# Patient Record
Sex: Female | Born: 1945 | Race: White | Hispanic: No | Smoking: Former smoker
Health system: Southern US, Community
[De-identification: ages and names within clinical notes are randomized; demographics above are authoritative.]

## PROBLEM LIST (undated history)

## (undated) DIAGNOSIS — I480 Paroxysmal atrial fibrillation: Secondary | ICD-10-CM

## (undated) DIAGNOSIS — I639 Cerebral infarction, unspecified: Secondary | ICD-10-CM

## (undated) DIAGNOSIS — E785 Hyperlipidemia, unspecified: Secondary | ICD-10-CM

## (undated) DIAGNOSIS — I10 Essential (primary) hypertension: Secondary | ICD-10-CM

## (undated) DIAGNOSIS — I5030 Unspecified diastolic (congestive) heart failure: Secondary | ICD-10-CM

## (undated) DIAGNOSIS — I4510 Unspecified right bundle-branch block: Secondary | ICD-10-CM

## (undated) HISTORY — DX: Cerebral infarction, unspecified: I63.9

## (undated) HISTORY — DX: Unspecified right bundle-branch block: I45.10

## (undated) HISTORY — DX: Paroxysmal atrial fibrillation: I48.0

## (undated) HISTORY — DX: Essential (primary) hypertension: I10

## (undated) HISTORY — DX: Hyperlipidemia, unspecified: E78.5

## (undated) HISTORY — DX: Unspecified diastolic (congestive) heart failure: I50.30

---

## 2021-02-25 ENCOUNTER — Other Ambulatory Visit: Payer: Self-pay

## 2021-02-25 ENCOUNTER — Other Ambulatory Visit (HOSPITAL_COMMUNITY): Payer: Medicare Other

## 2021-02-25 ENCOUNTER — Emergency Department (HOSPITAL_COMMUNITY): Payer: Medicare Other

## 2021-02-25 ENCOUNTER — Encounter (HOSPITAL_COMMUNITY): Payer: Self-pay | Admitting: Emergency Medicine

## 2021-02-25 ENCOUNTER — Inpatient Hospital Stay (HOSPITAL_COMMUNITY)
Admission: EM | Admit: 2021-02-25 | Discharge: 2021-03-07 | DRG: 023 | Disposition: A | Discharge status: Rehabilitation Facility | Payer: Medicare Other | Attending: Neurology | Admitting: Neurology

## 2021-02-25 ENCOUNTER — Emergency Department (HOSPITAL_COMMUNITY): Payer: Medicare Other | Admitting: Anesthesiology

## 2021-02-25 ENCOUNTER — Encounter (HOSPITAL_COMMUNITY)
Admission: EM | Disposition: A | Discharge status: Rehabilitation Facility | Payer: Self-pay | Source: Home / Self Care | Attending: Neurology

## 2021-02-25 DIAGNOSIS — F802 Mixed receptive-expressive language disorder: Secondary | ICD-10-CM | POA: Diagnosis present

## 2021-02-25 DIAGNOSIS — I5031 Acute diastolic (congestive) heart failure: Secondary | ICD-10-CM

## 2021-02-25 DIAGNOSIS — I081 Rheumatic disorders of both mitral and tricuspid valves: Secondary | ICD-10-CM | POA: Diagnosis present

## 2021-02-25 DIAGNOSIS — E782 Mixed hyperlipidemia: Secondary | ICD-10-CM

## 2021-02-25 DIAGNOSIS — R509 Fever, unspecified: Secondary | ICD-10-CM | POA: Diagnosis not present

## 2021-02-25 DIAGNOSIS — Z20822 Contact with and (suspected) exposure to covid-19: Secondary | ICD-10-CM | POA: Diagnosis present

## 2021-02-25 DIAGNOSIS — Z8249 Family history of ischemic heart disease and other diseases of the circulatory system: Secondary | ICD-10-CM

## 2021-02-25 DIAGNOSIS — Z6831 Body mass index (BMI) 31.0-31.9, adult: Secondary | ICD-10-CM

## 2021-02-25 DIAGNOSIS — D72829 Elevated white blood cell count, unspecified: Secondary | ICD-10-CM | POA: Diagnosis not present

## 2021-02-25 DIAGNOSIS — I480 Paroxysmal atrial fibrillation: Secondary | ICD-10-CM

## 2021-02-25 DIAGNOSIS — Z7901 Long term (current) use of anticoagulants: Secondary | ICD-10-CM | POA: Diagnosis not present

## 2021-02-25 DIAGNOSIS — I63512 Cerebral infarction due to unspecified occlusion or stenosis of left middle cerebral artery: Principal | ICD-10-CM | POA: Diagnosis present

## 2021-02-25 DIAGNOSIS — I952 Hypotension due to drugs: Secondary | ICD-10-CM | POA: Diagnosis not present

## 2021-02-25 DIAGNOSIS — R29709 NIHSS score 9: Secondary | ICD-10-CM | POA: Diagnosis not present

## 2021-02-25 DIAGNOSIS — Z888 Allergy status to other drugs, medicaments and biological substances status: Secondary | ICD-10-CM

## 2021-02-25 DIAGNOSIS — R4701 Aphasia: Secondary | ICD-10-CM | POA: Diagnosis present

## 2021-02-25 DIAGNOSIS — R233 Spontaneous ecchymoses: Secondary | ICD-10-CM | POA: Diagnosis not present

## 2021-02-25 DIAGNOSIS — R1312 Dysphagia, oropharyngeal phase: Secondary | ICD-10-CM | POA: Diagnosis not present

## 2021-02-25 DIAGNOSIS — I6340 Cerebral infarction due to embolism of unspecified cerebral artery: Secondary | ICD-10-CM | POA: Diagnosis not present

## 2021-02-25 DIAGNOSIS — I6389 Other cerebral infarction: Secondary | ICD-10-CM | POA: Diagnosis not present

## 2021-02-25 DIAGNOSIS — I9589 Other hypotension: Secondary | ICD-10-CM

## 2021-02-25 DIAGNOSIS — I4819 Other persistent atrial fibrillation: Secondary | ICD-10-CM | POA: Diagnosis present

## 2021-02-25 DIAGNOSIS — Z880 Allergy status to penicillin: Secondary | ICD-10-CM

## 2021-02-25 DIAGNOSIS — I493 Ventricular premature depolarization: Secondary | ICD-10-CM | POA: Diagnosis not present

## 2021-02-25 DIAGNOSIS — I4892 Unspecified atrial flutter: Secondary | ICD-10-CM | POA: Diagnosis present

## 2021-02-25 DIAGNOSIS — I4891 Unspecified atrial fibrillation: Secondary | ICD-10-CM | POA: Diagnosis not present

## 2021-02-25 DIAGNOSIS — R29704 NIHSS score 4: Secondary | ICD-10-CM | POA: Diagnosis present

## 2021-02-25 DIAGNOSIS — Z79899 Other long term (current) drug therapy: Secondary | ICD-10-CM | POA: Diagnosis not present

## 2021-02-25 DIAGNOSIS — I4510 Unspecified right bundle-branch block: Secondary | ICD-10-CM | POA: Diagnosis present

## 2021-02-25 DIAGNOSIS — Z886 Allergy status to analgesic agent status: Secondary | ICD-10-CM

## 2021-02-25 DIAGNOSIS — I639 Cerebral infarction, unspecified: Secondary | ICD-10-CM | POA: Diagnosis present

## 2021-02-25 DIAGNOSIS — I10 Essential (primary) hypertension: Secondary | ICD-10-CM | POA: Diagnosis not present

## 2021-02-25 DIAGNOSIS — Z8673 Personal history of transient ischemic attack (TIA), and cerebral infarction without residual deficits: Secondary | ICD-10-CM

## 2021-02-25 DIAGNOSIS — I110 Hypertensive heart disease with heart failure: Secondary | ICD-10-CM | POA: Diagnosis present

## 2021-02-25 DIAGNOSIS — J449 Chronic obstructive pulmonary disease, unspecified: Secondary | ICD-10-CM | POA: Diagnosis present

## 2021-02-25 DIAGNOSIS — E669 Obesity, unspecified: Secondary | ICD-10-CM | POA: Diagnosis present

## 2021-02-25 DIAGNOSIS — R1310 Dysphagia, unspecified: Secondary | ICD-10-CM | POA: Diagnosis present

## 2021-02-25 DIAGNOSIS — I6602 Occlusion and stenosis of left middle cerebral artery: Secondary | ICD-10-CM | POA: Diagnosis not present

## 2021-02-25 DIAGNOSIS — R059 Cough, unspecified: Secondary | ICD-10-CM | POA: Diagnosis present

## 2021-02-25 DIAGNOSIS — I483 Typical atrial flutter: Secondary | ICD-10-CM

## 2021-02-25 DIAGNOSIS — Z87891 Personal history of nicotine dependence: Secondary | ICD-10-CM

## 2021-02-25 DIAGNOSIS — E7800 Pure hypercholesterolemia, unspecified: Secondary | ICD-10-CM | POA: Diagnosis not present

## 2021-02-25 DIAGNOSIS — Z823 Family history of stroke: Secondary | ICD-10-CM

## 2021-02-25 HISTORY — PX: RADIOLOGY WITH ANESTHESIA: SHX6223

## 2021-02-25 HISTORY — PX: IR PERCUTANEOUS ART THROMBECTOMY/INFUSION INTRACRANIAL INC DIAG ANGIO: IMG6087

## 2021-02-25 HISTORY — PX: IR CT HEAD LTD: IMG2386

## 2021-02-25 LAB — DIFFERENTIAL
Abs Immature Granulocytes: 0.04 10*3/uL (ref 0.00–0.07)
Basophils Absolute: 0.1 10*3/uL (ref 0.0–0.1)
Basophils Relative: 1 %
Eosinophils Absolute: 0.1 10*3/uL (ref 0.0–0.5)
Eosinophils Relative: 1 %
Immature Granulocytes: 0 %
Lymphocytes Relative: 25 %
Lymphs Abs: 2.5 10*3/uL (ref 0.7–4.0)
Monocytes Absolute: 1 10*3/uL (ref 0.1–1.0)
Monocytes Relative: 11 %
Neutro Abs: 6.2 10*3/uL (ref 1.7–7.7)
Neutrophils Relative %: 62 %

## 2021-02-25 LAB — COMPREHENSIVE METABOLIC PANEL
ALT: 14 U/L (ref 0–44)
AST: 20 U/L (ref 15–41)
Albumin: 3.9 g/dL (ref 3.5–5.0)
Alkaline Phosphatase: 56 U/L (ref 38–126)
Anion gap: 8 (ref 5–15)
BUN: 21 mg/dL (ref 8–23)
CO2: 26 mmol/L (ref 22–32)
Calcium: 8.8 mg/dL — ABNORMAL LOW (ref 8.9–10.3)
Chloride: 107 mmol/L (ref 98–111)
Creatinine, Ser: 0.89 mg/dL (ref 0.44–1.00)
GFR, Estimated: >60 mL/min (ref >60)
Glucose, Bld: 113 mg/dL — ABNORMAL HIGH (ref 70–99)
Potassium: 3.4 mmol/L — ABNORMAL LOW (ref 3.5–5.1)
Sodium: 141 mmol/L (ref 135–145)
Total Bilirubin: 0.6 mg/dL (ref 0.3–1.2)
Total Protein: 6.8 g/dL (ref 6.5–8.1)

## 2021-02-25 LAB — CBC
HCT: 43.6 % (ref 36.0–46.0)
Hemoglobin: 13.6 g/dL (ref 12.0–15.0)
MCH: 29.9 pg (ref 26.0–34.0)
MCHC: 31.2 g/dL (ref 30.0–36.0)
MCV: 95.8 fL (ref 80.0–100.0)
Platelets: 225 10*3/uL (ref 150–400)
RBC: 4.55 MIL/uL (ref 3.87–5.11)
RDW: 13.8 % (ref 11.5–15.5)
WBC: 9.9 10*3/uL (ref 4.0–10.5)
nRBC: 0 % (ref 0.0–0.2)

## 2021-02-25 LAB — CBG MONITORING, ED: Glucose-Capillary: 99 mg/dL (ref 70–99)

## 2021-02-25 LAB — RESP PANEL BY RT-PCR (FLU A&B, COVID) ARPGX2
Influenza A by PCR: NEGATIVE
Influenza B by PCR: NEGATIVE
SARS Coronavirus 2 by RT PCR: NEGATIVE

## 2021-02-25 LAB — ETHANOL: Alcohol, Ethyl (B): <10 mg/dL (ref <10)

## 2021-02-25 LAB — PROTIME-INR
INR: 1 (ref 0.8–1.2)
Prothrombin Time: 13 seconds (ref 11.4–15.2)

## 2021-02-25 LAB — MRSA NEXT GEN BY PCR, NASAL: MRSA by PCR Next Gen: NOT DETECTED

## 2021-02-25 LAB — APTT: aPTT: 23 seconds — ABNORMAL LOW (ref 24–36)

## 2021-02-25 SURGERY — IR WITH ANESTHESIA
Anesthesia: General

## 2021-02-25 MED ORDER — LIDOCAINE 2% (20 MG/ML) 5 ML SYRINGE
INTRAMUSCULAR | Status: DC | PRN
  Administered 2021-02-25: 60 mg via INTRAVENOUS

## 2021-02-25 MED ORDER — IOHEXOL 300 MG/ML  SOLN
100.0000 mL | Freq: Once | INTRAMUSCULAR | Status: AC | PRN
  Administered 2021-02-25: 96 mL via INTRA_ARTERIAL

## 2021-02-25 MED ORDER — MOMETASONE FURO-FORMOTEROL FUM 100-5 MCG/ACT IN AERO
2.0000 | INHALATION_SPRAY | Freq: Two times a day (BID) | RESPIRATORY_TRACT | Status: DC
  Administered 2021-02-25 – 2021-03-06 (×16): 2 via RESPIRATORY_TRACT
  Filled 2021-02-25 (×2): qty 8.8

## 2021-02-25 MED ORDER — ACETAMINOPHEN 160 MG/5ML PO SOLN: 650.0000 mg | ORAL | Status: DC | PRN

## 2021-02-25 MED ORDER — CLEVIDIPINE BUTYRATE 0.5 MG/ML IV EMUL
0.0000 mg/h | INTRAVENOUS | Status: DC
  Administered 2021-02-25: 2 mg/h via INTRAVENOUS
  Filled 2021-02-25: qty 50

## 2021-02-25 MED ORDER — TIOTROPIUM BROMIDE MONOHYDRATE 18 MCG IN CAPS
18.0000 ug | ORAL_CAPSULE | Freq: Every day | RESPIRATORY_TRACT | Status: DC
  Filled 2021-02-25: qty 5

## 2021-02-25 MED ORDER — ONDANSETRON HCL 4 MG/2ML IJ SOLN
INTRAMUSCULAR | Status: DC | PRN
  Administered 2021-02-25: 4 mg via INTRAVENOUS

## 2021-02-25 MED ORDER — SUCCINYLCHOLINE CHLORIDE 200 MG/10ML IV SOSY
PREFILLED_SYRINGE | INTRAVENOUS | Status: DC | PRN
  Administered 2021-02-25: 120 mg via INTRAVENOUS

## 2021-02-25 MED ORDER — LOSARTAN POTASSIUM 50 MG PO TABS
25.0000 mg | ORAL_TABLET | Freq: Every day | ORAL | Status: DC
  Filled 2021-02-25: qty 1

## 2021-02-25 MED ORDER — VANCOMYCIN HCL IN DEXTROSE 1-5 GM/200ML-% IV SOLN
INTRAVENOUS | Status: AC
  Filled 2021-02-25: qty 200

## 2021-02-25 MED ORDER — LABETALOL HCL 5 MG/ML IV SOLN
10.0000 mg | INTRAVENOUS | Status: DC | PRN
  Administered 2021-02-25: 10 mg via INTRAVENOUS
  Filled 2021-02-25: qty 4

## 2021-02-25 MED ORDER — SODIUM CHLORIDE 0.9 % IV SOLN: INTRAVENOUS | Status: DC

## 2021-02-25 MED ORDER — IOHEXOL 350 MG/ML SOLN
100.0000 mL | Freq: Once | INTRAVENOUS | Status: AC | PRN
  Administered 2021-02-25: 100 mL via INTRAVENOUS

## 2021-02-25 MED ORDER — SUGAMMADEX SODIUM 200 MG/2ML IV SOLN
INTRAVENOUS | Status: DC | PRN
  Administered 2021-02-25: 200 mg via INTRAVENOUS

## 2021-02-25 MED ORDER — ATORVASTATIN CALCIUM 10 MG PO TABS
10.0000 mg | ORAL_TABLET | Freq: Every day | ORAL | Status: DC
  Administered 2021-02-26 – 2021-03-07 (×10): 10 mg via ORAL
  Filled 2021-02-25 (×10): qty 1

## 2021-02-25 MED ORDER — FENTANYL CITRATE (PF) 100 MCG/2ML IJ SOLN
INTRAMUSCULAR | Status: DC | PRN
  Administered 2021-02-25 (×2): 50 ug via INTRAVENOUS

## 2021-02-25 MED ORDER — PROPOFOL 10 MG/ML IV BOLUS
INTRAVENOUS | Status: DC | PRN
  Administered 2021-02-25: 100 mg via INTRAVENOUS

## 2021-02-25 MED ORDER — CHLORHEXIDINE GLUCONATE CLOTH 2 % EX PADS
6.0000 | MEDICATED_PAD | Freq: Every day | CUTANEOUS | Status: DC
  Administered 2021-02-25 – 2021-03-06 (×8): 6 via TOPICAL

## 2021-02-25 MED ORDER — FENTANYL CITRATE (PF) 100 MCG/2ML IJ SOLN
INTRAMUSCULAR | Status: AC
  Filled 2021-02-25: qty 2

## 2021-02-25 MED ORDER — STROKE: EARLY STAGES OF RECOVERY BOOK: Freq: Once | Status: AC

## 2021-02-25 MED ORDER — ALBUTEROL SULFATE (2.5 MG/3ML) 0.083% IN NEBU
2.5000 mg | INHALATION_SOLUTION | RESPIRATORY_TRACT | Status: DC | PRN
  Administered 2021-03-01 – 2021-03-05 (×2): 2.5 mg via RESPIRATORY_TRACT
  Filled 2021-02-25 (×2): qty 3

## 2021-02-25 MED ORDER — ROCURONIUM BROMIDE 10 MG/ML (PF) SYRINGE
PREFILLED_SYRINGE | INTRAVENOUS | Status: DC | PRN
  Administered 2021-02-25: 50 mg via INTRAVENOUS
  Administered 2021-02-25: 10 mg via INTRAVENOUS

## 2021-02-25 MED ORDER — LACTATED RINGERS IV SOLN: INTRAVENOUS | Status: DC | PRN

## 2021-02-25 MED ORDER — ACETAMINOPHEN 325 MG PO TABS
650.0000 mg | ORAL_TABLET | ORAL | Status: DC | PRN
  Administered 2021-03-01: 650 mg via ORAL
  Filled 2021-02-25: qty 2

## 2021-02-25 MED ORDER — CLEVIDIPINE BUTYRATE 0.5 MG/ML IV EMUL
INTRAVENOUS | Status: AC
  Filled 2021-02-25: qty 50

## 2021-02-25 MED ORDER — UMECLIDINIUM BROMIDE 62.5 MCG/INH IN AEPB
1.0000 | INHALATION_SPRAY | Freq: Every day | RESPIRATORY_TRACT | Status: DC
  Administered 2021-02-26 – 2021-03-04 (×7): 1 via RESPIRATORY_TRACT
  Filled 2021-02-25: qty 7

## 2021-02-25 MED ORDER — PANTOPRAZOLE SODIUM 40 MG IV SOLR
40.0000 mg | Freq: Every day | INTRAVENOUS | Status: DC
  Administered 2021-02-25: 40 mg via INTRAVENOUS
  Filled 2021-02-25: qty 40

## 2021-02-25 MED ORDER — DEXAMETHASONE SODIUM PHOSPHATE 10 MG/ML IJ SOLN
INTRAMUSCULAR | Status: DC | PRN
  Administered 2021-02-25: 10 mg via INTRAVENOUS

## 2021-02-25 MED ORDER — TENECTEPLASE FOR STROKE: 0.2500 mg/kg | PACK | Freq: Once | INTRAVENOUS | Status: AC

## 2021-02-25 MED ORDER — VANCOMYCIN HCL 1000 MG IV SOLR
INTRAVENOUS | Status: DC | PRN
  Administered 2021-02-25: 1000 mg via INTRAVENOUS

## 2021-02-25 MED ORDER — NITROGLYCERIN 1 MG/10 ML FOR IR/CATH LAB
INTRA_ARTERIAL | Status: AC
  Filled 2021-02-25: qty 10

## 2021-02-25 MED ORDER — PHENYLEPHRINE 40 MCG/ML (10ML) SYRINGE FOR IV PUSH (FOR BLOOD PRESSURE SUPPORT)
PREFILLED_SYRINGE | INTRAVENOUS | Status: DC | PRN
  Administered 2021-02-25: 160 ug via INTRAVENOUS

## 2021-02-25 MED ORDER — PHENYLEPHRINE HCL-NACL 20-0.9 MG/250ML-% IV SOLN
INTRAVENOUS | Status: DC | PRN
  Administered 2021-02-25: 25 ug/min via INTRAVENOUS

## 2021-02-25 MED ORDER — TENECTEPLASE FOR STROKE: 0.2500 mg/kg | PACK | Freq: Once | INTRAVENOUS | Status: DC

## 2021-02-25 MED ORDER — SENNOSIDES-DOCUSATE SODIUM 8.6-50 MG PO TABS: 1.0000 | ORAL_TABLET | Freq: Every evening | ORAL | Status: DC | PRN

## 2021-02-25 MED ORDER — ACETAMINOPHEN 325 MG PO TABS: 650.0000 mg | ORAL_TABLET | ORAL | Status: DC | PRN

## 2021-02-25 MED ORDER — ACETAMINOPHEN 650 MG RE SUPP: 650.0000 mg | RECTAL | Status: DC | PRN

## 2021-02-25 MED ORDER — TENECTEPLASE FOR STROKE
PACK | INTRAVENOUS | Status: AC
  Administered 2021-02-25: 23 mg via INTRAVENOUS
  Filled 2021-02-25: qty 10

## 2021-02-25 MED ORDER — CLEVIDIPINE BUTYRATE 0.5 MG/ML IV EMUL
0.0000 mg/h | INTRAVENOUS | Status: AC
  Administered 2021-02-25: 2 mg/h via INTRAVENOUS
  Administered 2021-02-25: 4 mg/h via INTRAVENOUS
  Administered 2021-02-26: 6 mg/h via INTRAVENOUS
  Administered 2021-02-26: 8 mg/h via INTRAVENOUS
  Filled 2021-02-25 (×4): qty 50

## 2021-02-25 MED ORDER — SODIUM CHLORIDE (PF) 0.9 % IJ SOLN
INTRAVENOUS | Status: DC | PRN
  Administered 2021-02-25 (×3): 25 ug via INTRA_ARTERIAL

## 2021-02-25 NOTE — ED Provider Notes (Signed)
  Physical Exam  BP (!) 180/90   Pulse 86   Temp 98.7 F (37.1 C) (Oral)   Resp 18   Ht 5\' 7"  (1.702 m)   Wt 90.2 kg   SpO2 96%   BMI 31.15 kg/m   Physical Exam  ED Course/Procedures     .Critical Care Performed by: , MD Authorized by: Derwood Kaplan, MD   Critical care provider statement:    Critical care time (minutes):  31   Critical care time was exclusive of:  Separately billable procedures and treating other patients   Critical care was necessary to treat or prevent imminent or life-threatening deterioration of the following conditions:  CNS failure or compromise and circulatory failure   Critical care was time spent personally by me on the following activities:  Development of treatment plan with patient or surrogate, discussions with consultants, evaluation of patient's response to treatment, examination of patient, re-evaluation of patient's condition and pulse oximetry   I assumed direction of critical care for this patient from another provider in my specialty: no     Care discussed with: accepting provider at another facility    MDM   Assuming care of patient from Dr. Derwood Kaplan at 3:40 pm   Patient in the ED for aphasia, LKN: 1:30 pm. Concerns for stroke - TNK given. CT-A pending.  According to Dr. Wallace Cullens, plan is to f/u on Ct-A. Will need BP closely monitored as well.   Patient had no complains, no concerns from the nursing side. Will continue to monitor.  4:17 PM Reviewed the CT angiogram independently.  Discussed the case with neurologist as well.  Dr. Wallace Cullens, informed me that patient will need stat transfer to [facility] for clot extraction from M2 distribution.  Reassessed the patient.  Her BP is 182/95.  IV labetalol to be administered by nurse.  Requested that Cleviprex be at the bedside available as well. Patient's son is at the bedside now.  We have updated him of the   [facility] unable to transfer the patient immediately. Requested  [facility] to call EMS service, they will now take the patient to [facility].  Dr. [facility] at [facility] made aware along with the charge nurse.          [facility], MD 02/25/21 [phone]

## 2021-02-25 NOTE — Progress Notes (Signed)
Patient ID: Michelle Dean, female   DOB: July 13, 1945, 75 y.o.   MRN: [id number] INR 74 Y RT H F LSW 1pm  MRS 0. New onset aphasia .  CT brain No ICH ,. 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 CTP  core of 17ml with mismatch of 55ml . NIH of 9. Option of endovascular treatment  of occluded artery D/W son via a 3 way call . Procedure,reasons and alternatives reviewed.  Risks of ICH of 10 % ,worsening neuro deficit ,death ,inability  to revascularize reviewed. Son expressed understanding and gave consent to the treatment. S.Lajuana Patchell MD  .

## 2021-02-25 NOTE — Anesthesia Preprocedure Evaluation (Signed)
Anesthesia Evaluation  Patient identified by MRN, date of birth, ID band Patient confused    Reviewed: Allergy & Precautions, H&P , NPO status , Patient's Chart, lab work & pertinent test results  Airway Mallampati: II  TM Distance: >3 FB Neck ROM: Full    Dental no notable dental hx. (+) Edentulous Upper, Edentulous Lower, Dental Advisory Given   Pulmonary neg pulmonary ROS,    Pulmonary exam normal breath sounds clear to auscultation       Cardiovascular hypertension,  Rhythm:Regular Rate:Normal     Neuro/Psych CVA, Residual Symptoms negative psych ROS   GI/Hepatic negative GI ROS, Neg liver ROS,   Endo/Other  negative endocrine ROS  Renal/GU negative Renal ROS  negative genitourinary   Musculoskeletal   Abdominal   Peds  Hematology negative hematology ROS (+)   Anesthesia Other Findings   Reproductive/Obstetrics negative OB ROS                             Anesthesia Physical Anesthesia Plan  ASA: 3 and emergent  Anesthesia Plan: General   Post-op Pain Management:    Induction: Intravenous, Rapid sequence and Cricoid pressure planned  PONV Risk Score and Plan: 4 or greater and Ondansetron, Dexamethasone and Treatment may vary due to age or medical condition  Airway Management Planned: Oral ETT  Additional Equipment: Arterial line  Intra-op Plan:   Post-operative Plan: Extubation in OR and Possible Post-op intubation/ventilation  Informed Consent: I have reviewed the patients History and Physical, chart, labs and discussed the procedure including the risks, benefits and alternatives for the proposed anesthesia with the patient or authorized representative who has indicated his/her understanding and acceptance.     Dental advisory given  Plan Discussed with: CRNA  Anesthesia Plan Comments:         Anesthesia Quick Evaluation

## 2021-02-25 NOTE — ED Triage Notes (Signed)
REMS for stroke . Pt was at sons house and became confused around 1pm. LKW 1pm per EMS.

## 2021-02-25 NOTE — Code Documentation (Signed)
Stroke Response Nurse Documentation Code Documentation  Addendum   Stroke team at the bedside on patient arrival from [facility] via [facility].   Patient alert, difficult to communicate due to expressive aphasia, BP 140/85.  Patient to IR bay, jewelry removed and placed in patient belonging bag with patient sticker. Report given to IR RN.    Scarlette Slice K  Stroke Response RN

## 2021-02-25 NOTE — Anesthesia Procedure Notes (Signed)
Procedure Name: Intubation Date/Time: 02/25/2021 5:17 PM Performed by: Barrington Ellison, CRNA Pre-anesthesia Checklist: Patient identified, Emergency Drugs available, Suction available and Patient being monitored Patient Re-evaluated:Patient Re-evaluated prior to induction Oxygen Delivery Method: Circle System Utilized Preoxygenation: Pre-oxygenation with 100% oxygen Induction Type: IV induction, Rapid sequence and Cricoid Pressure applied Laryngoscope Size: Mac and 3 Grade View: Grade I Tube type: Oral Tube size: 7.5 mm Number of attempts: 1 Airway Equipment and Method: Stylet and Oral airway Placement Confirmation: ETT inserted through vocal cords under direct vision, positive ETCO2 and breath sounds checked- equal and bilateral Secured at: 21 cm Tube secured with: Tape Dental Injury: Teeth and Oropharynx as per pre-operative assessment

## 2021-02-25 NOTE — Procedures (Signed)
S/P Lt common carotid arteriogram RT CFA approach. Occluded LT MCA inf division occlusion at the Lt M2 M3 junction. Partial recanalization with reocclusion with x 1 pass with 40 mm solitaireX retriever and contact aspiration,x1 pass with contact aspiration and x1 pass with mechanical thrombolysis  acieving a TICI 2C revascularization. Post CT mod hyper attenuation in the Lt ant sylvian fissure and the fronto parietal subarachnoid space . No mass effect. 4F angioseal and quick clot applied for hemostasis at the RT CFAS site, Distal pulses all present  Patient extubated.Mainting O2 sats. Pupils 54mm Rt = Lt  No facial asymmetry. Tongue midline. RT groin soft. Distal pulses intact. S.Michelle Fittro MD Not following simple commands. Moves all 4s equally.  S.Michelle Repsher MD

## 2021-02-25 NOTE — Consult Note (Signed)
[facility] Telemedicine Consult   Requesting Provider: Thereasa Solo Consult Participants: Myself, [facility] nurse Deanna, Bedside nurse Bonita Quin, Son Juanito Doom Location of the provider: [facility] Location of the patient: [facility]  This consult was provided via telemedicine with 2-way video and audio communication. The patient/family was informed that care would be provided in this way and agreed to receive care in this manner.    Chief Complaint: Aphasia  HPI: This is a 75 year old woman with past medical history significant for COPD (intermittently uses oxygen at home), hypertension, hyperlipidemia, presenting with acute onset difficulty understanding speech.  Given her substantial aphasia, history is obtained primarily from her son at bedside, there is no information accessible to me in her chart at this time.  He reports that he had a conversation with her at 10:41 AM and she was in her normal state of health, she then came to his home at about 1 PM, driving herself with a gift for her grandson and a loaf of bread.  Initially her speech was completely normal but then suddenly she became very confused and could not understand what he was telling her about where some of the patient's grandsons belongings were.  This was a sudden acute and witnessed change with last known well at 1 PM.  On review of systems is obtained from the son who notes that she frequently will not use her oxygen when she is supposed to, for example doing things like mowing her lawn without her oxygen in place, and in that setting she will sometimes have headaches.  Otherwise denies any acute complaints on full review of systems.  Denies any contraindications to [facility] administration and expresses understanding of the risk of hemorrhage.  LKW: 1 PM TNK given?:  Yes, after full review of risks and benefits with the son and confirmation of no exclusion criteria, given at 3:29 PM IR Thrombectomy? Yes,  transferring to [facility] Modified Rankin Scale: 0-Completely asymptomatic and back to baseline post- stroke Time of teleneurologist evaluation: 2:52 PM  Exam: Current vital signs: BP (!) 161/107   Pulse 86   Temp 98.7 F (37.1 C) (Oral)   Resp (!) 25   Ht 5\' 7"  (1.702 m)   Wt 90.2 kg   SpO2 97%   BMI 31.15 kg/m  Vital signs in last 24 hours: Temp:  [98.7 F (37.1 C)] 98.7 F (37.1 C) (10/17 1451) Pulse Rate:  [86] 86 (10/17 1500) Resp:  [25] 25 (10/17 1500) BP: (161)/(107) 161/107 (10/17 1500) SpO2:  [97 %] 97 % (10/17 1500) Weight:  [90.2 kg-99.8 kg] 90.2 kg (10/17 1514)    General: Alert, comfortable, interactive with examiners but clearly not understanding verbal communication Pulmonary: breathing comfortably Cardiac: regular rate and rhythm on monitor   NIH Stroke scale 1A: Level of Consciousness - 0 1B: Ask Month and Age - 2 1C: 'Blink Eyes' & 'Squeeze Hands' - 2 2: Test Horizontal Extraocular Movements - 0 3: Test Visual Fields - 0 to orienting to stimuli, blinking to threat, difficult to assess given aphasia and tele-exam 4: Test Facial Palsy - 0 5A: Test Left Arm Motor Drift - 0 5B: Test Right Arm Motor Drift - 0 6A: Test Left Leg Motor Drift - 1 6B: Test Right Leg Motor Drift - 1  7: Test Limb Ataxia - 0  8: Test Sensation - 0 9: Test Language/Aphasia- 2 10: Test Dysarthria - 0 11: Test Extinction/Inattention - 0 NIHSS score: 9 Regarding her speech, she will occasionally spontaneously say  short sentences such as "who is going to take care of my dog" and "what is that" but she has a dense receptive aphasia.  Her speech is not slurred when she does speak   Imaging Reviewed:  Head CT without acute intracranial process, ASPECTS 10, personally reviewed and agree with neuroradiology CTA  with  Labs reviewed in epic and pertinent values follow:  Basic Metabolic Panel: Recent Labs  Lab 02/25/21 1441  NA 141  K 3.4*  CL 107  CO2 26  GLUCOSE 113*  BUN 21   CREATININE 0.89  CALCIUM 8.8*    CBC: Recent Labs  Lab 02/25/21 1441  WBC 9.9  NEUTROABS 6.2  HGB 13.6  HCT 43.6  MCV 95.8  PLT 225    Coagulation Studies: Recent Labs    02/25/21 1441  LABPROT 13.0  INR 1.0       Assessment: Acute embolic stroke  Recommendations:  #Left MCA distal M2 stroke, currently embolic of undetermined etiology - Emergent transfer to [facility] for thrombectomy, consent obtained from son via three-way conversation with myself, Dr. Corliss Skains and Mr. Juanito Doom [phone]) - Risk factor modification - Telemetry monitoring - Blood pressure goal   - Post TNK pending procedure < 180/105  - Post successful uncomplicated revascularization SBP 120 - 140 for 24 hours; if complications have arisen or only partial revascularization reach out to interventionalist or neurologist on call for BP goal - PT consult, OT consult, Speech consult, unless patient is back to baseline - Stroke team to follow  This patient is receiving care for possible acute neurological changes. There was 85 minutes of care by this provider at the time of service, including time for direct evaluation via telemedicine, review of medical records, imaging studies and discussion of findings with providers, the patient and/or family.  Brooke Dare MD-PhD [facility] [phone] If 8pm-8am, please page neurology on call as listed in AMION.  CRITICAL CARE Performed by: Gordy Councilman Total critical care time: 85 minutes Critical care time was exclusive of separately billable procedures and treating other patients. Critical care was necessary to treat or prevent imminent or life-threatening deterioration. Critical care was time spent personally by me on the following activities: development of treatment plan with patient and/or surrogate as well as nursing, discussions with consultants, evaluation of patient's response to treatment, examination of patient,  obtaining history from patient or surrogate, ordering and performing treatments and interventions, ordering and review of laboratory studies, ordering and review of radiographic studies, pulse oximetry and re-evaluation of patient's condition.

## 2021-02-25 NOTE — H&P (Signed)
Admission H&P    Chief Complaint: Acute onset of expressive and receptive aphasia  HPI: Michelle Dean is an 75 y.o. female with a PMHx of COPD, HTN and HLD who presented to the [facility] this afternoon for evaluation of acute onset confusion. She was at her son's house at symptom onset. LKN was 1 PM. Teleneurology evaluated the patient and exam findings were most consistent with an aphasia. CT head showed no hemorrhage and there were no contraindications to TNK, which was administered at the [facility]. CTA revealed a left M2/M3 occlusion and decision was made to transfer to [facility] [address] for 4 vessel angiogram and possible thrombectormy. On arrival to the [facility] ED, the patient continued to be aphasic.   Dr. Rollene Fare teleneurology note was reviewed: "This is a 75 year old woman with past medical history significant for COPD (intermittently uses oxygen at home), hypertension, hyperlipidemia, presenting with acute onset difficulty understanding speech.  Given her substantial aphasia, history is obtained primarily from her son at bedside, there is no information accessible to me in her chart at this time. He reports that he had a conversation with her at 10:41 AM and she was in her normal state of health, she then came to his home at about 1 PM, driving herself with a gift for her grandson and a loaf of bread.  Initially her speech was completely normal but then suddenly she became very confused and could not understand what he was telling her about where some of the patient's grandsons belongings were.  This was a sudden acute and witnessed change with last known well at 1 PM. On review of systems is obtained from the son who notes that she frequently will not use her oxygen when she is supposed to, for example doing things like mowing her lawn without her oxygen in place, and in that setting she will sometimes have headaches.  Otherwise denies any acute complaints on full review of systems.  Denies any contraindications to [facility]  administration and expresses understanding of the risk of hemorrhage."   LSN: 1 PM TNK Given: Yes  PMHx COPD HTN HLD  History reviewed. No pertinent surgical history.  History reviewed. No pertinent family history. Social History:  has no history on file for tobacco use, alcohol use, and drug use.  Allergies:  Allergies  Allergen Reactions   Penicillins Hives and Rash   Lisinopril Cough   No current facility-administered medications on file prior to encounter.   Current Outpatient Medications on File Prior to Encounter  Medication Sig Dispense Refill   albuterol (PROVENTIL) (2.5 MG/3ML) 0.083% nebulizer solution Take 3 mLs (2.5 mg dose) by nebulization every 4 (four) hours as needed for Wheezing.     atorvastatin (LIPITOR) 10 MG tablet Take 10 mg by mouth daily.     carboxymethylcellulose (REFRESH PLUS) 0.5 % SOLN Place 1 drop into both eyes 4 (four) times a day as needed. For dry eyes.     DULERA 100-5 MCG/ACT AERO Inhale 2 puffs into the lungs 2 (two) times daily.     losartan (COZAAR) 25 MG tablet Take 25 mg by mouth daily.     Multiple Vitamin (MULTIVITAMIN) tablet Take by mouth.     omeprazole (PRILOSEC) 20 MG capsule Take 20 mg by mouth daily.     tiotropium (SPIRIVA HANDIHALER) 18 MCG inhalation capsule PLACE 1 DOSE(CAPSULE) IN INHALER AND INHALE ONCE DAILY AS DIRECTED.     doxycycline (VIBRAMYCIN) 100 MG capsule Take 100 mg by mouth 2 (two) times daily. (Patient  not taking: Reported on 02/25/2021)      ROS: The patient is unable to provide a ROS due to her aphasia.   Physical Examination: Blood pressure 140/85, pulse 89, temperature 98.9 F (37.2 C), temperature source Oral, resp. rate 19, height 5\' 7"  (1.702 m), weight 90.2 kg, SpO2 96 %.  HEENT-  [place]/AT  Cardiovascular - RRR Lungs - CTA Abdomen - Nondistended Extremities - No edema  Neurologic Examination: Mental Status: Awake and alert. Dense receptive and expressive aphasia. Does not exhibit comprehension  of any verbal commands - could perform about 20% commands after pantomiming. Perseverative 1-2 word phrases in reply to some questions, having no relationship to the questions asked - the replies are non-dysarthric. No hemineglect noted.  Cranial Nerves: II:  PERRL. Blinks to threat in temporal visual fields of left and right eye.  III,IV, VI: No ptosis. EOMI. No nystagmus. No forced gaze deviation.  V: Reacts to touch bilaterally VII: No facial droop noted.  VIII: Alerts to voice.  IX,X: No hypophonia XI: Symmertric XII: Did not protrude tongue to command Motor: When elevated, BUE do not drift for > 10 seconds. Does not comprehend commands for testing of strength against resistance.  Withdraws BLE equally to noxious. Each lower extremity remains elevated for > 5 seconds without drift after passive elevation by examiner.  Sensory: Reacts to touch x 4 Deep Tendon Reflexes:  2+ bilateral brachioradialis and biceps Plantars: Right: downgoing   Left: downgoing Cerebellar: Unable to formally assess. No gross ataxia with spontaneous movements.  Gait: Unable to assess   Results for orders placed or performed during the hospital encounter of 02/25/21 (from the past 48 hour(s))  CBG monitoring, ED     Status: None   Collection Time: 02/25/21  2:20 PM  Result Value Ref Range   Glucose-Capillary 99 70 - 99 mg/dL    Comment: Glucose reference range applies only to samples taken after fasting for at least 8 hours.  Ethanol     Status: None   Collection Time: 02/25/21  2:41 PM  Result Value Ref Range   Alcohol, Ethyl (B) <10 <10 mg/dL    Comment: (NOTE) Lowest detectable limit for serum alcohol is 10 mg/dL.  For medical purposes only. Performed at [facility], [address], [place], [place]   Protime-INR     Status: None   Collection Time: 02/25/21  2:41 PM  Result Value Ref Range   Prothrombin Time 13.0 11.4 - 15.2 seconds   INR 1.0 0.8 - 1.2    Comment: (NOTE) INR goal  varies based on device and disease states. Performed at [facility], [address], [place], [place]   APTT     Status: Abnormal   Collection Time: 02/25/21  2:41 PM  Result Value Ref Range   aPTT 23 (L) 24 - 36 seconds    Comment: Performed at [facility], [address] [place], [place]  CBC     Status: None   Collection Time: 02/25/21  2:41 PM  Result Value Ref Range   WBC 9.9 4.0 - 10.5 K/uL   RBC 4.55 3.87 - 5.11 MIL/uL   Hemoglobin 13.6 12.0 - 15.0 g/dL   HCT 02/27/21 63.8 - 75.6 %   MCV 95.8 80.0 - 100.0 fL   MCH 29.9 26.0 - 34.0 pg   MCHC 31.2 30.0 - 36.0 g/dL   RDW 43.3 29.5 - 18.8 %   Platelets 225 150 - 400 K/uL  nRBC 0.0 0.0 - 0.2 %    Comment: Performed at [facility], [address], [place], [place] [address]  Differential     Status: None   Collection Time: 02/25/21  2:41 PM  Result Value Ref Range   Neutrophils Relative % 62 %   Neutro Abs 6.2 1.7 - 7.7 K/uL   Lymphocytes Relative 25 %   Lymphs Abs 2.5 0.7 - 4.0 K/uL   Monocytes Relative 11 %   Monocytes Absolute 1.0 0.1 - 1.0 K/uL   Eosinophils Relative 1 %   Eosinophils Absolute 0.1 0.0 - 0.5 K/uL   Basophils Relative 1 %   Basophils Absolute 0.1 0.0 - 0.1 K/uL   Immature Granulocytes 0 %   Abs Immature Granulocytes 0.04 0.00 - 0.07 K/uL    Comment: Performed at [facility], [address], [place], [place] [address]  Comprehensive metabolic panel     Status: Abnormal   Collection Time: 02/25/21  2:41 PM  Result Value Ref Range   Sodium 141 135 - 145 mmol/L   Potassium 3.4 (L) 3.5 - 5.1 mmol/L   Chloride 107 98 - 111 mmol/L   CO2 26 22 - 32 mmol/L   Glucose, Bld 113 (H) 70 - 99 mg/dL    Comment: Glucose reference range applies only to samples taken after fasting for at least 8 hours.   BUN 21 8 - 23 mg/dL   Creatinine, Ser 3.47 0.44 - 1.00 mg/dL   Calcium 8.8 (L) 8.9 - 10.3 mg/dL   Total Protein 6.8 6.5 - 8.1 g/dL   Albumin 3.9 3.5 - 5.0 g/dL   AST 20 15 - 41 U/L   ALT 14 0 -  44 U/L   Alkaline Phosphatase 56 38 - 126 U/L   Total Bilirubin 0.6 0.3 - 1.2 mg/dL   GFR, Estimated >42 >59 mL/min    Comment: (NOTE) Calculated using the CKD-EPI Creatinine Equation (2021)    Anion gap 8 5 - 15    Comment: Performed at [facility], [address], [place], [place] [address]  Resp Panel by RT-PCR (Flu A&B, Covid) Nasopharyngeal Swab     Status: None   Collection Time: 02/25/21  2:55 PM   Specimen: Nasopharyngeal Swab; Nasopharyngeal(NP) swabs in vial transport medium  Result Value Ref Range   SARS Coronavirus 2 by RT PCR NEGATIVE NEGATIVE    Comment: (NOTE) SARS-CoV-2 target nucleic acids are NOT DETECTED.  The SARS-CoV-2 RNA is generally detectable in upper respiratory specimens during the acute phase of infection. The lowest concentration of SARS-CoV-2 viral copies this assay can detect is 138 copies/mL. A negative result does not preclude SARS-Cov-2 infection and should not be used as the sole basis for treatment or other patient management decisions. A negative result may occur with  improper specimen collection/handling, submission of specimen other than nasopharyngeal swab, presence of viral mutation(s) within the areas targeted by this assay, and inadequate number of viral copies(<138 copies/mL). A negative result must be combined with clinical observations, patient history, and epidemiological information. The expected result is Negative.  Fact Sheet for Patients:  BloggerCourse.com  Fact Sheet for Healthcare Providers:  SeriousBroker.it  This test is no t yet approved or cleared by the [place] FDA and  has been authorized for detection and/or diagnosis of SARS-CoV-2 by FDA under an Emergency Use Authorization (EUA). This EUA will remain  in effect (meaning this test can be used) for the duration of the COVID-19 declaration under Section 564(b)(1) of the  Act, 21 U.S.C.section 360bbb-3(b)(1),  unless the authorization is terminated  or revoked sooner.       Influenza A by PCR NEGATIVE NEGATIVE   Influenza B by PCR NEGATIVE NEGATIVE    Comment: (NOTE) The Xpert Xpress SARS-CoV-2/FLU/RSV plus assay is intended as an aid in the diagnosis of influenza from Nasopharyngeal swab specimens and should not be used as a sole basis for treatment. Nasal washings and aspirates are unacceptable for Xpert Xpress SARS-CoV-2/FLU/RSV testing.  Fact Sheet for Patients: BloggerCourse.com  Fact Sheet for Healthcare Providers: SeriousBroker.it  This test is not yet approved or cleared by the [place] FDA and has been authorized for detection and/or diagnosis of SARS-CoV-2 by FDA under an Emergency Use Authorization (EUA). This EUA will remain in effect (meaning this test can be used) for the duration of the COVID-19 declaration under Section 564(b)(1) of the Act, 21 U.S.C. section 360bbb-3(b)(1), unless the authorization is terminated or revoked.  Performed at [facility], [address], [place], [place] [address]    CT HEAD CODE STROKE WO CONTRAST  Result Date: 02/25/2021 CLINICAL DATA:  Code stroke. EXAM: CT HEAD WITHOUT CONTRAST TECHNIQUE: Contiguous axial images were obtained from the base of the skull through the vertex without intravenous contrast. COMPARISON:  None. FINDINGS: Brain: No acute intracranial hemorrhage, mass effect, or edema. Gray-white differentiation is preserved. Age-indeterminate small vessel infarct of the left gangliocapsular region. Additional patchy low-density in the supratentorial white matter is nonspecific but may reflect mild chronic microvascular ischemic changes. Ventricles and sulci are within normal limits in size and configuration. No extra-axial collection. Vascular: No hyperdense vessel. Mild intracranial atherosclerotic calcification at the skull base. Skull: Unremarkable. Sinuses/Orbits: Aerated.  Other: Mastoid air cells are clear. ASPECTS ([place] Stroke Program Early CT Score) - Ganglionic level infarction (caudate, lentiform nuclei, internal capsule, insula, M1-M3 cortex): 7 - Supraganglionic infarction (M4-M6 cortex): 3 Total score (0-10 with 10 being normal): 10 IMPRESSION: There is no acute intracranial hemorrhage or evidence of acute infarction. ASPECT score is 10. Age-indeterminate small vessel infarct of the left gangliocapsular region. These results were called by telephone at the time of interpretation on 02/25/2021 at 2:37 pm to provider Tanda Rockers , who verbally acknowledged these results. Electronically Signed   By: Guadlupe Spanish M.D.   On: 02/25/2021 14:39   CT ANGIO HEAD NECK W WO CM W PERF (CODE STROKE)  Result Date: 02/25/2021 CLINICAL DATA:  Neuro deficit, acute, stroke suspected EXAM: CT ANGIOGRAPHY HEAD AND NECK CT PERFUSION BRAIN TECHNIQUE: Multidetector CT imaging of the head and neck was performed using the standard protocol during bolus administration of intravenous contrast. Multiplanar CT image reconstructions and MIPs were obtained to evaluate the vascular anatomy. Carotid stenosis measurements (when applicable) are obtained utilizing NASCET criteria, using the distal internal carotid diameter as the denominator. Multiphase CT imaging of the brain was performed following IV bolus contrast injection. Subsequent parametric perfusion maps were calculated using RAPID software. CONTRAST:  OMNIPAQUE IOHEXOL 350 MG/ML SOLN COMPARISON:  None. FINDINGS: CTA NECK Aortic arch: Trace calcified plaque along the included arch. Great vessel origins are patent. Right carotid system: Patent.  No stenosis. Left carotid system: Patent. Trace mixed plaque at the bifurcation. No stenosis. Vertebral arteries: Patent and codominant.  No stenosis. Skeleton: Mild cervical spine degenerative changes. Degenerative changes at the temporomandibular joints. Other neck: Unremarkable. Upper chest:  Centrilobular emphysema. Nodular but somewhat tubular opacities at the left apex probably reflect impacted airways. Review of the MIP images confirms the above  findings CTA HEAD Anterior circulation: Intracranial internal carotid arteries are patent with mild calcified plaque. Left M1 MCA is patent. There is occlusion of a distal left M2 MCA branch within the posterior sylvian fissure. This may reconstitute more distally. Anterior and right middle cerebral arteries are patent. Posterior circulation: Intracranial vertebral arteries are patent. Basilar artery is patent. Major cerebellar artery origins are patent. A left posterior communicating artery is present. Posterior cerebral arteries are patent. Venous sinuses: Patent as allowed by contrast bolus timing. Review of the MIP images confirms the above findings CT Brain Perfusion Findings: CBF (<30%) Volume: 4mL Perfusion (Tmax>6.0s) volume: 7mL Mismatch Volume: 36mL Infarction Location: Area of infarction and most territory at risk within the posterior left MCA territory. There is some artifactual calculated territory at risk within the inferior frontal lobes. 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 May reconstitute more distally. Perfusion imaging demonstrates 3 mL of core infarction and 18 mL of penumbra within the posterior left MCA territory. The penumbra volume is overestimated as there is some artifact in the inferior frontal lobes included in the number. No occlusion or hemodynamically significant stenosis in the neck. These results were provided by telephone at the time of interpretation on 02/25/2021 at 3:55 pm to provider [facility] , who verbally acknowledged these results. Electronically Signed   By: Guadlupe Spanish M.D.   On: 02/25/2021 16:01    Assessment: 75 y.o. female presenting with acute onset of aphasia 1. Exam reveals dense receptive and expressive aphasia 2. CT head: There is no acute intracranial  hemorrhage or evidence of acute infarction. ASPECT score is 10. Age-indeterminate small vessel infarct of the left gangliocapsular region. 3. 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 May reconstitute more distally. Perfusion imaging demonstrates 3 mL of core infarction and 18 mL of penumbra within the posterior left MCA territory. The penumbra volume is overestimated as there is some artifact in the inferior frontal lobes included in the number. No occlusion or hemodynamically significant stenosis in the neck.  4. Stroke Risk Factors - HTN and HLD  Plan: 1. Admitting to Neuro ICU following neurointerventional procedure 2. Post-TNK and thrombectomy order set to include frequent neuro checks and BP management.  3. No antiplatelet medications or anticoagulants for at least 24 hours following TNK.  4. DVT prophylaxis with SCDs.  5. Increase atorvastatin to 40 mg po qd.  6. Will need to be started on antiplatelet therapy if follow up CT at 24 hours is negative for hemorrhagic conversion. 7. Cardiac telemetry 8. TTE.  9. MRI brain  10. PT/OT/Speech.  11. NPO until passes swallow evaluation.  12. Fasting lipid panel, HgbA1c  35 minutes spent in the emergent neurological evaluation and management of this critically ill patient.   Electronically signed: Dr. Caryl Pina 02/25/2021, 5:28 PM

## 2021-02-25 NOTE — Sedation Documentation (Signed)
Transferred pt to 4N27. Pt awake, aphasic. Not following commands. Slight movement of lower extremities. Pt belonging bag with patient with jewelry, dentures and shoes.

## 2021-02-25 NOTE — ED Notes (Signed)
Son has all of pt teeth, pocket book and some jewelery.

## 2021-02-25 NOTE — Anesthesia Postprocedure Evaluation (Signed)
Anesthesia Post Note  Patient: Michelle Dean  Procedure(s) Performed: IR WITH ANESTHESIA     Patient location during evaluation: SICU Anesthesia Type: General Level of consciousness: awake and alert Pain management: pain level controlled Vital Signs Assessment: post-procedure vital signs reviewed and stable Respiratory status: spontaneous breathing and nonlabored ventilation Cardiovascular status: stable Postop Assessment: no apparent nausea or vomiting Anesthetic complications: no   No notable events documented.  Last Vitals:  Vitals:   02/25/21 2030 02/25/21 2100  BP:  126/68  Pulse: 69 70  Resp: 19 20  Temp:    SpO2: 100% 100%    Last Pain:  Vitals:   02/25/21 2000  TempSrc: Axillary  PainSc: 0-No pain                 Jaliana Medellin DANIEL

## 2021-02-25 NOTE — ED Provider Notes (Signed)
[facility] EMERGENCY DEPARTMENT Provider Note   CSN: [id number] Arrival date & time: 02/25/21  1423     History Chief Complaint  Patient presents with   Code Stroke     Michelle Dean is a 75 y.o. female.  75 yo female without known medical history as not available in this EMR presents to the ED secondary to strokelike symptoms.  Per EMS patient with acute onset of aphasia approximately 1 to 1:30 PM today.  EMS reports patient drove to family residence to visit and while she was there began to have significant difficulty with her speech.  Patient initially combative and did not want to come to the emergency department, delirium, was given Haldol by EMS and was agreeable to come to ED for evaluation.  Patient unable provide appropriate history and response to questions with the phrase "what" and "I do not know what you want me to do," "I'm fine."  She is unable to follow commands appropriately.  She has no acute complaints but is unable to provide an appropriate history.  Level 5 caveat, AMS  The history is provided by the patient. No language interpreter was used.  Cerebrovascular Accident This is a new problem. The current episode started 1 to 2 hours ago. The problem occurs constantly. The problem has not changed since onset.Pertinent negatives include no chest pain, no abdominal pain, no headaches and no shortness of breath.      History reviewed. No pertinent past medical history.  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 No pertinent surgical history.   OB History   No obstetric history on file.     History reviewed. No pertinent family history.     Home Medications Prior to Admission medications   Medication Sig Start Date End Date Taking? Authorizing Provider  albuterol (PROVENTIL) (2.5 MG/3ML) 0.083% nebulizer solution Take 3 mLs (2.5 mg  dose) by nebulization every 4 (four) hours as needed for Wheezing. 11/21/19  Yes [provider]  atorvastatin (LIPITOR) 10 MG tablet Take 10 mg by mouth daily. 12/24/20  Yes [provider]  carboxymethylcellulose (REFRESH PLUS) 0.5 % SOLN Place 1 drop into both eyes 4 (four) times a day as needed. For dry eyes.   Yes [provider]  DULERA 100-5 MCG/ACT AERO Inhale 2 puffs into the lungs 2 (two) times daily. 02/14/21  Yes [provider]  losartan (COZAAR) 25 MG tablet Take 25 mg by mouth daily. 02/21/21  Yes [provider]  Multiple Vitamin (MULTIVITAMIN) tablet Take by mouth.   Yes [provider]  omeprazole (PRILOSEC) 20 MG capsule Take 20 mg by mouth daily. 02/04/21  Yes [provider]  tiotropium (SPIRIVA HANDIHALER) 18 MCG inhalation capsule PLACE 1 DOSE(CAPSULE) IN INHALER AND INHALE ONCE DAILY AS DIRECTED. 01/29/21  Yes [provider]  doxycycline (VIBRAMYCIN) 100 MG capsule Take 100 mg by mouth 2 (two) times daily. Patient not taking: Reported on 02/25/2021 12/18/20   [provider]    Allergies    Penicillins and Lisinopril  Review of Systems   Review of Systems  Unable to perform ROS: Acuity of condition  Respiratory:  Negative for shortness of breath.   Cardiovascular:  Negative for chest pain.  Gastrointestinal:  Negative for abdominal pain.  Neurological:  Negative for headaches.   Physical Exam Updated Vital Signs BP 140/85   Pulse  89   Temp 98.9 F (37.2 C) (Oral)   Resp 19   Ht 5\' 7"  (1.702 m)   Wt 90.2 kg   SpO2 96%   BMI 31.15 kg/m   Physical Exam Vitals and nursing note reviewed.  Constitutional:      General: She is not in acute distress.    Appearance: Normal appearance.  HENT:     Head: Normocephalic and atraumatic.     Right Ear: External ear normal.     Left Ear: External ear normal.     Nose: Nose normal.     Mouth/Throat:     Mouth: Mucous membranes are moist.   Eyes:     General: No scleral icterus.       Right eye: No discharge.        Left eye: No discharge.  Cardiovascular:     Rate and Rhythm: Normal rate and regular rhythm.     Pulses: Normal pulses.     Heart sounds: Normal heart sounds.  Pulmonary:     Effort: Pulmonary effort is normal. No respiratory distress.     Breath sounds: Normal breath sounds.  Abdominal:     General: Abdomen is flat.     Tenderness: There is no abdominal tenderness.  Musculoskeletal:        General: Normal range of motion.     Cervical back: Normal range of motion.     Right lower leg: No edema.     Left lower leg: No edema.  Skin:    General: Skin is warm and dry.     Capillary Refill: Capillary refill takes less than 2 seconds.  Neurological:     Mental Status: She is alert.     GCS: GCS eye subscore is 4. GCS verbal subscore is 4. GCS motor subscore is 6.     Cranial Nerves: Cranial nerves are intact.     Motor: Motor function is intact. No pronator drift.     Comments: Aox0 Non complaint with neuro exam.   Psychiatric:        Mood and Affect: Mood normal.        Behavior: Behavior normal.    ED Results / Procedures / Treatments   Labs (all labs ordered are listed, but only abnormal results are displayed) Labs Reviewed  APTT - Abnormal; Notable for the following components:      Result Value   aPTT 23 (*)    All other components within normal limits  COMPREHENSIVE METABOLIC PANEL - Abnormal; Notable for the following components:   Potassium 3.4 (*)    Glucose, Bld 113 (*)    Calcium 8.8 (*)    All other components within normal limits  RESP PANEL BY RT-PCR (FLU A&B, COVID) ARPGX2  ETHANOL  PROTIME-INR  CBC  DIFFERENTIAL  RAPID URINE DRUG SCREEN, HOSP PERFORMED  URINALYSIS, ROUTINE W REFLEX MICROSCOPIC  HEMOGLOBIN A1C  LIPID PANEL  I-STAT CHEM 8, ED  CBG MONITORING, ED    EKG None  Radiology CT HEAD CODE STROKE WO CONTRAST  Result Date: 02/25/2021 CLINICAL DATA:  Code  stroke. EXAM: CT HEAD WITHOUT CONTRAST TECHNIQUE: Contiguous axial images were obtained from the base of the skull through the vertex without intravenous contrast. COMPARISON:  None. FINDINGS: Brain: No acute intracranial hemorrhage, mass effect, or edema. Miriana Gaertner-white differentiation is preserved. Age-indeterminate small vessel infarct of the left gangliocapsular region. Additional patchy low-density in the supratentorial white matter is nonspecific but may reflect mild chronic microvascular ischemic changes.  Ventricles and sulci are within normal limits in size and configuration. No extra-axial collection. Vascular: No hyperdense vessel. Mild intracranial atherosclerotic calcification at the skull base. Skull: Unremarkable. Sinuses/Orbits: Aerated. Other: Mastoid air cells are clear. ASPECTS (Alberta Stroke Program Early CT Score) - Ganglionic level infarction (caudate, lentiform nuclei, internal capsule, insula, M1-M3 cortex): 7 - Supraganglionic infarction (M4-M6 cortex): 3 Total score (0-10 with 10 being normal): 10 IMPRESSION: There is no acute intracranial hemorrhage or evidence of acute infarction. ASPECT score is 10. Age-indeterminate small vessel infarct of the left gangliocapsular region. These results were called by telephone at the time of interpretation on 02/25/2021 at 2:37 pm to provider Tanda Rockers , who verbally acknowledged these results. Electronically Signed   By: Guadlupe Spanish M.D.   On: 02/25/2021 14:39   CT ANGIO HEAD NECK W WO CM W PERF (CODE STROKE)  Result Date: 02/25/2021 CLINICAL DATA:  Neuro deficit, acute, stroke suspected EXAM: CT ANGIOGRAPHY HEAD AND NECK CT PERFUSION BRAIN TECHNIQUE: Multidetector CT imaging of the head and neck was performed using the standard protocol during bolus administration of intravenous contrast. Multiplanar CT image reconstructions and MIPs were obtained to evaluate the vascular anatomy. Carotid stenosis measurements (when applicable) are obtained  utilizing NASCET criteria, using the distal internal carotid diameter as the denominator. Multiphase CT imaging of the brain was performed following IV bolus contrast injection. Subsequent parametric perfusion maps were calculated using RAPID software. CONTRAST:  OMNIPAQUE IOHEXOL 350 MG/ML SOLN COMPARISON:  None. FINDINGS: CTA NECK Aortic arch: Trace calcified plaque along the included arch. Great vessel origins are patent. Right carotid system: Patent.  No stenosis. Left carotid system: Patent. Trace mixed plaque at the bifurcation. No stenosis. Vertebral arteries: Patent and codominant.  No stenosis. Skeleton: Mild cervical spine degenerative changes. Degenerative changes at the temporomandibular joints. Other neck: Unremarkable. Upper chest: Centrilobular emphysema. Nodular but somewhat tubular opacities at the left apex probably reflect impacted airways. Review of the MIP images confirms the above findings CTA HEAD Anterior circulation: Intracranial internal carotid arteries are patent with mild calcified plaque. Left M1 MCA is patent. There is occlusion of a distal left M2 MCA branch within the posterior sylvian fissure. This may reconstitute more distally. Anterior and right middle cerebral arteries are patent. Posterior circulation: Intracranial vertebral arteries are patent. Basilar artery is patent. Major cerebellar artery origins are patent. A left posterior communicating artery is present. Posterior cerebral arteries are patent. Venous sinuses: Patent as allowed by contrast bolus timing. Review of the MIP images confirms the above findings CT Brain Perfusion Findings: CBF (<30%) Volume: 3mL Perfusion (Tmax>6.0s) volume: 21mL Mismatch Volume: 18mL Infarction Location: Area of infarction and most territory at risk within the posterior left MCA territory. There is some artifactual calculated territory at risk within the inferior frontal lobes. IMPRESSION: Occlusion of distal left M2 within the  posterior sylvian fissure. May reconstitute more distally. Perfusion imaging demonstrates 3 mL of core infarction and 18 mL of penumbra within the posterior left MCA territory. The penumbra volume is overestimated as there is some artifact in the inferior frontal lobes included in the number. No occlusion or hemodynamically significant stenosis in the neck. These results were provided by telephone at the time of interpretation on 02/25/2021 at 3:55 pm to provider [facility] , who verbally acknowledged these results. Electronically Signed   By: Guadlupe Spanish M.D.   On: 02/25/2021 16:01    Procedures .Critical Care E&M Performed by: Sloan Leiter, DO  Critical care provider statement:  Critical care time (minutes):  48   Critical care time was exclusive of:  Separately billable procedures and treating other patients   Critical care was necessary to treat or prevent imminent or life-threatening deterioration of the following conditions:  CNS failure or compromise   Critical care was time spent personally by me on the following activities:  Development of treatment plan with patient or surrogate, discussions with consultants, evaluation of patient's response to treatment, examination of patient and obtaining history from patient or surrogate   Care discussed with: admitting provider   After initial E/M assessment, critical care services were subsequently performed that were exclusive of separately billable procedures or treatment.   Comments:     CVA requiring TNK and IR thrombectomy    Medications Ordered in ED Medications  labetalol (NORMODYNE) injection 10 mg (10 mg Intravenous Given 02/25/21 1616)    And  clevidipine (CLEVIPREX) infusion 0.5 mg/mL (has no administration in time range)  vancomycin (VANCOCIN) 1-5 GM/200ML-% IVPB (has no administration in time range)  nitroGLYCERIN 100 mcg/mL intra-arterial injection (has no administration in time range)  iohexol (OMNIPAQUE) 300 MG/ML  solution 100 mL (has no administration in time range)   stroke: mapping our early stages of recovery book (has no administration in time range)  0.9 %  sodium chloride infusion (has no administration in time range)  acetaminophen (TYLENOL) tablet 650 mg (has no administration in time range)    Or  acetaminophen (TYLENOL) 160 MG/5ML solution 650 mg (has no administration in time range)    Or  acetaminophen (TYLENOL) suppository 650 mg (has no administration in time range)  senna-docusate (Senokot-S) tablet 1 tablet (has no administration in time range)  pantoprazole (PROTONIX) injection 40 mg (has no administration in time range)  albuterol (PROVENTIL) (2.5 MG/3ML) 0.083% nebulizer solution 2.5 mg (has no administration in time range)  atorvastatin (LIPITOR) tablet 10 mg (has no administration in time range)  mometasone-formoterol (DULERA) 100-5 MCG/ACT inhaler 2 puff (has no administration in time range)  losartan (COZAAR) tablet 25 mg (has no administration in time range)  umeclidinium bromide (INCRUSE ELLIPTA) 62.5 MCG/INH 1 puff (has no administration in time range)  nitroGLYCERIN 25 mcg in sodium chloride (PF) 0.9 % 59.71 mL (25 mcg/mL) syringe (25 mcg Intra-arterial Given 02/25/21 1847)  tenecteplase (TNKASE) injection for Stroke 23 mg (23 mg Intravenous Given 02/25/21 1529)  iohexol (OMNIPAQUE) 350 MG/ML injection 100 mL (100 mLs Intravenous Contrast Given 02/25/21 1541)  fentaNYL (SUBLIMAZE) 100 MCG/2ML injection (  Override pull for Anesthesia 02/25/21 1731)    ED Course  I have reviewed the triage vital signs and the nursing notes.  Pertinent labs & imaging results that were available during my care of the patient were reviewed by me and considered in my medical decision making (see chart for details).    MDM Rules/Calculators/A&P                         This patient complains of aphasia; this involves an extensive number of treatment options and is a complaint that carries with  it a high risk of complications and morbidity. Serious etiologies considered.   Concern for Broca's aphasia, onset 1-1.5 hours ago. Stroke alert activated.  NIHSS 4 initially. HDS, protecting airway,.  I ordered, reviewed and interpreted labs which were stable.  I ordered medication labetalol, celvidipine for BP management given CVA.   Labs reviewed and are stable.  CT stroke protocol is negative, she has been  consented for TNK by [facility] team.   Patient pending CTA at this time for final disposition. If CT is negative for occlusion then will stay at [facility] for ICU, if positive will plan transfer to [facility] for IR intervention. Pt updated on plan. Patient signed out to incoming physician pending final imaging and neurology disposition.     Final Clinical Impression(s) / ED Diagnoses Final diagnoses:  Acute ischemic stroke [facility])  Aphasia    Rx / DC Orders ED Discharge Orders     None        Sloan Leiter, DO 02/25/21 1850

## 2021-02-25 NOTE — Transfer of Care (Signed)
Immediate Anesthesia Transfer of Care Note  Patient: Michelle Dean  Procedure(s) Performed: IR WITH ANESTHESIA  Patient Location: ICU  Anesthesia Type:General  Level of Consciousness: drowsy  Airway & Oxygen Therapy: Patient Spontanous Breathing and Patient connected to face mask oxygen  Post-op Assessment: Report given to RN and Post -op Vital signs reviewed and stable  Post vital signs: Reviewed and stable  Last Vitals:  Vitals Value Taken Time  BP 111/78 02/25/21 2016  Temp    Pulse 74 02/25/21 2022  Resp 21 02/25/21 2022  SpO2 100 % 02/25/21 2022  Vitals shown include unvalidated device data.  Last Pain:  Vitals:   02/25/21 2000  TempSrc: Axillary  PainSc:          Complications: No notable events documented.

## 2021-02-26 ENCOUNTER — Encounter (HOSPITAL_COMMUNITY): Payer: Self-pay | Admitting: Radiology

## 2021-02-26 ENCOUNTER — Inpatient Hospital Stay (HOSPITAL_COMMUNITY): Payer: Medicare Other

## 2021-02-26 DIAGNOSIS — I639 Cerebral infarction, unspecified: Secondary | ICD-10-CM | POA: Diagnosis not present

## 2021-02-26 DIAGNOSIS — I6389 Other cerebral infarction: Secondary | ICD-10-CM

## 2021-02-26 DIAGNOSIS — R1312 Dysphagia, oropharyngeal phase: Secondary | ICD-10-CM

## 2021-02-26 DIAGNOSIS — I6602 Occlusion and stenosis of left middle cerebral artery: Secondary | ICD-10-CM | POA: Diagnosis not present

## 2021-02-26 DIAGNOSIS — I63512 Cerebral infarction due to unspecified occlusion or stenosis of left middle cerebral artery: Secondary | ICD-10-CM | POA: Diagnosis not present

## 2021-02-26 DIAGNOSIS — E7800 Pure hypercholesterolemia, unspecified: Secondary | ICD-10-CM | POA: Diagnosis not present

## 2021-02-26 LAB — RAPID URINE DRUG SCREEN, HOSP PERFORMED
Amphetamines: NOT DETECTED
Barbiturates: NOT DETECTED
Benzodiazepines: NOT DETECTED
Cocaine: NOT DETECTED
Opiates: NOT DETECTED
Tetrahydrocannabinol: NOT DETECTED

## 2021-02-26 LAB — LIPID PANEL
Cholesterol: 131 mg/dL (ref 0–200)
HDL: 57 mg/dL (ref >40)
LDL Cholesterol: 63 mg/dL (ref 0–99)
Total CHOL/HDL Ratio: 2.3 RATIO
Triglycerides: 55 mg/dL (ref <150)
VLDL: 11 mg/dL (ref 0–40)

## 2021-02-26 LAB — ECHOCARDIOGRAM COMPLETE
Area-P 1/2: 3.61 cm2
Height: 67 in
S' Lateral: 2.4 cm
Weight: 3182.4 oz

## 2021-02-26 LAB — URINALYSIS, ROUTINE W REFLEX MICROSCOPIC
Bilirubin Urine: NEGATIVE
Glucose, UA: NEGATIVE mg/dL
Ketones, ur: 20 mg/dL — AB
Leukocytes,Ua: NEGATIVE
Nitrite: NEGATIVE
Protein, ur: NEGATIVE mg/dL
Specific Gravity, Urine: >1.046 — ABNORMAL HIGH (ref 1.005–1.030)
pH: 6 (ref 5.0–8.0)

## 2021-02-26 LAB — CBC WITH DIFFERENTIAL/PLATELET
Abs Immature Granulocytes: 0.05 10*3/uL (ref 0.00–0.07)
Basophils Absolute: 0 10*3/uL (ref 0.0–0.1)
Basophils Relative: 0 %
Eosinophils Absolute: 0 10*3/uL (ref 0.0–0.5)
Eosinophils Relative: 0 %
HCT: 38.6 % (ref 36.0–46.0)
Hemoglobin: 12.4 g/dL (ref 12.0–15.0)
Immature Granulocytes: 0 %
Lymphocytes Relative: 8 %
Lymphs Abs: 1 10*3/uL (ref 0.7–4.0)
MCH: 30.2 pg (ref 26.0–34.0)
MCHC: 32.1 g/dL (ref 30.0–36.0)
MCV: 93.9 fL (ref 80.0–100.0)
Monocytes Absolute: 0.9 10*3/uL (ref 0.1–1.0)
Monocytes Relative: 8 %
Neutro Abs: 10.2 10*3/uL — ABNORMAL HIGH (ref 1.7–7.7)
Neutrophils Relative %: 84 %
Platelets: 222 10*3/uL (ref 150–400)
RBC: 4.11 MIL/uL (ref 3.87–5.11)
RDW: 13.8 % (ref 11.5–15.5)
WBC: 12.2 10*3/uL — ABNORMAL HIGH (ref 4.0–10.5)
nRBC: 0 % (ref 0.0–0.2)

## 2021-02-26 LAB — BASIC METABOLIC PANEL
Anion gap: 8 (ref 5–15)
BUN: 10 mg/dL (ref 8–23)
CO2: 25 mmol/L (ref 22–32)
Calcium: 8.4 mg/dL — ABNORMAL LOW (ref 8.9–10.3)
Chloride: 109 mmol/L (ref 98–111)
Creatinine, Ser: 0.75 mg/dL (ref 0.44–1.00)
GFR, Estimated: >60 mL/min (ref >60)
Glucose, Bld: 142 mg/dL — ABNORMAL HIGH (ref 70–99)
Potassium: 3.6 mmol/L (ref 3.5–5.1)
Sodium: 142 mmol/L (ref 135–145)

## 2021-02-26 LAB — HEMOGLOBIN A1C
Hgb A1c MFr Bld: 5.5 % (ref 4.8–5.6)
Mean Plasma Glucose: 111.15 mg/dL

## 2021-02-26 MED ORDER — SODIUM CHLORIDE 0.9 % IV BOLUS
1000.0000 mL | Freq: Once | INTRAVENOUS | Status: AC
  Administered 2021-02-26: 1000 mL via INTRAVENOUS

## 2021-02-26 MED ORDER — ADULT MULTIVITAMIN W/MINERALS CH
1.0000 | ORAL_TABLET | Freq: Every day | ORAL | Status: DC
  Administered 2021-02-27 – 2021-03-07 (×9): 1 via ORAL
  Filled 2021-02-26 (×9): qty 1

## 2021-02-26 MED ORDER — ASPIRIN EC 81 MG PO TBEC
81.0000 mg | DELAYED_RELEASE_TABLET | Freq: Every day | ORAL | Status: DC
  Administered 2021-02-26 – 2021-02-27 (×2): 81 mg via ORAL
  Filled 2021-02-26 (×2): qty 1

## 2021-02-26 NOTE — PMR Pre-admission (Signed)
PMR Admission Coordinator Pre-Admission Assessment  Patient: Michelle Dean is an 75 y.o., female MRN: [id number] DOB: 07-18-1945 Height: _0  (170.2 cm) Weight: 90.2 kg  Insurance Information HMO:     PPO:      PCP:      IPA:      80/20:      OTHER:  PRIMARY: U[employer] Medicare      Policy#: [id number]      Subscriber: patient CM Name:   [name]    Phone#:    [phone] option [phone]  Fax#: [phone] Pre-Cert#: [id number] approved for 7 days      Employer:  Benefits:  Phone #: [phone]     Name: 10/17 Eff. Date: 05/12/2020     Deduct: none      Out of Pocket Max: $3600      Life Max: none CIR: $295 co pay per day days 1 until 5      SNF: no copay days 1 until 20; $188 co pay per day days 21 until 40; no copay days 41 until; 100 Outpatient: $30 per visit     Co-Pay: visits per medical neccesity Home Health: 100%      Co-Pay: visits per medical neccesity DME: 80%     Co-Pay: 20% Providers: in-network  SECONDARY:       Policy#:      Phone#:   Development worker, community:       Phone#:   The Engineer, petroleum" for patients in Inpatient Rehabilitation Facilities with attached "Privacy Act [place] Records" was provided and verbally reviewed with: Family  Emergency Contact Information Contact Information     Name Relation Home Work Mobile   [name] [phone]   [name]   [phone]       Current Medical History  Patient Admitting Diagnosis: Left MCA stroke  History of Present Illness: 75 year old right-handed female with history of COPD with intermittent oxygen at home, hypertension and hyperlipidemia.   Presented 02/25/2021 to the [facility] with expressive receptive aphasia.  CT of the head showed no acute intracranial hemorrhage or evidence of acute infarction.  Age-indeterminate small vessel infarcts of the left ganglial capsular region.  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  Patient was  administered TNK.  CTA revealed a left M2/M3 occlusion patient was transferred to [facility] for four-vessel angiogram and underwent partial recanalization with reocclusion followed by mechanical thrombolysis achieving a TICI revascularization.  Hospital course 10/19 developed atrial fibrillation RVR started on Cardizem however RVR persisted with cardiology service follow-up underwent cardioversion 10/21 unfortunately back into A. fib and again with cardioversion 03/05/2021 sustaining NSR.  TEE  ejection fraction 50-55% no thrombus or vegetation.  Patient currently is maintained on Eliquis.  Close monitoring of blood pressure maintained on ProAmatine.  Patient did have some leukocytosis 23,600 improved to 14,000 and latest chest x-ray showed no signs of pneumonia she is currently completing a course of doxycycline.  Tolerating a regular consistency diet . Complete NIHSS TOTAL: 4  Patient's medical record from [facility] has been reviewed by the rehabilitation admission coordinator and physician.  Past Medical History  History reviewed. No pertinent past medical history.  Has the patient had major surgery during 100 days prior to admission? Yes  Family History   family history is not on file.  Current Medications  Current Facility-Administered Medications:    0.9 %  sodium chloride infusion, 250 mL, Intravenous, Continuous, Adrian Prows, MD, Stopped at 03/02/21 1000  acetaminophen (TYLENOL) tablet 650 mg, 650 mg, Oral, Q4H PRN, 650 mg at 03/01/21 2341 **OR** acetaminophen (TYLENOL) 160 MG/5ML solution 650 mg, 650 mg, Per Tube, Q4H PRN **OR** acetaminophen (TYLENOL) suppository 650 mg, 650 mg, Rectal, Q4H PRN, Adrian Prows, MD   albuterol (PROVENTIL) (2.5 MG/3ML) 0.083% nebulizer solution 2.5 mg, 2.5 mg, Nebulization, Q4H PRN, Adrian Prows, MD, 2.5 mg at 03/05/21 2230   amiodarone (PACERONE) tablet 200 mg, 200 mg, Oral, Daily, Tolia, Sunit, DO, 200 mg at 03/07/21 1259   apixaban  (ELIQUIS) tablet 5 mg, 5 mg, Oral, BID, Adrian Prows, MD, 5 mg at 03/07/21 1121   atorvastatin (LIPITOR) tablet 10 mg, 10 mg, Oral, Daily, Adrian Prows, MD, 10 mg at 03/07/21 1122   Chlorhexidine Gluconate Cloth 2 % PADS 6 each, 6 each, Topical, Daily, Adrian Prows, MD, 6 each at 03/06/21 0857   labetalol (NORMODYNE) injection 10 mg, 10 mg, Intravenous, Q10 min PRN, 10 mg at 02/25/21 1616 **AND** [DISCONTINUED] clevidipine (CLEVIPREX) infusion 0.5 mg/mL, 0-21 mg/hr, Intravenous, Continuous, Bhagat, Srishti L, MD, Stopped at 02/26/21 0944   magnesium oxide (MAG-OX) tablet 400 mg, 400 mg, Oral, Daily, Adrian Prows, MD, 400 mg at 03/07/21 1121   midodrine (PROAMATINE) tablet 5 mg, 5 mg, Oral, TID WC, Tolia, Sunit, DO, 5 mg at 03/07/21 1259   mometasone-formoterol (DULERA) 100-5 MCG/ACT inhaler 2 puff, 2 puff, Inhalation, BID, Adrian Prows, MD, 2 puff at 03/06/21 1951   multivitamin with minerals tablet 1 tablet, 1 tablet, Oral, Daily, Adrian Prows, MD, 1 tablet at 03/07/21 1121   senna-docusate (Senokot-S) tablet 1 tablet, 1 tablet, Oral, QHS PRN, Adrian Prows, MD   umeclidinium bromide (INCRUSE ELLIPTA) 62.5 MCG/INH 1 puff, 1 puff, Inhalation, Daily, Adrian Prows, MD, 1 puff at 03/04/21 0808   verapamil (CALAN) tablet 40 mg, 40 mg, Oral, Q8H, Adrian Prows, MD, 40 mg at 03/07/21 1300  Patients Current Diet:  Diet Order             Diet Heart Room service appropriate? Yes; Fluid consistency: Thin  Diet effective now                  Precautions / Restrictions Precautions Precautions: Fall Precaution Comments: receptive aphasia, HoH Restrictions Weight Bearing Restrictions: No   Has the patient had 2 or more falls or a fall with injury in the past year? No  Prior Activity Level Limited Community (1-2x/wk): went places ~1-2 days/week  Prior Functional Level Self Care: Did the patient need help bathing, dressing, using the toilet or eating? Independent  Indoor Mobility: Did the patient need assistance  with walking from room to room (with or without device)? Independent  Stairs: Did the patient need assistance with internal or external stairs (with or without device)? Independent  Functional Cognition: Did the patient need help planning regular tasks such as shopping or remembering to take medications? Independent  Patient Information    Patient's Response To:     Home Assistive Devices / Equipment Home Equipment: None  Prior Device Use: Indicate devices/aids used by the patient prior to current illness, exacerbation or injury? None of the above  Current Functional Level Cognition  Arousal/Alertness: Awake/alert Overall Cognitive Status: Impaired/Different from baseline Difficult to assess due to: Impaired communication Orientation Level: Disoriented to place, Disoriented X4 Following Commands: Follows one step commands inconsistently Safety/Judgement: Decreased awareness of safety, Decreased awareness of deficits General Comments: continues to have receptive difficulties, unable to state her last name without hints. Improved management of RW but still  limited awareness of environmetnal obstacles Attention: Focused Focused Attention: Impaired Memory:  (very challenging to assess will require additional clinical interaction) Awareness: Impaired Problem Solving: Impaired Executive Function: Organizing, Self Monitoring Organizing: Impaired Safety/Judgment: Impaired    Extremity Assessment (includes Sensation/Coordination)  Upper Extremity Assessment: Overall WFL for tasks assessed  Lower Extremity Assessment: Defer to PT evaluation    ADLs  Overall ADL's : Needs assistance/impaired Eating/Feeding: Set up Grooming: Set up, Standing Grooming Details (indicate cue type and reason): with mod cues to sequence and min guard balance. Upper Body Bathing: Min guard, Standing Lower Body Bathing: Minimal assistance, Sit to/from stand Upper Body Dressing : Min guard, Sitting Lower  Body Dressing: Minimal assistance, Sitting/lateral leans Toilet Transfer: Supervision/safety, Rolling walker (2 wheels) Toilet Transfer Details (indicate cue type and reason): supervision for safety - pt with poor management of RW Toileting- Clothing Manipulation and Hygiene: Supervision/safety Toileting - Clothing Manipulation Details (indicate cue type and reason): for safety Functional mobility during ADLs: Min guard General ADL Comments: pt demonstrates increased ability to engage in adl task and with increased therapy coudl progress to higher level    Mobility  Overal bed mobility: Needs Assistance Bed Mobility: Supine to Sit Supine to sit: Supervision General bed mobility comments: in chair    Transfers  Overall transfer level: Needs assistance Equipment used: Rolling walker (2 wheels) Transfers: Sit to/from Stand Sit to Stand: Supervision General transfer comment: For safety, no physical assist needed, pt picking RW up off of floor during turns    Ambulation / Gait / Stairs / Wheelchair Mobility  Ambulation/Gait Ambulation/Gait assistance: Herbalist (Feet): 30 Feet (15 ft, rest break in bathroom) Assistive device: Rolling walker (2 wheels) Gait Pattern/deviations: Step-through pattern, Decreased stride length, Drifts right/left General Gait Details: Pt responds better to visual cues for directions using RW, DOE 2/4 Gait velocity: decreased Gait velocity interpretation: <1.8 ft/sec, indicate of risk for recurrent falls    Posture / Balance Dynamic Sitting Balance Sitting balance - Comments: able to sit EOB without assist Balance Overall balance assessment: Needs assistance Sitting-balance support: No upper extremity supported, Feet supported Sitting balance-Leahy Scale: Fair Sitting balance - Comments: able to sit EOB without assist Standing balance support: No upper extremity supported, During functional activity Standing balance-Leahy Scale:  Fair Standing balance comment: needs at least U UE support for dynamic tasks    Special needs/care consideration    Previous Home Environment  Living Arrangements: Alone  Lives With: Alone Available Help at Discharge: Family, Available 24 hours/day Type of Home: House Home Layout: One level Home Access: Level entry Entrance Stairs-Rails: Right Entrance Stairs-Number of Steps: 1 Bathroom Shower/Tub: Chiropodist: Standard Bathroom Accessibility: Yes How Accessible: Accessible via walker [place]: No  Discharge Living Setting Plans for Discharge Living Setting: Patient's home Type of Home at Discharge: House Discharge Home Layout: One level Discharge Home Access: Level entry Discharge Bathroom Shower/Tub: [place] unit Discharge Bathroom Toilet: Standard Discharge Bathroom Accessibility: Yes How Accessible: Accessible via walker Does the patient have any problems obtaining your medications?: No  Social/Family/Support Systems Anticipated Caregiver: Francee Nodal and other family Anticipated Caregiver's Contact Information: [phone] Caregiver Availability: 24/7 Discharge Plan Discussed with Primary Caregiver: Yes Is Caregiver In Agreement with Plan?: Yes Does Caregiver/Family have Issues with Lodging/Transportation while Pt is in Rehab?: No  Goals Goals to improve gait and work on speaking and swallowing- mod I to supervision for PT/OT and min A for SLP  Decrease burden of Care through IP  rehab admission: NA  Possible need for SNF placement upon discharge: Not anticipated  Patient Condition: I have reviewed medical records from [facility] , spoken with CM, and patient and son. I met with patient at the bedside for inpatient rehabilitation assessment.  Patient will benefit from ongoing PT, OT, and SLP, can actively participate in 3 hours of therapy a day 5 days of the week, and can make measurable gains during the admission.   Patient will also benefit from the coordinated team approach during an Inpatient Acute Rehabilitation admission.  The patient will receive intensive therapy as well as Rehabilitation physician, nursing, social worker, and care management interventions.  Due to bladder management, bowel management, safety, skin/wound care, disease management, medication administration, pain management, and patient education the patient requires 24 hour a day rehabilitation nursing.  The patient is currently min to mod assist overall with mobility and basic ADLs.  Discharge setting and therapy post discharge at home with home health is anticipated.  Patient has agreed to participate in the Acute Inpatient Rehabilitation Program and will admit today.  Preadmission Screen Completed By:  Cleatrice Burke, 03/07/2021 1:34 PM ______________________________________________________________________   Discussed status with Dr. Dagoberto Ligas on 03/07/2021 at 1443 and received approval for admission today.  Admission Coordinator:  Cleatrice Burke, RN, time  5427 Date 03/07/2021   Assessment/Plan: Diagnosis: L MCA stroke and aphasia and mild R hemi Does the need for close, 24 hr/day Medical supervision in concert with the patient's rehab needs make it unreasonable for this patient to be served in a less intensive setting? Yes Co-Morbidities requiring supervision/potential complications: COPD on Home O2; L MCA infarct, aphasia, HOH Due to bladder management, bowel management, safety, skin/wound care, disease management, medication administration, pain management, and patient education, does the patient require 24 hr/day rehab nursing? Yes Does the patient require coordinated care of a physician, rehab nurse, PT, OT, and SLP to address physical and functional deficits in the context of the above medical diagnosis(es)? Yes Addressing deficits in the following areas: balance, endurance, locomotion, strength, transferring,  bathing, dressing, feeding, grooming, toileting, cognition, speech, language, and swallowing Can the patient actively participate in an intensive therapy program of at least 3 hrs of therapy 5 days a week? Yes The potential for patient to make measurable gains while on inpatient rehab is good Anticipated functional outcomes upon discharge from inpatient rehab: modified independent and supervision PT, modified independent and supervision OT, supervision and min assist SLP Estimated rehab length of stay to reach the above functional goals is: 14-16 days Anticipated discharge destination: Home 10. Overall Rehab/Functional Prognosis: good   MD Signature:

## 2021-02-26 NOTE — Evaluation (Signed)
Physical Therapy Evaluation Patient Details Name: Michelle Dean MRN: [id number] DOB: Sep 10, 1945 Today's Date: 02/26/2021  History of Present Illness  75 yo female presents to [facility] on 10/17 with aphasia. Imaging reveals Left MCA distal M2 stroke, currently embolic of undetermined etiology, transferred to [facility] for intervention. s/p L common carotid arteriogram via R CFA, partial recanalization with reocclusion with x 1 pass with 40 mm solitaireX retriever and contact aspiration,x1 pass with contact aspiration and x1 pass with mechanical thrombolysis  acieving a TICI 2C revascularization on 10/17. PMH includes COPD (intermittently uses oxygen at home), hypertension, hyperlipidemia.  Clinical Impression  Pt presents with impaired strength, receptive aphasia, R inattention, impaired balance, and decreased activity tolerance vs baseline. Pt to benefit from acute PT to address deficits. Pt ambulated short hallway distance, overall requiring min physical assist and max demonstrative and tactile cuing to follow commands. PT to progress mobility as tolerated, and will continue to follow acutely.         Recommendations for follow up therapy are one component of a multi-disciplinary discharge planning process, led by the attending physician.  Recommendations may be updated based on patient status, additional functional criteria and insurance authorization.  Follow Up Recommendations CIR    Equipment Recommendations  None recommended by PT    Recommendations for Other Services Rehab consult     Precautions / Restrictions Precautions Precautions: Fall Precaution Comments: receptive aphasia Restrictions Weight Bearing Restrictions: No      Mobility  Bed Mobility Overal bed mobility: Needs Assistance Bed Mobility: Supine to Sit     Supine to sit: Min assist;HOB elevated     General bed mobility comments: min assist for trunk elevation via HHA, translating LEs to EOB. Increased time,  responds best to demonstrative and tactile cuing.    Transfers Overall transfer level: Needs assistance Equipment used: 1 person hand held assist Transfers: Sit to/from Stand Sit to Stand: Min assist;+2 safety/equipment         General transfer comment: min assist to rise and steady, +2 for safety STS x2, from EOB and BSC over toilet.  Ambulation/Gait Ambulation/Gait assistance: Min assist Gait Distance (Feet): 60 Feet Assistive device: None Gait Pattern/deviations: Step-through pattern;Decreased stride length;Drifts right/left Gait velocity: decr   General Gait Details: light steadying assist, correcting R inattention as pt almost bumped into R doorframe when exiting room. Pt with periods of unsteadiness, which pt is aware of, especially during head turns or directional changes  Stairs            Wheelchair Mobility    Modified Rankin (Stroke Patients Only)       Balance Overall balance assessment: Needs assistance Sitting-balance support: No upper extremity supported;Feet supported Sitting balance-Leahy Scale: Fair Sitting balance - Comments: able to sit EOB without assist   Standing balance support: No upper extremity supported;During functional activity Standing balance-Leahy Scale: Fair Standing balance comment: cannot accept challenge                             Pertinent Vitals/Pain Pain Assessment: Faces Faces Pain Scale: No hurt Pain Intervention(s): Monitored during session    Home Living Family/patient expects to be discharged to:: Private residence Living Arrangements: Alone Available Help at Discharge: Family;Available 24 hours/day (son states she could stay with him, wife, daughters) Type of Home: House Home Access: Stairs to enter   [employer] of Steps: 1 Home Layout: One level Home Equipment: None  Prior Function Level of Independence: Independent               Hand Dominance   Dominant Hand:  Right    Extremity/Trunk Assessment   Upper Extremity Assessment Upper Extremity Assessment: Defer to OT evaluation    Lower Extremity Assessment Lower Extremity Assessment: Generalized weakness (requires active assist to perform hip extension/flexion, knee flex/ext, suspect more due to aphasia vs true strength)    Cervical / Trunk Assessment Cervical / Trunk Assessment: Normal  Communication   Communication: No difficulties;Receptive difficulties  Cognition Arousal/Alertness: Awake/alert Behavior During Therapy: WFL for tasks assessed/performed Overall Cognitive Status: Difficult to assess                                 General Comments: significant receptive aphasia      General Comments General comments (skin integrity, edema, etc.): vss    Exercises     Assessment/Plan    PT Assessment Patient needs continued PT services  PT Problem List Decreased strength;Decreased mobility;Decreased balance;Decreased knowledge of use of DME;Decreased safety awareness;Decreased cognition;Decreased activity tolerance;Decreased knowledge of precautions       PT Treatment Interventions DME instruction;Therapeutic activities;Therapeutic exercise;Gait training;Patient/family education;Balance training;Functional mobility training;Neuromuscular re-education    PT Goals (Current goals can be found in the Care Plan section)  Acute Rehab PT Goals Patient Stated Goal: return home PT Goal Formulation: With patient/family Time For Goal Achievement: 03/12/21 Potential to Achieve Goals: Good    Frequency Min 4X/week   Barriers to discharge        Co-evaluation   Reason for Co-Treatment: To address functional/ADL transfers;Necessary to address cognition/behavior during functional activity;Other (comment) (aphasia, difficulty with direction following and unpredictable) PT goals addressed during session: Mobility/safety with mobility;Balance         AM-PAC PT "6 Clicks"  Mobility  Outcome Measure Help needed turning from your back to your side while in a flat bed without using bedrails?: A Little Help needed moving from lying on your back to sitting on the side of a flat bed without using bedrails?: A Little Help needed moving to and from a bed to a chair (including a wheelchair)?: A Little Help needed standing up from a chair using your arms (e.g., wheelchair or bedside chair)?: A Little Help needed to walk in hospital room?: A Little Help needed climbing 3-5 steps with a railing? : A Lot 6 Click Score: 17    End of Session Equipment Utilized During Treatment: Oxygen (O2 left off during gait, SpO2 upper 80s% on RA, placed back on 3LO2 via [place]) Activity Tolerance: Patient tolerated treatment well Patient left: in chair;with call bell/phone within reach;with chair alarm set;with family/visitor present Nurse Communication: Mobility status PT Visit Diagnosis: Muscle weakness (generalized) (M62.81)    Time: 3009-2330 PT Time Calculation (min) (ACUTE ONLY): 27 min   Charges:   PT Evaluation $PT Eval Low Complexity: 1 Low        Lyza Houseworth S, PT DPT Acute Rehabilitation Services Pager [phone]  Office [phone]   Xavian Hardcastle E Christain [place] 02/26/2021, 11:46 AM

## 2021-02-26 NOTE — Evaluation (Signed)
Clinical/Bedside Swallow Evaluation Patient Details  Name: Jadalyn Oliveri MRN: [id number] Date of Birth: 1945-12-18  Today's Date: 02/26/2021 Time: SLP Start Time (ACUTE ONLY): 7893 SLP Stop Time (ACUTE ONLY): 0958 SLP Time Calculation (min) (ACUTE ONLY): 9 min  Past Medical History: History reviewed. No pertinent past medical history. Past Surgical History: History reviewed. No pertinent surgical history. HPI:  75 y.o. female with a PMHx of COPD, HTN and HLD presented for evaluation of acute onset confusion and aphasia. 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    Assessment / Plan / Recommendation  Clinical Impression  Pts oropharyngeal function appears grossly intact. No overt s/sx of aspiration with any PO this date. Pt able to follow some commands during oral motor exam with cueing however remains heavily aphasic (predominant receptive aphasia). At baseline pt has dentures, though per family (son, nephew) does not utilize dentures during PO consumption. No hx of dysphagia per family. Recommend regular thin liquid diet intiation. Will follow up x1 for swallowing to ensure diet tolerance.  SLP Visit Diagnosis: Dysphagia, unspecified (R13.10)    Aspiration Risk  Mild aspiration risk    Diet Recommendation   Regular, thin liquids  Medication Administration: Whole meds with liquid    Other  Recommendations Oral Care Recommendations: Oral care BID    Recommendations for follow up therapy are one component of a multi-disciplinary discharge planning process, led by the attending physician.  Recommendations may be updated based on patient status, additional functional criteria and insurance authorization.  Follow up Recommendations 24 hour supervision/assistance (TBD per PT/OT recs)      Frequency and Duration min 1 x/week  1 week       Prognosis Prognosis for Safe Diet Advancement: Good Barriers to Reach Goals: Language deficits      Swallow Study   General  Date of Onset: 02/24/21 HPI: 75 y.o. female with a PMHx of COPD, HTN and HLD presented for evaluation of acute onset confusion and aphasia. 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 Type of Study: Bedside Swallow Evaluation Previous Swallow Assessment: none on file Diet Prior to this Study: NPO Temperature Spikes Noted: No Respiratory Status: Nasal cannula History of Recent Intubation: Yes Length of Intubations (days): 1 days Date extubated: 02/25/21 Behavior/Cognition: Alert;Cooperative;Requires cueing (receptive aphasia persists) Oral Cavity Assessment: Within Functional Limits Oral Care Completed by SLP: No Vision: Functional for self-feeding Self-Feeding Abilities: Needs set up Patient Positioning: Upright in chair Baseline Vocal Quality: Normal Volitional Cough: Cognitively unable to elicit Volitional Swallow: Unable to elicit    Oral/Motor/Sensory Function Overall Oral Motor/Sensory Function: Within functional limits   Ice Chips Ice chips: Within functional limits Presentation: Spoon   Thin Liquid Thin Liquid: Within functional limits Presentation: Straw;Cup    Nectar Thick Nectar Thick Liquid: Not tested   Honey Thick Honey Thick Liquid: Not tested   Puree Puree: Within functional limits Presentation: Self Fed   Solid     Solid: Impaired Presentation: Self Fed Oral Phase Impairments: Impaired mastication Oral Phase Functional Implications: Impaired mastication     Jaceon Heiberger H. MA, CCC-SLP Acute Rehabilitation Services   02/26/2021,10:26 AM

## 2021-02-26 NOTE — Progress Notes (Signed)
Inpatient Rehab Admissions:  Inpatient Rehab Consult received.  I met with patient and her son, Michelle Dean at the bedside for rehabilitation assessment and to discuss goals and expectations of an inpatient rehab admission.  Due to her aphasia, unsure how much pt comprehended. Her son acknowledged understanding of goals and expectations. Son interested in pt pursuing CIR.  Will continue to follow.  Signed: Gayland Curry, [place], [place] Admissions Coordinator [phone]

## 2021-02-26 NOTE — Progress Notes (Signed)
Bilateral lower extremity venous duplex completed. Refer to "CV Proc" under chart review to view preliminary results.  02/26/2021 2:39 PM Eula Fried., MHA, RVT, RDCS, RDMS

## 2021-02-26 NOTE — Progress Notes (Signed)
Pharmacy called by this RN to reconcile order for 24 hour aspirin (per antiplatelet protocol) ordered despite aspirin listed as an allergy to the patient in her chart. Spoke with pharmacist, and she advised me to still administer the aspirin as it was just 81mg , and patient has no reported hx of anaphylaxis to the medication. RN will continue to monitor.  , RN

## 2021-02-26 NOTE — Progress Notes (Signed)
STROKE TEAM PROGRESS NOTE   SUBJECTIVE (INTERVAL HISTORY) Michelle Dean son and grandson are at the bedside.  Overall Michelle Dean condition is stable. She still has global aphasia but able to walk with PT/OT in the hallway without difficulty. Still NPO now, on cleviprex. Pending speech. Will d/c Aline.    OBJECTIVE Temp:  [97.2 F (36.2 C)-98.9 F (37.2 C)] 98 F (36.7 C) (10/18 0800) Pulse Rate:  [45-89] 84 (10/18 0900) Cardiac Rhythm: Normal sinus rhythm (10/18 0800) Resp:  [15-25] 24 (10/18 0900) BP: (105-182)/(55-127) 119/68 (10/18 0900) SpO2:  [90 %-100 %] 99 % (10/18 0900) Arterial Line BP: (117-175)/(57-89) 139/67 (10/18 0900) Weight:  [90.2 kg-99.8 kg] 90.2 kg (10/17 1514)  Recent Labs  Lab 02/25/21 1420  GLUCAP 99   Recent Labs  Lab 02/25/21 1441 02/26/21 0525  NA 141 142  K 3.4* 3.6  CL 107 109  CO2 26 25  GLUCOSE 113* 142*  BUN 21 10  CREATININE 0.89 0.75  CALCIUM 8.8* 8.4*   Recent Labs  Lab 02/25/21 1441  AST 20  ALT 14  ALKPHOS 56  BILITOT 0.6  PROT 6.8  ALBUMIN 3.9   Recent Labs  Lab 02/25/21 1441 02/26/21 0525  WBC 9.9 12.2*  NEUTROABS 6.2 10.2*  HGB 13.6 12.4  HCT 43.6 38.6  MCV 95.8 93.9  PLT 225 222   No results for input(s): CKTOTAL, CKMB, CKMBINDEX, TROPONINI in the last 168 hours. Recent Labs    02/25/21 1441  LABPROT 13.0  INR 1.0   Recent Labs    02/26/21 0256  COLORURINE YELLOW  LABSPEC >1.046*  PHURINE 6.0  GLUCOSEU NEGATIVE  HGBUR LARGE*  BILIRUBINUR NEGATIVE  KETONESUR 20*  PROTEINUR NEGATIVE  NITRITE NEGATIVE  LEUKOCYTESUR NEGATIVE       Component Value Date/Time   CHOL 131 02/26/2021 0525   TRIG 55 02/26/2021 0525   HDL 57 02/26/2021 0525   CHOLHDL 2.3 02/26/2021 0525   VLDL 11 02/26/2021 0525   LDLCALC 63 02/26/2021 0525   Lab Results  Component Value Date   HGBA1C 5.5 02/26/2021      Component Value Date/Time   LABOPIA NONE DETECTED 02/26/2021 0256   COCAINSCRNUR NONE DETECTED 02/26/2021 0256   LABBENZ NONE  DETECTED 02/26/2021 0256   AMPHETMU NONE DETECTED 02/26/2021 0256   THCU NONE DETECTED 02/26/2021 0256   LABBARB NONE DETECTED 02/26/2021 0256    Recent Labs  Lab 02/25/21 1441  ETH <10    I have personally reviewed the radiological images below and agree with the radiology interpretations.  CT HEAD WO CONTRAST ( )  Result Date: 02/26/2021 CLINICAL DATA:  Stroke follow-up EXAM: CT HEAD WITHOUT CONTRAST TECHNIQUE: Contiguous axial images were obtained from the base of the skull through the vertex without intravenous contrast. COMPARISON:  None. FINDINGS: Brain: High-density material in the left temporal and posterior frontal lobe, status post interval interventional thrombectomy. Area of hypodensity in the left temporal lobe, most likely edema. No new cortical infarct. No hydrocephalus, extra-axial collection, mass, mass effect, or midline shift. Vascular: No hyperdense vessel or unexpected calcification. Skull: Normal. Negative for fracture or focal lesion. Sinuses/Orbits: No acute finding. Other: None. IMPRESSION: 1. Hyperdense material in the left temporal and frontal lobes, in an area that has recently been treated with interventional thrombectomy, favored to represent contrast staining and not hemorrhage. Recommend follow-up exam in 6 hours for further evaluation. 2. Hypodensity in the left temporal lobe, possibly edema from recent infarct. These results were called by telephone at the time of  interpretation on 02/26/2021 at 3:02 Am to provider [facility] , who verbally acknowledged these results. Electronically Signed   By: Wiliam Ke M.D.   On: 02/26/2021 03:03   CT HEAD CODE STROKE WO CONTRAST  Result Date: 02/25/2021 CLINICAL DATA:  Code stroke. EXAM: CT HEAD WITHOUT CONTRAST TECHNIQUE: Contiguous axial images were obtained from the base of the skull through the vertex without intravenous contrast. COMPARISON:  None. FINDINGS: Brain: No acute intracranial hemorrhage, mass  effect, or edema. Gray-white differentiation is preserved. Age-indeterminate small vessel infarct of the left gangliocapsular region. Additional patchy low-density in the supratentorial white matter is nonspecific but may reflect mild chronic microvascular ischemic changes. Ventricles and sulci are within normal limits in size and configuration. No extra-axial collection. Vascular: No hyperdense vessel. Mild intracranial atherosclerotic calcification at the skull base. Skull: Unremarkable. Sinuses/Orbits: Aerated. Other: Mastoid air cells are clear. ASPECTS (Alberta Stroke Program Early CT Score) - Ganglionic level infarction (caudate, lentiform nuclei, internal capsule, insula, M1-M3 cortex): 7 - Supraganglionic infarction (M4-M6 cortex): 3 Total score (0-10 with 10 being normal): 10 IMPRESSION: There is no acute intracranial hemorrhage or evidence of acute infarction. ASPECT score is 10. Age-indeterminate small vessel infarct of the left gangliocapsular region. These results were called by telephone at the time of interpretation on 02/25/2021 at 2:37 pm to provider Tanda Rockers , who verbally acknowledged these results. Electronically Signed   By: Guadlupe Spanish M.D.   On: 02/25/2021 14:39   CT ANGIO HEAD NECK W WO CM W PERF (CODE STROKE)  Result Date: 02/25/2021 CLINICAL DATA:  Neuro deficit, acute, stroke suspected EXAM: CT ANGIOGRAPHY HEAD AND NECK CT PERFUSION BRAIN TECHNIQUE: Multidetector CT imaging of the head and neck was performed using the standard protocol during bolus administration of intravenous contrast. Multiplanar CT image reconstructions and MIPs were obtained to evaluate the vascular anatomy. Carotid stenosis measurements (when applicable) are obtained utilizing NASCET criteria, using the distal internal carotid diameter as the denominator. Multiphase CT imaging of the brain was performed following IV bolus contrast injection. Subsequent parametric perfusion maps were calculated using RAPID  software. CONTRAST:  OMNIPAQUE IOHEXOL 350 MG/ML SOLN COMPARISON:  None. FINDINGS: CTA NECK Aortic arch: Trace calcified plaque along the included arch. Great vessel origins are patent. Right carotid system: Patent.  No stenosis. Left carotid system: Patent. Trace mixed plaque at the bifurcation. No stenosis. Vertebral arteries: Patent and codominant.  No stenosis. Skeleton: Mild cervical spine degenerative changes. Degenerative changes at the temporomandibular joints. Other neck: Unremarkable. Upper chest: Centrilobular emphysema. Nodular but somewhat tubular opacities at the left apex probably reflect impacted airways. Review of the MIP images confirms the above findings CTA HEAD Anterior circulation: Intracranial internal carotid arteries are patent with mild calcified plaque. Left M1 MCA is patent. There is occlusion of a distal left M2 MCA branch within the posterior sylvian fissure. This may reconstitute more distally. Anterior and right middle cerebral arteries are patent. Posterior circulation: Intracranial vertebral arteries are patent. Basilar artery is patent. Major cerebellar artery origins are patent. A left posterior communicating artery is present. Posterior cerebral arteries are patent. Venous sinuses: Patent as allowed by contrast bolus timing. Review of the MIP images confirms the above findings CT Brain Perfusion Findings: CBF (<30%) Volume: 12mL Perfusion (Tmax>6.0s) volume: 27mL Mismatch Volume: 25mL Infarction Location: Area of infarction and most territory at risk within the posterior left MCA territory. There is some artifactual calculated territory at risk within the inferior frontal lobes. IMPRESSION: Occlusion of distal left M2  within the posterior sylvian fissure. May reconstitute more distally. Perfusion imaging demonstrates 3 mL of core infarction and 18 mL of penumbra within the posterior left MCA territory. The penumbra volume is overestimated as there is some artifact in the  inferior frontal lobes included in the number. No occlusion or hemodynamically significant stenosis in the neck. These results were provided by telephone at the time of interpretation on 02/25/2021 at 3:55 pm to provider [facility] , who verbally acknowledged these results. Electronically Signed   By: Guadlupe Spanish M.D.   On: 02/25/2021 16:01     PHYSICAL EXAM  Temp:  [97.2 F (36.2 C)-98.9 F (37.2 C)] 98 F (36.7 C) (10/18 0800) Pulse Rate:  [45-89] 84 (10/18 0900) Resp:  [15-25] 24 (10/18 0900) BP: (105-182)/(55-127) 119/68 (10/18 0900) SpO2:  [90 %-100 %] 99 % (10/18 0900) Arterial Line BP: (117-175)/(57-89) 139/67 (10/18 0900) Weight:  [90.2 kg-99.8 kg] 90.2 kg (10/17 1514)  General - Well nourished, well developed, in no apparent distress.  Ophthalmologic - fundi not visualized due to noncooperation.  Cardiovascular - Regular rhythm and rate.  Neuro - awake, alert, global aphasia, not following commands, with word salad but intermittent meaningful sentences, not able to name and repeat. No gaze palsy, tracking bilaterally, not consistently blinking to visual threat bilaterally, PERRL. Slight right nasolabial fold flatterning. Tongue protrusion not cooperative. Bilateral UEs 5/5, no drift. Bilaterally LEs 5/5, no drift. Sensation, coordination not cooperative and gait not tested.    ASSESSMENT/PLAN Michelle Dean is a 75 y.o. female with history of HTN, HLD admitted for aphasia. 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  MRI  pending 2D Echo  pending LE venous doppler pending Will consider loop recorder if embolic work up neg LDL 63 HgbA1c 5.5 UDS  neg SCDs for VTE prophylaxis No antithrombotic prior to admission, now on No antithrombotic within 24h of tPA Ongoing aggressive stroke risk factor management Therapy recommendations:  CIR Disposition:  pending  Hypertension Stable On cleviprex BP goal 120-140 within 24h of procedure Long term BP goal normotensive  Hyperlipidemia Home meds:  lipitor 10  LDL 63, goal < 70 Now on lipitor 10 Continue statin at discharge  Dysphagia  NPO now Pending speech evaluation On IVF  Other Stroke Risk Factors Advanced age Obesity, Body mass index is 31.15 kg/m.   Other Active Problems Leukocytosis WBC 12.2  Hospital day # 1  This patient is critically ill due to stroke s/p TNK and IR, dysphagia and at significant risk of neurological worsening, death form bleeding from TNK, recurrent stroke, hemorrhagic conversion, aspiration. This patient's care requires constant monitoring of vital signs, hemodynamics, respiratory and cardiac monitoring, review of multiple databases, neurological assessment, discussion with family, other specialists and medical decision making of high complexity. I spent 45 minutes of neurocritical care time in the care of this patient. I had long discussion with son and grandson at bedside, updated pt current condition, treatment plan and potential prognosis, and answered all the questions. They expressed understanding and appreciation.     Marvel Plan, MD PhD Stroke Neurology 02/26/2021 9:52 AM    To contact Stroke Continuity provider, please refer to [email or web address]. After hours, contact  General Neurology

## 2021-02-26 NOTE — TOC CAGE-AID Note (Signed)
Transition of Care [facility]) - CAGE-AID Screening   Patient Details  Name: Michelle Dean MRN: [id number] Date of Birth: Jan 10, 1946  Transition of Care [facility]) CM/SW Contact:    Marcell Chavarin C Dean, LCSWA Phone Number: 02/26/2021, 10:30 AM   Clinical Narrative: Pt is unable to participate in Cage Aid.  Michelle Dean, MSW, LCSW-A Pronouns:  She/Her/Hers [facility]itions [facility] Care Clinical Social Worker Direct Number:  [phone] [email or web address]  CAGE-AID Screening: Substance Abuse Screening unable to be completed due to: : Patient unable to participate             Substance Abuse Education Offered: No

## 2021-02-26 NOTE — Progress Notes (Signed)
Referring Physician(s): Code stroke  Supervising Physician: Julieanne Cotton  Patient Status:  [facility] - In-pt  Chief Complaint: Follow up left MCA thrombectomy 10/17 in NIR  Subjective:  Patient sitting in chair, alert, answers questions when asked but responds with non sensical answers. No family/staff at bedside.  Allergies: Aspirin, Penicillins, and Lisinopril  Medications: Prior to Admission medications   Medication Sig Start Date End Date Taking? Authorizing Provider  albuterol (PROVENTIL) (2.5 MG/3ML) 0.083% nebulizer solution Take 2.5 mg by nebulization every 4 (four) hours as needed for wheezing. 11/21/19  Yes [provider]  atorvastatin (LIPITOR) 10 MG tablet Take 10 mg by mouth daily. 12/24/20  Yes [provider]  carboxymethylcellulose (REFRESH PLUS) 0.5 % SOLN Place 1 drop into both eyes 4 (four) times daily as needed (dry eyes).   Yes [provider]  DULERA 100-5 MCG/ACT AERO Inhale 2 puffs into the lungs 2 (two) times daily. 02/14/21  Yes [provider]  losartan (COZAAR) 25 MG tablet Take 25 mg by mouth daily. 02/21/21  Yes [provider]  Multiple Vitamin (MULTIVITAMIN) tablet Take 1 tablet by mouth daily.   Yes [provider]  omeprazole (PRILOSEC) 20 MG capsule Take 20 mg by mouth daily. 02/04/21  Yes [provider]  tiotropium (SPIRIVA HANDIHALER) 18 MCG inhalation capsule Place 18 mcg into inhaler and inhale daily. 01/29/21  Yes [provider]     Vital Signs: BP 132/66   Pulse 84   Temp 97.9 F (36.6 C) (Oral)   Resp (!) 32   Ht 5\' 7"  (1.702 m)   Wt 198 lb 14.4 oz (90.2 kg)   SpO2 100%   BMI 31.15 kg/m   Physical Exam Vitals and nursing note reviewed.  HENT:     Head: Normocephalic.  Cardiovascular:     Rate and Rhythm: Normal rate.     Comments: (+) right CFA puncture site clean, dry, dressed appropriately. Soft, nontender, non pulstaile, no active bleeding or  drainage.  Pulmonary:     Effort: Pulmonary effort is normal.  Skin:    General: Skin is warm and dry.  Neurological:     Mental Status: She is alert.  Alert, awake, unable to assess orientation 2/2 aphasia Expressive aphasia when asked questions - often responds "yes" but cannot elaborate. Able to ask "Can I have my water?" Does not follow commands but does acknowledge that she is being asked to do something. PE EOMs without  nystagmus or subjective diplopia. Visual fields grossly in tact No facial asymmetry. Tongue midline - not assessed does not follow commands Motor power - not assessed does not follow commands Fine motor and coordination - not assessed does not follow commands    Imaging: CT HEAD WO CONTRAST ( )  Result Date: 02/26/2021 CLINICAL DATA:  Stroke follow-up EXAM: CT HEAD WITHOUT CONTRAST TECHNIQUE: Contiguous axial images were obtained from the base of the skull through the vertex without intravenous contrast. COMPARISON:  None. FINDINGS: Brain: High-density material in the left temporal and posterior frontal lobe, status post interval interventional thrombectomy. Area of hypodensity in the left temporal lobe, most likely edema. No new cortical infarct. No hydrocephalus, extra-axial collection, mass, mass effect, or midline shift. Vascular: No hyperdense vessel or unexpected calcification. Skull: Normal. Negative for fracture or focal lesion. Sinuses/Orbits: No acute finding. Other: None. IMPRESSION: 1. Hyperdense material in the left temporal and frontal lobes, in an area that has recently been treated with interventional thrombectomy, favored to represent contrast staining  and not hemorrhage. Recommend follow-up exam in 6 hours for further evaluation. 2. Hypodensity in the left temporal lobe, possibly edema from recent infarct. These results were called by telephone at the time of interpretation on 02/26/2021 at 3:02 Am to provider [facility] , who verbally  acknowledged these results. Electronically Signed   By: Wiliam Ke M.D.   On: 02/26/2021 03:03   CT HEAD CODE STROKE WO CONTRAST  Result Date: 02/25/2021 CLINICAL DATA:  Code stroke. EXAM: CT HEAD WITHOUT CONTRAST TECHNIQUE: Contiguous axial images were obtained from the base of the skull through the vertex without intravenous contrast. COMPARISON:  None. FINDINGS: Brain: No acute intracranial hemorrhage, mass effect, or edema. Gray-white differentiation is preserved. Age-indeterminate small vessel infarct of the left gangliocapsular region. Additional patchy low-density in the supratentorial white matter is nonspecific but may reflect mild chronic microvascular ischemic changes. Ventricles and sulci are within normal limits in size and configuration. No extra-axial collection. Vascular: No hyperdense vessel. Mild intracranial atherosclerotic calcification at the skull base. Skull: Unremarkable. Sinuses/Orbits: Aerated. Other: Mastoid air cells are clear. ASPECTS (Alberta Stroke Program Early CT Score) - Ganglionic level infarction (caudate, lentiform nuclei, internal capsule, insula, M1-M3 cortex): 7 - Supraganglionic infarction (M4-M6 cortex): 3 Total score (0-10 with 10 being normal): 10 IMPRESSION: There is no acute intracranial hemorrhage or evidence of acute infarction. ASPECT score is 10. Age-indeterminate small vessel infarct of the left gangliocapsular region. These results were called by telephone at the time of interpretation on 02/25/2021 at 2:37 pm to provider Tanda Rockers , who verbally acknowledged these results. Electronically Signed   By: Guadlupe Spanish M.D.   On: 02/25/2021 14:39   CT ANGIO HEAD NECK W WO CM W PERF (CODE STROKE)  Result Date: 02/25/2021 CLINICAL DATA:  Neuro deficit, acute, stroke suspected EXAM: CT ANGIOGRAPHY HEAD AND NECK CT PERFUSION BRAIN TECHNIQUE: Multidetector CT imaging of the head and neck was performed using the standard protocol during bolus administration  of intravenous contrast. Multiplanar CT image reconstructions and MIPs were obtained to evaluate the vascular anatomy. Carotid stenosis measurements (when applicable) are obtained utilizing NASCET criteria, using the distal internal carotid diameter as the denominator. Multiphase CT imaging of the brain was performed following IV bolus contrast injection. Subsequent parametric perfusion maps were calculated using RAPID software. CONTRAST:  OMNIPAQUE IOHEXOL 350 MG/ML SOLN COMPARISON:  None. FINDINGS: CTA NECK Aortic arch: Trace calcified plaque along the included arch. Great vessel origins are patent. Right carotid system: Patent.  No stenosis. Left carotid system: Patent. Trace mixed plaque at the bifurcation. No stenosis. Vertebral arteries: Patent and codominant.  No stenosis. Skeleton: Mild cervical spine degenerative changes. Degenerative changes at the temporomandibular joints. Other neck: Unremarkable. Upper chest: Centrilobular emphysema. Nodular but somewhat tubular opacities at the left apex probably reflect impacted airways. Review of the MIP images confirms the above findings CTA HEAD Anterior circulation: Intracranial internal carotid arteries are patent with mild calcified plaque. Left M1 MCA is patent. There is occlusion of a distal left M2 MCA branch within the posterior sylvian fissure. This may reconstitute more distally. Anterior and right middle cerebral arteries are patent. Posterior circulation: Intracranial vertebral arteries are patent. Basilar artery is patent. Major cerebellar artery origins are patent. A left posterior communicating artery is present. Posterior cerebral arteries are patent. Venous sinuses: Patent as allowed by contrast bolus timing. Review of the MIP images confirms the above findings CT Brain Perfusion Findings: CBF (<30%) Volume: 33mL Perfusion (Tmax>6.0s) volume: 48mL Mismatch Volume: 92mL Infarction  Location: Area of infarction and most territory at risk within  the posterior left MCA territory. There is some artifactual calculated territory at risk within the inferior frontal lobes. 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 May reconstitute more distally. Perfusion imaging demonstrates 3 mL of core infarction and 18 mL of penumbra within the posterior left MCA territory. The penumbra volume is overestimated as there is some artifact in the inferior frontal lobes included in the number. No occlusion or hemodynamically significant stenosis in the neck. These results were provided by telephone at the time of interpretation on 02/25/2021 at 3:55 pm to provider [facility] , who verbally acknowledged these results. Electronically Signed   By: Guadlupe Spanish M.D.   On: 02/25/2021 16:01    Labs:  CBC: Recent Labs    02/25/21 1441 02/26/21 0525  WBC 9.9 12.2*  HGB 13.6 12.4  HCT 43.6 38.6  PLT 225 222    COAGS: Recent Labs    02/25/21 1441  INR 1.0  APTT 23*    BMP: Recent Labs    02/25/21 1441 02/26/21 0525  NA 141 142  K 3.4* 3.6  CL 107 109  CO2 26 25  GLUCOSE 113* 142*  BUN 21 10  CALCIUM 8.8* 8.4*  CREATININE 0.89 0.75  GFRNONAA >60 >60    LIVER FUNCTION TESTS: Recent Labs    02/25/21 1441  BILITOT 0.6  AST 20  ALT 14  ALKPHOS 56  PROT 6.8  ALBUMIN 3.9    Assessment and Plan:  75 y/o F presented to [facility] as Code stroke on 10/17 s/p left MCA thrombectomy with Dr. Corliss Skains seen today for puncture site check.  Patient with expressive/receptive aphasia on exam, does not follow commands but does acknowledge she is being asked to do something. Right CFA puncture site unremarkable, soft, non pulsatile.  No further NIR needs at this time, NIR remains available as needed - further plans per neurology/primary team.  Electronically Signed: Villa Herb, PA-C 02/26/2021, 12:43 PM   I spent a total of 15 Minutes at the the patient's bedside AND on the patient's hospital floor or  unit, greater than 50% of which was counseling/coordinating care for left MCA thrombectomy follow up.

## 2021-02-26 NOTE — Progress Notes (Signed)
Patient's BP at 2030 was 68/29 with MAP of 39. Re-positioned patient and cuff and new BP was 104/52 with MAP of 67 at 2033. Paged Dr. Thomasena Edis to discuss potential need for pressor to keep patient within BP parameters. Dr. Thomasena Edis gave verbal order for me to give 1L NS bolus. MD also stated SBP goal above 110 would be sufficient. 1L bolus given and patient's new BP at 2200 is 120/66 with MAP of 83. RN will continue to monitor.   Harriett Sine, RN

## 2021-02-26 NOTE — Evaluation (Signed)
Speech Language Pathology Evaluation Patient Details Name: Michelle Dean MRN: [id number] DOB: November 29, 1945 Today's Date: 02/26/2021 Time: 2993-7169 SLP Time Calculation (min) (ACUTE ONLY): 16 min  Problem List:  Patient Active Problem List   Diagnosis Date Noted   Acute ischemic left MCA stroke (HCC) 02/25/2021   Stroke (cerebrum) (HCC) 02/25/2021   Middle cerebral artery embolism, 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   Past Medical History: History reviewed. No pertinent past medical history. Past Surgical History: History reviewed. No pertinent surgical history. HPI:  75 y.o. female with a PMHx of COPD, HTN and HLD presented for evaluation of acute onset confusion and aphasia. 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   Assessment / Plan / Recommendation Clinical Impression  Pt presents with a severe receptive/predominant Wernicke's aphasia. Cannot exclude some expressive language difficulties however pt with spontaneous verbal output. Accuracy of recpetive language during simple tasks including yes/no biographical questions, and simple commands remained less than 25 percent. With cueing, including visual and gestural cues, pt able to improve accuracy some. Pts son and nephew at bedside, provided context to pts PLOF. Previously pt lived alone, very independent with all ADLs. Pt with supportive family and neighbors at [place]. Due to significant impairments in receptive abilities, cognitive function was very challenging to formally assess. No motor speech issues exhibited. Will continue to follow for intervention for deficits noted above.    SLP Assessment  SLP Recommendation/Assessment: Patient needs continued Speech Lanaguage Pathology Services SLP Visit Diagnosis: Aphasia (R47.01);Frontal lobe and executive function deficit Frontal lobe and executive function deficit following: Cerebral infarction    Recommendations for follow up therapy are one component of a multi-disciplinary discharge  planning process, led by the attending physician.  Recommendations may be updated based on patient status, additional functional criteria and insurance authorization.    Follow Up Recommendations  Outpatient SLP;24 hour supervision/assistance;Home health SLP (TBD)    Frequency and Duration min 2x/week  2 weeks      SLP Evaluation Cognition  Overall Cognitive Status: Difficult to assess (severe receptive aphasia noted impairing ability for formal cognitive testing) Arousal/Alertness: Awake/alert Orientation Level: Oriented to person;Disoriented to place;Disoriented to time;Disoriented to situation Attention: Focused Focused Attention: Impaired Memory:  (very challenging to assess will require additional clinical interaction) Awareness: Impaired Problem Solving: Impaired Executive Function: Organizing;Self Monitoring Organizing: Impaired Safety/Judgment: Impaired       Comprehension  Auditory Comprehension Overall Auditory Comprehension: Impaired Yes/No Questions: Impaired Basic Biographical Questions: 0-25% accurate Basic Immediate Environment Questions: 0-24% accurate Commands: Impaired One Step Basic Commands: 0-24% accurate EffectiveTechniques: Visual/Gestural cues;Repetition;Extra processing time    Expression Expression Primary Mode of Expression: Verbal Verbal Expression Overall Verbal Expression: Impaired Repetition: Impaired Written Expression Dominant Hand: Right   Oral / Motor  Oral Motor/Sensory Function Overall Oral Motor/Sensory Function: Within functional limits Motor Speech Overall Motor Speech: Appears within functional limits for tasks assessed   GO                    Ardyth Gal MA, CCC-SLP Acute Rehabilitation Services   02/26/2021, 10:41 AM

## 2021-02-26 NOTE — Evaluation (Signed)
Occupational Therapy Evaluation Patient Details Name: Michelle Dean MRN: [id number] DOB: [birth year] Today's Date: 02/26/2021   History of Present Illness 75 yo female presents to [facility] on 10/17 with aphasia. Imaging reveals Left MCA distal M2 stroke, currently embolic of undetermined etiology, transferred to [facility] for intervention. s/p L common carotid arteriogram via R CFA, partial recanalization with reocclusion with x 1 pass with 40 mm solitaireX retriever and contact aspiration,x1 pass with contact aspiration and x1 pass with mechanical thrombolysis  acieving a TICI 2C revascularization on 10/17. PMH includes COPD (intermittently uses oxygen at home), hypertension, hyperlipidemia.   Clinical Impression   PT admitted with CVA. Pt currently with functional limitiations due to the deficits listed below (see OT problem list). Pt was independent and driving prior to admission. Pt is very automatic with similar task and will progress quickly. Pt shows some R inattention and decreased scanning. Pt responds "fine" and "thank you" as a response to staff when presented with new task. OT rotating head to the R so chair was in visual field and pt states "oh you want me to get over there?" Pt then starts to initiate task.  Pt will benefit from skilled OT to increase their independence and safety with adls and balance to allow discharge CIR.       Recommendations for follow up therapy are one component of a multi-disciplinary discharge planning process, led by the attending physician.  Recommendations may be updated based on patient status, additional functional criteria and insurance authorization.   Follow Up Recommendations  CIR    Equipment Recommendations  3 in 1 bedside commode;Other (comment) (RW)    Recommendations for Other Services Rehab consult     Precautions / Restrictions Precautions Precautions: Fall Precaution Comments: receptive aphasia Restrictions Weight Bearing Restrictions:  No      Mobility Bed Mobility Overal bed mobility: Needs Assistance Bed Mobility: Supine to Sit     Supine to sit: Min assist;HOB elevated     General bed mobility comments: min assist for trunk elevation via HHA, translating LEs to EOB. Increased time, responds best to demonstrative and tactile cuing.    Transfers Overall transfer level: Needs assistance Equipment used: 1 person hand held assist Transfers: Sit to/from Stand Sit to Stand: Min assist;+2 safety/equipment         General transfer comment: min assist to rise and steady, +2 for safety STS x2, from EOB and BSC over toilet.    Balance Overall balance assessment: Needs assistance Sitting-balance support: No upper extremity supported;Feet supported Sitting balance-Leahy Scale: Fair Sitting balance - Comments: able to sit EOB without assist   Standing balance support: No upper extremity supported;During functional activity Standing balance-Leahy Scale: Fair Standing balance comment: cannot accept challenge                           ADL either performed or assessed with clinical judgement   ADL Overall ADL's : Needs assistance/impaired Eating/Feeding: NPO                       Toilet Transfer: +2 for physical assistance;Minimal assistance   Toileting- Clothing Manipulation and Hygiene: Moderate assistance;Sit to/from stand Toileting - Clothing Manipulation Details (indicate cue type and reason): (A) for balance and get the toilet paper loose     Functional mobility during ADLs: +2 for physical assistance;Minimal assistance;Rolling walker General ADL Comments: pt progressed in session after several attempts with RW and becoming  more +1 with RW     Vision Baseline Vision/History: 1 Wears glasses Ability to See in Adequate Light:  (reading only) Vision Assessment?: Yes Additional Comments: noted to have R inattention. not formally tested due to receptive language. using functional task.      Perception Perception Perception Tested?: Yes Perception Deficits: Inattention/neglect Inattention/Neglect: Does not attend to right side of body Comments: walking into the door frame on the right   Praxis      Pertinent Vitals/Pain Pain Assessment: Faces Faces Pain Scale: No hurt Pain Intervention(s): Monitored during session     Hand Dominance Right   Extremity/Trunk Assessment Upper Extremity Assessment Upper Extremity Assessment: Overall WFL for tasks assessed   Lower Extremity Assessment Lower Extremity Assessment: Defer to PT evaluation   Cervical / Trunk Assessment Cervical / Trunk Assessment: Normal   Communication Communication Communication: No difficulties;Receptive difficulties   Cognition Arousal/Alertness: Awake/alert Behavior During Therapy: WFL for tasks assessed/performed Overall Cognitive Status: Difficult to assess                                 General Comments: significant receptive aphasia, if the task is familiar very automatic ( toileting, chair transfer) if the questions or task is something new or not a visual cue does not respond to the command. Pt expressing her needs during session such as bathroom transfer to pee   General Comments  VSS, decrease RA 88% placed back on 3L [place] at end of session    Exercises     Shoulder Instructions      Home Living Family/patient expects to be discharged to:: Private residence Living Arrangements: Alone Available Help at Discharge: Family;Available 24 hours/day Type of Home: House Home Access: Stairs to enter [employer] of Steps: 1 Entrance Stairs-Rails: Right Home Layout: One level     Bathroom Shower/Tub: Chief Strategy Officer: Standard     Home Equipment: None      Lives With: Alone    Prior Functioning/Environment Level of Independence: Independent                 OT Problem List: Decreased strength;Decreased activity  tolerance;Impaired balance (sitting and/or standing);Decreased cognition;Decreased safety awareness;Decreased knowledge of use of DME or AE;Decreased knowledge of precautions      OT Treatment/Interventions: Self-care/ADL training;Therapeutic exercise;Neuromuscular education;Energy conservation;DME and/or AE instruction;Manual therapy;Therapeutic activities;Cognitive remediation/compensation;Patient/family education;Balance training    OT Goals(Current goals can be found in the care plan section) Acute Rehab OT Goals Patient Stated Goal: to return back home. OT Goal Formulation: With patient/family Time For Goal Achievement: 03/12/21 Potential to Achieve Goals: Good  OT Frequency: Min 2X/week   Barriers to D/C: Decreased caregiver support  family says they can let her come stay with them. Son with questions about outcome       Co-evaluation PT/OT/SLP Co-Evaluation/Treatment: Yes Reason for Co-Treatment: For patient/therapist safety;Necessary to address cognition/behavior during functional activity;To address functional/ADL transfers PT goals addressed during session: Mobility/safety with mobility;Balance OT goals addressed during session: ADL's and self-care;Proper use of Adaptive equipment and DME;Strengthening/ROM      AM-PAC OT "6 Clicks" Daily Activity     Outcome Measure Help from another person eating meals?: A Little Help from another person taking care of personal grooming?: A Little Help from another person toileting, which includes using toliet, bedpan, or urinal?: A Little Help from another person bathing (including washing, rinsing, drying)?: A Little Help from another person  to put on and taking off regular upper body clothing?: A Little Help from another person to put on and taking off regular lower body clothing?: A Lot 6 Click Score: 17   End of Session Equipment Utilized During Treatment: Gait belt;Rolling walker;Oxygen Nurse Communication: Mobility  status;Precautions  Activity Tolerance: Patient tolerated treatment well Patient left: in chair;with call bell/phone within reach;with chair alarm set;with family/visitor present  OT Visit Diagnosis: Unsteadiness on feet (R26.81);Muscle weakness (generalized) (M62.81)                Time: 9449-6759 OT Time Calculation (min): 30 min Charges:  OT General Charges $OT Visit: 1 Visit OT Evaluation $OT Eval Moderate Complexity: 1 Mod   Brynn, OTR/L  Acute Rehabilitation Services Pager: [phone] Office: [phone] .   Mateo Flow 02/26/2021, 1:42 PM

## 2021-02-26 NOTE — Progress Notes (Signed)
Echocardiogram 2D Echocardiogram has been performed.  Warren Lacy Carney Saxton RDCS 02/26/2021, 12:30 PM

## 2021-02-26 NOTE — Progress Notes (Signed)
Inpatient Rehab Admissions Coordinator:   Per therapy recommendations,  patient was screened for CIR candidacy by Azelyn Batie, MS, CCC-SLP . At this time, Pt. Appears to demonstrate medical necessity, functional decline, and ability to tolerate intensity of CIR. Pt. is a potential candidate for CIR. I will place   order for rehab consult per protocol for full assessment. Please contact me any with questions..  Lupe Handley, MS, CCC-SLP Rehab Admissions Coordinator  [phone] (celll) [phone] (office)  

## 2021-02-27 ENCOUNTER — Other Ambulatory Visit: Payer: Self-pay

## 2021-02-27 DIAGNOSIS — I10 Essential (primary) hypertension: Secondary | ICD-10-CM

## 2021-02-27 DIAGNOSIS — I63512 Cerebral infarction due to unspecified occlusion or stenosis of left middle cerebral artery: Secondary | ICD-10-CM | POA: Diagnosis not present

## 2021-02-27 DIAGNOSIS — I4892 Unspecified atrial flutter: Secondary | ICD-10-CM

## 2021-02-27 DIAGNOSIS — I639 Cerebral infarction, unspecified: Secondary | ICD-10-CM

## 2021-02-27 DIAGNOSIS — I6602 Occlusion and stenosis of left middle cerebral artery: Secondary | ICD-10-CM | POA: Diagnosis not present

## 2021-02-27 DIAGNOSIS — I483 Typical atrial flutter: Secondary | ICD-10-CM

## 2021-02-27 DIAGNOSIS — I480 Paroxysmal atrial fibrillation: Secondary | ICD-10-CM

## 2021-02-27 DIAGNOSIS — R4701 Aphasia: Secondary | ICD-10-CM

## 2021-02-27 DIAGNOSIS — E782 Mixed hyperlipidemia: Secondary | ICD-10-CM

## 2021-02-27 DIAGNOSIS — I4891 Unspecified atrial fibrillation: Secondary | ICD-10-CM

## 2021-02-27 LAB — HEPARIN LEVEL (UNFRACTIONATED): Heparin Unfractionated: 0.17 IU/mL — ABNORMAL LOW (ref 0.30–0.70)

## 2021-02-27 LAB — TSH: TSH: 0.633 u[IU]/mL (ref 0.350–4.500)

## 2021-02-27 MED ORDER — AMIODARONE HCL IN DEXTROSE 360-4.14 MG/200ML-% IV SOLN
30.0000 mg/h | INTRAVENOUS | Status: DC
  Administered 2021-02-27 (×3): 60 mg/h via INTRAVENOUS
  Administered 2021-02-28 (×2): 30 mg/h via INTRAVENOUS
  Administered 2021-02-28: 60 mg/h via INTRAVENOUS
  Filled 2021-02-27 (×6): qty 200

## 2021-02-27 MED ORDER — AMIODARONE HCL IN DEXTROSE 360-4.14 MG/200ML-% IV SOLN: 30.0000 mg/h | INTRAVENOUS | Status: DC

## 2021-02-27 MED ORDER — POTASSIUM CHLORIDE 20 MEQ PO PACK
40.0000 meq | PACK | Freq: Once | ORAL | Status: AC
  Administered 2021-02-27: 40 meq via ORAL
  Filled 2021-02-27: qty 2

## 2021-02-27 MED ORDER — METOPROLOL TARTRATE 25 MG PO TABS
25.0000 mg | ORAL_TABLET | Freq: Three times a day (TID) | ORAL | Status: DC
  Administered 2021-02-27: 25 mg via ORAL
  Filled 2021-02-27: qty 1

## 2021-02-27 MED ORDER — DILTIAZEM HCL-DEXTROSE 125-5 MG/125ML-% IV SOLN (PREMIX)
5.0000 mg/h | INTRAVENOUS | Status: DC
  Administered 2021-02-27: 5 mg/h via INTRAVENOUS
  Filled 2021-02-27: qty 125

## 2021-02-27 MED ORDER — HEPARIN (PORCINE) 25000 UT/250ML-% IV SOLN
1250.0000 [IU]/h | INTRAVENOUS | Status: AC
  Administered 2021-02-27: 1300 [IU]/h via INTRAVENOUS
  Administered 2021-02-28: 1450 [IU]/h via INTRAVENOUS
  Administered 2021-03-01: 1400 [IU]/h via INTRAVENOUS
  Administered 2021-03-02: 1250 [IU]/h via INTRAVENOUS
  Filled 2021-02-27 (×4): qty 250

## 2021-02-27 MED ORDER — AMIODARONE LOAD VIA INFUSION
150.0000 mg | Freq: Once | INTRAVENOUS | Status: AC
  Administered 2021-02-27: 150 mg via INTRAVENOUS
  Filled 2021-02-27: qty 83.34

## 2021-02-27 MED ORDER — AMIODARONE HCL IN DEXTROSE 360-4.14 MG/200ML-% IV SOLN
60.0000 mg/h | INTRAVENOUS | Status: DC
  Administered 2021-02-27 (×2): 60 mg/h via INTRAVENOUS
  Filled 2021-02-27 (×2): qty 200

## 2021-02-27 MED ORDER — DILTIAZEM HCL 30 MG PO TABS
30.0000 mg | ORAL_TABLET | Freq: Three times a day (TID) | ORAL | Status: DC
  Administered 2021-02-27 – 2021-02-28 (×3): 30 mg via ORAL
  Filled 2021-02-27 (×5): qty 1

## 2021-02-27 MED ORDER — LEVETIRACETAM IN NACL 1000 MG/100ML IV SOLN: 1000.0000 mg | Freq: Once | INTRAVENOUS | Status: DC

## 2021-02-27 MED ORDER — METOPROLOL TARTRATE 25 MG PO TABS
12.5000 mg | ORAL_TABLET | Freq: Two times a day (BID) | ORAL | Status: DC
  Administered 2021-02-27: 12.5 mg via ORAL
  Filled 2021-02-27: qty 1

## 2021-02-27 MED ORDER — DILTIAZEM HCL-DEXTROSE 125-5 MG/125ML-% IV SOLN (PREMIX)
5.0000 mg/h | INTRAVENOUS | Status: DC
  Administered 2021-02-27: 5 mg/h via INTRAVENOUS
  Filled 2021-02-27 (×3): qty 125

## 2021-02-27 MED ORDER — SODIUM CHLORIDE 0.9 % IV BOLUS
1000.0000 mL | Freq: Once | INTRAVENOUS | Status: AC
  Administered 2021-02-27: 1000 mL via INTRAVENOUS

## 2021-02-27 NOTE — Progress Notes (Signed)
ANTICOAGULATION CONSULT NOTE  Pharmacy Consult for heparin Indication: atrial fibrillation  Allergies  Allergen Reactions   Aspirin     Other reaction(s): Bleeding   Penicillins Hives and Rash   Lisinopril Cough    Patient Measurements: Height: 5\' 7"  (170.2 cm) Weight: 90.2 kg (198 lb 14.4 oz) IBW/kg (Calculated) : 61.6  Vital Signs: Temp: 98.4 F (36.9 C) (10/19 1600) Temp Source: Oral (10/19 1600) BP: 115/69 (10/19 1800) Pulse Rate: 75 (10/19 1800)  Labs: Recent Labs    02/25/21 1441 02/26/21 0525 02/27/21 1738  HGB 13.6 12.4  --   HCT 43.6 38.6  --   PLT 225 222  --   APTT 23*  --   --   LABPROT 13.0  --   --   INR 1.0  --   --   HEPARINUNFRC  --   --  0.17*  CREATININE 0.89 0.75  --      Estimated Creatinine Clearance: 70 mL/min (by C-G formula based on SCr of 0.75 mg/dL).   Medical History: History reviewed. No pertinent past medical history.  Medications:  Medications Prior to Admission  Medication Sig Dispense Refill Last Dose   albuterol (PROVENTIL) (2.5 MG/3ML) 0.083% nebulizer solution Take 2.5 mg by nebulization every 4 (four) hours as needed for wheezing.   unknown   atorvastatin (LIPITOR) 10 MG tablet Take 10 mg by mouth daily.   unknown   carboxymethylcellulose (REFRESH PLUS) 0.5 % SOLN Place 1 drop into both eyes 4 (four) times daily as needed (dry eyes).   unknown   DULERA 100-5 MCG/ACT AERO Inhale 2 puffs into the lungs 2 (two) times daily.   unknown   losartan (COZAAR) 25 MG tablet Take 25 mg by mouth daily.   unknown   Multiple Vitamin (MULTIVITAMIN) tablet Take 1 tablet by mouth daily.   unknown   omeprazole (PRILOSEC) 20 MG capsule Take 20 mg by mouth daily.   unknown   tiotropium (SPIRIVA HANDIHALER) 18 MCG inhalation capsule Place 18 mcg into inhaler and inhale daily.   unknown    Assessment: 68 YOF who presents with L MCA stroke s/p tenecteplase developed new onset Afib RVR. Pharmacy consulted to start IV heparin per stroke  protocol.   Initial heparin level below goal at 0.17.  Goal of Therapy:  Heparin level 0.3-0.5 units/ml Monitor platelets by anticoagulation protocol: Yes   Plan:  -Increase heparin to 1500 units/h -Recheck heparin level in 8h  61, PharmD, [place], [facility] Clinical Pharmacist [phone] Please check AMION for all [facility] Pharmacy numbers 02/27/2021

## 2021-02-27 NOTE — Consult Note (Signed)
NAME:  Michelle Dean, MRN:  [id number], DOB:  03-Apr-1946, LOS: 2 ADMISSION DATE:  02/25/2021, CONSULTATION DATE:  02/27/21 REFERRING MD:  Thomasena Edis CHIEF COMPLAINT:  Aphasia   History of Present Illness:  Michelle Dean is a 75 y.o. female who has a PMH as outlined below.  She presented to any [facility] ED 10/17 with acute onset of confusion and expressive and receptive aphasia.  CT head was negative for hemorrhage.  She received TNKase.  CTA revealed left M2/M3 occlusion for which she was transferred to [facility] for angiogram and possible thrombectomy.  She was taken to IR and had partial recanalization with reocclusion followed by mechanical thrombolysis achieving a TICI 2C revascularization.  10/19, she had A. fib with RVR.  She was started on Cardizem however RVR persisted.  PCCM subsequently asked to see in consultation.  Pertinent  Medical History:  has Acute ischemic stroke (HCC); Stroke (cerebrum) (HCC); Middle cerebral artery embolism, left; Aphasia; and Atrial fibrillation (HCC) on their problem list.  Significant Hospital Events: Including procedures, antibiotic start and stop dates in addition to other pertinent events   10/17 > admit. 10/19 > PCCM consult for A.fib RVR.  Interim History / Subjective:  Awake, HR ranging from 105 to 140. Remains aphasic  Objective:  Blood pressure 104/81, pulse (!) 141, temperature 98.2 F (36.8 C), temperature source Oral, resp. rate (!) 24, height 5\' 7"  (1.702 m), weight 90.2 kg, SpO2 100 %.        Intake/Output Summary (Last 24 hours) at 02/27/2021 0857 Last data filed at 02/27/2021 0800 Gross per 24 hour  Intake 2588.37 ml  Output 1000 ml  Net 1588.37 ml    Filed Weights   02/25/21 1452 02/25/21 1514  Weight: 99.8 kg 90.2 kg    Examination: General: Adult female, resting in bed, in NAD. Neuro: Awake, has expressive aphasia. MAE's. HEENT: [place]/AT. Sclerae anicteric. EOMI. Cardiovascular: IRIR, tachy, no M/R/G.  Lungs:  Respirations even and unlabored.  CTA bilaterally, No W/R/R. Abdomen: BS x 4, soft, NT/ND.  Musculoskeletal: No gross deformities, no edema.  Skin: Intact, warm, no rashes.  Telemetry personally reviewed and shows new onset atrial fibrillation yesterday evening.  Appears to have slowed down around 3 AM with visible flutter waves.  Has been in rapid atrial fibrillation or flutter since early this morning.  Labs/imaging personally reviewed:  CTA head 10/17 > Left M2/M3 occlusion. CT 10/18 > left MCA contrast staining, hypodensity in left temporal lobe. MRI brain 10/18 > mod posterior left MCA infarct, mild to mod chronic small vessel ischemic disease. Echo 10/18 > EF 55-60%, G2DD, mildly enlarged RV/LA/RA. LE duplex 10/18 > neg.  Assessment & Plan:   Left M2/M3 occlusion - s/p TNKase and partial recanalization with reocclusion followed by mechanical thrombolysis achieving a TICI 2C revascularization.. - Stroke team managing. - Continue ASA.  A.fib with RVR - new onset. - Start amio bolus and infusion. -Spoke with neurology: Can anticoagulate now if necessary -Consult cardiology: Patient continues to have rapid atrial fibrillation despite amiodarone infusion.   - Check TSH.  Hx HTN, HLD. - Continue Lipitor, Labetalol PRN, ASA.  Hx COPD per report with intermittent O2 use. - Continue supplemental O2 as needed to maintain SpO2 > 92%. - Continue BD's.  Best practice (evaluated daily):  Per primary.  Labs   CBC: Recent Labs  Lab 02/25/21 1441 02/26/21 0525  WBC 9.9 12.2*  NEUTROABS 6.2 10.2*  HGB 13.6 12.4  HCT 43.6 38.6  MCV 95.8 93.9  PLT 225 222     Basic Metabolic Panel: Recent Labs  Lab 02/25/21 1441 02/26/21 0525  NA 141 142  K 3.4* 3.6  CL 107 109  CO2 26 25  GLUCOSE 113* 142*  BUN 21 10  CREATININE 0.89 0.75  CALCIUM 8.8* 8.4*    GFR: Estimated Creatinine Clearance: 70 mL/min (by C-G formula based on SCr of 0.75 mg/dL). Recent Labs  Lab  02/25/21 1441 02/26/21 0525  WBC 9.9 12.2*     Liver Function Tests: Recent Labs  Lab 02/25/21 1441  AST 20  ALT 14  ALKPHOS 56  BILITOT 0.6  PROT 6.8  ALBUMIN 3.9    No results for input(s): LIPASE, AMYLASE in the last 168 hours. No results for input(s): AMMONIA in the last 168 hours.  ABG No results found for: PHART, PCO2ART, PO2ART, HCO3, TCO2, ACIDBASEDEF, O2SAT   Coagulation Profile: Recent Labs  Lab 02/25/21 1441  INR 1.0     Cardiac Enzymes: No results for input(s): CKTOTAL, CKMB, CKMBINDEX, TROPONINI in the last 168 hours.  HbA1C: Hgb A1c MFr Bld  Date/Time Value Ref Range Status  02/26/2021 05:25 AM 5.5 4.8 - 5.6 % Final    Comment:    (NOTE) Pre diabetes:          5.7%-6.4%  Diabetes:              >6.4%  Glycemic control for   <7.0% adults with diabetes     CBG: Recent Labs  Lab 02/25/21 1420  GLUCAP 99    CRITICAL CARE Performed by: Lynnell Catalan   Total critical care time: 35 minutes  Critical care time was exclusive of separately billable procedures and treating other patients.  Critical care was necessary to treat or prevent imminent or life-threatening deterioration.  Critical care was time spent personally by me on the following activities: development of treatment plan with patient and/or surrogate as well as nursing, discussions with consultants, evaluation of patient's response to treatment, examination of patient, obtaining history from patient or surrogate, ordering and performing treatments and interventions, ordering and review of laboratory studies, ordering and review of radiographic studies, pulse oximetry, re-evaluation of patient's condition and participation in multidisciplinary rounds.  Lynnell Catalan, MD [facility] ICU Physician [facility] [place] Critical Care  Pager: [phone] Mobile: [phone] After hours: [phone].

## 2021-02-27 NOTE — Progress Notes (Signed)
Patient being treated for AF RVR with PO and IV Cardizem gtt and IV Amio gtt. Patient with some SBP's <120 Current goal SBP 120-160 per Neuro.  Dr. Roda Shutters aware and ok as long as there's no concurrent neuro change with SBP <120 Will continue frequent neuro exams, no changes observed.

## 2021-02-27 NOTE — Consult Note (Addendum)
NAME:  Michelle Dean, MRN:  [id number], DOB:  28-Aug-1945, LOS: 2 ADMISSION DATE:  02/25/2021, CONSULTATION DATE:  02/27/21 REFERRING MD:  Thomasena Edis CHIEF COMPLAINT:  Aphasia   History of Present Illness:  Michelle Dean is a 75 y.o. female who has a PMH as outlined below.  She presented to any [facility] ED 10/17 with acute onset of confusion and expressive and receptive aphasia.  CT head was negative for hemorrhage.  She received TNKase.  CTA revealed left M2/M3 occlusion for which she was transferred to [facility] for angiogram and possible thrombectomy.  She was taken to IR and had partial recanalization with reocclusion followed by mechanical thrombolysis achieving a TICI 2C revascularization.  10/19, she had A. fib with RVR.  She was started on Cardizem however RVR persisted.  PCCM subsequently asked to see in consultation.  Pertinent  Medical History:  has Acute ischemic left MCA stroke (HCC); Stroke (cerebrum) (HCC); and Middle cerebral artery embolism, left on their problem list.  Significant Hospital Events: Including procedures, antibiotic start and stop dates in addition to other pertinent events   10/17 > admit. 10/19 > PCCM consult for A.fib RVR.  Interim History / Subjective:  Awake, HR ranging from 105 to 140.  Objective:  Blood pressure (!) 119/57, pulse 91, temperature 98 F (36.7 C), temperature source Oral, resp. rate 16, height 5\' 7"  (1.702 m), weight 90.2 kg, SpO2 100 %.        Intake/Output Summary (Last 24 hours) at 02/27/2021 0359 Last data filed at 02/27/2021 0200 Gross per 24 hour  Intake 2870.56 ml  Output 1150 ml  Net 1720.56 ml   Filed Weights   02/25/21 1452 02/25/21 1514  Weight: 99.8 kg 90.2 kg    Examination: General: Adult female, resting in bed, in NAD. Neuro: Awake, has expressive aphasia. MAE's. HEENT: [place]/AT. Sclerae anicteric. EOMI. Cardiovascular: IRIR, tachy, no M/R/G.  Lungs: Respirations even and unlabored.  CTA bilaterally, No  W/R/R. Abdomen: BS x 4, soft, NT/ND.  Musculoskeletal: No gross deformities, no edema.  Skin: Intact, warm, no rashes.  Labs/imaging personally reviewed:  CTA head 10/17 > Left M2/M3 occlusion. CT 10/18 > left MCA contrast staining, hypodensity in left temporal lobe. MRI brain 10/18 > mod posterior left MCA infarct, mild to mod chronic small vessel ischemic disease. Echo 10/18 > EF 55-60%, G2DD, mildly enlarged RV/LA/RA. LE duplex 10/18 > neg.  Assessment & Plan:   Left M2/M3 occlusion - s/p TNKase and partial recanalization with reocclusion followed by mechanical thrombolysis achieving a TICI 2C revascularization.. - Stroke team managing. - Continue ASA.  A.fib with RVR - new onset. - Start amio bolus and infusion. - Wean off Cardizem as able. - Continue Lopressor. - Defer AC for now, can consider if A.fib persists and once cleared with neuro.  Hx HTN, HLD. - Continue Lipitor, Labetalol PRN, ASA.  Hx COPD per report with intermittent O2 use. - Continue supplemental O2 as needed to maintain SpO2 > 92%. - Continue BD's.  Dysphagia. - SLP eval.  Best practice (evaluated daily):  Per primary.  Labs   CBC: Recent Labs  Lab 02/25/21 1441 02/26/21 0525  WBC 9.9 12.2*  NEUTROABS 6.2 10.2*  HGB 13.6 12.4  HCT 43.6 38.6  MCV 95.8 93.9  PLT 225 222    Basic Metabolic Panel: Recent Labs  Lab 02/25/21 1441 02/26/21 0525  NA 141 142  K 3.4* 3.6  CL 107 109  CO2 26 25  GLUCOSE 113* 142*  BUN 21  10  CREATININE 0.89 0.75  CALCIUM 8.8* 8.4*   GFR: Estimated Creatinine Clearance: 70 mL/min (by C-G formula based on SCr of 0.75 mg/dL). Recent Labs  Lab 02/25/21 1441 02/26/21 0525  WBC 9.9 12.2*    Liver Function Tests: Recent Labs  Lab 02/25/21 1441  AST 20  ALT 14  ALKPHOS 56  BILITOT 0.6  PROT 6.8  ALBUMIN 3.9   No results for input(s): LIPASE, AMYLASE in the last 168 hours. No results for input(s): AMMONIA in the last 168 hours.  ABG No results  found for: PHART, PCO2ART, PO2ART, HCO3, TCO2, ACIDBASEDEF, O2SAT   Coagulation Profile: Recent Labs  Lab 02/25/21 1441  INR 1.0    Cardiac Enzymes: No results for input(s): CKTOTAL, CKMB, CKMBINDEX, TROPONINI in the last 168 hours.  HbA1C: Hgb A1c MFr Bld  Date/Time Value Ref Range Status  02/26/2021 05:25 AM 5.5 4.8 - 5.6 % Final    Comment:    (NOTE) Pre diabetes:          5.7%-6.4%  Diabetes:              >6.4%  Glycemic control for   <7.0% adults with diabetes     CBG: Recent Labs  Lab 02/25/21 1420  GLUCAP 99    Review of Systems:   Unable to obtain due to aphasia.  Past Medical History:  She,  has no past medical history on file.   Surgical History:   Past Surgical History:  Procedure Laterality Date   RADIOLOGY WITH ANESTHESIA N/A 02/25/2021   Procedure: IR WITH ANESTHESIA;  Surgeon: Radiologist, Medication, MD;  Location: [facility] OR;  Service: Radiology;  Laterality: N/A;     Social History:      Family History:  Her family history is not on file.   Allergies Allergies  Allergen Reactions   Aspirin     Other reaction(s): Bleeding   Penicillins Hives and Rash   Lisinopril Cough     Home Medications  Prior to Admission medications   Medication Sig Start Date End Date Taking? Authorizing Provider  albuterol (PROVENTIL) (2.5 MG/3ML) 0.083% nebulizer solution Take 2.5 mg by nebulization every 4 (four) hours as needed for wheezing. 11/21/19  Yes [provider]  atorvastatin (LIPITOR) 10 MG tablet Take 10 mg by mouth daily. 12/24/20  Yes [provider]  carboxymethylcellulose (REFRESH PLUS) 0.5 % SOLN Place 1 drop into both eyes 4 (four) times daily as needed (dry eyes).   Yes [provider]  DULERA 100-5 MCG/ACT AERO Inhale 2 puffs into the lungs 2 (two) times daily. 02/14/21  Yes [provider]  losartan (COZAAR) 25 MG tablet Take 25 mg by mouth daily. 02/21/21  Yes [provider]  Multiple Vitamin  (MULTIVITAMIN) tablet Take 1 tablet by mouth daily.   Yes [provider]  omeprazole (PRILOSEC) 20 MG capsule Take 20 mg by mouth daily. 02/04/21  Yes [provider]  tiotropium (SPIRIVA HANDIHALER) 18 MCG inhalation capsule Place 18 mcg into inhaler and inhale daily. 01/29/21  Yes [provider]     Critical care time: 35 min.   Rutherford Guys, PA - C [facility] For pager details, please see AMION or use Epic chat  After 1900, please call [facility] for cross coverage needs 02/27/2021, 3:59 AM

## 2021-02-27 NOTE — Progress Notes (Signed)
Inpatient Rehabilitation Admissions Coordinator   I met at bedside with patient and her son. I will begin insurance Auth for  a possible CIR admit pending medical stability and insurance approval. Discussed with Dr Erlinda Hong.  Danne Baxter, RN, MSN Rehab Admissions Coordinator [phone] 02/27/2021 11:37 AM

## 2021-02-27 NOTE — Progress Notes (Signed)
Physical Therapy Treatment Patient Details Name: Michelle Dean MRN: [id number] DOB: 12-19-1945 Today's Date: 02/27/2021   History of Present Illness 75 yo female presents to [facility] on 10/17 with aphasia. Imaging reveals Left MCA distal M2 stroke, currently embolic of undetermined etiology, transferred to [facility] for intervention. s/p L common carotid arteriogram via R CFA, partial recanalization with reocclusion with x 1 pass with 40 mm solitaireX retriever and contact aspiration,x1 pass with contact aspiration and x1 pass with mechanical thrombolysis  acieving a TICI 2C revascularization on 10/17. PMH includes COPD (intermittently uses oxygen at home), hypertension, hyperlipidemia.    PT Comments    Pt with afib overnight and today, RN approved of mobility with PT. Pt ambulatory in hallway with min assist for intermittent steadying, requires MAX demonstrative cues and gestures to participate successfully in mobility. Pt with Hrmax 142 bpm during session, RN aware and present at bedside at end of session. PT to contineu to follow.     Recommendations for follow up therapy are one component of a multi-disciplinary discharge planning process, led by the attending physician.  Recommendations may be updated based on patient status, additional functional criteria and insurance authorization.  Follow Up Recommendations  CIR     Equipment Recommendations  None recommended by PT    Recommendations for Other Services Rehab consult     Precautions / Restrictions Precautions Precautions: Fall Precaution Comments: receptive aphasia - responding most consistently with demonstrative and tactile cuing     Mobility  Bed Mobility Overal bed mobility: Needs Assistance Bed Mobility: Supine to Sit     Supine to sit: Min assist;HOB elevated     General bed mobility comments: light truncal assist    Transfers Overall transfer level: Needs assistance Equipment used: 1 person hand held  assist Transfers: Sit to/from Stand Sit to Stand: Min assist         General transfer comment: light rise and steady assist, STS x3 from EOB, toilet, and chair in hallway.  Ambulation/Gait Ambulation/Gait assistance: Min assist Gait Distance (Feet): 80 Feet (seated rest break + 80) Assistive device: None Gait Pattern/deviations: Step-through pattern;Decreased stride length;Drifts right/left Gait velocity: decr   General Gait Details: steadying assist intermittently throughout, seated rest break mid-gait to recover fatigue and afib up to 142 bpm/   Stairs             Wheelchair Mobility    Modified Rankin (Stroke Patients Only) Modified Rankin (Stroke Patients Only) Pre-Morbid Rankin Score: No symptoms Modified Rankin: Moderately severe disability     Balance Overall balance assessment: Needs assistance Sitting-balance support: No upper extremity supported;Feet supported Sitting balance-Leahy Scale: Fair Sitting balance - Comments: able to sit EOB without assist   Standing balance support: No upper extremity supported;During functional activity Standing balance-Leahy Scale: Fair Standing balance comment: accepts light challenge ( multidirectional perturbations through gait belt)                            Cognition Arousal/Alertness: Awake/alert Behavior During Therapy: WFL for tasks assessed/performed Overall Cognitive Status: Difficult to assess                                 General Comments: significant receptive aphasia, can perform automatic tasks (toileting, wiping) at times, but other times cannot (eating pizza with a spoon). Pt does express needs and/or understanding at times, for example "I have to  pee", "I'll sit down", "which way do I go". R inattention present      Exercises      General Comments General comments (skin integrity, edema, etc.): afib with HR 60-142 bpm, RN aware      Pertinent Vitals/Pain Pain  Assessment: Faces Faces Pain Scale: No hurt Pain Intervention(s): Monitored during session;Limited activity within patient's tolerance    Home Living                      Prior Function            PT Goals (current goals can now be found in the care plan section) Acute Rehab PT Goals Patient Stated Goal: return home PT Goal Formulation: With patient/family Time For Goal Achievement: 03/12/21 Potential to Achieve Goals: Good Progress towards PT goals: Progressing toward goals    Frequency    Min 4X/week      PT Plan Current plan remains appropriate    Co-evaluation              AM-PAC PT "6 Clicks" Mobility   Outcome Measure  Help needed turning from your back to your side while in a flat bed without using bedrails?: A Little Help needed moving from lying on your back to sitting on the side of a flat bed without using bedrails?: A Little Help needed moving to and from a bed to a chair (including a wheelchair)?: A Little Help needed standing up from a chair using your arms (e.g., wheelchair or bedside chair)?: A Little Help needed to walk in hospital room?: A Little Help needed climbing 3-5 steps with a railing? : A Lot 6 Click Score: 17    End of Session Equipment Utilized During Treatment: Oxygen;Gait belt (O2 left off during gait, placed back on 3LO2 via [place]) Activity Tolerance: Patient tolerated treatment well Patient left: in chair;with call bell/phone within reach;with chair alarm set;with nursing/sitter in room Nurse Communication: Mobility status PT Visit Diagnosis: Muscle weakness (generalized) (M62.81)     Time: 8101-7510 PT Time Calculation (min) (ACUTE ONLY): 26 min  Charges:  $Gait Training: 8-22 mins $Therapeutic Activity: 8-22 mins                    Marye Round, PT DPT Acute Rehabilitation Services Pager [phone]  Office [phone]    Tyrone Apple E Christain [place] 02/27/2021, 5:52 PM

## 2021-02-27 NOTE — Consult Note (Signed)
CARDIOLOGY CONSULT NOTE  Patient ID: Michelle Dean MRN: [id number] DOB/AGE: 12/18/1945 75 y.o.  Admit date: 02/25/2021 Attending physician: Stroke, Md, MD Primary Physician:  System, Provider Not In Outpatient Cardiologist: NA Inpatient Cardiologist: Tessa Lerner, DO, [facility]  Reason of consultation: Atrial Fibrillation.  Referring physician: Dr. Lynnell Catalan   Chief complaint: Confusion, aphasia  HPI:  Michelle Dean is a 75 y.o. Caucasian female who presents with a chief complaint of " confusion, aphasia." Her past medical history and cardiovascular risk factors include: Recent stroke (M2/M3 occlusion), HTN, HLD, COPD on intermittent oxygen support, postmenopausal female, advanced age.  Initially presented to the ED on 02/25/2021 with acute onset of confusion/expressive and receptive aphasia.  CT of the head was performed was negative for hemorrhage and received TNKase.  CTA noted left M2/M3 occlusion and was transferred to [facility] for angiogram and possible thrombectomy.  Patient is status post partial recannulation with reocclusion followed by mechanical thrombolysis achieving TICI 2C revascularization.  Postprocedure patient was found to be in A. fib with RVR and started on Cardizem drip.  Cardiology is consulted today for management of atrial fibrillation.  Due to underlying aphasia patient is not the best historian.  I called the patient's son Dickie La at [phone].  Patient's son states that she follows up with [facility] for her management of chronic comorbid conditions.  He is not aware of which PCP or specialist that mom sees on a regular basis.  He does not recall the patient complaining of any cardiovascular symptoms prior to her recent stroke.  ALLERGIES: Allergies  Allergen Reactions   Aspirin     Other reaction(s): Bleeding   Penicillins Hives and Rash   Lisinopril Cough    PAST MEDICAL HISTORY: HTN HLD COPD - uses oxygen as needed.  PAST SURGICAL  HISTORY: Past Surgical History:  Procedure Laterality Date   RADIOLOGY WITH ANESTHESIA N/A 02/25/2021   Procedure: IR WITH ANESTHESIA;  Surgeon: Radiologist, Medication, MD;  Location: [facility] OR;  Service: Radiology;  Laterality: N/A;    FAMILY HISTORY: Mom had MI in her 66s and maternal grandmother had a stroke.    SOCIAL HISTORY:  Former smoker, quit 20 years ago.  ETOH: socially  Illicit drugs: none.   MEDICATIONS: Current Outpatient Medications  Medication Instructions   albuterol (PROVENTIL) 2.5 mg, Nebulization, Every 4 hours PRN   atorvastatin (LIPITOR) 10 mg, Oral, Daily   carboxymethylcellulose (REFRESH PLUS) 0.5 % SOLN 1 drop, Both Eyes, 4 times daily PRN   DULERA 100-5 MCG/ACT AERO 2 puffs, Inhalation, 2 times daily   losartan (COZAAR) 25 mg, Oral, Daily   Multiple Vitamin (MULTIVITAMIN) tablet 1 tablet, Oral, Daily   omeprazole (PRILOSEC) 20 mg, Oral, Daily   Spiriva HandiHaler 18 mcg, Inhalation, Daily    REVIEW OF SYSTEMS: Review of Systems  Reason unable to perform ROS: aphasia.   PHYSICAL EXAM: Vitals with BMI 02/27/2021 02/27/2021 02/27/2021  Height - - -  Weight - - -  BMI - - -  Systolic 137 127 474  Diastolic 85 99 81  Pulse 137 137 141     Intake/Output Summary (Last 24 hours) at 02/27/2021 1113 Last data filed at 02/27/2021 0900 Gross per 24 hour  Intake 2386.03 ml  Output 700 ml  Net 1686.03 ml    Net IO Since Admission: 2,540.17 mL [02/27/21 1113]  CONSTITUTIONAL: Resting in bed comfortably, no acute distress, appears older than stated age SKIN: Skin is warm and dry. No rash noted. No cyanosis. No pallor. No  jaundice HEAD: Normocephalic and atraumatic.  Nasal cannula oxygen EYES: No scleral icterus MOUTH/THROAT: Moist oral membranes.  NECK: No JVD present. No thyromegaly noted. No carotid bruits  LYMPHATIC: No visible cervical adenopathy.  CHEST Normal respiratory effort. No intercostal retractions  LUNGS: Clear to auscultation  bilaterally, no stridor. No wheezes. No rales.  CARDIOVASCULAR: Irregularly irregular, tachycardia, no murmurs rubs or gallops appreciated secondary to tachycardia. ABDOMINAL: Soft, nontender, nondistended, positive bowel sounds in all 4 quadrants, no apparent ascites.  EXTREMITIES: No peripheral edema  HEMATOLOGIC: No significant bruising NEUROLOGIC: Moving all 4 extremities, strength at least 3+, both receptive and expressive aphasia.   PSYCHIATRIC: Normal mood and affect. Normal behavior. Cooperative  RADIOLOGY: CT HEAD WO CONTRAST ( )  Result Date: 02/26/2021 CLINICAL DATA:  Stroke follow-up EXAM: CT HEAD WITHOUT CONTRAST TECHNIQUE: Contiguous axial images were obtained from the base of the skull through the vertex without intravenous contrast. COMPARISON:  None. FINDINGS: Brain: High-density material in the left temporal and posterior frontal lobe, status post interval interventional thrombectomy. Area of hypodensity in the left temporal lobe, most likely edema. No new cortical infarct. No hydrocephalus, extra-axial collection, mass, mass effect, or midline shift. Vascular: No hyperdense vessel or unexpected calcification. Skull: Normal. Negative for fracture or focal lesion. Sinuses/Orbits: No acute finding. Other: None. IMPRESSION: 1. Hyperdense material in the left temporal and frontal lobes, in an area that has recently been treated with interventional thrombectomy, favored to represent contrast staining and not hemorrhage. Recommend follow-up exam in 6 hours for further evaluation. 2. Hypodensity in the left temporal lobe, possibly edema from recent infarct. These results were called by telephone at the time of interpretation on 02/26/2021 at 3:02 Am to provider [facility] , who verbally acknowledged these results. Electronically Signed   By: Wiliam Ke M.D.   On: 02/26/2021 03:03   MR BRAIN WO CONTRAST  Result Date: 02/26/2021 CLINICAL DATA:  Stroke follow-up. Aphasia. Status  post TNK and revascularization of an occluded left MCA branch vessel. EXAM: MRI HEAD WITHOUT CONTRAST TECHNIQUE: Multiplanar, multiecho pulse sequences of the brain and surrounding structures were obtained without intravenous contrast. COMPARISON:  Head CT 02/26/2021 FINDINGS: Brain: There is a moderate-sized acute infarct involving the left temporal lobe, inferior left parietal lobe, lateral left occipital lobe, and left insula (posterior MCA territory and MCA/PCA border zone). There is associated cytotoxic edema with sulcal effacement. Associated susceptibility artifact along the left sylvian fissure and left temporoparietal sulci corresponds to hyperdensity on CT and may reflect a combination of subarachnoid hemorrhage and cortical petechial hemorrhage. T2 hyperintensities elsewhere in the cerebral white matter bilaterally are nonspecific but compatible with mild-to-moderate chronic small vessel ischemic disease. There is mild cerebral atrophy. No midline shift or extra-axial fluid collection is evident. Vascular: Major intracranial vascular flow voids are preserved. Skull and upper cervical spine: Unremarkable bone marrow signal. Sinuses/Orbits: Bilateral cataract extraction. Trace left mastoid effusion. Clear paranasal sinuses. Other: None. IMPRESSION: 1. Moderate-sized acute posterior left MCA infarct. 2. Mild-to-moderate chronic small vessel ischemic disease. Electronically Signed   By: Sebastian Ache M.D.   On: 02/26/2021 16:21   ECHOCARDIOGRAM COMPLETE  Result Date: 02/26/2021    ECHOCARDIOGRAM REPORT   Patient Name:   JAMIE HAFFORD Date of Exam: 02/26/2021 Medical Rec #:  [id number]     Height:       67.0 in Accession #:    [id number]    Weight:       198.9 lb Date of Birth:  11/08/1945    BSA:  2.018 m Patient Age:    74 years      BP:           161/66 mmHg Patient Gender: F             HR:           86 bpm. Exam Location:  Inpatient Procedure: 2D Echo, Color Doppler and Cardiac Doppler  Indications:    Stroke i63.9  History:        Patient has no prior history of Echocardiogram examinations.  Sonographer:    Irving Burton Senior RDCS Referring Phys: [phone] ERIC LINDZEN IMPRESSIONS  1. Left ventricular ejection fraction, by estimation, is 55 to 60%. The left ventricle has normal function. The left ventricle has no regional wall motion abnormalities. Left ventricular diastolic parameters are consistent with Grade II diastolic dysfunction (pseudonormalization).  2. Right ventricular systolic function is normal. The right ventricular size is mildly enlarged. Tricuspid regurgitation signal is inadequate for assessing PA pressure.  3. Left atrial size was mildly dilated.  4. Right atrial size was mildly dilated.  5. The mitral valve is normal in structure. No evidence of mitral valve regurgitation. No evidence of mitral stenosis.  6. The aortic valve is tricuspid. Aortic valve regurgitation is not visualized. Mild aortic valve sclerosis is present, with no evidence of aortic valve stenosis.  7. The inferior vena cava is dilated in size with >50% respiratory variability, suggesting right atrial pressure of 8 mmHg. FINDINGS  Left Ventricle: Left ventricular ejection fraction, by estimation, is 55 to 60%. The left ventricle has normal function. The left ventricle has no regional wall motion abnormalities. The left ventricular internal cavity size was normal in size. There is  no left ventricular hypertrophy. Left ventricular diastolic parameters are consistent with Grade II diastolic dysfunction (pseudonormalization). Right Ventricle: The right ventricular size is mildly enlarged. No increase in right ventricular wall thickness. Right ventricular systolic function is normal. Tricuspid regurgitation signal is inadequate for assessing PA pressure. Left Atrium: Left atrial size was mildly dilated. Right Atrium: Right atrial size was mildly dilated. Pericardium: There is no evidence of pericardial effusion. Mitral Valve:  The mitral valve is normal in structure. No evidence of mitral valve regurgitation. No evidence of mitral valve stenosis. Tricuspid Valve: The tricuspid valve is normal in structure. Tricuspid valve regurgitation is not demonstrated. Aortic Valve: The aortic valve is tricuspid. Aortic valve regurgitation is not visualized. Mild aortic valve sclerosis is present, with no evidence of aortic valve stenosis. Pulmonic Valve: The pulmonic valve was normal in structure. Pulmonic valve regurgitation is not visualized. Aorta: The aortic root is normal in size and structure. Venous: The inferior vena cava is dilated in size with greater than 50% respiratory variability, suggesting right atrial pressure of 8 mmHg. IAS/Shunts: No atrial level shunt detected by color flow Doppler.  LEFT VENTRICLE PLAX 2D LVIDd:         3.80 cm   Diastology LVIDs:         2.40 cm   LV e' medial:    6.96 cm/s LV PW:         0.80 cm   LV E/e' medial:  11.3 LV IVS:        0.90 cm   LV e' lateral:   9.25 cm/s LVOT diam:     1.90 cm   LV E/e' lateral: 8.5 LV SV:         51 LV SV Index:   25 LVOT Area:  2.84 cm  RIGHT VENTRICLE RV S prime:     15.40 cm/s TAPSE (M-mode): 2.8 cm LEFT ATRIUM             Index        RIGHT ATRIUM           Index LA diam:        1.90 cm 0.94 cm/m   RA Area:     20.60 cm LA Vol (A2C):   45.9 ml 22.75 ml/m  RA Volume:   62.20 ml  30.83 ml/m LA Vol (A4C):   60.5 ml 29.99 ml/m LA Biplane Vol: 53.0 ml 26.27 ml/m  AORTIC VALVE LVOT Vmax:   109.00 cm/s LVOT Vmean:  72.733 cm/s LVOT VTI:    0.179 m  AORTA Ao Root diam: 3.30 cm Ao Asc diam:  3.30 cm MITRAL VALVE MV Area (PHT): 3.61 cm    SHUNTS MV Decel Time: 210 msec    Systemic VTI:  0.18 m MV E velocity: 78.50 cm/s  Systemic Diam: 1.90 cm MV A velocity: 74.40 cm/s MV E/A ratio:  1.06 Dalton McleanMD Electronically signed by Wilfred Lacy Signature Date/Time: 02/26/2021/1:42:18 PM    Final    CT HEAD CODE STROKE WO CONTRAST  Result Date: 02/25/2021 CLINICAL DATA:   Code stroke. EXAM: CT HEAD WITHOUT CONTRAST TECHNIQUE: Contiguous axial images were obtained from the base of the skull through the vertex without intravenous contrast. COMPARISON:  None. FINDINGS: Brain: No acute intracranial hemorrhage, mass effect, or edema. Gray-white differentiation is preserved. Age-indeterminate small vessel infarct of the left gangliocapsular region. Additional patchy low-density in the supratentorial white matter is nonspecific but may reflect mild chronic microvascular ischemic changes. Ventricles and sulci are within normal limits in size and configuration. No extra-axial collection. Vascular: No hyperdense vessel. Mild intracranial atherosclerotic calcification at the skull base. Skull: Unremarkable. Sinuses/Orbits: Aerated. Other: Mastoid air cells are clear. ASPECTS ([place] Stroke Program Early CT Score) - Ganglionic level infarction (caudate, lentiform nuclei, internal capsule, insula, M1-M3 cortex): 7 - Supraganglionic infarction (M4-M6 cortex): 3 Total score (0-10 with 10 being normal): 10 IMPRESSION: There is no acute intracranial hemorrhage or evidence of acute infarction. ASPECT score is 10. Age-indeterminate small vessel infarct of the left gangliocapsular region. These results were called by telephone at the time of interpretation on 02/25/2021 at 2:37 pm to provider Tanda Rockers , who verbally acknowledged these results. Electronically Signed   By: Guadlupe Spanish M.D.   On: 02/25/2021 14:39   VAS [place] LOWER EXTREMITY VENOUS (DVT)  Result Date: 02/26/2021  Lower Venous DVT Study Patient Name:  JANELIZ PRESTWOOD  Date of Exam:   02/26/2021 Medical Rec #: [id number]      Accession #:    [id number] Date of Birth: 1945-06-20     Patient Gender: F Patient Age:   28 years Exam Location:  [facility] Procedure:      VAS [place] LOWER EXTREMITY VENOUS (DVT) Referring Phys: Scheryl Marten XU --------------------------------------------------------------------------------  Indications:  Stroke.  Comparison Study: No prior study Performing Technologist: Gertie Fey MHA, RDMS, RVT, RDCS  Examination Guidelines: A complete evaluation includes B-mode imaging, spectral Doppler, color Doppler, and power Doppler as needed of all accessible portions of each vessel. Bilateral testing is considered an integral part of a complete examination. Limited examinations for reoccurring indications may be performed as noted. The reflux portion of the exam is performed with the patient in reverse Trendelenburg.  +---------+---------------+---------+-----------+----------+--------------+ RIGHT    CompressibilityPhasicitySpontaneityPropertiesThrombus Aging +---------+---------------+---------+-----------+----------+--------------+ CFV  Full           Yes      Yes                                 +---------+---------------+---------+-----------+----------+--------------+ SFJ      Full                                                        +---------+---------------+---------+-----------+----------+--------------+ FV Prox  Full                                                        +---------+---------------+---------+-----------+----------+--------------+ FV Mid   Full                                                        +---------+---------------+---------+-----------+----------+--------------+ FV DistalFull                                                        +---------+---------------+---------+-----------+----------+--------------+ PFV      Full                                                        +---------+---------------+---------+-----------+----------+--------------+ POP      Full           Yes      Yes                                 +---------+---------------+---------+-----------+----------+--------------+ PTV      Full                                                         +---------+---------------+---------+-----------+----------+--------------+ PERO     Full                                                        +---------+---------------+---------+-----------+----------+--------------+   +---------+---------------+---------+-----------+----------+--------------+ LEFT     CompressibilityPhasicitySpontaneityPropertiesThrombus Aging +---------+---------------+---------+-----------+----------+--------------+ CFV      Full           Yes      Yes                                 +---------+---------------+---------+-----------+----------+--------------+  SFJ      Full                                                        +---------+---------------+---------+-----------+----------+--------------+ FV Prox  Full                                                        +---------+---------------+---------+-----------+----------+--------------+ FV Mid   Full                                                        +---------+---------------+---------+-----------+----------+--------------+ FV DistalFull                                                        +---------+---------------+---------+-----------+----------+--------------+ PFV      Full                                                        +---------+---------------+---------+-----------+----------+--------------+ POP      Full           Yes      Yes                                 +---------+---------------+---------+-----------+----------+--------------+ PTV      Full                                                        +---------+---------------+---------+-----------+----------+--------------+ PERO     Full                                                        +---------+---------------+---------+-----------+----------+--------------+     Summary: RIGHT: - There is no evidence of deep vein thrombosis in the lower extremity.  - No cystic structure found in  the popliteal fossa.  LEFT: - There is no evidence of deep vein thrombosis in the lower extremity.  - No cystic structure found in the popliteal fossa.  *See table(s) above for measurements and observations. Electronically signed by Waverly Ferrari MD on 02/26/2021 at 4:59:49 PM.    Final    CT ANGIO HEAD NECK W WO CM W PERF (CODE STROKE)  Result Date: 02/25/2021 CLINICAL DATA:  Neuro deficit, acute, stroke suspected EXAM: CT ANGIOGRAPHY HEAD AND NECK CT PERFUSION BRAIN  TECHNIQUE: Multidetector CT imaging of the head and neck was performed using the standard protocol during bolus administration of intravenous contrast. Multiplanar CT image reconstructions and MIPs were obtained to evaluate the vascular anatomy. Carotid stenosis measurements (when applicable) are obtained utilizing NASCET criteria, using the distal internal carotid diameter as the denominator. Multiphase CT imaging of the brain was performed following IV bolus contrast injection. Subsequent parametric perfusion maps were calculated using RAPID software. CONTRAST:  OMNIPAQUE IOHEXOL 350 MG/ML SOLN COMPARISON:  None. FINDINGS: CTA NECK Aortic arch: Trace calcified plaque along the included arch. Great vessel origins are patent. Right carotid system: Patent.  No stenosis. Left carotid system: Patent. Trace mixed plaque at the bifurcation. No stenosis. Vertebral arteries: Patent and codominant.  No stenosis. Skeleton: Mild cervical spine degenerative changes. Degenerative changes at the temporomandibular joints. Other neck: Unremarkable. Upper chest: Centrilobular emphysema. Nodular but somewhat tubular opacities at the left apex probably reflect impacted airways. Review of the MIP images confirms the above findings CTA HEAD Anterior circulation: Intracranial internal carotid arteries are patent with mild calcified plaque. Left M1 MCA is patent. There is occlusion of a distal left M2 MCA branch within the posterior sylvian fissure. This  may reconstitute more distally. Anterior and right middle cerebral arteries are patent. Posterior circulation: Intracranial vertebral arteries are patent. Basilar artery is patent. Major cerebellar artery origins are patent. A left posterior communicating artery is present. Posterior cerebral arteries are patent. Venous sinuses: Patent as allowed by contrast bolus timing. Review of the MIP images confirms the above findings CT Brain Perfusion Findings: CBF (<30%) Volume: 3mL Perfusion (Tmax>6.0s) volume: 21mL Mismatch Volume: 18mL Infarction Location: Area of infarction and most territory at risk within the posterior left MCA territory. There is some artifactual calculated territory at risk within the inferior frontal lobes. 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 May reconstitute more distally. Perfusion imaging demonstrates 3 mL of core infarction and 18 mL of penumbra within the posterior left MCA territory. The penumbra volume is overestimated as there is some artifact in the inferior frontal lobes included in the number. No occlusion or hemodynamically significant stenosis in the neck. These results were provided by telephone at the time of interpretation on 02/25/2021 at 3:55 pm to provider [facility] , who verbally acknowledged these results. Electronically Signed   By: Guadlupe Spanish M.D.   On: 02/25/2021 16:01    LABORATORY DATA: Lab Results  Component Value Date   WBC 12.2 (H) 02/26/2021   HGB 12.4 02/26/2021   HCT 38.6 02/26/2021   MCV 93.9 02/26/2021   PLT 222 02/26/2021    Recent Labs  Lab 02/25/21 1441 02/26/21 0525  NA 141 142  K 3.4* 3.6  CL 107 109  CO2 26 25  BUN 21 10  CREATININE 0.89 0.75  CALCIUM 8.8* 8.4*  PROT 6.8  --   BILITOT 0.6  --   ALKPHOS 56  --   ALT 14  --   AST 20  --   GLUCOSE 113* 142*    Lipid Panel  Lab Results  Component Value Date   CHOL 131 02/26/2021   HDL 57 02/26/2021   LDLCALC 63 02/26/2021    TRIG 55 02/26/2021   CHOLHDL 2.3 02/26/2021    BNP (last 3 results) No results for input(s): BNP in the last 8760 hours.  HEMOGLOBIN A1C Lab Results  Component Value Date   HGBA1C 5.5 02/26/2021   MPG 111.15 02/26/2021    Cardiac  Panel (last 3 results) No results for input(s): CKTOTAL, CKMB, TROPONINIHS, RELINDX in the last 72 hours.   TSH No results for input(s): TSH in the last 8760 hours.   CARDIAC DATABASE: EKG: 02/27/2021: Atrial flutter 2:1, 130bpm, right bundle branch block.  No prior EKGs available for comparison  Echocardiogram: 02/26/2021: 1. Left ventricular ejection fraction, by estimation, is 55 to 60%. The  left ventricle has normal function. The left ventricle has no regional  wall motion abnormalities. Left ventricular diastolic parameters are  consistent with Grade II diastolic dysfunction (pseudonormalization).   2. Right ventricular systolic function is normal. The right ventricular  size is mildly enlarged. Tricuspid regurgitation signal is inadequate for  assessing PA pressure.   3. Left atrial size was mildly dilated.   4. Right atrial size was mildly dilated.   5. The mitral valve is normal in structure. No evidence of mitral valve regurgitation. No evidence of mitral stenosis.   6. The aortic valve is tricuspid. Aortic valve regurgitation is not visualized. Mild aortic valve sclerosis is present, with no evidence of aortic valve stenosis.   7. The inferior vena cava is dilated in size with >50% respiratory variability, suggesting right atrial pressure of 8 mmHg.    IMPRESSION & RECOMMENDATIONS: Ahmira Boisselle is a 75 y.o. Caucasian female whose past medical history and cardiovascular risk factors include: Recent stroke (M2/M3 occlusion), HTN, HLD, COPD on intermittent oxygen support, postmenopausal female, advanced age.  Primary Diagnosis:  Atrial flutter with rapid ventricular rate: Initially noted to have new onset of atrial fibrillation with RVR  and was started on amiodarone which has organized the rhythm to atrial flutter. Agree with CCM with regards to reinitiation of amiodarone for rhythm management. Patient has been started on IV heparin for thromboembolic prophylaxis. Discontinue Lopressor. Will retry Cardizem.  Start Cardizem 30 mg p.o. every 8 hours with holding parameters. Start IV Cardizem drip and titrate up for ventricular rate control.  We will hold off on Cardizem bolus given her current SBP of .  CHA2DS2-VASc SCORE is 6 which correlates to 9.8% risk of stroke per year (HTN, age, stroke, gender). Would focus on rate control strategy as the patient recently had a stroke and is at risk of hemorrhagic conversion.   However, if ventricular rate control is difficult with parenteral medications may consider TEE guided cardioversion later this admission after discussing with the patient's son Toby, neurology, and CCM. But still will subject her to [facility] [facility] - [place] for atleast 4 weeks post-procedure due to atrial stunning.  Recommend ischemic evaluation upon discharge given the newly discovered atrial fibrillation/flutter TSH pending Echocardiogram images personally reviewed.  Left M2/M3 occlusion: Status post TNKase and partial recannulization with reocclusion followed by mechanical thrombolysis achieving TICI 2C revascularization. Stroke team managing.  Hypertension: Currently being managed by CCM and neurology.  Hyperlipidemia: LDL is less than 70 mg/dL. Continue statin therapy. Most of his recent lipid profile reviewed.   Total encounter time 87 minutes. *Total Encounter Time as defined by the Centers for Medicare and Medicaid Services includes, in addition to the face-to-face time of a patient visit (documented in the note above) non-face-to-face time: obtaining and reviewing outside history, obtaining HPI from her son Dickie La over the phone, spoke to Dr.Agarwala, ordering and reviewing medications, tests or procedures, care  coordination (communications with other health care professionals or caregivers) and documentation in the medical record.  Patient's questions and concerns were addressed to her satisfaction. She voices understanding of the instructions provided during this encounter.  This note was created using a voice recognition software as a result there may be grammatical errors inadvertently enclosed that do not reflect the nature of this encounter. Every attempt is made to correct such errors.  Delilah Shan [facility]  Pager: [phone] Office: [phone] 02/27/2021, 11:13 AM

## 2021-02-27 NOTE — Progress Notes (Signed)
Patient noted with IV infiltrate to right AC site with Amio infusing. Area edematous and red. No signs of pain/tenderness upon assessment. PIV pulled. New IV placed in left distal arm/wrist with Amio now infusing. RUE elevated on pillows and ice pack placed. Pharmacist made aware of infiltration, ice and elevation sufficient at this time, will monitor for any worsening.  IV team to place a second IV via ultrasound shortly.

## 2021-02-27 NOTE — Progress Notes (Signed)
STROKE TEAM PROGRESS NOTE   SUBJECTIVE (INTERVAL HISTORY) Her RN is at the bedside. Pt neuro stable still has receptive aphasia > expressive aphasia, moving all extremities. However, developed afib RVR overnight, was given cardizem drip but not effective, now on amiodarone. Put on heparin IV.   OBJECTIVE Temp:  [97.9 F (36.6 C)-98.5 F (36.9 C)] 98.2 F (36.8 C) (10/19 0800) Pulse Rate:  [66-142] 137 (10/19 1000) Cardiac Rhythm: Atrial fibrillation (10/19 0800) Resp:  [13-30] 23 (10/19 1000) BP: (68-140)/(28-112) 137/85 (10/19 1000) SpO2:  [89 %-100 %] 97 % (10/19 1000) Arterial Line BP: (96-117)/(82-89) 96/89 (10/18 1300)  Recent Labs  Lab 02/25/21 1420  GLUCAP 99   Recent Labs  Lab 02/25/21 1441 02/26/21 0525  NA 141 142  K 3.4* 3.6  CL 107 109  CO2 26 25  GLUCOSE 113* 142*  BUN 21 10  CREATININE 0.89 0.75  CALCIUM 8.8* 8.4*   Recent Labs  Lab 02/25/21 1441  AST 20  ALT 14  ALKPHOS 56  BILITOT 0.6  PROT 6.8  ALBUMIN 3.9   Recent Labs  Lab 02/25/21 1441 02/26/21 0525  WBC 9.9 12.2*  NEUTROABS 6.2 10.2*  HGB 13.6 12.4  HCT 43.6 38.6  MCV 95.8 93.9  PLT 225 222   No results for input(s): CKTOTAL, CKMB, CKMBINDEX, TROPONINI in the last 168 hours. Recent Labs    02/25/21 1441  LABPROT 13.0  INR 1.0   Recent Labs    02/26/21 0256  COLORURINE YELLOW  LABSPEC >1.046*  PHURINE 6.0  GLUCOSEU NEGATIVE  HGBUR LARGE*  BILIRUBINUR NEGATIVE  KETONESUR 20*  PROTEINUR NEGATIVE  NITRITE NEGATIVE  LEUKOCYTESUR NEGATIVE       Component Value Date/Time   CHOL 131 02/26/2021 0525   TRIG 55 02/26/2021 0525   HDL 57 02/26/2021 0525   CHOLHDL 2.3 02/26/2021 0525   VLDL 11 02/26/2021 0525   LDLCALC 63 02/26/2021 0525   Lab Results  Component Value Date   HGBA1C 5.5 02/26/2021      Component Value Date/Time   LABOPIA NONE DETECTED 02/26/2021 0256   COCAINSCRNUR NONE DETECTED 02/26/2021 0256   LABBENZ NONE DETECTED 02/26/2021 0256   AMPHETMU NONE  DETECTED 02/26/2021 0256   THCU NONE DETECTED 02/26/2021 0256   LABBARB NONE DETECTED 02/26/2021 0256    Recent Labs  Lab 02/25/21 1441  ETH <10    I have personally reviewed the radiological images below and agree with the radiology interpretations.  CT HEAD WO CONTRAST ( )  Result Date: 02/26/2021 CLINICAL DATA:  Stroke follow-up EXAM: CT HEAD WITHOUT CONTRAST TECHNIQUE: Contiguous axial images were obtained from the base of the skull through the vertex without intravenous contrast. COMPARISON:  None. FINDINGS: Brain: High-density material in the left temporal and posterior frontal lobe, status post interval interventional thrombectomy. Area of hypodensity in the left temporal lobe, most likely edema. No new cortical infarct. No hydrocephalus, extra-axial collection, mass, mass effect, or midline shift. Vascular: No hyperdense vessel or unexpected calcification. Skull: Normal. Negative for fracture or focal lesion. Sinuses/Orbits: No acute finding. Other: None. IMPRESSION: 1. Hyperdense material in the left temporal and frontal lobes, in an area that has recently been treated with interventional thrombectomy, favored to represent contrast staining and not hemorrhage. Recommend follow-up exam in 6 hours for further evaluation. 2. Hypodensity in the left temporal lobe, possibly edema from recent infarct. These results were called by telephone at the time of interpretation on 02/26/2021 at 3:02 Am to provider Marisue Humble , who verbally  acknowledged these results. Electronically Signed   By: Wiliam Ke M.D.   On: 02/26/2021 03:03   MR BRAIN WO CONTRAST  Result Date: 02/26/2021 CLINICAL DATA:  Stroke follow-up. Aphasia. Status post TNK and revascularization of an occluded left MCA branch vessel. EXAM: MRI HEAD WITHOUT CONTRAST TECHNIQUE: Multiplanar, multiecho pulse sequences of the brain and surrounding structures were obtained without intravenous contrast. COMPARISON:  Head CT 02/26/2021  FINDINGS: Brain: There is a moderate-sized acute infarct involving the left temporal lobe, inferior left parietal lobe, lateral left occipital lobe, and left insula (posterior MCA territory and MCA/PCA border zone). There is associated cytotoxic edema with sulcal effacement. Associated susceptibility artifact along the left sylvian fissure and left temporoparietal sulci corresponds to hyperdensity on CT and may reflect a combination of subarachnoid hemorrhage and cortical petechial hemorrhage. T2 hyperintensities elsewhere in the cerebral white matter bilaterally are nonspecific but compatible with mild-to-moderate chronic small vessel ischemic disease. There is mild cerebral atrophy. No midline shift or extra-axial fluid collection is evident. Vascular: Major intracranial vascular flow voids are preserved. Skull and upper cervical spine: Unremarkable bone marrow signal. Sinuses/Orbits: Bilateral cataract extraction. Trace left mastoid effusion. Clear paranasal sinuses. Other: None. IMPRESSION: 1. Moderate-sized acute posterior left MCA infarct. 2. Mild-to-moderate chronic small vessel ischemic disease. Electronically Signed   By: Sebastian Ache M.D.   On: 02/26/2021 16:21   ECHOCARDIOGRAM COMPLETE  Result Date: 02/26/2021    ECHOCARDIOGRAM REPORT   Patient Name:   WESTLYNN FIFER Date of Exam: 02/26/2021 Medical Rec #:  [id number]     Height:       67.0 in Accession #:    [id number]    Weight:       198.9 lb Date of Birth:  1945-07-14    BSA:          2.018 m Patient Age:    75 years      BP:           161/66 mmHg Patient Gender: F             HR:           86 bpm. Exam Location:  Inpatient Procedure: 2D Echo, Color Doppler and Cardiac Doppler Indications:    Stroke i63.9  History:        Patient has no prior history of Echocardiogram examinations.  Sonographer:    Irving Burton Senior RDCS Referring Phys: [phone] ERIC LINDZEN IMPRESSIONS  1. Left ventricular ejection fraction, by estimation, is 55 to 60%. The left ventricle  has normal function. The left ventricle has no regional wall motion abnormalities. Left ventricular diastolic parameters are consistent with Grade II diastolic dysfunction (pseudonormalization).  2. Right ventricular systolic function is normal. The right ventricular size is mildly enlarged. Tricuspid regurgitation signal is inadequate for assessing PA pressure.  3. Left atrial size was mildly dilated.  4. Right atrial size was mildly dilated.  5. The mitral valve is normal in structure. No evidence of mitral valve regurgitation. No evidence of mitral stenosis.  6. The aortic valve is tricuspid. Aortic valve regurgitation is not visualized. Mild aortic valve sclerosis is present, with no evidence of aortic valve stenosis.  7. The inferior vena cava is dilated in size with >50% respiratory variability, suggesting right atrial pressure of 8 mmHg. FINDINGS  Left Ventricle: Left ventricular ejection fraction, by estimation, is 55 to 60%. The left ventricle has normal function. The left ventricle has no regional wall motion abnormalities. The left ventricular internal cavity size was normal  in size. There is  no left ventricular hypertrophy. Left ventricular diastolic parameters are consistent with Grade II diastolic dysfunction (pseudonormalization). Right Ventricle: The right ventricular size is mildly enlarged. No increase in right ventricular wall thickness. Right ventricular systolic function is normal. Tricuspid regurgitation signal is inadequate for assessing PA pressure. Left Atrium: Left atrial size was mildly dilated. Right Atrium: Right atrial size was mildly dilated. Pericardium: There is no evidence of pericardial effusion. Mitral Valve: The mitral valve is normal in structure. No evidence of mitral valve regurgitation. No evidence of mitral valve stenosis. Tricuspid Valve: The tricuspid valve is normal in structure. Tricuspid valve regurgitation is not demonstrated. Aortic Valve: The aortic valve is  tricuspid. Aortic valve regurgitation is not visualized. Mild aortic valve sclerosis is present, with no evidence of aortic valve stenosis. Pulmonic Valve: The pulmonic valve was normal in structure. Pulmonic valve regurgitation is not visualized. Aorta: The aortic root is normal in size and structure. Venous: The inferior vena cava is dilated in size with greater than 50% respiratory variability, suggesting right atrial pressure of 8 mmHg. IAS/Shunts: No atrial level shunt detected by color flow Doppler.  LEFT VENTRICLE PLAX 2D LVIDd:         3.80 cm   Diastology LVIDs:         2.40 cm   LV e' medial:    6.96 cm/s LV PW:         0.80 cm   LV E/e' medial:  11.3 LV IVS:        0.90 cm   LV e' lateral:   9.25 cm/s LVOT diam:     1.90 cm   LV E/e' lateral: 8.5 LV SV:         51 LV SV Index:   25 LVOT Area:     2.84 cm  RIGHT VENTRICLE RV S prime:     15.40 cm/s TAPSE (M-mode): 2.8 cm LEFT ATRIUM             Index        RIGHT ATRIUM           Index LA diam:        1.90 cm 0.94 cm/m   RA Area:     20.60 cm LA Vol (A2C):   45.9 ml 22.75 ml/m  RA Volume:   62.20 ml  30.83 ml/m LA Vol (A4C):   60.5 ml 29.99 ml/m LA Biplane Vol: 53.0 ml 26.27 ml/m  AORTIC VALVE LVOT Vmax:   109.00 cm/s LVOT Vmean:  72.733 cm/s LVOT VTI:    0.179 m  AORTA Ao Root diam: 3.30 cm Ao Asc diam:  3.30 cm MITRAL VALVE MV Area (PHT): 3.61 cm    SHUNTS MV Decel Time: 210 msec    Systemic VTI:  0.18 m MV E velocity: 78.50 cm/s  Systemic Diam: 1.90 cm MV A velocity: 74.40 cm/s MV E/A ratio:  1.06 Dalton McleanMD Electronically signed by Wilfred Lacy Signature Date/Time: 02/26/2021/1:42:18 PM    Final    CT HEAD CODE STROKE WO CONTRAST  Result Date: 02/25/2021 CLINICAL DATA:  Code stroke. EXAM: CT HEAD WITHOUT CONTRAST TECHNIQUE: Contiguous axial images were obtained from the base of the skull through the vertex without intravenous contrast. COMPARISON:  None. FINDINGS: Brain: No acute intracranial hemorrhage, mass effect, or edema.  Gray-white differentiation is preserved. Age-indeterminate small vessel infarct of the left gangliocapsular region. Additional patchy low-density in the supratentorial white matter is nonspecific but may reflect mild chronic microvascular  ischemic changes. Ventricles and sulci are within normal limits in size and configuration. No extra-axial collection. Vascular: No hyperdense vessel. Mild intracranial atherosclerotic calcification at the skull base. Skull: Unremarkable. Sinuses/Orbits: Aerated. Other: Mastoid air cells are clear. ASPECTS ([place]berta Stroke Program Early CT Score) - Ganglionic level infarction (caudate, lentiform nuclei, internal capsule, insula, M1-M3 cortex): 7 - Supraganglionic infarction (M4-M6 cortex): 3 Total score (0-10 with 10 being normal): 10 IMPRESSION: There is no acute intracranial hemorrhage or evidence of acute infarction. ASPECT score is 10. Age-indeterminate small vessel infarct of the left gangliocapsular region. These results were called by telephone at the time of interpretation on 02/25/2021 at 2:37 pm to provider Tanda Rockers , who verbally acknowledged these results. Electronically Signed   By: Guadlupe Spanish M.D.   On: 02/25/2021 14:39   VAS [place] LOWER EXTREMITY VENOUS (DVT)  Result Date: 02/26/2021  Lower Venous DVT Study Patient Name:  MIALEE WEYMAN  Date of Exam:   02/26/2021 Medical Rec #: [id number]      Accession #:    [id number] Date of Birth: 27-Mar-1946     Patient Gender: F Patient Age:   24 years Exam Location:  [facility] Procedure:      VAS [place] LOWER EXTREMITY VENOUS (DVT) Referring Phys: Scheryl Marten Darnice Comrie --------------------------------------------------------------------------------  Indications: Stroke.  Comparison Study: No prior study Performing Technologist: Gertie Fey MHA, RDMS, RVT, RDCS  Examination Guidelines: A complete evaluation includes B-mode imaging, spectral Doppler, color Doppler, and power Doppler as needed of all accessible portions of  each vessel. Bilateral testing is considered an integral part of a complete examination. Limited examinations for reoccurring indications may be performed as noted. The reflux portion of the exam is performed with the patient in reverse Trendelenburg.  +---------+---------------+---------+-----------+----------+--------------+ RIGHT    CompressibilityPhasicitySpontaneityPropertiesThrombus Aging +---------+---------------+---------+-----------+----------+--------------+ CFV      Full           Yes      Yes                                 +---------+---------------+---------+-----------+----------+--------------+ SFJ      Full                                                        +---------+---------------+---------+-----------+----------+--------------+ FV Prox  Full                                                        +---------+---------------+---------+-----------+----------+--------------+ FV Mid   Full                                                        +---------+---------------+---------+-----------+----------+--------------+ FV DistalFull                                                        +---------+---------------+---------+-----------+----------+--------------+  PFV      Full                                                        +---------+---------------+---------+-----------+----------+--------------+ POP      Full           Yes      Yes                                 +---------+---------------+---------+-----------+----------+--------------+ PTV      Full                                                        +---------+---------------+---------+-----------+----------+--------------+ PERO     Full                                                        +---------+---------------+---------+-----------+----------+--------------+   +---------+---------------+---------+-----------+----------+--------------+ LEFT      CompressibilityPhasicitySpontaneityPropertiesThrombus Aging +---------+---------------+---------+-----------+----------+--------------+ CFV      Full           Yes      Yes                                 +---------+---------------+---------+-----------+----------+--------------+ SFJ      Full                                                        +---------+---------------+---------+-----------+----------+--------------+ FV Prox  Full                                                        +---------+---------------+---------+-----------+----------+--------------+ FV Mid   Full                                                        +---------+---------------+---------+-----------+----------+--------------+ FV DistalFull                                                        +---------+---------------+---------+-----------+----------+--------------+ PFV      Full                                                        +---------+---------------+---------+-----------+----------+--------------+  POP      Full           Yes      Yes                                 +---------+---------------+---------+-----------+----------+--------------+ PTV      Full                                                        +---------+---------------+---------+-----------+----------+--------------+ PERO     Full                                                        +---------+---------------+---------+-----------+----------+--------------+     Summary: RIGHT: - There is no evidence of deep vein thrombosis in the lower extremity.  - No cystic structure found in the popliteal fossa.  LEFT: - There is no evidence of deep vein thrombosis in the lower extremity.  - No cystic structure found in the popliteal fossa.  *See table(s) above for measurements and observations. Electronically signed by Waverly Ferrari MD on 02/26/2021 at 4:59:49 PM.    Final    CT ANGIO HEAD  NECK W WO CM W PERF (CODE STROKE)  Result Date: 02/25/2021 CLINICAL DATA:  Neuro deficit, acute, stroke suspected EXAM: CT ANGIOGRAPHY HEAD AND NECK CT PERFUSION BRAIN TECHNIQUE: Multidetector CT imaging of the head and neck was performed using the standard protocol during bolus administration of intravenous contrast. Multiplanar CT image reconstructions and MIPs were obtained to evaluate the vascular anatomy. Carotid stenosis measurements (when applicable) are obtained utilizing NASCET criteria, using the distal internal carotid diameter as the denominator. Multiphase CT imaging of the brain was performed following IV bolus contrast injection. Subsequent parametric perfusion maps were calculated using RAPID software. CONTRAST:  OMNIPAQUE IOHEXOL 350 MG/ML SOLN COMPARISON:  None. FINDINGS: CTA NECK Aortic arch: Trace calcified plaque along the included arch. Great vessel origins are patent. Right carotid system: Patent.  No stenosis. Left carotid system: Patent. Trace mixed plaque at the bifurcation. No stenosis. Vertebral arteries: Patent and codominant.  No stenosis. Skeleton: Mild cervical spine degenerative changes. Degenerative changes at the temporomandibular joints. Other neck: Unremarkable. Upper chest: Centrilobular emphysema. Nodular but somewhat tubular opacities at the left apex probably reflect impacted airways. Review of the MIP images confirms the above findings CTA HEAD Anterior circulation: Intracranial internal carotid arteries are patent with mild calcified plaque. Left M1 MCA is patent. There is occlusion of a distal left M2 MCA branch within the posterior sylvian fissure. This may reconstitute more distally. Anterior and right middle cerebral arteries are patent. Posterior circulation: Intracranial vertebral arteries are patent. Basilar artery is patent. Major cerebellar artery origins are patent. A left posterior communicating artery is present. Posterior cerebral arteries are patent.  Venous sinuses: Patent as allowed by contrast bolus timing. Review of the MIP images confirms the above findings CT Brain Perfusion Findings: CBF (<30%) Volume: 60mL Perfusion (Tmax>6.0s) volume: 31mL Mismatch Volume: 22mL Infarction Location: Area of infarction and most territory at risk within the posterior left MCA territory. There is some artifactual calculated territory at risk  within the inferior frontal lobes. 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 May reconstitute more distally. Perfusion imaging demonstrates 3 mL of core infarction and 18 mL of penumbra within the posterior left MCA territory. The penumbra volume is overestimated as there is some artifact in the inferior frontal lobes included in the number. No occlusion or hemodynamically significant stenosis in the neck. These results were provided by telephone at the time of interpretation on 02/25/2021 at 3:55 pm to provider [facility] , who verbally acknowledged these results. Electronically Signed   By: Guadlupe Spanish M.D.   On: 02/25/2021 16:01     PHYSICAL EXAM  Temp:  [97.9 F (36.6 C)-98.5 F (36.9 C)] 98.2 F (36.8 C) (10/19 0800) Pulse Rate:  [66-142] 137 (10/19 1000) Resp:  [13-30] 23 (10/19 1000) BP: (68-140)/(28-112) 137/85 (10/19 1000) SpO2:  [89 %-100 %] 97 % (10/19 1000) Arterial Line BP: (96-117)/(82-89) 96/89 (10/18 1300)  General - Well nourished, well developed, in no apparent distress.  Ophthalmologic - fundi not visualized due to noncooperation.  Cardiovascular - irregularly irregular heart rate and rhythm, with RVR  Neuro - awake, alert, global aphasia, not following commands, able to tell me her first name and say "I look good, thank you very much", not able to name and repeat. No gaze palsy, tracking bilaterally, not consistently blinking to visual threat bilaterally, PERRL. Slight right nasolabial fold flatterning. Tongue protrusion not cooperative. Bilateral UEs 5/5,  no drift. Bilaterally LEs 5/5, no drift. Sensation, coordination not cooperative and gait not tested.    ASSESSMENT/PLAN Ms. Brady Plant is a 75 y.o. female with history of HTN, HLD admitted for aphasia. 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  MRI large inferior MCA infarct with petechial hemorrhage 2D Echo EF 55-60% LE venous doppler no DVT LDL 63 HgbA1c 5.5 UDS neg SCDs for VTE prophylaxis No antithrombotic prior to admission, now on heparin IV Ongoing aggressive stroke risk factor management Therapy recommendations:  CIR Disposition:  pending  Afib RVR Developed afib RVR overnight New diagnosis Not responding to cardizem drip Now on amiodarone drip On heparin IV CCM and card on board  Hypertension Stable Off cleviprex BP goal 120-160 given on heparin IV with petechial hemorrhage Long term BP goal normotensive  Hyperlipidemia Home meds:  lipitor 10  LDL 63, goal < 70 Now on lipitor 10 Continue statin at discharge  Other Stroke Risk Factors Advanced age Obesity, Body mass index is 31.15 kg/m.   Other Active Problems Leukocytosis WBC 12.2  Hospital day # 2  This patient is critically ill due to large left MCA stroke s/p TNK and IR, afib RVR and at significant risk of neurological worsening, death form heart failure, hemorrhagic conversion, bleeding from heparin IV. This patient's care requires constant monitoring of vital signs, hemodynamics, respiratory and cardiac monitoring, review of multiple databases, neurological assessment, discussion with family, other specialists and medical decision making of high complexity. I spent 35  minutes of neurocritical care time in the care of this patient. I discussed with Dr. Denese Killings.  Marvel Plan, MD PhD Stroke Neurology 02/27/2021 11:33 AM    To contact Stroke Continuity provider,  please refer to [email or web address] After hours, contact General Neurology

## 2021-02-27 NOTE — Progress Notes (Signed)
Patient's HR spiked into the 140s around 0015. Patient had no fever, stable vital signs, and did not appear to be in any distress. MD Thomasena Edis paged by this RN.   Orders received: Sodium chloride 0.9% bolus of 1,044ml, as well as metoprolol tartrate (lopressor) tablet 12.5 mg oral.  Patient sustained HR in the 140s. MD collins made aware by this RN and new orders were placed to start Cardizem drip. Cardizem drip initiated and once maxed out at 15mg /hr with no changes to the patient's HR, MD paged by this RN seeking CCM consult.   CCM consulted and new orders placed were to give amiodarone 150mg  bolus and begin amiodarone drip at 60mg /hr.   RN will continue to monitor.   Thomasena Edis, RN

## 2021-02-27 NOTE — Progress Notes (Signed)
ANTICOAGULATION CONSULT NOTE - Initial Consult  Pharmacy Consult for heparin Indication: atrial fibrillation  Allergies  Allergen Reactions   Aspirin     Other reaction(s): Bleeding   Penicillins Hives and Rash   Lisinopril Cough    Patient Measurements: Height: 5\' 7"  (170.2 cm) Weight: 90.2 kg (198 lb 14.4 oz) IBW/kg (Calculated) : 61.6  Vital Signs: Temp: 98.2 F (36.8 C) (10/19 0800) Temp Source: Oral (10/19 0800) BP: 127/99 (10/19 0900) Pulse Rate: 137 (10/19 0900)  Labs: Recent Labs    02/25/21 1441 02/26/21 0525  HGB 13.6 12.4  HCT 43.6 38.6  PLT 225 222  APTT 23*  --   LABPROT 13.0  --   INR 1.0  --   CREATININE 0.89 0.75    Estimated Creatinine Clearance: 70 mL/min (by C-G formula based on SCr of 0.75 mg/dL).   Medical History: History reviewed. No pertinent past medical history.  Medications:  Medications Prior to Admission  Medication Sig Dispense Refill Last Dose   albuterol (PROVENTIL) (2.5 MG/3ML) 0.083% nebulizer solution Take 2.5 mg by nebulization every 4 (four) hours as needed for wheezing.   unknown   atorvastatin (LIPITOR) 10 MG tablet Take 10 mg by mouth daily.   unknown   carboxymethylcellulose (REFRESH PLUS) 0.5 % SOLN Place 1 drop into both eyes 4 (four) times daily as needed (dry eyes).   unknown   DULERA 100-5 MCG/ACT AERO Inhale 2 puffs into the lungs 2 (two) times daily.   unknown   losartan (COZAAR) 25 MG tablet Take 25 mg by mouth daily.   unknown   Multiple Vitamin (MULTIVITAMIN) tablet Take 1 tablet by mouth daily.   unknown   omeprazole (PRILOSEC) 20 MG capsule Take 20 mg by mouth daily.   unknown   tiotropium (SPIRIVA HANDIHALER) 18 MCG inhalation capsule Place 18 mcg into inhaler and inhale daily.   unknown    Assessment: 64 YOF who presents with L MCA stroke s/p tenecteplase developed new onset Afib RVR. Pharmacy consulted to start IV heparin per stroke protocol.   H/H and Plt wnl. SCr wnl   Goal of Therapy:  Heparin  level 0.3-0.5 units/ml Monitor platelets by anticoagulation protocol: Yes   Plan:  -Start heparin infusion at 1300 units/hr. No bolus -F/u 8 hr HL -Monitor daily HL, CBC and s/s of bleeding   60, PharmD., BCPS, BCCCP Clinical Pharmacist Please refer to [facility] for unit-specific pharmacist

## 2021-02-28 DIAGNOSIS — E782 Mixed hyperlipidemia: Secondary | ICD-10-CM | POA: Diagnosis not present

## 2021-02-28 DIAGNOSIS — I480 Paroxysmal atrial fibrillation: Secondary | ICD-10-CM | POA: Diagnosis not present

## 2021-02-28 DIAGNOSIS — I639 Cerebral infarction, unspecified: Secondary | ICD-10-CM | POA: Diagnosis not present

## 2021-02-28 DIAGNOSIS — I6602 Occlusion and stenosis of left middle cerebral artery: Secondary | ICD-10-CM | POA: Diagnosis not present

## 2021-02-28 DIAGNOSIS — R4701 Aphasia: Secondary | ICD-10-CM | POA: Diagnosis not present

## 2021-02-28 DIAGNOSIS — I4891 Unspecified atrial fibrillation: Secondary | ICD-10-CM | POA: Diagnosis not present

## 2021-02-28 DIAGNOSIS — I63512 Cerebral infarction due to unspecified occlusion or stenosis of left middle cerebral artery: Secondary | ICD-10-CM | POA: Diagnosis not present

## 2021-02-28 LAB — HEPARIN LEVEL (UNFRACTIONATED)
Heparin Unfractionated: 0.55 IU/mL (ref 0.30–0.70)
Heparin Unfractionated: 0.48 IU/mL (ref 0.30–0.70)

## 2021-02-28 LAB — CBC
HCT: 37.9 % (ref 36.0–46.0)
Hemoglobin: 11.8 g/dL — ABNORMAL LOW (ref 12.0–15.0)
MCH: 30.2 pg (ref 26.0–34.0)
MCHC: 31.1 g/dL (ref 30.0–36.0)
MCV: 96.9 fL (ref 80.0–100.0)
Platelets: 228 10*3/uL (ref 150–400)
RBC: 3.91 MIL/uL (ref 3.87–5.11)
RDW: 14.4 % (ref 11.5–15.5)
WBC: 12.2 10*3/uL — ABNORMAL HIGH (ref 4.0–10.5)
nRBC: 0 % (ref 0.0–0.2)

## 2021-02-28 LAB — BASIC METABOLIC PANEL
Anion gap: 9 (ref 5–15)
BUN: 10 mg/dL (ref 8–23)
CO2: 24 mmol/L (ref 22–32)
Calcium: 8.8 mg/dL — ABNORMAL LOW (ref 8.9–10.3)
Chloride: 109 mmol/L (ref 98–111)
Creatinine, Ser: 0.8 mg/dL (ref 0.44–1.00)
GFR, Estimated: >60 mL/min (ref >60)
Glucose, Bld: 113 mg/dL — ABNORMAL HIGH (ref 70–99)
Potassium: 3.8 mmol/L (ref 3.5–5.1)
Sodium: 142 mmol/L (ref 135–145)

## 2021-02-28 LAB — MAGNESIUM: Magnesium: 1.6 mg/dL — ABNORMAL LOW (ref 1.7–2.4)

## 2021-02-28 MED ORDER — DILTIAZEM HCL 60 MG PO TABS
60.0000 mg | ORAL_TABLET | Freq: Four times a day (QID) | ORAL | Status: DC
  Administered 2021-02-28 – 2021-03-01 (×3): 60 mg via ORAL
  Filled 2021-02-28 (×8): qty 1

## 2021-02-28 MED ORDER — POTASSIUM CHLORIDE 10 MEQ/100ML IV SOLN: 10.0000 meq | INTRAVENOUS | Status: AC

## 2021-02-28 MED ORDER — METOPROLOL TARTRATE 5 MG/5ML IV SOLN
5.0000 mg | Freq: Once | INTRAVENOUS | Status: AC
  Administered 2021-02-28: 5 mg via INTRAVENOUS
  Filled 2021-02-28: qty 5

## 2021-02-28 MED ORDER — MAGNESIUM OXIDE -MG SUPPLEMENT 400 (240 MG) MG PO TABS
400.0000 mg | ORAL_TABLET | Freq: Every day | ORAL | Status: DC
  Administered 2021-02-28 – 2021-03-07 (×8): 400 mg via ORAL
  Filled 2021-02-28 (×8): qty 1

## 2021-02-28 MED ORDER — POTASSIUM CHLORIDE CRYS ER 20 MEQ PO TBCR
40.0000 meq | EXTENDED_RELEASE_TABLET | Freq: Once | ORAL | Status: AC
  Administered 2021-02-28: 40 meq via ORAL
  Filled 2021-02-28: qty 2

## 2021-02-28 MED ORDER — DILTIAZEM HCL 60 MG PO TABS
60.0000 mg | ORAL_TABLET | Freq: Three times a day (TID) | ORAL | Status: DC
  Filled 2021-02-28: qty 1

## 2021-02-28 MED ORDER — METOPROLOL TARTRATE 25 MG PO TABS
25.0000 mg | ORAL_TABLET | Freq: Two times a day (BID) | ORAL | Status: DC
  Administered 2021-02-28: 25 mg via ORAL
  Filled 2021-02-28: qty 1

## 2021-02-28 MED ORDER — DILTIAZEM HCL 30 MG PO TABS
30.0000 mg | ORAL_TABLET | Freq: Once | ORAL | Status: AC
  Administered 2021-02-28: 30 mg via ORAL
  Filled 2021-02-28: qty 1

## 2021-02-28 MED ORDER — MAGNESIUM SULFATE 2 GM/50ML IV SOLN: 2.0000 g | Freq: Once | INTRAVENOUS | Status: DC

## 2021-02-28 NOTE — Consult Note (Signed)
NAME:  Michelle Dean, MRN:  [id number], DOB:  1946-04-12, LOS: 3 ADMISSION DATE:  02/25/2021, CONSULTATION DATE:  02/27/21 REFERRING MD:  Thomasena Edis CHIEF COMPLAINT:  Aphasia   History of Present Illness:  Michelle Dean is a 75 y.o. female who has a PMH as outlined below.  She presented to any [facility] ED 10/17 with acute onset of confusion and expressive and receptive aphasia.  CT head was negative for hemorrhage.  She received TNKase.  CTA revealed left M2/M3 occlusion for which she was transferred to [facility] for angiogram and possible thrombectomy.  She was taken to IR and had partial recanalization with reocclusion followed by mechanical thrombolysis achieving a TICI 2C revascularization.  10/19, she had A. fib with RVR.  She was started on Cardizem however RVR persisted.  PCCM subsequently asked to see in consultation.  Pertinent  Medical History:  has Acute ischemic stroke (HCC); Stroke (cerebrum) (HCC); Middle cerebral artery embolism, left; Aphasia; Atrial fibrillation (HCC); Atrial flutter with rapid ventricular response (HCC); Benign hypertension; and Mixed hyperlipidemia on their problem list.  Significant Hospital Events: Including procedures, antibiotic start and stop dates in addition to other pertinent events   10/17 > admit. 10/19 > PCCM consult for A.fib RVR.  Interim History / Subjective:  Awake, HR ranging from 105 to 140, worse with mobilization. Remains aphasic  Objective:  Blood pressure 111/75, pulse (!) 105, temperature 98.2 F (36.8 C), temperature source Oral, resp. rate (!) 27, height 5\' 7"  (1.702 m), weight 90.2 kg, SpO2 98 %.        Intake/Output Summary (Last 24 hours) at 02/28/2021 1703 Last data filed at 02/28/2021 1600 Gross per 24 hour  Intake 1244 ml  Output 950 ml  Net 294 ml    Filed Weights   02/25/21 1452 02/25/21 1514  Weight: 99.8 kg 90.2 kg    Examination: General: Adult female, resting in bed, in NAD. Neuro: Awake, has expressive  aphasia. MAE's. HEENT: [place]/AT. Sclerae anicteric. EOMI. Cardiovascular: IRIR, tachy, no M/R/G.  Lungs: Respirations even and unlabored.  CTA bilaterally, No W/R/R. Abdomen: BS x 4, soft, NT/ND.  Musculoskeletal: No gross deformities, no edema.  Skin: Intact, warm, no rashes.    Labs/imaging personally reviewed:  CTA head 10/17 > Left M2/M3 occlusion. CT 10/18 > left MCA contrast staining, hypodensity in left temporal lobe. MRI brain 10/18 > mod posterior left MCA infarct, mild to mod chronic small vessel ischemic disease. Echo 10/18 > EF 55-60%, G2DD, mildly enlarged RV/LA/RA. LE duplex 10/18 > neg. TSH normal. Assessment & Plan:   Left M2/M3 occlusion - s/p TNKase and partial recanalization with reocclusion followed by mechanical thrombolysis achieving a TICI 2C revascularization.. - Stroke team managing. - Continue ASA. -SLP for aphasia -Progressive ambulation.  A.fib with RVR - new onset. - Continue amiodarone iv - wean to 62ml/h -Avoid long-term amiodarone given underlying lung disease -Continue IV heparin -Increased oral diltiazem.  Consider adding an oral beta-blocker as well -Tentatively scheduled for TEE guided cardioversion tomorrow per Dr. 31m  Hx HTN, HLD. - Continue Lipitor, Labetalol PRN, ASA.  Hx COPD per report with intermittent O2 use. - Continue supplemental O2 as needed to maintain SpO2 > 92%. - Continue BD's.  Best practice (evaluated daily):  Per primary.  Labs   CBC: Recent Labs  Lab 02/25/21 1441 02/26/21 0525 02/28/21 0356  WBC 9.9 12.2* 12.2*  NEUTROABS 6.2 10.2*  --   HGB 13.6 12.4 11.8*  HCT 43.6 38.6 37.9  MCV 95.8 93.9 96.9  PLT 225 222 228     Basic Metabolic Panel: Recent Labs  Lab 02/25/21 1441 02/26/21 0525 02/28/21 0356  NA 141 142 142  K 3.4* 3.6 3.8  CL 107 109 109  CO2 26 25 24   GLUCOSE 113* 142* 113*  BUN 21 10 10   CREATININE 0.89 0.75 0.80  CALCIUM 8.8* 8.4* 8.8*  MG  --   --  1.6*    GFR: Estimated  Creatinine Clearance: 70 mL/min (by C-G formula based on SCr of 0.8 mg/dL). Recent Labs  Lab 02/25/21 1441 02/26/21 0525 02/28/21 0356  WBC 9.9 12.2* 12.2*     Liver Function Tests: Recent Labs  Lab 02/25/21 1441  AST 20  ALT 14  ALKPHOS 56  BILITOT 0.6  PROT 6.8  ALBUMIN 3.9    No results for input(s): LIPASE, AMYLASE in the last 168 hours. No results for input(s): AMMONIA in the last 168 hours.  ABG No results found for: PHART, PCO2ART, PO2ART, HCO3, TCO2, ACIDBASEDEF, O2SAT   Coagulation Profile: Recent Labs  Lab 02/25/21 1441  INR 1.0     Cardiac Enzymes: No results for input(s): CKTOTAL, CKMB, CKMBINDEX, TROPONINI in the last 168 hours.  HbA1C: Hgb A1c MFr Bld  Date/Time Value Ref Range Status  02/26/2021 05:25 AM 5.5 4.8 - 5.6 % Final    Comment:    (NOTE) Pre diabetes:          5.7%-6.4%  Diabetes:              >6.4%  Glycemic control for   <7.0% adults with diabetes     CBG: Recent Labs  Lab 02/25/21 1420  GLUCAP 99    CRITICAL CARE Performed by: 02/28/2021   Total critical care time: 35 minutes  Critical care time was exclusive of separately billable procedures and treating other patients.  Critical care was necessary to treat or prevent imminent or life-threatening deterioration.  Critical care was time spent personally by me on the following activities: development of treatment plan with patient and/or surrogate as well as nursing, discussions with consultants, evaluation of patient's response to treatment, examination of patient, obtaining history from patient or surrogate, ordering and performing treatments and interventions, ordering and review of laboratory studies, ordering and review of radiographic studies, pulse oximetry, re-evaluation of patient's condition and participation in multidisciplinary rounds.  02/27/21, MD [facility] ICU Physician [facility] [place]  Pager: [phone] Mobile: [phone] After  hours: [phone].

## 2021-02-28 NOTE — Progress Notes (Signed)
Physical Therapy Treatment Patient Details Name: Michelle Dean MRN: [id number] DOB: [birth year] Today's Date: 02/28/2021   History of Present Illness 75 yo female presents to [facility] on 10/17 with aphasia. Imaging reveals Left MCA distal M2 stroke, currently embolic of undetermined etiology, transferred to [facility] for intervention. s/p L common carotid arteriogram via R CFA, partial recanalization with reocclusion with x 1 pass with 40 mm solitaireX retriever and contact aspiration,x1 pass with contact aspiration and x1 pass with mechanical thrombolysis  acieving a TICI 2C revascularization on 10/17. PMH includes COPD (intermittently uses oxygen at home), hypertension, hyperlipidemia.    PT Comments    Pt smiling and states "you ready?" When PT mentions walking and gestures towards door. Pt continues to require light assist to steady, especially with directional changes or when objects are to pt's R given inattention. PT intentionally placed objects towards pt's R to see if she would avoid them during gait, did so about 50% of the time. PT to continue to progress pt mobility as able, progressing well.     Recommendations for follow up therapy are one component of a multi-disciplinary discharge planning process, led by the attending physician.  Recommendations may be updated based on patient status, additional functional criteria and insurance authorization.  Follow Up Recommendations  CIR     Equipment Recommendations  None recommended by PT    Recommendations for Other Services Rehab consult     Precautions / Restrictions Precautions Precautions: Fall Precaution Comments: receptive aphasia - responding most consistently with demonstrative and tactile cuing Restrictions Weight Bearing Restrictions: No     Mobility  Bed Mobility Overal bed mobility: Needs Assistance Bed Mobility: Supine to Sit     Supine to sit: Supervision     General bed mobility comments: up in chair     Transfers Overall transfer level: Needs assistance Equipment used: None Transfers: Sit to/from Stand Sit to Stand: Min guard         General transfer comment: for safety, STS x2 from recliner and toilet.  Ambulation/Gait Ambulation/Gait assistance: Min assist Gait Distance (Feet): 120 Feet Assistive device: None Gait Pattern/deviations: Step-through pattern;Decreased stride length;Drifts right/left Gait velocity: decr   General Gait Details: min assist to steady and redirect path, pt with LOB x1 with turn 90 deg towards R and tends to bump into objects on R. HRmax 130s bpm during gait   Stairs             Wheelchair Mobility    Modified Rankin (Stroke Patients Only) Modified Rankin (Stroke Patients Only) Pre-Morbid Rankin Score: No symptoms Modified Rankin: Moderately severe disability     Balance Overall balance assessment: Needs assistance Sitting-balance support: No upper extremity supported;Feet supported Sitting balance-Leahy Scale: Fair Sitting balance - Comments: able to sit EOB without assist   Standing balance support: No upper extremity supported;During functional activity Standing balance-Leahy Scale: Fair                              Cognition Arousal/Alertness: Awake/alert Behavior During Therapy: WFL for tasks assessed/performed Overall Cognitive Status: Difficult to assess                                        Exercises      General Comments General comments (skin integrity, edema, etc.): Max HR 138; BP stable  Pertinent Vitals/Pain Pain Assessment: Faces Faces Pain Scale: No hurt Pain Intervention(s): Monitored during session    Home Living                      Prior Function            PT Goals (current goals can now be found in the care plan section) Acute Rehab PT Goals Patient Stated Goal: return home PT Goal Formulation: With patient/family Time For Goal Achievement:  03/12/21 Potential to Achieve Goals: Good Progress towards PT goals: Progressing toward goals    Frequency    Min 4X/week      PT Plan Current plan remains appropriate    Co-evaluation              AM-PAC PT "6 Clicks" Mobility   Outcome Measure  Help needed turning from your back to your side while in a flat bed without using bedrails?: A Little Help needed moving from lying on your back to sitting on the side of a flat bed without using bedrails?: A Little Help needed moving to and from a bed to a chair (including a wheelchair)?: A Little Help needed standing up from a chair using your arms (e.g., wheelchair or bedside chair)?: A Little Help needed to walk in hospital room?: A Little Help needed climbing 3-5 steps with a railing? : A Lot 6 Click Score: 17    End of Session Equipment Utilized During Treatment: Oxygen;Gait belt (3LO2 during gait) Activity Tolerance: Patient tolerated treatment well Patient left: in chair;with call bell/phone within reach;with chair alarm set Nurse Communication: Mobility status PT Visit Diagnosis: Muscle weakness (generalized) (M62.81)     Time: 1051-1110 PT Time Calculation (min) (ACUTE ONLY): 19 min  Charges:  $Gait Training: 8-22 mins                     Marye Round, PT DPT Acute Rehabilitation Services Pager [phone]  Office [phone]    Tyrone Apple E Christain [place] 02/28/2021, 11:34 AM

## 2021-02-28 NOTE — Progress Notes (Signed)
STROKE TEAM PROGRESS NOTE   SUBJECTIVE (INTERVAL HISTORY) Her RN is at the bedside. Pt sitting in chair, neuro stable still has receptive aphasia > expressive aphasia, moving all extremities. Still has afib RVR but HR now 95-110, better than yesterday, still on amiodarone drip, and cardizem po, on heparin IV  OBJECTIVE Temp:  [97.6 F (36.4 C)-98.4 F (36.9 C)] 98.2 F (36.8 C) (10/20 0754) Pulse Rate:  [62-138] 71 (10/20 1000) Cardiac Rhythm: Atrial fibrillation (10/20 0800) Resp:  [15-31] 30 (10/20 1000) BP: (102-133)/(53-92) 124/89 (10/20 1000) SpO2:  [80 %-100 %] 99 % (10/20 1000)  Recent Labs  Lab 02/25/21 1420  GLUCAP 99   Recent Labs  Lab 02/25/21 1441 02/26/21 0525 02/28/21 0356  NA 141 142 142  K 3.4* 3.6 3.8  CL 107 109 109  CO2 26 25 24   GLUCOSE 113* 142* 113*  BUN 21 10 10   CREATININE 0.89 0.75 0.80  CALCIUM 8.8* 8.4* 8.8*  MG  --   --  1.6*   Recent Labs  Lab 02/25/21 1441  AST 20  ALT 14  ALKPHOS 56  BILITOT 0.6  PROT 6.8  ALBUMIN 3.9   Recent Labs  Lab 02/25/21 1441 02/26/21 0525 02/28/21 0356  WBC 9.9 12.2* 12.2*  NEUTROABS 6.2 10.2*  --   HGB 13.6 12.4 11.8*  HCT 43.6 38.6 37.9  MCV 95.8 93.9 96.9  PLT 225 222 228   No results for input(s): CKTOTAL, CKMB, CKMBINDEX, TROPONINI in the last 168 hours. Recent Labs    02/25/21 1441  LABPROT 13.0  INR 1.0   Recent Labs    02/26/21 0256  COLORURINE YELLOW  LABSPEC >1.046*  PHURINE 6.0  GLUCOSEU NEGATIVE  HGBUR LARGE*  BILIRUBINUR NEGATIVE  KETONESUR 20*  PROTEINUR NEGATIVE  NITRITE NEGATIVE  LEUKOCYTESUR NEGATIVE       Component Value Date/Time   CHOL 131 02/26/2021 0525   TRIG 55 02/26/2021 0525   HDL 57 02/26/2021 0525   CHOLHDL 2.3 02/26/2021 0525   VLDL 11 02/26/2021 0525   LDLCALC 63 02/26/2021 0525   Lab Results  Component Value Date   HGBA1C 5.5 02/26/2021      Component Value Date/Time   LABOPIA NONE DETECTED 02/26/2021 0256   COCAINSCRNUR NONE DETECTED  02/26/2021 0256   LABBENZ NONE DETECTED 02/26/2021 0256   AMPHETMU NONE DETECTED 02/26/2021 0256   THCU NONE DETECTED 02/26/2021 0256   LABBARB NONE DETECTED 02/26/2021 0256    Recent Labs  Lab 02/25/21 1441  ETH <10    I have personally reviewed the radiological images below and agree with the radiology interpretations.  CT HEAD WO CONTRAST (02/28/2021)  Result Date: 02/26/2021 CLINICAL DATA:  Stroke follow-up EXAM: CT HEAD WITHOUT CONTRAST TECHNIQUE: Contiguous axial images were obtained from the base of the skull through the vertex without intravenous contrast. COMPARISON:  None. FINDINGS: Brain: High-density material in the left temporal and posterior frontal lobe, status post interval interventional thrombectomy. Area of hypodensity in the left temporal lobe, most likely edema. No new cortical infarct. No hydrocephalus, extra-axial collection, mass, mass effect, or midline shift. Vascular: No hyperdense vessel or unexpected calcification. Skull: Normal. Negative for fracture or focal lesion. Sinuses/Orbits: No acute finding. Other: None. IMPRESSION: 1. Hyperdense material in the left temporal and frontal lobes, in an area that has recently been treated with interventional thrombectomy, favored to represent contrast staining and not hemorrhage. Recommend follow-up exam in 6 hours for further evaluation. 2. Hypodensity in the left temporal lobe, possibly edema  from recent infarct. These results were called by telephone at the time of interpretation on 02/26/2021 at 3:02 Am to provider [facility] , who verbally acknowledged these results. Electronically Signed   By: Wiliam Ke M.D.   On: 02/26/2021 03:03   MR BRAIN WO CONTRAST  Result Date: 02/26/2021 CLINICAL DATA:  Stroke follow-up. Aphasia. Status post TNK and revascularization of an occluded left MCA branch vessel. EXAM: MRI HEAD WITHOUT CONTRAST TECHNIQUE: Multiplanar, multiecho pulse sequences of the brain and surrounding structures  were obtained without intravenous contrast. COMPARISON:  Head CT 02/26/2021 FINDINGS: Brain: There is a moderate-sized acute infarct involving the left temporal lobe, inferior left parietal lobe, lateral left occipital lobe, and left insula (posterior MCA territory and MCA/PCA border zone). There is associated cytotoxic edema with sulcal effacement. Associated susceptibility artifact along the left sylvian fissure and left temporoparietal sulci corresponds to hyperdensity on CT and may reflect a combination of subarachnoid hemorrhage and cortical petechial hemorrhage. T2 hyperintensities elsewhere in the cerebral white matter bilaterally are nonspecific but compatible with mild-to-moderate chronic small vessel ischemic disease. There is mild cerebral atrophy. No midline shift or extra-axial fluid collection is evident. Vascular: Major intracranial vascular flow voids are preserved. Skull and upper cervical spine: Unremarkable bone marrow signal. Sinuses/Orbits: Bilateral cataract extraction. Trace left mastoid effusion. Clear paranasal sinuses. Other: None. IMPRESSION: 1. Moderate-sized acute posterior left MCA infarct. 2. Mild-to-moderate chronic small vessel ischemic disease. Electronically Signed   By: Sebastian Ache M.D.   On: 02/26/2021 16:21   IR CT Head Ltd  Result Date: 02/28/2021 INDICATION: New onset aphasia. Occluded left middle cerebral artery inferior division in the distal M2 M3 region on CT angiogram of the head and neck. EXAM: 1. EMERGENT LARGE VESSEL OCCLUSION THROMBOLYSIS (anterior CIRCULATION) COMPARISON:  CT angiogram of the head and neck of 02/25/2021. MEDICATIONS: Ancef 2 g IV antibiotic was administered within 1 hour of the procedure. ANESTHESIA/SEDATION: General anesthesia. CONTRAST:  Omnipaque 300 approximately 90 cc. FLUOROSCOPY TIME:  Fluoroscopy Time: 49 minutes 0 seconds (2707 mGy). COMPLICATIONS: None immediate. TECHNIQUE: Following a full explanation of the procedure along with the  potential associated complications, an informed witnessed consent was obtained. The risks of intracranial hemorrhage of 10%, worsening neurological deficit, ventilator dependency, death and inability to revascularize were all reviewed in detail with the patient's son. The patient was then put under general anesthesia by the Department of Anesthesiology at [facility]. The right groin was prepped and draped in the usual sterile fashion. Thereafter using modified Seldinger technique, transfemoral access into the right common femoral artery was obtained without difficulty. Over a 0.035 inch guidewire an8 [place] Pinnacle 25 cm sheath was inserted. Through this, and also over a 0.035 inch guidewire a 5 [place] JB 1 catheter was advanced to the aortic arch region and selectively positioned in the left common carotid artery. FINDINGS: The left common carotid arteriogram demonstrates the left external carotid artery and its major branches to be widely patent. The left internal carotid artery at the bulb to the skull base is widely patent. The petrous, the cavernous and the supraclinoid segments are widely patent. A left posterior communicating artery is seen opacifying the left posterior cerebral artery distribution. The left anterior cerebral artery opacifies into the capillary and venous phases. The left middle cerebral artery M1 segment and superior division is widely patent. The inferior division superior parietal branch demonstrates wide patency into the capillary and venous phases. Complete angiographic occlusion is seen of the inferior division in the  distal M2 M3 segment. PROCEDURE: Over a 0.035 inch exchange Roadrunner guidewire, the 5 [place] diagnostic catheter was exchanged for a 95 cm 087 balloon guide catheter which had been prepped with 50% contrast and 50% heparinized saline infusion and advanced to the distal cervical left ICA. The guidewire was removed. Good aspiration obtained from the hub of the  balloon guide catheter. Gentle control arteriogram performed through the balloon guide catheter demonstrated no evidence of spasms, dissections or of intraluminal filling defects. No change was seen in the intracranial circulation. Over a 0.014 inch standard Synchro micro guidewire with a moderate J configuration, an 021 160 cm microcatheter was advanced in combination with an 071 132 cm Zoom aspiration catheter to supraclinoid left ICA. The micro guidewire was then gently manipulated with a torque device and advanced into the occluded angular branch of the inferior division in the distal M2 M3 segment. The guidewire was removed. Slow aspiration of contrast was noted from the hub of the microcatheter. A gentle contrast injection demonstrates safe position of the tip of the microcatheter which was then connected to continuous heparinized saline infusion. A 3 mm x 40 mm Solitaire X retrieval device was then advanced to the distal end of the microcatheter and deployed in the usual manner. The 071 Zoom aspiration catheter was advanced into the origin of the inferior division. With constant aspiration at the hub of the balloon guide catheter with proximal flow arrest in the left internal carotid artery and aspiration with a Penumbra device the hub of the Zoom aspiration catheter for approximately 2 minutes, the combination of the retrieval device, the microcatheter and the Zoom aspiration catheter was retrieved and removed. A control arteriogram performed following reversal of flow arrest in the left internal carotid artery demonstrated modest improvement in distal flow into the M3 region of the angular branch. However, this was not sustained. A second pass was then made again using combination of the 132 071 Zoom aspiration catheter advanced with an 021 microcatheter again over a 0.014 inch. Access was obtained into the occluded inferior division angular branch followed by the microcatheter. However, the microcatheter  could not be advanced more distally. A gentle control arteriogram performed demonstrated safe position without evidence of any extravasation. At this time the Zoom aspiration catheter was advanced to as close as possible to the occluded middle cerebral artery branch. Contact aspiration was then performed with an 035 Zoom aspiration catheter which had been advanced and engaged in the occluded segment of the angular branch. Aspiration was then performed with a Penumbra aspiration device at the hub of the 071 Zoom aspiration catheter with gentle aspiration with a 20 mL syringe at the hub of the 035 Zoom aspiration catheter for approximately 2 minutes. After that, the combination of the Zoom aspiration catheter was retrieved and removed, copious amounts of clot was noted in the aspirate from the 071 Zoom aspiration catheter. A control arteriogram performed through the balloon guide catheter in the left internal carotid artery continued to demonstrate near occlusion of the distal left M2 segment into the M3 region. 3 aliquots of 25 mcg of nitroglycerin was then infused through the balloon aspiration catheter in the left internal carotid artery. However, again noted was just a minimal amount of distal flow past the occlusion site. A third pass was made again using just an 021 160 cm microcatheter which was advanced to the site of the occlusion of the angular branch over a 0.014 inch standard Synchro micro guidewire without difficulty. Multiple attempts  were made to advance the micro guidewire gently through the occlusive site without success. The microcatheter was removed. Control arteriogram performed through the balloon catheter in the left internal carotid artery continued to demonstrate wide patency of the left middle cerebral artery territory superior divisions and the anterior parietal branch of the inferior division. No significant change was noted in the occluded angular branch in the distal left M2 M3 region.  Throughout the procedure, the patient's blood pressure and neurological status remained stable. No evidence of extravasation was noted. The balloon guide was then removed. The 8 French Pinnacle sheath was removed. Hemostasis was achieved with a 6 French Angio-Seal closure device, and a quick clot compression for about 20 minutes. A CT of the brain demonstrated no evidence of mass effect or midline shift. Contrast was noted in the left anterior perisylvian fissure, and also in the temporoparietal convexity area. Distal pulses remained Dopplerable in both feet unchanged. The patient was extubated. Upon recovery, the patient was able to move all fours spontaneously and to command. She did exhibit difficulty with comprehension. She was then transferred to the neuro ICU for post revascularization care. IMPRESSION: Status post attempted revascularization of occluded distal left MCA inferior division angular branch in the M2 M3 region with 1 pass with a 3 mm x 40 mm Solitaire X retrieval device and contact aspiration, 1 pass with contact aspiration and 1 pass with mechanical thrombolysis with a TICI 2C revascularization. PLAN: Follow-up in the clinic approximately 4 weeks post discharge. Electronically Signed   By: Julieanne Cotton M.D.   On: 02/28/2021 08:36   ECHOCARDIOGRAM COMPLETE  Result Date: 02/26/2021    ECHOCARDIOGRAM REPORT   Patient Name:   REIGHN KAPLAN Date of Exam: 02/26/2021 Medical Rec #:  [id number]     Height:       67.0 in Accession #:    [id number]    Weight:       198.9 lb Date of Birth:  1946/01/15    BSA:          2.018 m Patient Age:    74 years      BP:           161/66 mmHg Patient Gender: F             HR:           86 bpm. Exam Location:  Inpatient Procedure: 2D Echo, Color Doppler and Cardiac Doppler Indications:    Stroke i63.9  History:        Patient has no prior history of Echocardiogram examinations.  Sonographer:    Irving Burton Senior RDCS Referring Phys: [phone] ERIC LINDZEN IMPRESSIONS   1. Left ventricular ejection fraction, by estimation, is 55 to 60%. The left ventricle has normal function. The left ventricle has no regional wall motion abnormalities. Left ventricular diastolic parameters are consistent with Grade II diastolic dysfunction (pseudonormalization).  2. Right ventricular systolic function is normal. The right ventricular size is mildly enlarged. Tricuspid regurgitation signal is inadequate for assessing PA pressure.  3. Left atrial size was mildly dilated.  4. Right atrial size was mildly dilated.  5. The mitral valve is normal in structure. No evidence of mitral valve regurgitation. No evidence of mitral stenosis.  6. The aortic valve is tricuspid. Aortic valve regurgitation is not visualized. Mild aortic valve sclerosis is present, with no evidence of aortic valve stenosis.  7. The inferior vena cava is dilated in size with >50% respiratory variability, suggesting right atrial pressure of  8 mmHg. FINDINGS  Left Ventricle: Left ventricular ejection fraction, by estimation, is 55 to 60%. The left ventricle has normal function. The left ventricle has no regional wall motion abnormalities. The left ventricular internal cavity size was normal in size. There is  no left ventricular hypertrophy. Left ventricular diastolic parameters are consistent with Grade II diastolic dysfunction (pseudonormalization). Right Ventricle: The right ventricular size is mildly enlarged. No increase in right ventricular wall thickness. Right ventricular systolic function is normal. Tricuspid regurgitation signal is inadequate for assessing PA pressure. Left Atrium: Left atrial size was mildly dilated. Right Atrium: Right atrial size was mildly dilated. Pericardium: There is no evidence of pericardial effusion. Mitral Valve: The mitral valve is normal in structure. No evidence of mitral valve regurgitation. No evidence of mitral valve stenosis. Tricuspid Valve: The tricuspid valve is normal in structure.  Tricuspid valve regurgitation is not demonstrated. Aortic Valve: The aortic valve is tricuspid. Aortic valve regurgitation is not visualized. Mild aortic valve sclerosis is present, with no evidence of aortic valve stenosis. Pulmonic Valve: The pulmonic valve was normal in structure. Pulmonic valve regurgitation is not visualized. Aorta: The aortic root is normal in size and structure. Venous: The inferior vena cava is dilated in size with greater than 50% respiratory variability, suggesting right atrial pressure of 8 mmHg. IAS/Shunts: No atrial level shunt detected by color flow Doppler.  LEFT VENTRICLE PLAX 2D LVIDd:         3.80 cm   Diastology LVIDs:         2.40 cm   LV e' medial:    6.96 cm/s LV PW:         0.80 cm   LV E/e' medial:  11.3 LV IVS:        0.90 cm   LV e' lateral:   9.25 cm/s LVOT diam:     1.90 cm   LV E/e' lateral: 8.5 LV SV:         51 LV SV Index:   25 LVOT Area:     2.84 cm  RIGHT VENTRICLE RV S prime:     15.40 cm/s TAPSE (M-mode): 2.8 cm LEFT ATRIUM             Index        RIGHT ATRIUM           Index LA diam:        1.90 cm 0.94 cm/m   RA Area:     20.60 cm LA Vol (A2C):   45.9 ml 22.75 ml/m  RA Volume:   62.20 ml  30.83 ml/m LA Vol (A4C):   60.5 ml 29.99 ml/m LA Biplane Vol: 53.0 ml 26.27 ml/m  AORTIC VALVE LVOT Vmax:   109.00 cm/s LVOT Vmean:  72.733 cm/s LVOT VTI:    0.179 m  AORTA Ao Root diam: 3.30 cm Ao Asc diam:  3.30 cm MITRAL VALVE MV Area (PHT): 3.61 cm    SHUNTS MV Decel Time: 210 msec    Systemic VTI:  0.18 m MV E velocity: 78.50 cm/s  Systemic Diam: 1.90 cm MV A velocity: 74.40 cm/s MV E/A ratio:  1.06 Dalton McleanMD Electronically signed by Wilfred Lacy Signature Date/Time: 02/26/2021/1:42:18 PM    Final    IR PERCUTANEOUS ART THROMBECTOMY/INFUSION INTRACRANIAL INC DIAG ANGIO  Result Date: 02/28/2021 INDICATION: New onset aphasia. Occluded left middle cerebral artery inferior division in the distal M2 M3 region on CT angiogram of the head and neck. EXAM: 1.  EMERGENT LARGE  VESSEL OCCLUSION THROMBOLYSIS (anterior CIRCULATION) COMPARISON:  CT angiogram of the head and neck of 02/25/2021. MEDICATIONS: Ancef 2 g IV antibiotic was administered within 1 hour of the procedure. ANESTHESIA/SEDATION: General anesthesia. CONTRAST:  Omnipaque 300 approximately 90 cc. FLUOROSCOPY TIME:  Fluoroscopy Time: 49 minutes 0 seconds (2707 mGy). COMPLICATIONS: None immediate. TECHNIQUE: Following a full explanation of the procedure along with the potential associated complications, an informed witnessed consent was obtained. The risks of intracranial hemorrhage of 10%, worsening neurological deficit, ventilator dependency, death and inability to revascularize were all reviewed in detail with the patient's son. The patient was then put under general anesthesia by the Department of Anesthesiology at [facility]. The right groin was prepped and draped in the usual sterile fashion. Thereafter using modified Seldinger technique, transfemoral access into the right common femoral artery was obtained without difficulty. Over a 0.035 inch guidewire an8 [place] Pinnacle 25 cm sheath was inserted. Through this, and also over a 0.035 inch guidewire a 5 [place] JB 1 catheter was advanced to the aortic arch region and selectively positioned in the left common carotid artery. FINDINGS: The left common carotid arteriogram demonstrates the left external carotid artery and its major branches to be widely patent. The left internal carotid artery at the bulb to the skull base is widely patent. The petrous, the cavernous and the supraclinoid segments are widely patent. A left posterior communicating artery is seen opacifying the left posterior cerebral artery distribution. The left anterior cerebral artery opacifies into the capillary and venous phases. The left middle cerebral artery M1 segment and superior division is widely patent. The inferior division superior parietal branch demonstrates wide patency  into the capillary and venous phases. Complete angiographic occlusion is seen of the inferior division in the distal M2 M3 segment. PROCEDURE: Over a 0.035 inch exchange Roadrunner guidewire, the 5 [place] diagnostic catheter was exchanged for a 95 cm 087 balloon guide catheter which had been prepped with 50% contrast and 50% heparinized saline infusion and advanced to the distal cervical left ICA. The guidewire was removed. Good aspiration obtained from the hub of the balloon guide catheter. Gentle control arteriogram performed through the balloon guide catheter demonstrated no evidence of spasms, dissections or of intraluminal filling defects. No change was seen in the intracranial circulation. Over a 0.014 inch standard Synchro micro guidewire with a moderate J configuration, an 021 160 cm microcatheter was advanced in combination with an 071 132 cm Zoom aspiration catheter to supraclinoid left ICA. The micro guidewire was then gently manipulated with a torque device and advanced into the occluded angular branch of the inferior division in the distal M2 M3 segment. The guidewire was removed. Slow aspiration of contrast was noted from the hub of the microcatheter. A gentle contrast injection demonstrates safe position of the tip of the microcatheter which was then connected to continuous heparinized saline infusion. A 3 mm x 40 mm Solitaire X retrieval device was then advanced to the distal end of the microcatheter and deployed in the usual manner. The 071 Zoom aspiration catheter was advanced into the origin of the inferior division. With constant aspiration at the hub of the balloon guide catheter with proximal flow arrest in the left internal carotid artery and aspiration with a Penumbra device the hub of the Zoom aspiration catheter for approximately 2 minutes, the combination of the retrieval device, the microcatheter and the Zoom aspiration catheter was retrieved and removed. A control arteriogram performed  following reversal of flow arrest in the left  internal carotid artery demonstrated modest improvement in distal flow into the M3 region of the angular branch. However, this was not sustained. A second pass was then made again using combination of the 132 071 Zoom aspiration catheter advanced with an 021 microcatheter again over a 0.014 inch. Access was obtained into the occluded inferior division angular branch followed by the microcatheter. However, the microcatheter could not be advanced more distally. A gentle control arteriogram performed demonstrated safe position without evidence of any extravasation. At this time the Zoom aspiration catheter was advanced to as close as possible to the occluded middle cerebral artery branch. Contact aspiration was then performed with an 035 Zoom aspiration catheter which had been advanced and engaged in the occluded segment of the angular branch. Aspiration was then performed with a Penumbra aspiration device at the hub of the 071 Zoom aspiration catheter with gentle aspiration with a 20 mL syringe at the hub of the 035 Zoom aspiration catheter for approximately 2 minutes. After that, the combination of the Zoom aspiration catheter was retrieved and removed, copious amounts of clot was noted in the aspirate from the 071 Zoom aspiration catheter. A control arteriogram performed through the balloon guide catheter in the left internal carotid artery continued to demonstrate near occlusion of the distal left M2 segment into the M3 region. 3 aliquots of 25 mcg of nitroglycerin was then infused through the balloon aspiration catheter in the left internal carotid artery. However, again noted was just a minimal amount of distal flow past the occlusion site. A third pass was made again using just an 021 160 cm microcatheter which was advanced to the site of the occlusion of the angular branch over a 0.014 inch standard Synchro micro guidewire without difficulty. Multiple attempts were  made to advance the micro guidewire gently through the occlusive site without success. The microcatheter was removed. Control arteriogram performed through the balloon catheter in the left internal carotid artery continued to demonstrate wide patency of the left middle cerebral artery territory superior divisions and the anterior parietal branch of the inferior division. No significant change was noted in the occluded angular branch in the distal left M2 M3 region. Throughout the procedure, the patient's blood pressure and neurological status remained stable. No evidence of extravasation was noted. The balloon guide was then removed. The 8 French Pinnacle sheath was removed. Hemostasis was achieved with a 6 French Angio-Seal closure device, and a quick clot compression for about 20 minutes. A CT of the brain demonstrated no evidence of mass effect or midline shift. Contrast was noted in the left anterior perisylvian fissure, and also in the temporoparietal convexity area. Distal pulses remained Dopplerable in both feet unchanged. The patient was extubated. Upon recovery, the patient was able to move all fours spontaneously and to command. She did exhibit difficulty with comprehension. She was then transferred to the neuro ICU for post revascularization care. IMPRESSION: Status post attempted revascularization of occluded distal left MCA inferior division angular branch in the M2 M3 region with 1 pass with a 3 mm x 40 mm Solitaire X retrieval device and contact aspiration, 1 pass with contact aspiration and 1 pass with mechanical thrombolysis with a TICI 2C revascularization. PLAN: Follow-up in the clinic approximately 4 weeks post discharge. Electronically Signed   By: Julieanne Cotton M.D.   On: 02/28/2021 08:36   CT HEAD CODE STROKE WO CONTRAST  Result Date: 02/25/2021 CLINICAL DATA:  Code stroke. EXAM: CT HEAD WITHOUT CONTRAST TECHNIQUE: Contiguous axial images were  obtained from the base of the skull  through the vertex without intravenous contrast. COMPARISON:  None. FINDINGS: Brain: No acute intracranial hemorrhage, mass effect, or edema. Gray-white differentiation is preserved. Age-indeterminate small vessel infarct of the left gangliocapsular region. Additional patchy low-density in the supratentorial white matter is nonspecific but may reflect mild chronic microvascular ischemic changes. Ventricles and sulci are within normal limits in size and configuration. No extra-axial collection. Vascular: No hyperdense vessel. Mild intracranial atherosclerotic calcification at the skull base. Skull: Unremarkable. Sinuses/Orbits: Aerated. Other: Mastoid air cells are clear. ASPECTS ([place]berta Stroke Program Early CT Score) - Ganglionic level infarction (caudate, lentiform nuclei, internal capsule, insula, M1-M3 cortex): 7 - Supraganglionic infarction (M4-M6 cortex): 3 Total score (0-10 with 10 being normal): 10 IMPRESSION: There is no acute intracranial hemorrhage or evidence of acute infarction. ASPECT score is 10. Age-indeterminate small vessel infarct of the left gangliocapsular region. These results were called by telephone at the time of interpretation on 02/25/2021 at 2:37 pm to provider Tanda Rockers , who verbally acknowledged these results. Electronically Signed   By: Guadlupe Spanish M.D.   On: 02/25/2021 14:39   VAS [place] LOWER EXTREMITY VENOUS (DVT)  Result Date: 02/26/2021  Lower Venous DVT Study Patient Name:  DYLIN IHNEN  Date of Exam:   02/26/2021 Medical Rec #: [id number]      Accession #:    [id number] Date of Birth: 04/13/1946     Patient Gender: F Patient Age:   71 years Exam Location:  [facility] Procedure:      VAS [place] LOWER EXTREMITY VENOUS (DVT) Referring Phys: Scheryl Marten Talor [place] --------------------------------------------------------------------------------  Indications: Stroke.  Comparison Study: No prior study Performing Technologist: Gertie Fey MHA, RDMS, RVT, RDCS  Examination  Guidelines: A complete evaluation includes B-mode imaging, spectral Doppler, color Doppler, and power Doppler as needed of all accessible portions of each vessel. Bilateral testing is considered an integral part of a complete examination. Limited examinations for reoccurring indications may be performed as noted. The reflux portion of the exam is performed with the patient in reverse Trendelenburg.  +---------+---------------+---------+-----------+----------+--------------+ RIGHT    CompressibilityPhasicitySpontaneityPropertiesThrombus Aging +---------+---------------+---------+-----------+----------+--------------+ CFV      Full           Yes      Yes                                 +---------+---------------+---------+-----------+----------+--------------+ SFJ      Full                                                        +---------+---------------+---------+-----------+----------+--------------+ FV Prox  Full                                                        +---------+---------------+---------+-----------+----------+--------------+ FV Mid   Full                                                        +---------+---------------+---------+-----------+----------+--------------+  FV DistalFull                                                        +---------+---------------+---------+-----------+----------+--------------+ PFV      Full                                                        +---------+---------------+---------+-----------+----------+--------------+ POP      Full           Yes      Yes                                 +---------+---------------+---------+-----------+----------+--------------+ PTV      Full                                                        +---------+---------------+---------+-----------+----------+--------------+ PERO     Full                                                         +---------+---------------+---------+-----------+----------+--------------+   +---------+---------------+---------+-----------+----------+--------------+ LEFT     CompressibilityPhasicitySpontaneityPropertiesThrombus Aging +---------+---------------+---------+-----------+----------+--------------+ CFV      Full           Yes      Yes                                 +---------+---------------+---------+-----------+----------+--------------+ SFJ      Full                                                        +---------+---------------+---------+-----------+----------+--------------+ FV Prox  Full                                                        +---------+---------------+---------+-----------+----------+--------------+ FV Mid   Full                                                        +---------+---------------+---------+-----------+----------+--------------+ FV DistalFull                                                        +---------+---------------+---------+-----------+----------+--------------+  PFV      Full                                                        +---------+---------------+---------+-----------+----------+--------------+ POP      Full           Yes      Yes                                 +---------+---------------+---------+-----------+----------+--------------+ PTV      Full                                                        +---------+---------------+---------+-----------+----------+--------------+ PERO     Full                                                        +---------+---------------+---------+-----------+----------+--------------+     Summary: RIGHT: - There is no evidence of deep vein thrombosis in the lower extremity.  - No cystic structure found in the popliteal fossa.  LEFT: - There is no evidence of deep vein thrombosis in the lower extremity.  - No cystic structure found in the popliteal fossa.   *See table(s) above for measurements and observations. Electronically signed by Waverly Ferrari MD on 02/26/2021 at 4:59:49 PM.    Final    CT ANGIO HEAD NECK W WO CM W PERF (CODE STROKE)  Result Date: 02/25/2021 CLINICAL DATA:  Neuro deficit, acute, stroke suspected EXAM: CT ANGIOGRAPHY HEAD AND NECK CT PERFUSION BRAIN TECHNIQUE: Multidetector CT imaging of the head and neck was performed using the standard protocol during bolus administration of intravenous contrast. Multiplanar CT image reconstructions and MIPs were obtained to evaluate the vascular anatomy. Carotid stenosis measurements (when applicable) are obtained utilizing NASCET criteria, using the distal internal carotid diameter as the denominator. Multiphase CT imaging of the brain was performed following IV bolus contrast injection. Subsequent parametric perfusion maps were calculated using RAPID software. CONTRAST:  OMNIPAQUE IOHEXOL 350 MG/ML SOLN COMPARISON:  None. FINDINGS: CTA NECK Aortic arch: Trace calcified plaque along the included arch. Great vessel origins are patent. Right carotid system: Patent.  No stenosis. Left carotid system: Patent. Trace mixed plaque at the bifurcation. No stenosis. Vertebral arteries: Patent and codominant.  No stenosis. Skeleton: Mild cervical spine degenerative changes. Degenerative changes at the temporomandibular joints. Other neck: Unremarkable. Upper chest: Centrilobular emphysema. Nodular but somewhat tubular opacities at the left apex probably reflect impacted airways. Review of the MIP images confirms the above findings CTA HEAD Anterior circulation: Intracranial internal carotid arteries are patent with mild calcified plaque. Left M1 MCA is patent. There is occlusion of a distal left M2 MCA branch within the posterior sylvian fissure. This may reconstitute more distally. Anterior and right middle cerebral arteries are patent. Posterior circulation: Intracranial vertebral arteries are patent.  Basilar artery is patent. Major cerebellar artery origins are patent. A left posterior communicating artery is  present. Posterior cerebral arteries are patent. Venous sinuses: Patent as allowed by contrast bolus timing. Review of the MIP images confirms the above findings CT Brain Perfusion Findings: CBF (<30%) Volume: 108mL Perfusion (Tmax>6.0s) volume: 79mL Mismatch Volume: 23mL Infarction Location: Area of infarction and most territory at risk within the posterior left MCA territory. There is some artifactual calculated territory at risk within the inferior frontal lobes. 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 May reconstitute more distally. Perfusion imaging demonstrates 3 mL of core infarction and 18 mL of penumbra within the posterior left MCA territory. The penumbra volume is overestimated as there is some artifact in the inferior frontal lobes included in the number. No occlusion or hemodynamically significant stenosis in the neck. These results were provided by telephone at the time of interpretation on 02/25/2021 at 3:55 pm to provider [facility] , who verbally acknowledged these results. Electronically Signed   By: Guadlupe Spanish M.D.   On: 02/25/2021 16:01     PHYSICAL EXAM  Temp:  [97.6 F (36.4 C)-98.4 F (36.9 C)] 98.2 F (36.8 C) (10/20 0754) Pulse Rate:  [62-138] 71 (10/20 1000) Resp:  [15-31] 30 (10/20 1000) BP: (102-133)/(53-92) 124/89 (10/20 1000) SpO2:  [80 %-100 %] 99 % (10/20 1000)  General - Well nourished, well developed, in no apparent distress.  Ophthalmologic - fundi not visualized due to noncooperation.  Cardiovascular - irregularly irregular heart rate and rhythm, with RVR  Neuro - awake, alert, global aphasia, not following commands, able to tell me her first name and say "I am OK, thank you very much", not able to name and repeat. No gaze palsy, tracking bilaterally, not consistently blinking to visual threat bilaterally,  PERRL. Slight right nasolabial fold flatterning. Tongue protrusion not cooperative. Bilateral UEs 5/5, no drift. Bilaterally LEs 5/5, no drift. Sensation, coordination not cooperative and gait not tested.    ASSESSMENT/PLAN Ms. Yarexi Pawlicki is a 74 y.o. female with history of HTN, HLD admitted for aphasia. 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  MRI large inferior MCA infarct with petechial hemorrhage 2D Echo EF 55-60% LE venous doppler no DVT LDL 63 HgbA1c 5.5 UDS neg SCDs for VTE prophylaxis No antithrombotic prior to admission, now on heparin IV. Will consider DOAC if no procedure planned.  Ongoing aggressive stroke risk factor management Therapy recommendations:  CIR Disposition:  pending  Afib RVR Developed afib RVR overnight New diagnosis On cardizem po 60mg  q6 Still on amiodarone drip on heparin IV. Will consider DOAC if no procedure planned.  CCM and card on board  Hypertension Stable Off cleviprex On cardizem BP goal < 160 given on heparin IV with petechial hemorrhage Long term BP goal normotensive  Hyperlipidemia Home meds:  lipitor 10  LDL 63, goal < 70 Now on lipitor 10 Continue statin at discharge  Other Stroke Risk Factors Advanced age Obesity, Body mass index is 31.15 kg/m.   Other Active Problems Leukocytosis WBC 12.2  Hospital day # 3  This patient is critically ill due to large left MCA stroke status post TNK NIR, A. fib RVR, new diagnosis, and at significant risk of neurological worsening, death form  recurrent stroke, hemorrhagic conversion, heart failure, bleeding from anticoagulation. This patient's care requires  constant monitoring of vital signs, hemodynamics, respiratory and cardiac monitoring, review of multiple databases, neurological assessment, discussion with family, other specialists and medical decision making of high complexity. I spent 35 minutes of neurocritical care time in the care of this patient.  I have discussed with Dr. Denese Killings and also did a peer to peer review with insurance for CIR authorization.  Marvel Plan, MD PhD Stroke Neurology 02/28/2021 11:49 AM    To contact Stroke Continuity provider, please refer to [email or web address]. After hours, contact General Neurology

## 2021-02-28 NOTE — Progress Notes (Signed)
Occupational Therapy Treatment Patient Details Name: Michelle Dean MRN: [id number] DOB: 05-02-1946 Today's Date: 02/28/2021   History of present illness 75 yo female presents to [facility] on 10/17 with aphasia. Imaging reveals Left MCA distal M2 stroke, currently embolic of undetermined etiology, transferred to [facility] for intervention. s/p L common carotid arteriogram via R CFA, partial recanalization with reocclusion with x 1 pass with 40 mm solitaireX retriever and contact aspiration,x1 pass with contact aspiration and x1 pass with mechanical thrombolysis  acieving a TICI 2C revascularization on 10/17. PMH includes COPD (intermittently uses oxygen at home), hypertension, hyperlipidemia.   OT comments  Making excellent progress. Goals upgraded to modified independent level. Ambulated to toilet with minguard A and completed bathing with min A for LB. Automatic fluid speech at times; appropriate during session when completing functional tasks. Max HR 138. Would benefit from rehab at [facility] however if pt continues to improve she may be appropriate to DC to familiar home environment if she has 24/7 Sand assistance with IADL tasks.  Will continue to follow acutely.    Recommendations for follow up therapy are one component of a multi-disciplinary discharge planning process, led by the attending physician.  Recommendations may be updated based on patient status, additional functional criteria and insurance authorization.    Follow Up Recommendations  CIR    Equipment Recommendations  3 in 1 bedside commode;Other (comment)    Recommendations for Other Services Rehab consult    Precautions / Restrictions Precautions Precautions: Fall Precaution Comments: aphasic Restrictions Weight Bearing Restrictions: No       Mobility Bed Mobility Overal bed mobility: Needs Assistance Bed Mobility: Supine to Sit     Supine to sit: Supervision          Transfers Overall transfer level: Needs  assistance Equipment used: Rolling walker (2 wheeled) Transfers: Sit to/from Stand Sit to Stand: Min guard              Balance Overall balance assessment: Needs assistance   Sitting balance-Leahy Scale: Good       Standing balance-Leahy Scale: Fair                             ADL either performed or assessed with clinical judgement   ADL Overall ADL's : Needs assistance/impaired Eating/Feeding: Set up   Grooming: Set up;Sitting   Upper Body Bathing: Set up;Supervision/ safety;Sitting   Lower Body Bathing: Sit to/from stand;Minimal assistance   Upper Body Dressing : Set up;Supervision/safety   Lower Body Dressing: Minimal assistance;Sit to/from stand   Toilet Transfer: Min guard;Ambulation;RW;Grab bars   Toileting- Clothing Manipulation and Hygiene: Min guard       Functional mobility during ADLs: Minimal assistance;Rolling walker;Cueing for safety General ADL Comments: ver appropriate during session and     Vision   Vision Assessment?: Yes Additional Comments: will further assess   Perception     Praxis      Cognition Arousal/Alertness: Awake/alert Behavior During Therapy: WFL for tasks assessed/performed Overall Cognitive Status: Difficult to assess                                          Exercises     Shoulder Instructions       General Comments Max HR 138; BP stable    Pertinent Vitals/ Pain       Pain  Assessment: Faces Faces Pain Scale: No hurt  Home Living                                          Prior Functioning/Environment              Frequency  Min 2X/week        Progress Toward Goals  OT Goals(current goals can now be found in the care plan section)  Progress towards OT goals: Progressing toward goals  Acute Rehab OT Goals Patient Stated Goal: return home OT Goal Formulation: With patient/family Time For Goal Achievement: 03/12/21 Potential to Achieve Goals:  Good ADL Goals Pt Will Perform Grooming: with modified independence;sitting Pt Will Perform Upper Body Bathing: with modified independence;sitting Pt Will Perform Lower Body Bathing: with modified independence;sit to/from stand Pt Will Perform Lower Body Dressing: with modified independence;sit to/from stand Pt Will Transfer to Toilet: with modified independence;ambulating  Plan Discharge plan remains appropriate    Co-evaluation                 AM-PAC OT "6 Clicks" Daily Activity     Outcome Measure   Help from another person eating meals?: A Little Help from another person taking care of personal grooming?: A Little Help from another person toileting, which includes using toliet, bedpan, or urinal?: A Little Help from another person bathing (including washing, rinsing, drying)?: A Little Help from another person to put on and taking off regular upper body clothing?: A Little Help from another person to put on and taking off regular lower body clothing?: A Little 6 Click Score: 18    End of Session Equipment Utilized During Treatment: Gait belt;Rolling walker;Oxygen (3L at baseline)  OT Visit Diagnosis: Unsteadiness on feet (R26.81);Muscle weakness (generalized) (M62.81);Other symptoms and signs involving the nervous system (R29.898)   Activity Tolerance Patient tolerated treatment well   Patient Left in chair;with call bell/phone within reach;with chair alarm set   Nurse Communication Mobility status        Time: 7591-6384 OT Time Calculation (min): 29 min  Charges: OT General Charges $OT Visit: 1 Visit OT Treatments $Self Care/Home Management : 23-37 mins  Luisa Dago, OT/L   Acute OT Clinical Specialist Acute Rehabilitation Services Pager [phone] Office [phone]   [facility] 02/28/2021, 9:58 AM

## 2021-02-28 NOTE — Progress Notes (Signed)
eLink Physician-Brief Progress Note Patient Name: Patrcia Schnepp DOB: 06/26/1945 MRN: [id number]   Date of Service  02/28/2021  HPI/Events of Note  mag 1.6, potassium 3.8, cret 0.80  eICU Interventions  Mag and K replaced     Intervention Category Minor Interventions: Electrolytes abnormality - evaluation and management  Jacinta Shoe 02/28/2021, 6:35 AM

## 2021-02-28 NOTE — H&P (View-Only) (Signed)
Progress Note  Patient Name: Michelle Dean Date of Encounter: 02/28/2021  Attending physician: Stroke, Md, MD Primary care provider: System, Provider Not In Primary Cardiologist: NA Consultant:Berle Fitz, DO  Subjective: Michelle Dean is a 74 y.o. female who was seen and examined at bedside No events overnight. Ventricular rate has improved over the last 24 hours with IV Cardizem and amiodarone. With ambulation the ventricular rate trends up. As tolerated IV heparin drip Per nursing staff continues to be aphasic given the recent stroke but at times does show signs of improvement compared to the day prior Case discussed and reviewed with her nurse.  Objective: Vital Signs in the last 24 hours: Temp:  [97.6 F (36.4 C)-98.3 F (36.8 C)] 98.3 F (36.8 C) (10/20 2000) Pulse Rate:  [62-136] 93 (10/20 2200) Resp:  [15-30] 25 (10/20 2200) BP: (102-139)/(53-124) 139/124 (10/20 2200) SpO2:  [94 %-100 %] 98 % (10/20 2201)  Intake/Output:  Intake/Output Summary (Last 24 hours) at 02/28/2021 2228 Last data filed at 02/28/2021 2200 Gross per 24 hour  Intake 1337.89 ml  Output 950 ml  Net 387.89 ml    Net IO Since Admission: 2,801.11 mL [02/28/21 2228]  Weights:  Filed Weights   02/25/21 1452 02/25/21 1514  Weight: 99.8 kg 90.2 kg    Telemetry: Personally reviewed.  Rate controlled atrial fibrillation/flutter  Physical examination: PHYSICAL EXAM: Vitals with BMI 02/28/2021 02/28/2021 02/28/2021  Height - - -  Weight - - -  BMI - - -  Systolic 139 131 127  Diastolic 124 71 67  Pulse 93 83 79    CONSTITUTIONAL: Resting in bed comfortably, no acute distress, appears older than stated age. SKIN: Skin is warm and dry. No rash noted. No cyanosis. No pallor. No jaundice HEAD: Normocephalic and atraumatic.  EYES: No scleral icterus MOUTH/THROAT: Moist oral membranes.  NECK: No JVD present. No thyromegaly noted. No carotid bruits  LYMPHATIC: No visible cervical adenopathy.   CHEST Normal respiratory effort. No intercostal retractions  LUNGS: Clear to auscultation bilaterally. no stridor. No wheezes. No rales.  CARDIOVASCULAR:  Irregularly irregular, variable S1-S2, no murmurs rubs or gallops appreciated  ABDOMINAL: Soft, nontender, nondistended, positive bowel sounds in all 4 quadrants, no apparent ascites.  EXTREMITIES: No peripheral edema  HEMATOLOGIC: No significant bruising NEUROLOGIC: Moving all 4 extremities, strength at least 3+, both receptive and expressive aphasia.   PSYCHIATRIC: Normal mood and affect. Normal behavior. Cooperative  Lab Results: Hematology Recent Labs  Lab 02/25/21 1441 02/26/21 0525 02/28/21 0356  WBC 9.9 12.2* 12.2*  RBC 4.55 4.11 3.91  HGB 13.6 12.4 11.8*  HCT 43.6 38.6 37.9  MCV 95.8 93.9 96.9  MCH 29.9 30.2 30.2  MCHC 31.2 32.1 31.1  RDW 13.8 13.8 14.4  PLT 225 222 228    Chemistry Recent Labs  Lab 02/25/21 1441 02/26/21 0525 02/28/21 0356  NA 141 142 142  K 3.4* 3.6 3.8  CL 107 109 109  CO2 26 25 24  GLUCOSE 113* 142* 113*  BUN 21 10 10  CREATININE 0.89 0.75 0.80  CALCIUM 8.8* 8.4* 8.8*  PROT 6.8  --   --   ALBUMIN 3.9  --   --   AST 20  --   --   ALT 14  --   --   ALKPHOS 56  --   --   BILITOT 0.6  --   --   GFRNONAA >60 >60 >60  ANIONGAP 8 8 9     Cardiac Enzymes: Cardiac Panel (last   3 results) No results for input(s): CKTOTAL, CKMB, TROPONINIHS, RELINDX in the last 72 hours.  BNP (last 3 results) No results for input(s): BNP in the last 8760 hours.  ProBNP (last 3 results) No results for input(s): PROBNP in the last 8760 hours.   DDimer No results for input(s): DDIMER in the last 168 hours.   Hemoglobin A1c:  Lab Results  Component Value Date   HGBA1C 5.5 02/26/2021   MPG 111.15 02/26/2021    TSH  Recent Labs    02/27/21 1211  TSH 0.633    Lipid Panel     Component Value Date/Time   CHOL 131 02/26/2021 0525   TRIG 55 02/26/2021 0525   HDL 57 02/26/2021 0525   CHOLHDL  2.3 02/26/2021 0525   VLDL 11 02/26/2021 0525   LDLCALC 63 02/26/2021 0525    Imaging: No results found.  Cardiac database: EKG: 02/27/2021: Atrial flutter 2:1, 130bpm, right bundle branch block.  No prior EKGs available for comparison   Echocardiogram: 02/26/2021: 1. Left ventricular ejection fraction, by estimation, is 55 to 60%. The  left ventricle has normal function. The left ventricle has no regional  wall motion abnormalities. Left ventricular diastolic parameters are  consistent with Grade II diastolic dysfunction (pseudonormalization).   2. Right ventricular systolic function is normal. The right ventricular  size is mildly enlarged. Tricuspid regurgitation signal is inadequate for  assessing PA pressure.   3. Left atrial size was mildly dilated.   4. Right atrial size was mildly dilated.   5. The mitral valve is normal in structure. No evidence of mitral valve regurgitation. No evidence of mitral stenosis.   6. The aortic valve is tricuspid. Aortic valve regurgitation is not visualized. Mild aortic valve sclerosis is present, with no evidence of aortic valve stenosis.   7. The inferior vena cava is dilated in size with >50% respiratory variability, suggesting right atrial pressure of 8 mmHg.   Scheduled Meds:  atorvastatin  10 mg Oral Daily   Chlorhexidine Gluconate Cloth  6 each Topical Daily   diltiazem  60 mg Oral Q6H   magnesium oxide  400 mg Oral Daily   metoprolol tartrate  25 mg Oral BID   mometasone-formoterol  2 puff Inhalation BID   multivitamin with minerals  1 tablet Oral Daily   umeclidinium bromide  1 puff Inhalation Daily    Continuous Infusions:  amiodarone 30 mg/hr (02/28/21 2200)   heparin 1,450 Units/hr (02/28/21 2200)    PRN Meds: acetaminophen **OR** acetaminophen (TYLENOL) oral liquid 160 mg/5 mL **OR** acetaminophen, albuterol, labetalol **AND** [DISCONTINUED] clevidipine, senna-docusate   IMPRESSION & RECOMMENDATIONS: Michelle Dean is a  75 y.o. female whose past medical history and cardiac risk factors include: Recent stroke (M2/M3 occlusion) s/p TNKase followed by partial recannulation with reocclusion followed by mechanical thrombolysis achieving TICI 2C revascularization, HTN, HLD, COPD on intermittent oxygen support, postmenopausal female, advanced age.  Atrial flutter with rapid ventricular rate: Initially noted to be Afib; however, after being on amiodarone the rhythm has been more organized and appears to be atrial flutter with variable conduction. Ventricular rate has improved for the last 24 hours; however, with ambulation with PT/OT her ventricular rate is not well controlled per nursing staff. Will uptitrate oral calcium channel blockers today to better improve her ventricular rate and wean off Cardizem drip.  Agree w/ reducing the dose of IV amiodarone per CCM.  Do not anticipate long term use of amiodarone if possible, will transition it to oral in am.    If her ventricular rate is not well controlled we will proceed with TEE cardioversion - tomorrow will discuss with son in the morning based on her clinical course.  Tolerating IV heparin well. Recommendations conveyed to Dr. Denese Killings.   Left MCA Stroke:  Management per neurology.   Hypertension:  Well controlled.  Management per primary and CCM teams.   Hyperlipidemia:  LDL is less than 70mg /dL Continue statin therapy. Most of his recent lipid profile reviewed.  Continue your care with regards to the management of her other chronic co-morbid conditions and active problem list..   Patient's questions and concerns were addressed to her satisfaction. She voices understanding of the instructions provided during this encounter.   This note was created using a voice recognition software as a result there may be grammatical errors inadvertently enclosed that do not reflect the nature of this encounter. Every attempt is made to correct such errors.  ,  DO, [facility] Office: [phone] 02/28/2021, 10:28 PM

## 2021-02-28 NOTE — Progress Notes (Signed)
ANTICOAGULATION CONSULT NOTE  Pharmacy Consult for heparin Indication: atrial fibrillation  Allergies  Allergen Reactions   Aspirin     Other reaction(s): Bleeding   Penicillins Hives and Rash   Lisinopril Cough    Patient Measurements: Height: 5\' 7"  (170.2 cm) Weight: 90.2 kg (198 lb 14.4 oz) IBW/kg (Calculated) : 61.6  Vital Signs: Temp: 98.2 F (36.8 C) (10/20 0754) Temp Source: Oral (10/20 0754) BP: 111/69 (10/20 1400) Pulse Rate: 118 (10/20 1400)  Labs: Recent Labs    02/26/21 0525 02/27/21 1738 02/28/21 0356 02/28/21 1350  HGB 12.4  --  11.8*  --   HCT 38.6  --  37.9  --   PLT 222  --  228  --   HEPARINUNFRC  --  0.17* 0.55 0.48  CREATININE 0.75  --  0.80  --      Estimated Creatinine Clearance: 70 mL/min (by C-G formula based on SCr of 0.8 mg/dL).   Medical History: History reviewed. No pertinent past medical history.  Medications:  Medications Prior to Admission  Medication Sig Dispense Refill Last Dose   albuterol (PROVENTIL) (2.5 MG/3ML) 0.083% nebulizer solution Take 2.5 mg by nebulization every 4 (four) hours as needed for wheezing.   unknown   atorvastatin (LIPITOR) 10 MG tablet Take 10 mg by mouth daily.   unknown   carboxymethylcellulose (REFRESH PLUS) 0.5 % SOLN Place 1 drop into both eyes 4 (four) times daily as needed (dry eyes).   unknown   DULERA 100-5 MCG/ACT AERO Inhale 2 puffs into the lungs 2 (two) times daily.   unknown   losartan (COZAAR) 25 MG tablet Take 25 mg by mouth daily.   unknown   Multiple Vitamin (MULTIVITAMIN) tablet Take 1 tablet by mouth daily.   unknown   omeprazole (PRILOSEC) 20 MG capsule Take 20 mg by mouth daily.   unknown   tiotropium (SPIRIVA HANDIHALER) 18 MCG inhalation capsule Place 18 mcg into inhaler and inhale daily.   unknown    Assessment: 70 YOF who presents with L MCA stroke s/p tenecteplase developed new onset Afib RVR. Pharmacy consulted to start IV heparin per stroke protocol.   Heparin level this  afternoon therapeutic at 0.48.  Goal of Therapy:  Heparin level 0.3-0.5 units/ml Monitor platelets by anticoagulation protocol: Yes   Plan:  -Continue heparin 1450 units/h -Repeat heparin level with am labs  61, PharmD, BCPS, [facility] Clinical Pharmacist [phone] Please check AMION for all [facility] Pharmacy numbers 02/28/2021

## 2021-02-28 NOTE — Progress Notes (Addendum)
SBP low 100s, HR 102. Patient due for increased dose of Cardizem 60mg  at this time. Cardiology made aware of SBP. Give one time dose of Cardizem 30mg  PO at this time. Hold 60mg  dose.  Neuro status unchanged.

## 2021-02-28 NOTE — Progress Notes (Signed)
Progress Note  Patient Name: Michelle Dean Date of Encounter: 02/28/2021  Attending physician: Stroke, Md, MD Primary care provider: System, Provider Not In Primary Cardiologist: NA Consultant:Keiara Sneeringer Odis Hollingshead, DO  Subjective: Michelle Dean is a 75 y.o. female who was seen and examined at bedside No events overnight. Ventricular rate has improved over the last 24 hours with IV Cardizem and amiodarone. With ambulation the ventricular rate trends up. As tolerated IV heparin drip Per nursing staff continues to be aphasic given the recent stroke but at times does show signs of improvement compared to the day prior Case discussed and reviewed with her nurse.  Objective: Vital Signs in the last 24 hours: Temp:  [97.6 F (36.4 C)-98.3 F (36.8 C)] 98.3 F (36.8 C) (10/20 2000) Pulse Rate:  [62-136] 93 (10/20 2200) Resp:  [15-30] 25 (10/20 2200) BP: (102-139)/(53-124) 139/124 (10/20 2200) SpO2:  [94 %-100 %] 98 % (10/20 2201)  Intake/Output:  Intake/Output Summary (Last 24 hours) at 02/28/2021 2228 Last data filed at 02/28/2021 2200 Gross per 24 hour  Intake 1337.89 ml  Output 950 ml  Net 387.89 ml    Net IO Since Admission: 2,801.11 mL [02/28/21 2228]  Weights:  Filed Weights   02/25/21 1452 02/25/21 1514  Weight: 99.8 kg 90.2 kg    Telemetry: Personally reviewed.  Rate controlled atrial fibrillation/flutter  Physical examination: PHYSICAL EXAM: Vitals with BMI 02/28/2021 02/28/2021 02/28/2021  Height - - -  Weight - - -  BMI - - -  Systolic 139 131 867  Diastolic 124 71 67  Pulse 93 83 79    CONSTITUTIONAL: Resting in bed comfortably, no acute distress, appears older than stated age. SKIN: Skin is warm and dry. No rash noted. No cyanosis. No pallor. No jaundice HEAD: Normocephalic and atraumatic.  EYES: No scleral icterus MOUTH/THROAT: Moist oral membranes.  NECK: No JVD present. No thyromegaly noted. No carotid bruits  LYMPHATIC: No visible cervical adenopathy.   CHEST Normal respiratory effort. No intercostal retractions  LUNGS: Clear to auscultation bilaterally. no stridor. No wheezes. No rales.  CARDIOVASCULAR:  Irregularly irregular, variable S1-S2, no murmurs rubs or gallops appreciated  ABDOMINAL: Soft, nontender, nondistended, positive bowel sounds in all 4 quadrants, no apparent ascites.  EXTREMITIES: No peripheral edema  HEMATOLOGIC: No significant bruising NEUROLOGIC: Moving all 4 extremities, strength at least 3+, both receptive and expressive aphasia.   PSYCHIATRIC: Normal mood and affect. Normal behavior. Cooperative  Lab Results: Hematology Recent Labs  Lab 02/25/21 1441 02/26/21 0525 02/28/21 0356  WBC 9.9 12.2* 12.2*  RBC 4.55 4.11 3.91  HGB 13.6 12.4 11.8*  HCT 43.6 38.6 37.9  MCV 95.8 93.9 96.9  MCH 29.9 30.2 30.2  MCHC 31.2 32.1 31.1  RDW 13.8 13.8 14.4  PLT 225 222 228    Chemistry Recent Labs  Lab 02/25/21 1441 02/26/21 0525 02/28/21 0356  NA 141 142 142  K 3.4* 3.6 3.8  CL 107 109 109  CO2 26 25 24   GLUCOSE 113* 142* 113*  BUN 21 10 10   CREATININE 0.89 0.75 0.80  CALCIUM 8.8* 8.4* 8.8*  PROT 6.8  --   --   ALBUMIN 3.9  --   --   AST 20  --   --   ALT 14  --   --   ALKPHOS 56  --   --   BILITOT 0.6  --   --   GFRNONAA >60 >60 >60  ANIONGAP 8 8 9      Cardiac Enzymes: Cardiac Panel (last  3 results) No results for input(s): CKTOTAL, CKMB, TROPONINIHS, RELINDX in the last 72 hours.  BNP (last 3 results) No results for input(s): BNP in the last 8760 hours.  ProBNP (last 3 results) No results for input(s): PROBNP in the last 8760 hours.   DDimer No results for input(s): DDIMER in the last 168 hours.   Hemoglobin A1c:  Lab Results  Component Value Date   HGBA1C 5.5 02/26/2021   MPG 111.15 02/26/2021    TSH  Recent Labs    02/27/21 1211  TSH 0.633    Lipid Panel     Component Value Date/Time   CHOL 131 02/26/2021 0525   TRIG 55 02/26/2021 0525   HDL 57 02/26/2021 0525   CHOLHDL  2.3 02/26/2021 0525   VLDL 11 02/26/2021 0525   LDLCALC 63 02/26/2021 0525    Imaging: No results found.  Cardiac database: EKG: 02/27/2021: Atrial flutter 2:1, 130bpm, right bundle branch block.  No prior EKGs available for comparison   Echocardiogram: 02/26/2021: 1. Left ventricular ejection fraction, by estimation, is 55 to 60%. The  left ventricle has normal function. The left ventricle has no regional  wall motion abnormalities. Left ventricular diastolic parameters are  consistent with Grade II diastolic dysfunction (pseudonormalization).   2. Right ventricular systolic function is normal. The right ventricular  size is mildly enlarged. Tricuspid regurgitation signal is inadequate for  assessing PA pressure.   3. Left atrial size was mildly dilated.   4. Right atrial size was mildly dilated.   5. The mitral valve is normal in structure. No evidence of mitral valve regurgitation. No evidence of mitral stenosis.   6. The aortic valve is tricuspid. Aortic valve regurgitation is not visualized. Mild aortic valve sclerosis is present, with no evidence of aortic valve stenosis.   7. The inferior vena cava is dilated in size with >50% respiratory variability, suggesting right atrial pressure of 8 mmHg.   Scheduled Meds:  atorvastatin  10 mg Oral Daily   Chlorhexidine Gluconate Cloth  6 each Topical Daily   diltiazem  60 mg Oral Q6H   magnesium oxide  400 mg Oral Daily   metoprolol tartrate  25 mg Oral BID   mometasone-formoterol  2 puff Inhalation BID   multivitamin with minerals  1 tablet Oral Daily   umeclidinium bromide  1 puff Inhalation Daily    Continuous Infusions:  amiodarone 30 mg/hr (02/28/21 2200)   heparin 1,450 Units/hr (02/28/21 2200)    PRN Meds: acetaminophen **OR** acetaminophen (TYLENOL) oral liquid 160 mg/5 mL **OR** acetaminophen, albuterol, labetalol **AND** [DISCONTINUED] clevidipine, senna-docusate   IMPRESSION & RECOMMENDATIONS: Latrenda Irani is a  75 y.o. female whose past medical history and cardiac risk factors include: Recent stroke (M2/M3 occlusion) s/p TNKase followed by partial recannulation with reocclusion followed by mechanical thrombolysis achieving TICI 2C revascularization, HTN, HLD, COPD on intermittent oxygen support, postmenopausal female, advanced age.  Atrial flutter with rapid ventricular rate: Initially noted to be Afib; however, after being on amiodarone the rhythm has been more organized and appears to be atrial flutter with variable conduction. Ventricular rate has improved for the last 24 hours; however, with ambulation with PT/OT her ventricular rate is not well controlled per nursing staff. Will uptitrate oral calcium channel blockers today to better improve her ventricular rate and wean off Cardizem drip.  Agree w/ reducing the dose of IV amiodarone per CCM.  Do not anticipate long term use of amiodarone if possible, will transition it to oral in am.  If her ventricular rate is not well controlled we will proceed with TEE cardioversion - tomorrow will discuss with son in the morning based on her clinical course.  Tolerating IV heparin well. Recommendations conveyed to Dr. Denese Killings.   Left MCA Stroke:  Management per neurology.   Hypertension:  Well controlled.  Management per primary and CCM teams.   Hyperlipidemia:  LDL is less than 70mg /dL Continue statin therapy. Most of his recent lipid profile reviewed.  Continue your care with regards to the management of her other chronic co-morbid conditions and active problem list..   Patient's questions and concerns were addressed to her satisfaction. She voices understanding of the instructions provided during this encounter.   This note was created using a voice recognition software as a result there may be grammatical errors inadvertently enclosed that do not reflect the nature of this encounter. Every attempt is made to correct such errors.  ,  DO, [facility] Office: [phone] 02/28/2021, 10:28 PM

## 2021-02-28 NOTE — Progress Notes (Signed)
Inpatient Rehabilitation Admissions Coordinator   [facility] MD will not approve CIR admit at this time due ongoing medial issues. I can resubmit for Auth once cardiology and Neurology feel medically ready for CIR admit. I will follow for timing to resubmit.  Ottie Glazier, RN, MSN Rehab Admissions Coordinator [phone] 02/28/2021 2:15 PM

## 2021-02-28 NOTE — Progress Notes (Signed)
ANTICOAGULATION CONSULT NOTE  Pharmacy Consult for heparin Indication: atrial fibrillation  Allergies  Allergen Reactions   Aspirin     Other reaction(s): Bleeding   Penicillins Hives and Rash   Lisinopril Cough    Patient Measurements: Height: 5\' 7"  (170.2 cm) Weight: 90.2 kg (198 lb 14.4 oz) IBW/kg (Calculated) : 61.6  Vital Signs: Temp: 97.9 F (36.6 C) (10/20 0400) Temp Source: Oral (10/20 0400) BP: 112/71 (10/20 0500) Pulse Rate: 62 (10/20 0500)  Labs: Recent Labs    02/25/21 1441 02/26/21 0525 02/27/21 1738 02/28/21 0356  HGB 13.6 12.4  --  11.8*  HCT 43.6 38.6  --  37.9  PLT 225 222  --  228  APTT 23*  --   --   --   LABPROT 13.0  --   --   --   INR 1.0  --   --   --   HEPARINUNFRC  --   --  0.17* 0.55  CREATININE 0.89 0.75  --  0.80     Estimated Creatinine Clearance: 70 mL/min (by C-G formula based on SCr of 0.8 mg/dL).   Medical History: History reviewed. No pertinent past medical history.  Medications:  Medications Prior to Admission  Medication Sig Dispense Refill Last Dose   albuterol (PROVENTIL) (2.5 MG/3ML) 0.083% nebulizer solution Take 2.5 mg by nebulization every 4 (four) hours as needed for wheezing.   unknown   atorvastatin (LIPITOR) 10 MG tablet Take 10 mg by mouth daily.   unknown   carboxymethylcellulose (REFRESH PLUS) 0.5 % SOLN Place 1 drop into both eyes 4 (four) times daily as needed (dry eyes).   unknown   DULERA 100-5 MCG/ACT AERO Inhale 2 puffs into the lungs 2 (two) times daily.   unknown   losartan (COZAAR) 25 MG tablet Take 25 mg by mouth daily.   unknown   Multiple Vitamin (MULTIVITAMIN) tablet Take 1 tablet by mouth daily.   unknown   omeprazole (PRILOSEC) 20 MG capsule Take 20 mg by mouth daily.   unknown   tiotropium (SPIRIVA HANDIHALER) 18 MCG inhalation capsule Place 18 mcg into inhaler and inhale daily.   unknown    Assessment: 11 YOF who presents with L MCA stroke s/p tenecteplase developed new onset Afib RVR.  Pharmacy consulted to start IV heparin per stroke protocol.   Initial heparin level below goal at 0.17.  10/20 AM update: Heparin level just above goal  Goal of Therapy:  Heparin level 0.3-0.5 units/ml Monitor platelets by anticoagulation protocol: Yes   Plan:  -Dec heparin to 1450 units/hr -1400 heparin level  11/20, PharmD, BCPS Clinical Pharmacist Phone: [phone]

## 2021-03-01 ENCOUNTER — Inpatient Hospital Stay (HOSPITAL_COMMUNITY): Payer: Medicare Other

## 2021-03-01 ENCOUNTER — Inpatient Hospital Stay (HOSPITAL_COMMUNITY): Payer: Medicare Other | Admitting: Certified Registered Nurse Anesthetist

## 2021-03-01 ENCOUNTER — Encounter (HOSPITAL_COMMUNITY): Payer: Self-pay | Admitting: Neurology

## 2021-03-01 ENCOUNTER — Encounter (HOSPITAL_COMMUNITY)
Admission: EM | Disposition: A | Discharge status: Rehabilitation Facility | Payer: Self-pay | Source: Home / Self Care | Attending: Neurology

## 2021-03-01 DIAGNOSIS — I6602 Occlusion and stenosis of left middle cerebral artery: Secondary | ICD-10-CM | POA: Diagnosis not present

## 2021-03-01 DIAGNOSIS — E782 Mixed hyperlipidemia: Secondary | ICD-10-CM | POA: Diagnosis not present

## 2021-03-01 DIAGNOSIS — I480 Paroxysmal atrial fibrillation: Secondary | ICD-10-CM | POA: Diagnosis not present

## 2021-03-01 HISTORY — PX: CARDIOVERSION: SHX1299

## 2021-03-01 HISTORY — PX: BUBBLE STUDY: SHX6837

## 2021-03-01 HISTORY — PX: TEE WITHOUT CARDIOVERSION: SHX5443

## 2021-03-01 LAB — CBC
HCT: 34.1 % — ABNORMAL LOW (ref 36.0–46.0)
Hemoglobin: 10.7 g/dL — ABNORMAL LOW (ref 12.0–15.0)
MCH: 30 pg (ref 26.0–34.0)
MCHC: 31.4 g/dL (ref 30.0–36.0)
MCV: 95.5 fL (ref 80.0–100.0)
Platelets: 189 10*3/uL (ref 150–400)
RBC: 3.57 MIL/uL — ABNORMAL LOW (ref 3.87–5.11)
RDW: 14.1 % (ref 11.5–15.5)
WBC: 11.1 10*3/uL — ABNORMAL HIGH (ref 4.0–10.5)
nRBC: 0 % (ref 0.0–0.2)

## 2021-03-01 LAB — BASIC METABOLIC PANEL
Anion gap: 5 (ref 5–15)
BUN: 12 mg/dL (ref 8–23)
CO2: 28 mmol/L (ref 22–32)
Calcium: 8.6 mg/dL — ABNORMAL LOW (ref 8.9–10.3)
Chloride: 107 mmol/L (ref 98–111)
Creatinine, Ser: 0.83 mg/dL (ref 0.44–1.00)
GFR, Estimated: >60 mL/min (ref >60)
Glucose, Bld: 109 mg/dL — ABNORMAL HIGH (ref 70–99)
Potassium: 3.9 mmol/L (ref 3.5–5.1)
Sodium: 140 mmol/L (ref 135–145)

## 2021-03-01 LAB — PROTIME-INR
INR: 1.1 (ref 0.8–1.2)
Prothrombin Time: 14 seconds (ref 11.4–15.2)

## 2021-03-01 LAB — HEPARIN LEVEL (UNFRACTIONATED)
Heparin Unfractionated: 0.59 IU/mL (ref 0.30–0.70)
Heparin Unfractionated: 0.58 IU/mL (ref 0.30–0.70)

## 2021-03-01 SURGERY — ECHOCARDIOGRAM, TRANSESOPHAGEAL
Anesthesia: Monitor Anesthesia Care

## 2021-03-01 MED ORDER — METOPROLOL TARTRATE 5 MG/5ML IV SOLN
5.0000 mg | Freq: Once | INTRAVENOUS | Status: AC
  Administered 2021-03-01: 5 mg via INTRAVENOUS
  Filled 2021-03-01: qty 5

## 2021-03-01 MED ORDER — PROPOFOL 500 MG/50ML IV EMUL
INTRAVENOUS | Status: DC | PRN
  Administered 2021-03-01: 100 ug/kg/min via INTRAVENOUS

## 2021-03-01 MED ORDER — AMIODARONE HCL IN DEXTROSE 360-4.14 MG/200ML-% IV SOLN: 30.0000 mg/h | INTRAVENOUS | Status: DC

## 2021-03-01 MED ORDER — PROPOFOL 10 MG/ML IV BOLUS
INTRAVENOUS | Status: DC | PRN
  Administered 2021-03-01: 30 mg via INTRAVENOUS
  Administered 2021-03-01: 70 mg via INTRAVENOUS

## 2021-03-01 MED ORDER — LIDOCAINE 2% (20 MG/ML) 5 ML SYRINGE
INTRAMUSCULAR | Status: DC | PRN
  Administered 2021-03-01: 100 mg via INTRAVENOUS

## 2021-03-01 MED ORDER — SODIUM CHLORIDE 0.9 % IV SOLN: INTRAVENOUS | Status: DC

## 2021-03-01 MED ORDER — AMIODARONE LOAD VIA INFUSION
150.0000 mg | Freq: Once | INTRAVENOUS | Status: AC
  Administered 2021-03-01: 150 mg via INTRAVENOUS
  Filled 2021-03-01: qty 83.34

## 2021-03-01 MED ORDER — AMIODARONE HCL 200 MG PO TABS
200.0000 mg | ORAL_TABLET | Freq: Every day | ORAL | Status: DC
  Administered 2021-03-01: 200 mg via ORAL
  Filled 2021-03-01: qty 1

## 2021-03-01 MED ORDER — AMIODARONE HCL IN DEXTROSE 360-4.14 MG/200ML-% IV SOLN
60.0000 mg/h | INTRAVENOUS | Status: DC
  Administered 2021-03-01 – 2021-03-02 (×2): 60 mg/h via INTRAVENOUS
  Filled 2021-03-01 (×2): qty 200

## 2021-03-01 MED ORDER — PHENYLEPHRINE 40 MCG/ML (10ML) SYRINGE FOR IV PUSH (FOR BLOOD PRESSURE SUPPORT)
PREFILLED_SYRINGE | INTRAVENOUS | Status: DC | PRN
  Administered 2021-03-01: 160 ug via INTRAVENOUS
  Administered 2021-03-01: 80 ug via INTRAVENOUS

## 2021-03-01 NOTE — Transfer of Care (Signed)
Immediate Anesthesia Transfer of Care Note  Patient: Michelle Dean  Procedure(s) Performed: TRANSESOPHAGEAL ECHOCARDIOGRAM (TEE) CARDIOVERSION BUBBLE STUDY  Patient Location: Endoscopy Unit  Anesthesia Type:MAC  Level of Consciousness: awake and alert   Airway & Oxygen Therapy: Patient Spontanous Breathing and Patient connected to nasal cannula oxygen  Post-op Assessment: Report given to RN and Post -op Vital signs reviewed and stable  Post vital signs: Reviewed and stable  Last Vitals:  Vitals Value Taken Time  BP 175/132 03/01/21 1501  Temp 36.5 C 03/01/21 1501  Pulse 93 03/01/21 1505  Resp 27 03/01/21 1505  SpO2 89 % 03/01/21 1505  Vitals shown include unvalidated device data.  Last Pain:  Vitals:   03/01/21 1501  TempSrc: Tympanic  PainSc: 0-No pain         Complications: No notable events documented.

## 2021-03-01 NOTE — Progress Notes (Signed)
PT Cancellation Note  Patient Details Name: Michelle Dean MRN: [id number] DOB: August 06, 1945   Cancelled Treatment:    Reason Eval/Treat Not Completed: Patient at procedure or test/unavailable - will check back as schedule allows.  Marye Round, PT DPT Acute Rehabilitation Services Pager [phone]  Office [phone]    Truddie Coco 03/01/2021, 2:29 PM

## 2021-03-01 NOTE — CV Procedure (Signed)
Transesophageal echocardiogram (TEE) : Preliminary report 03/01/21  Sedation: See anesthesia records.   TEE was performed without complications   LV: Low normal 50-55%. RV: Grossly normal structure and function.  LA: Visually dilated. Spontaneous echo contrast was not present.  No thrombus present. Left atrial appendage: Spontaneous echo contrast was not present.  No thrombus present. Inter atrial septum is intact but redundant, bubble study negative for atrial level shunting. RA: Grossly normal.    MV: mild regurgitation,no stenosis, no vegetation noted.  TV: mild regurgitation,no stenosis, no vegetation noted. AV: no regurgitation,no stenosis, no vegetation noted.   PV: no regurgitation, no stenosis, no vegetation noted.   Thoracic and ascending aorta: Plaque present.  Final report forthcoming.  Delilah Shan [facility]  Pager: [phone] Office: [phone]  Direct current cardioversion:  Indication symptomatic: Symptomatic atrial fibrillation / flutter.   Procedure Details:  Consent: Risks of procedure as well as the alternatives and risks of each were explained to the (patient/son Toby).  Consent for procedure obtained.  Time Out: Verified patient identification, verified procedure, site/side was marked, verified correct patient position, special equipment/implants available, medications/allergies/relevent history reviewed, required imaging and test results available. PERFORMED.  Patient placed on cardiac monitor, pulse oximetry, supplemental oxygen as necessary.  Sedation given:  see anesthesia.  Pacer pads placed anterior and posterior chest.  Cardioverted 1 time(s).  Cardioversion with synchronized biphasic 100J shock.  Evaluation: Findings: Post procedure rhythm strip noted NSR w/ PAC; however, EKG in recovery notes patient back in atrial fibrillation.  Complications: None Patient did tolerate procedure well.  Findings discussed with her son Dickie La as well.    Delilah Shan [facility]  Pager: [phone] Office: [phone] 03/01/2021, 3:05 PM

## 2021-03-01 NOTE — Progress Notes (Signed)
eLink Physician-Brief Progress Note Patient Name: Michelle Dean DOB: 09/23/1945 MRN: [id number]   Date of Service  03/01/2021  HPI/Events of Note  Patient back in AFIB with RVR. Ventricular rate in 140's. Soft BP = 100/65.  eICU Interventions  Plan: BMP and Mg++ level STAT. D/C Cardizem and Amiodarone PO. Amiodarone IV load and infusion.      Intervention Category Major Interventions: Arrhythmia - evaluation and management  Xzaria Teo Eugene 03/01/2021, 11:08 PM

## 2021-03-01 NOTE — Interval H&P Note (Signed)
History and Physical Interval Note:  03/01/2021 2:06 PM  Michelle Dean  has presented today for surgery, with the diagnosis of aflutter.  The various methods of treatment have been discussed with the patient and family. After consideration of risks, benefits and other options for treatment, the patient has consented to  Procedure(s): TRANSESOPHAGEAL ECHOCARDIOGRAM (TEE) (N/A) CARDIOVERSION (N/A) as a surgical intervention.  The patient's history has been reviewed, patient examined, no change in status, stable for surgery.  I have reviewed the patient's chart and labs.  Questions were answered to the patient's satisfaction.    After careful review of history and examination, the risks, benefits of transesophageal echocardiogram, and alternatives have been explained to the patient and her [name]. Complications include but not limited to esophageal perforation (rare), gastrointestinal bleeding (rare), cardiac arrhythmia which can include cardiac arrest and death (rare), pharyngeal irritation / discomfort with swallowing / hematoma, methemoglobinemia, bronchospasm, transient hypoxia, nonsustained ventricular tachycardia, transient atrial fibrillation, minimal hemoptysis, vomiting, hypotension, respiratory compromise, reaction to medications, unavoidable damage to teeth and gums, aspiration pneumonia  were reviewed with the patient.  Patient voices understands, provides verbal feedback, questions answered, and patient and her [name] wishes to proceed with the procedure.    Michelle Dean [employer]

## 2021-03-01 NOTE — Progress Notes (Signed)
Inpatient Rehabilitation Admissions Coordinator   Noted for possible cardioversion. I will follow up on her progress on Monday.  Ottie Glazier, RN, MSN Rehab Admissions Coordinator [phone] 03/01/2021 1:58 PM

## 2021-03-01 NOTE — Progress Notes (Signed)
Patient back from procedure and taken directly into bathroom to void. Back to bed and on the monitor now. In no distress at the time. MD called to update me and will call pt's son as well. Jendayi Berling, [place] Scrape, RN

## 2021-03-01 NOTE — Progress Notes (Signed)
Progress Note  Patient Name: Michelle Dean Date of Encounter: 03/01/2021  Attending physician: Stroke, Md, MD Primary care provider: System, Provider Not In Primary Cardiologist: NA Consultant:Emonee Winkowski Odis Hollingshead, DO  Subjective: Michelle Dean is a 75 y.o. female who was seen and examined at bedside Remains in Afib w/ RVR.  Had to hold Cardizem doses due to soft BP.  No chest pain or shortness of breath  Spoke to RN and Neurology during morning rounds.  Consent obtained for TEE and cardioversion from her [name].  Case discussed and reviewed with her nurse.  Objective: Vital Signs in the last 24 hours: Temp:  [97.8 F (36.6 C)-98.7 F (37.1 C)] 98.7 F (37.1 C) (10/21 0800) Pulse Rate:  [65-136] 78 (10/21 0800) Resp:  [14-30] 21 (10/21 0800) BP: (102-139)/(57-124) 117/80 (10/21 0800) SpO2:  [94 %-100 %] 98 % (10/21 0800)  Intake/Output:  Intake/Output Summary (Last 24 hours) at 03/01/2021 0859 Last data filed at 03/01/2021 0800 Gross per 24 hour  Intake 1123.24 ml  Output 400 ml  Net 723.24 ml    Net IO Since Admission: 3,112.06 mL [03/01/21 0859]  Weights:  Filed Weights   02/25/21 1452 02/25/21 1514  Weight: 99.8 kg 90.2 kg    Telemetry: Personally reviewed.  Afib RVR  Physical examination: PHYSICAL EXAM: Vitals with BMI 03/01/2021 03/01/2021 03/01/2021  Height - - -  Weight - - -  BMI - - -  Systolic 117 106 132  Diastolic 80 70 66  Pulse 78 67 76    CONSTITUTIONAL: Resting in bed comfortably, no acute distress, appears older than stated age. SKIN: Skin is warm and dry. No rash noted. No cyanosis. No pallor. No jaundice HEAD: Normocephalic and atraumatic.  EYES: No scleral icterus MOUTH/THROAT: Moist oral membranes.  NECK: No JVD present. No thyromegaly noted. No carotid bruits  LYMPHATIC: No visible cervical adenopathy.  CHEST Normal respiratory effort. No intercostal retractions  LUNGS: Clear to auscultation bilaterally. no stridor. No wheezes. No  rales.  CARDIOVASCULAR:  Irregularly irregular, variable S1-S2, tachycardia, no murmurs rubs or gallops appreciated  ABDOMINAL: Soft, nontender, nondistended, positive bowel sounds in all 4 quadrants, no apparent ascites.  EXTREMITIES: No peripheral edema  HEMATOLOGIC: No significant bruising NEUROLOGIC: Moving all 4 extremities, strength at least 3+, both receptive and expressive aphasia.   PSYCHIATRIC: Normal mood and affect. Normal behavior. Cooperative  Lab Results: Hematology Recent Labs  Lab 02/26/21 0525 02/28/21 0356 03/01/21 0325  WBC 12.2* 12.2* 11.1*  RBC 4.11 3.91 3.57*  HGB 12.4 11.8* 10.7*  HCT 38.6 37.9 34.1*  MCV 93.9 96.9 95.5  MCH 30.2 30.2 30.0  MCHC 32.1 31.1 31.4  RDW 13.8 14.4 14.1  PLT 222 228 189    Chemistry Recent Labs  Lab 02/25/21 1441 02/26/21 0525 02/28/21 0356 03/01/21 0325  NA 141 142 142 140  K 3.4* 3.6 3.8 3.9  CL 107 109 109 107  CO2 26 25 24 28   GLUCOSE 113* 142* 113* 109*  BUN 21 10 10 12   CREATININE 0.89 0.75 0.80 0.83  CALCIUM 8.8* 8.4* 8.8* 8.6*  PROT 6.8  --   --   --   ALBUMIN 3.9  --   --   --   AST 20  --   --   --   ALT 14  --   --   --   ALKPHOS 56  --   --   --   BILITOT 0.6  --   --   --  GFRNONAA >60 >60 >60 >60  ANIONGAP 8 8 9 5      Cardiac Enzymes: Cardiac Panel (last 3 results) No results for input(s): CKTOTAL, CKMB, TROPONINIHS, RELINDX in the last 72 hours.  BNP (last 3 results) No results for input(s): BNP in the last 8760 hours.  ProBNP (last 3 results) No results for input(s): PROBNP in the last 8760 hours.   DDimer No results for input(s): DDIMER in the last 168 hours.   Hemoglobin A1c:  Lab Results  Component Value Date   HGBA1C 5.5 02/26/2021   MPG 111.15 02/26/2021    TSH  Recent Labs    02/27/21 1211  TSH 0.633    Lipid Panel     Component Value Date/Time   CHOL 131 02/26/2021 0525   TRIG 55 02/26/2021 0525   HDL 57 02/26/2021 0525   CHOLHDL 2.3 02/26/2021 0525   VLDL 11  02/26/2021 0525   LDLCALC 63 02/26/2021 0525    Imaging: No results found.  Cardiac database: EKG: 02/27/2021: Atrial flutter 2:1, 130bpm, right bundle branch block.  No prior EKGs available for comparison   Echocardiogram: 02/26/2021: 1. Left ventricular ejection fraction, by estimation, is 55 to 60%. The  left ventricle has normal function. The left ventricle has no regional  wall motion abnormalities. Left ventricular diastolic parameters are  consistent with Grade II diastolic dysfunction (pseudonormalization).   2. Right ventricular systolic function is normal. The right ventricular  size is mildly enlarged. Tricuspid regurgitation signal is inadequate for  assessing PA pressure.   3. Left atrial size was mildly dilated.   4. Right atrial size was mildly dilated.   5. The mitral valve is normal in structure. No evidence of mitral valve regurgitation. No evidence of mitral stenosis.   6. The aortic valve is tricuspid. Aortic valve regurgitation is not visualized. Mild aortic valve sclerosis is present, with no evidence of aortic valve stenosis.   7. The inferior vena cava is dilated in size with >50% respiratory variability, suggesting right atrial pressure of 8 mmHg.   Scheduled Meds:  amiodarone  200 mg Oral Daily   atorvastatin  10 mg Oral Daily   Chlorhexidine Gluconate Cloth  6 each Topical Daily   diltiazem  60 mg Oral Q6H   magnesium oxide  400 mg Oral Daily   mometasone-formoterol  2 puff Inhalation BID   multivitamin with minerals  1 tablet Oral Daily   umeclidinium bromide  1 puff Inhalation Daily    Continuous Infusions:  sodium chloride     heparin 1,400 Units/hr (03/01/21 0824)    PRN Meds: acetaminophen **OR** acetaminophen (TYLENOL) oral liquid 160 mg/5 mL **OR** acetaminophen, albuterol, labetalol **AND** [DISCONTINUED] clevidipine, senna-docusate   IMPRESSION & RECOMMENDATIONS: Michelle Dean is a 75 y.o. female whose past medical history and cardiac  risk factors include: Recent stroke (M2/M3 occlusion) s/p TNKase followed by partial recannulation with reocclusion followed by mechanical thrombolysis achieving TICI 2C revascularization, HTN, HLD, COPD on intermittent oxygen support, postmenopausal female, advanced age.  Atrial fibrillation with rapid ventricular rate: Tranistion IV Amiodarone to oral  Off Cardizem gtt as of yesterday.  Due to soft BP did not get last two dose of oral cardizem.  Continues to be in Afib w/ RVR.  Discussed with son regarding the risk, benefits, and alternative of TEE guided cardioversion.  After careful review of history and examination, the risks, benefits of transesophageal echocardiogram, and alternatives have been explained to the patient and son 60. Complications include but not limited  to esophageal perforation (rare), gastrointestinal bleeding (rare), cardiac arrhythmia which can include cardiac arrest and death (rare), pharyngeal irritation / discomfort with swallowing / hematoma, methemoglobinemia, bronchospasm, transient hypoxia, nonsustained ventricular tachycardia, transient atrial fibrillation, minimal hemoptysis, vomiting, hypotension, respiratory compromise, reaction to medications, unavoidable damage to teeth and gums, aspiration pneumonia  were reviewed with the patient.  Patient voices understands, provides verbal feedback, questions answered, and patient and [name] wishes to proceed with the procedure. Risks, benefits, and alternatives of direct current cardioversion reviewed with the and patient and [name]. Risk includes but not limited to: potential for post-cardioversion rhythms, life-threatening arrhythmias (ventricular tachycardia and fibrillation, profound bradycardia, cardiac arrest), myocardial damage, acute pulmonary edema, skin burns, transient hypotension. Benefits include restoration of sinus rhythm. Alternatives to treatment were discussed, questions were answered, patient voices  understanding and provides verbal feedback.  Toby agrees and is willing to proceed.  Tolerating IV heparin well - transition to DOAC prior to discharge to transfer to rehab.  Recommendations conveyed to Dr. Roda Shutters.   Left MCA Stroke:  Management per neurology.   Hypertension:  Well controlled.  Management per primary and CCM teams.   Hyperlipidemia:  LDL is less than 70mg /dL Continue statin therapy. Most of his recent lipid profile reviewed.  Continue your care with regards to the management of her other chronic co-morbid conditions and active problem list..   This note was created using a voice recognition software as a result there may be grammatical errors inadvertently enclosed that do not reflect the nature of this encounter. Every attempt is made to correct such errors.  , DO, [facility] Office: [phone] 03/01/2021, 8:59 AM

## 2021-03-01 NOTE — Anesthesia Preprocedure Evaluation (Addendum)
Anesthesia Evaluation  Patient identified by MRN, date of birth, ID band Patient awake    Airway Mallampati: II  TM Distance: >3 FB     Dental   Pulmonary neg pulmonary ROS,    breath sounds clear to auscultation       Cardiovascular hypertension,  Rhythm:Regular Rate:Normal     Neuro/Psych CVA    GI/Hepatic negative GI ROS, Neg liver ROS,   Endo/Other  negative endocrine ROS  Renal/GU negative Renal ROS     Musculoskeletal   Abdominal   Peds  Hematology   Anesthesia Other Findings   Reproductive/Obstetrics                             Anesthesia Physical Anesthesia Plan  ASA: 3  Anesthesia Plan: General   Post-op Pain Management:    Induction: Intravenous  PONV Risk Score and Plan: 2 and Treatment may vary due to age or medical condition  Airway Management Planned: Nasal Cannula, Simple Face Mask and Mask  Additional Equipment:   Intra-op Plan:   Post-operative Plan:   Informed Consent: I have reviewed the patients History and Physical, chart, labs and discussed the procedure including the risks, benefits and alternatives for the proposed anesthesia with the patient or authorized representative who has indicated his/her understanding and acceptance.     Dental advisory given  Plan Discussed with: CRNA and Anesthesiologist  Anesthesia Plan Comments:       Anesthesia Quick Evaluation

## 2021-03-01 NOTE — Progress Notes (Signed)
NAME:  Michelle Dean, MRN:  [id number], DOB:  [birth year], LOS: 4 ADMISSION DATE:  02/25/2021, CONSULTATION DATE: 02/27/2021 REFERRING MD: Dr. Thomasena Edis, CHIEF COMPLAINT: Aphasia  History of Present Illness:  Michelle Dean is a 75 y.o. female who has a PMH as outlined below.  She presented to any [facility] ED 10/17 with acute onset of confusion and expressive and receptive aphasia.  CT head was negative for hemorrhage.  She received TNKase.  CTA revealed left M2/M3 occlusion for which she was transferred to [facility] for angiogram and possible thrombectomy.   She was taken to IR and had partial recanalization with reocclusion followed by mechanical thrombolysis achieving a TICI 2C revascularization.   10/19, she had A. fib with RVR.  She was started on Cardizem however RVR persisted.  PCCM subsequently asked to see in consultation.  Pertinent  Medical History  has Acute ischemic stroke (HCC); Stroke (cerebrum) (HCC); Middle cerebral artery embolism, left; Aphasia; Atrial fibrillation (HCC); Atrial flutter with rapid ventricular response (HCC); Benign hypertension; and Mixed hyperlipidemia on their problem list.  Significant Hospital Events: Including procedures, antibiotic start and stop dates in addition to other pertinent events   10/17> admit 10/19> PCCM consult for A. fib RVR  Interim History / Subjective:  Awake, tachycardic Aphasic  Objective   Blood pressure (!) 122/93, pulse (!) 114, temperature 98.7 F (37.1 C), temperature source Oral, resp. rate 20, height 5\' 7"  (1.702 m), weight 90.2 kg, SpO2 90 %.        Intake/Output Summary (Last 24 hours) at 03/01/2021 0920 Last data filed at 03/01/2021 0900 Gross per 24 hour  Intake 1106.27 ml  Output 400 ml  Net 706.27 ml   Filed Weights   02/25/21 1452 02/25/21 1514  Weight: 99.8 kg 90.2 kg    Examination: General: Does not appear to be in distress HENT: Expressive aphasia Lungs: Clear breath sounds  bilaterally Cardiovascular: S1-S2 appreciated Abdomen: Bowel sounds appreciated Extremities: No clubbing, no edema Neuro: Alert GU: Fair output  CTA head 10/17 > Left M2/M3 occlusion. CT 10/18 > left MCA contrast staining, hypodensity in left temporal lobe. MRI brain 10/18 > mod posterior left MCA infarct, mild to mod chronic small vessel ischemic disease. Echo 10/18 > EF 55-60%, G2DD, mildly enlarged RV/LA/RA. LE duplex 10/18 > neg. TSH normal.  Resolved Hospital Problem list     Assessment & Plan:  Left M2/M3 occlusion -S/p TNKase and partial recanalization with reocclusion followed by mechanical thrombolysis achieving a TICI 2C vascularization -Stroke team continues to follow -Speech eval for aphasia -Progressive ambulation  A. fib with RVR -New onset -On IV aspirin -Oral diltiazem -Tentatively scheduled for TEE guided cardioversion today  History of COPD -Supplemental oxygen -Continue bronchodilators  Give dose of lopressor X1    Best Practice (right click and "Reselect all SmartList Selections" daily)   Diet/type: Regular consistency (see orders) DVT prophylaxis: systemic heparin GI prophylaxis: N/A Lines: N/A Foley:  N/A Code Status:  full code Last date of multidisciplinary goals of care discussion [pending ]  Labs   CBC: Recent Labs  Lab 02/25/21 1441 02/26/21 0525 02/28/21 0356 03/01/21 0325  WBC 9.9 12.2* 12.2* 11.1*  NEUTROABS 6.2 10.2*  --   --   HGB 13.6 12.4 11.8* 10.7*  HCT 43.6 38.6 37.9 34.1*  MCV 95.8 93.9 96.9 95.5  PLT 225 222 228 189    Basic Metabolic Panel: Recent Labs  Lab 02/25/21 1441 02/26/21 0525 02/28/21 0356 03/01/21 0325  NA 141 142 142 140  K 3.4* 3.6 3.8 3.9  CL 107 109 109 107  CO2 26 25 24 28   GLUCOSE 113* 142* 113* 109*  BUN 21 10 10 12   CREATININE 0.89 0.75 0.80 0.83  CALCIUM 8.8* 8.4* 8.8* 8.6*  MG  --   --  1.6*  --    GFR: Estimated Creatinine Clearance: 67.5 mL/min (by C-G formula based on SCr  of 0.83 mg/dL). Recent Labs  Lab 02/25/21 1441 02/26/21 0525 02/28/21 0356 03/01/21 0325  WBC 9.9 12.2* 12.2* 11.1*    Liver Function Tests: Recent Labs  Lab 02/25/21 1441  AST 20  ALT 14  ALKPHOS 56  BILITOT 0.6  PROT 6.8  ALBUMIN 3.9   No results for input(s): LIPASE, AMYLASE in the last 168 hours. No results for input(s): AMMONIA in the last 168 hours.  ABG No results found for: PHART, PCO2ART, PO2ART, HCO3, TCO2, ACIDBASEDEF, O2SAT   Coagulation Profile: Recent Labs  Lab 02/25/21 1441 03/01/21 0325  INR 1.0 1.1    Cardiac Enzymes: No results for input(s): CKTOTAL, CKMB, CKMBINDEX, TROPONINI in the last 168 hours.  HbA1C: Hgb A1c MFr Bld  Date/Time Value Ref Range Status  02/26/2021 05:25 AM 5.5 4.8 - 5.6 % Final    Comment:    (NOTE) Pre diabetes:          5.7%-6.4%  Diabetes:              >6.4%  Glycemic control for   <7.0% adults with diabetes     CBG: Recent Labs  Lab 02/25/21 1420  GLUCAP 99    Review of Systems:   unremarkable  Past Medical History:  She,  has no past medical history on file.   Surgical History:   Past Surgical History:  Procedure Laterality Date   IR CT HEAD LTD  02/25/2021   IR PERCUTANEOUS ART THROMBECTOMY/INFUSION INTRACRANIAL INC DIAG ANGIO  02/25/2021   RADIOLOGY WITH ANESTHESIA N/A 02/25/2021   Procedure: IR WITH ANESTHESIA;  Surgeon: Radiologist, Medication, MD;  Location: [facility] OR;  Service: Radiology;  Laterality: N/A;     Social History:      Family History:  Her family history is not on file.   Allergies Allergies  Allergen Reactions   Aspirin     Other reaction(s): Bleeding   Penicillins Hives and Rash   Lisinopril Cough     Home Medications  Prior to Admission medications   Medication Sig Start Date End Date Taking? Authorizing Provider  albuterol (PROVENTIL) (2.5 MG/3ML) 0.083% nebulizer solution Take 2.5 mg by nebulization every 4 (four) hours as needed for wheezing. 11/21/19  Yes  [provider]  atorvastatin (LIPITOR) 10 MG tablet Take 10 mg by mouth daily. 12/24/20  Yes [provider]  carboxymethylcellulose (REFRESH PLUS) 0.5 % SOLN Place 1 drop into both eyes 4 (four) times daily as needed (dry eyes).   Yes [provider]  DULERA 100-5 MCG/ACT AERO Inhale 2 puffs into the lungs 2 (two) times daily. 02/14/21  Yes [provider]  losartan (COZAAR) 25 MG tablet Take 25 mg by mouth daily. 02/21/21  Yes [provider]  Multiple Vitamin (MULTIVITAMIN) tablet Take 1 tablet by mouth daily.   Yes [provider]  omeprazole (PRILOSEC) 20 MG capsule Take 20 mg by mouth daily. 02/04/21  Yes [provider]  tiotropium (SPIRIVA HANDIHALER) 18 MCG inhalation capsule Place 18 mcg into inhaler and inhale daily. 01/29/21  Yes [provider]     The patient  is critically ill with multiple organ systems failure and requires high complexity decision making for assessment and support, frequent evaluation and titration of therapies, application of advanced monitoring technologies and extensive interpretation of multiple databases. Critical Care Time devoted to patient care services described in this note independent of APP/resident time (if applicable)  is 32 minutes.   Virl Diamond MD [facility] Personal pager: See Amion If unanswered, please page CCM On-call: #[phone]

## 2021-03-01 NOTE — Progress Notes (Signed)
STROKE TEAM PROGRESS NOTE   SUBJECTIVE (INTERVAL HISTORY) Her son and husband are at the bedside. Pt still A. fib RVR with heart rate 140s.  Patient still asymptomatic, neuro stable, sitting in chair.  Cardiology on board, continue heparin IV, p.o. Cardizem and amiodarone, will schedule for afternoon TEE and possible cardioversion.  OBJECTIVE Temp:  [97.8 F (36.6 C)-98.7 F (37.1 C)] 98.7 F (37.1 C) (10/21 0800) Pulse Rate:  [65-144] 105 (10/21 1100) Cardiac Rhythm: Atrial fibrillation (10/21 0800) Resp:  [14-29] 22 (10/21 1100) BP: (102-141)/(57-124) 108/95 (10/21 1100) SpO2:  [90 %-100 %] 99 % (10/21 1100)  Recent Labs  Lab 02/25/21 1420  GLUCAP 99   Recent Labs  Lab 02/25/21 1441 02/26/21 0525 02/28/21 0356 03/01/21 0325  NA 141 142 142 140  K 3.4* 3.6 3.8 3.9  CL 107 109 109 107  CO2 26 25 24 28   GLUCOSE 113* 142* 113* 109*  BUN 21 10 10 12   CREATININE 0.89 0.75 0.80 0.83  CALCIUM 8.8* 8.4* 8.8* 8.6*  MG  --   --  1.6*  --    Recent Labs  Lab 02/25/21 1441  AST 20  ALT 14  ALKPHOS 56  BILITOT 0.6  PROT 6.8  ALBUMIN 3.9   Recent Labs  Lab 02/25/21 1441 02/26/21 0525 02/28/21 0356 03/01/21 0325  WBC 9.9 12.2* 12.2* 11.1*  NEUTROABS 6.2 10.2*  --   --   HGB 13.6 12.4 11.8* 10.7*  HCT 43.6 38.6 37.9 34.1*  MCV 95.8 93.9 96.9 95.5  PLT 225 222 228 189   No results for input(s): CKTOTAL, CKMB, CKMBINDEX, TROPONINI in the last 168 hours. Recent Labs    03/01/21 0325  LABPROT 14.0  INR 1.1   No results for input(s): COLORURINE, LABSPEC, PHURINE, GLUCOSEU, HGBUR, BILIRUBINUR, KETONESUR, PROTEINUR, UROBILINOGEN, NITRITE, LEUKOCYTESUR in the last 72 hours.  Invalid input(s): APPERANCEUR      Component Value Date/Time   CHOL 131 02/26/2021 0525   TRIG 55 02/26/2021 0525   HDL 57 02/26/2021 0525   CHOLHDL 2.3 02/26/2021 0525   VLDL 11 02/26/2021 0525   LDLCALC 63 02/26/2021 0525   Lab Results  Component Value Date   HGBA1C 5.5 02/26/2021       Component Value Date/Time   LABOPIA NONE DETECTED 02/26/2021 0256   COCAINSCRNUR NONE DETECTED 02/26/2021 0256   LABBENZ NONE DETECTED 02/26/2021 0256   AMPHETMU NONE DETECTED 02/26/2021 0256   THCU NONE DETECTED 02/26/2021 0256   LABBARB NONE DETECTED 02/26/2021 0256    Recent Labs  Lab 02/25/21 1441  ETH <10    I have personally reviewed the radiological images below and agree with the radiology interpretations.  CT HEAD WO CONTRAST (02/28/2021)  Result Date: 02/26/2021 CLINICAL DATA:  Stroke follow-up EXAM: CT HEAD WITHOUT CONTRAST TECHNIQUE: Contiguous axial images were obtained from the base of the skull through the vertex without intravenous contrast. COMPARISON:  None. FINDINGS: Brain: High-density material in the left temporal and posterior frontal lobe, status post interval interventional thrombectomy. Area of hypodensity in the left temporal lobe, most likely edema. No new cortical infarct. No hydrocephalus, extra-axial collection, mass, mass effect, or midline shift. Vascular: No hyperdense vessel or unexpected calcification. Skull: Normal. Negative for fracture or focal lesion. Sinuses/Orbits: No acute finding. Other: None. IMPRESSION: 1. Hyperdense material in the left temporal and frontal lobes, in an area that has recently been treated with interventional thrombectomy, favored to represent contrast staining and not hemorrhage. Recommend follow-up exam in 6 hours for  further evaluation. 2. Hypodensity in the left temporal lobe, possibly edema from recent infarct. These results were called by telephone at the time of interpretation on 02/26/2021 at 3:02 Am to provider [facility] , who verbally acknowledged these results. Electronically Signed   By: Wiliam Ke M.D.   On: 02/26/2021 03:03   MR BRAIN WO CONTRAST  Result Date: 02/26/2021 CLINICAL DATA:  Stroke follow-up. Aphasia. Status post TNK and revascularization of an occluded left MCA branch vessel. EXAM: MRI HEAD WITHOUT  CONTRAST TECHNIQUE: Multiplanar, multiecho pulse sequences of the brain and surrounding structures were obtained without intravenous contrast. COMPARISON:  Head CT 02/26/2021 FINDINGS: Brain: There is a moderate-sized acute infarct involving the left temporal lobe, inferior left parietal lobe, lateral left occipital lobe, and left insula (posterior MCA territory and MCA/PCA border zone). There is associated cytotoxic edema with sulcal effacement. Associated susceptibility artifact along the left sylvian fissure and left temporoparietal sulci corresponds to hyperdensity on CT and may reflect a combination of subarachnoid hemorrhage and cortical petechial hemorrhage. T2 hyperintensities elsewhere in the cerebral white matter bilaterally are nonspecific but compatible with mild-to-moderate chronic small vessel ischemic disease. There is mild cerebral atrophy. No midline shift or extra-axial fluid collection is evident. Vascular: Major intracranial vascular flow voids are preserved. Skull and upper cervical spine: Unremarkable bone marrow signal. Sinuses/Orbits: Bilateral cataract extraction. Trace left mastoid effusion. Clear paranasal sinuses. Other: None. IMPRESSION: 1. Moderate-sized acute posterior left MCA infarct. 2. Mild-to-moderate chronic small vessel ischemic disease. Electronically Signed   By: Sebastian Ache M.D.   On: 02/26/2021 16:21   IR CT Head Ltd  Result Date: 02/28/2021 INDICATION: New onset aphasia. Occluded left middle cerebral artery inferior division in the distal M2 M3 region on CT angiogram of the head and neck. EXAM: 1. EMERGENT LARGE VESSEL OCCLUSION THROMBOLYSIS (anterior CIRCULATION) COMPARISON:  CT angiogram of the head and neck of 02/25/2021. MEDICATIONS: Ancef 2 g IV antibiotic was administered within 1 hour of the procedure. ANESTHESIA/SEDATION: General anesthesia. CONTRAST:  Omnipaque 300 approximately 90 cc. FLUOROSCOPY TIME:  Fluoroscopy Time: 49 minutes 0 seconds (2707 mGy).  COMPLICATIONS: None immediate. TECHNIQUE: Following a full explanation of the procedure along with the potential associated complications, an informed witnessed consent was obtained. The risks of intracranial hemorrhage of 10%, worsening neurological deficit, ventilator dependency, death and inability to revascularize were all reviewed in detail with the patient's son. The patient was then put under general anesthesia by the Department of Anesthesiology at [facility]. The right groin was prepped and draped in the usual sterile fashion. Thereafter using modified Seldinger technique, transfemoral access into the right common femoral artery was obtained without difficulty. Over a 0.035 inch guidewire an8 [place] Pinnacle 25 cm sheath was inserted. Through this, and also over a 0.035 inch guidewire a 5 [place] JB 1 catheter was advanced to the aortic arch region and selectively positioned in the left common carotid artery. FINDINGS: The left common carotid arteriogram demonstrates the left external carotid artery and its major branches to be widely patent. The left internal carotid artery at the bulb to the skull base is widely patent. The petrous, the cavernous and the supraclinoid segments are widely patent. A left posterior communicating artery is seen opacifying the left posterior cerebral artery distribution. The left anterior cerebral artery opacifies into the capillary and venous phases. The left middle cerebral artery M1 segment and superior division is widely patent. The inferior division superior parietal branch demonstrates wide patency into the capillary and venous phases.  Complete angiographic occlusion is seen of the inferior division in the distal M2 M3 segment. PROCEDURE: Over a 0.035 inch exchange Roadrunner guidewire, the 5 [place] diagnostic catheter was exchanged for a 95 cm 087 balloon guide catheter which had been prepped with 50% contrast and 50% heparinized saline infusion and advanced to  the distal cervical left ICA. The guidewire was removed. Good aspiration obtained from the hub of the balloon guide catheter. Gentle control arteriogram performed through the balloon guide catheter demonstrated no evidence of spasms, dissections or of intraluminal filling defects. No change was seen in the intracranial circulation. Over a 0.014 inch standard Synchro micro guidewire with a moderate J configuration, an 021 160 cm microcatheter was advanced in combination with an 071 132 cm Zoom aspiration catheter to supraclinoid left ICA. The micro guidewire was then gently manipulated with a torque device and advanced into the occluded angular branch of the inferior division in the distal M2 M3 segment. The guidewire was removed. Slow aspiration of contrast was noted from the hub of the microcatheter. A gentle contrast injection demonstrates safe position of the tip of the microcatheter which was then connected to continuous heparinized saline infusion. A 3 mm x 40 mm Solitaire X retrieval device was then advanced to the distal end of the microcatheter and deployed in the usual manner. The 071 Zoom aspiration catheter was advanced into the origin of the inferior division. With constant aspiration at the hub of the balloon guide catheter with proximal flow arrest in the left internal carotid artery and aspiration with a Penumbra device the hub of the Zoom aspiration catheter for approximately 2 minutes, the combination of the retrieval device, the microcatheter and the Zoom aspiration catheter was retrieved and removed. A control arteriogram performed following reversal of flow arrest in the left internal carotid artery demonstrated modest improvement in distal flow into the M3 region of the angular branch. However, this was not sustained. A second pass was then made again using combination of the 132 071 Zoom aspiration catheter advanced with an 021 microcatheter again over a 0.014 inch. Access was obtained into the  occluded inferior division angular branch followed by the microcatheter. However, the microcatheter could not be advanced more distally. A gentle control arteriogram performed demonstrated safe position without evidence of any extravasation. At this time the Zoom aspiration catheter was advanced to as close as possible to the occluded middle cerebral artery branch. Contact aspiration was then performed with an 035 Zoom aspiration catheter which had been advanced and engaged in the occluded segment of the angular branch. Aspiration was then performed with a Penumbra aspiration device at the hub of the 071 Zoom aspiration catheter with gentle aspiration with a 20 mL syringe at the hub of the 035 Zoom aspiration catheter for approximately 2 minutes. After that, the combination of the Zoom aspiration catheter was retrieved and removed, copious amounts of clot was noted in the aspirate from the 071 Zoom aspiration catheter. A control arteriogram performed through the balloon guide catheter in the left internal carotid artery continued to demonstrate near occlusion of the distal left M2 segment into the M3 region. 3 aliquots of 25 mcg of nitroglycerin was then infused through the balloon aspiration catheter in the left internal carotid artery. However, again noted was just a minimal amount of distal flow past the occlusion site. A third pass was made again using just an 021 160 cm microcatheter which was advanced to the site of the occlusion of the angular branch over  a 0.014 inch standard Synchro micro guidewire without difficulty. Multiple attempts were made to advance the micro guidewire gently through the occlusive site without success. The microcatheter was removed. Control arteriogram performed through the balloon catheter in the left internal carotid artery continued to demonstrate wide patency of the left middle cerebral artery territory superior divisions and the anterior parietal branch of the inferior division.  No significant change was noted in the occluded angular branch in the distal left M2 M3 region. Throughout the procedure, the patient's blood pressure and neurological status remained stable. No evidence of extravasation was noted. The balloon guide was then removed. The 8 French Pinnacle sheath was removed. Hemostasis was achieved with a 6 French Angio-Seal closure device, and a quick clot compression for about 20 minutes. A CT of the brain demonstrated no evidence of mass effect or midline shift. Contrast was noted in the left anterior perisylvian fissure, and also in the temporoparietal convexity area. Distal pulses remained Dopplerable in both feet unchanged. The patient was extubated. Upon recovery, the patient was able to move all fours spontaneously and to command. She did exhibit difficulty with comprehension. She was then transferred to the neuro ICU for post revascularization care. IMPRESSION: Status post attempted revascularization of occluded distal left MCA inferior division angular branch in the M2 M3 region with 1 pass with a 3 mm x 40 mm Solitaire X retrieval device and contact aspiration, 1 pass with contact aspiration and 1 pass with mechanical thrombolysis with a TICI 2C revascularization. PLAN: Follow-up in the clinic approximately 4 weeks post discharge. Electronically Signed   By: Julieanne Cotton M.D.   On: 02/28/2021 08:36   ECHOCARDIOGRAM COMPLETE  Result Date: 02/26/2021    ECHOCARDIOGRAM REPORT   Patient Name:   UGOCHI HENZLER Date of Exam: 02/26/2021 Medical Rec #:  [id number]     Height:       67.0 in Accession #:    [id number]    Weight:       198.9 lb Date of Birth:  Sep 29, 1945    BSA:          2.018 m Patient Age:    74 years      BP:           161/66 mmHg Patient Gender: F             HR:           86 bpm. Exam Location:  Inpatient Procedure: 2D Echo, Color Doppler and Cardiac Doppler Indications:    Stroke i63.9  History:        Patient has no prior history of Echocardiogram  examinations.  Sonographer:    Irving Burton Senior RDCS Referring Phys: [phone] ERIC LINDZEN IMPRESSIONS  1. Left ventricular ejection fraction, by estimation, is 55 to 60%. The left ventricle has normal function. The left ventricle has no regional wall motion abnormalities. Left ventricular diastolic parameters are consistent with Grade II diastolic dysfunction (pseudonormalization).  2. Right ventricular systolic function is normal. The right ventricular size is mildly enlarged. Tricuspid regurgitation signal is inadequate for assessing PA pressure.  3. Left atrial size was mildly dilated.  4. Right atrial size was mildly dilated.  5. The mitral valve is normal in structure. No evidence of mitral valve regurgitation. No evidence of mitral stenosis.  6. The aortic valve is tricuspid. Aortic valve regurgitation is not visualized. Mild aortic valve sclerosis is present, with no evidence of aortic valve stenosis.  7. The inferior vena cava is dilated  in size with >50% respiratory variability, suggesting right atrial pressure of 8 mmHg. FINDINGS  Left Ventricle: Left ventricular ejection fraction, by estimation, is 55 to 60%. The left ventricle has normal function. The left ventricle has no regional wall motion abnormalities. The left ventricular internal cavity size was normal in size. There is  no left ventricular hypertrophy. Left ventricular diastolic parameters are consistent with Grade II diastolic dysfunction (pseudonormalization). Right Ventricle: The right ventricular size is mildly enlarged. No increase in right ventricular wall thickness. Right ventricular systolic function is normal. Tricuspid regurgitation signal is inadequate for assessing PA pressure. Left Atrium: Left atrial size was mildly dilated. Right Atrium: Right atrial size was mildly dilated. Pericardium: There is no evidence of pericardial effusion. Mitral Valve: The mitral valve is normal in structure. No evidence of mitral valve regurgitation. No  evidence of mitral valve stenosis. Tricuspid Valve: The tricuspid valve is normal in structure. Tricuspid valve regurgitation is not demonstrated. Aortic Valve: The aortic valve is tricuspid. Aortic valve regurgitation is not visualized. Mild aortic valve sclerosis is present, with no evidence of aortic valve stenosis. Pulmonic Valve: The pulmonic valve was normal in structure. Pulmonic valve regurgitation is not visualized. Aorta: The aortic root is normal in size and structure. Venous: The inferior vena cava is dilated in size with greater than 50% respiratory variability, suggesting right atrial pressure of 8 mmHg. IAS/Shunts: No atrial level shunt detected by color flow Doppler.  LEFT VENTRICLE PLAX 2D LVIDd:         3.80 cm   Diastology LVIDs:         2.40 cm   LV e' medial:    6.96 cm/s LV PW:         0.80 cm   LV E/e' medial:  11.3 LV IVS:        0.90 cm   LV e' lateral:   9.25 cm/s LVOT diam:     1.90 cm   LV E/e' lateral: 8.5 LV SV:         51 LV SV Index:   25 LVOT Area:     2.84 cm  RIGHT VENTRICLE RV S prime:     15.40 cm/s TAPSE (M-mode): 2.8 cm LEFT ATRIUM             Index        RIGHT ATRIUM           Index LA diam:        1.90 cm 0.94 cm/m   RA Area:     20.60 cm LA Vol (A2C):   45.9 ml 22.75 ml/m  RA Volume:   62.20 ml  30.83 ml/m LA Vol (A4C):   60.5 ml 29.99 ml/m LA Biplane Vol: 53.0 ml 26.27 ml/m  AORTIC VALVE LVOT Vmax:   109.00 cm/s LVOT Vmean:  72.733 cm/s LVOT VTI:    0.179 m  AORTA Ao Root diam: 3.30 cm Ao Asc diam:  3.30 cm MITRAL VALVE MV Area (PHT): 3.61 cm    SHUNTS MV Decel Time: 210 msec    Systemic VTI:  0.18 m MV E velocity: 78.50 cm/s  Systemic Diam: 1.90 cm MV A velocity: 74.40 cm/s MV E/A ratio:  1.06 Dalton McleanMD Electronically signed by Wilfred Lacy Signature Date/Time: 02/26/2021/1:42:18 PM    Final    IR PERCUTANEOUS ART THROMBECTOMY/INFUSION INTRACRANIAL INC DIAG ANGIO  Result Date: 02/28/2021 INDICATION: New onset aphasia. Occluded left middle cerebral  artery inferior division in the distal M2 M3 region on  CT angiogram of the head and neck. EXAM: 1. EMERGENT LARGE VESSEL OCCLUSION THROMBOLYSIS (anterior CIRCULATION) COMPARISON:  CT angiogram of the head and neck of 02/25/2021. MEDICATIONS: Ancef 2 g IV antibiotic was administered within 1 hour of the procedure. ANESTHESIA/SEDATION: General anesthesia. CONTRAST:  Omnipaque 300 approximately 90 cc. FLUOROSCOPY TIME:  Fluoroscopy Time: 49 minutes 0 seconds (2707 mGy). COMPLICATIONS: None immediate. TECHNIQUE: Following a full explanation of the procedure along with the potential associated complications, an informed witnessed consent was obtained. The risks of intracranial hemorrhage of 10%, worsening neurological deficit, ventilator dependency, death and inability to revascularize were all reviewed in detail with the patient's son. The patient was then put under general anesthesia by the Department of Anesthesiology at [facility]. The right groin was prepped and draped in the usual sterile fashion. Thereafter using modified Seldinger technique, transfemoral access into the right common femoral artery was obtained without difficulty. Over a 0.035 inch guidewire an8 [place] Pinnacle 25 cm sheath was inserted. Through this, and also over a 0.035 inch guidewire a 5 [place] JB 1 catheter was advanced to the aortic arch region and selectively positioned in the left common carotid artery. FINDINGS: The left common carotid arteriogram demonstrates the left external carotid artery and its major branches to be widely patent. The left internal carotid artery at the bulb to the skull base is widely patent. The petrous, the cavernous and the supraclinoid segments are widely patent. A left posterior communicating artery is seen opacifying the left posterior cerebral artery distribution. The left anterior cerebral artery opacifies into the capillary and venous phases. The left middle cerebral artery M1 segment and superior  division is widely patent. The inferior division superior parietal branch demonstrates wide patency into the capillary and venous phases. Complete angiographic occlusion is seen of the inferior division in the distal M2 M3 segment. PROCEDURE: Over a 0.035 inch exchange Roadrunner guidewire, the 5 [place] diagnostic catheter was exchanged for a 95 cm 087 balloon guide catheter which had been prepped with 50% contrast and 50% heparinized saline infusion and advanced to the distal cervical left ICA. The guidewire was removed. Good aspiration obtained from the hub of the balloon guide catheter. Gentle control arteriogram performed through the balloon guide catheter demonstrated no evidence of spasms, dissections or of intraluminal filling defects. No change was seen in the intracranial circulation. Over a 0.014 inch standard Synchro micro guidewire with a moderate J configuration, an 021 160 cm microcatheter was advanced in combination with an 071 132 cm Zoom aspiration catheter to supraclinoid left ICA. The micro guidewire was then gently manipulated with a torque device and advanced into the occluded angular branch of the inferior division in the distal M2 M3 segment. The guidewire was removed. Slow aspiration of contrast was noted from the hub of the microcatheter. A gentle contrast injection demonstrates safe position of the tip of the microcatheter which was then connected to continuous heparinized saline infusion. A 3 mm x 40 mm Solitaire X retrieval device was then advanced to the distal end of the microcatheter and deployed in the usual manner. The 071 Zoom aspiration catheter was advanced into the origin of the inferior division. With constant aspiration at the hub of the balloon guide catheter with proximal flow arrest in the left internal carotid artery and aspiration with a Penumbra device the hub of the Zoom aspiration catheter for approximately 2 minutes, the combination of the retrieval device, the  microcatheter and the Zoom aspiration catheter was retrieved and removed. A  control arteriogram performed following reversal of flow arrest in the left internal carotid artery demonstrated modest improvement in distal flow into the M3 region of the angular branch. However, this was not sustained. A second pass was then made again using combination of the 132 071 Zoom aspiration catheter advanced with an 021 microcatheter again over a 0.014 inch. Access was obtained into the occluded inferior division angular branch followed by the microcatheter. However, the microcatheter could not be advanced more distally. A gentle control arteriogram performed demonstrated safe position without evidence of any extravasation. At this time the Zoom aspiration catheter was advanced to as close as possible to the occluded middle cerebral artery branch. Contact aspiration was then performed with an 035 Zoom aspiration catheter which had been advanced and engaged in the occluded segment of the angular branch. Aspiration was then performed with a Penumbra aspiration device at the hub of the 071 Zoom aspiration catheter with gentle aspiration with a 20 mL syringe at the hub of the 035 Zoom aspiration catheter for approximately 2 minutes. After that, the combination of the Zoom aspiration catheter was retrieved and removed, copious amounts of clot was noted in the aspirate from the 071 Zoom aspiration catheter. A control arteriogram performed through the balloon guide catheter in the left internal carotid artery continued to demonstrate near occlusion of the distal left M2 segment into the M3 region. 3 aliquots of 25 mcg of nitroglycerin was then infused through the balloon aspiration catheter in the left internal carotid artery. However, again noted was just a minimal amount of distal flow past the occlusion site. A third pass was made again using just an 021 160 cm microcatheter which was advanced to the site of the occlusion of the  angular branch over a 0.014 inch standard Synchro micro guidewire without difficulty. Multiple attempts were made to advance the micro guidewire gently through the occlusive site without success. The microcatheter was removed. Control arteriogram performed through the balloon catheter in the left internal carotid artery continued to demonstrate wide patency of the left middle cerebral artery territory superior divisions and the anterior parietal branch of the inferior division. No significant change was noted in the occluded angular branch in the distal left M2 M3 region. Throughout the procedure, the patient's blood pressure and neurological status remained stable. No evidence of extravasation was noted. The balloon guide was then removed. The 8 French Pinnacle sheath was removed. Hemostasis was achieved with a 6 French Angio-Seal closure device, and a quick clot compression for about 20 minutes. A CT of the brain demonstrated no evidence of mass effect or midline shift. Contrast was noted in the left anterior perisylvian fissure, and also in the temporoparietal convexity area. Distal pulses remained Dopplerable in both feet unchanged. The patient was extubated. Upon recovery, the patient was able to move all fours spontaneously and to command. She did exhibit difficulty with comprehension. She was then transferred to the neuro ICU for post revascularization care. IMPRESSION: Status post attempted revascularization of occluded distal left MCA inferior division angular branch in the M2 M3 region with 1 pass with a 3 mm x 40 mm Solitaire X retrieval device and contact aspiration, 1 pass with contact aspiration and 1 pass with mechanical thrombolysis with a TICI 2C revascularization. PLAN: Follow-up in the clinic approximately 4 weeks post discharge. Electronically Signed   By: Julieanne Cotton M.D.   On: 02/28/2021 08:36   CT HEAD CODE STROKE WO CONTRAST  Result Date: 02/25/2021 CLINICAL DATA:  Code  stroke.  EXAM: CT HEAD WITHOUT CONTRAST TECHNIQUE: Contiguous axial images were obtained from the base of the skull through the vertex without intravenous contrast. COMPARISON:  None. FINDINGS: Brain: No acute intracranial hemorrhage, mass effect, or edema. Gray-white differentiation is preserved. Age-indeterminate small vessel infarct of the left gangliocapsular region. Additional patchy low-density in the supratentorial white matter is nonspecific but may reflect mild chronic microvascular ischemic changes. Ventricles and sulci are within normal limits in size and configuration. No extra-axial collection. Vascular: No hyperdense vessel. Mild intracranial atherosclerotic calcification at the skull base. Skull: Unremarkable. Sinuses/Orbits: Aerated. Other: Mastoid air cells are clear. ASPECTS (Alberta Stroke Program Early CT Score) - Ganglionic level infarction (caudate, lentiform nuclei, internal capsule, insula, M1-M3 cortex): 7 - Supraganglionic infarction (M4-M6 cortex): 3 Total score (0-10 with 10 being normal): 10 IMPRESSION: There is no acute intracranial hemorrhage or evidence of acute infarction. ASPECT score is 10. Age-indeterminate small vessel infarct of the left gangliocapsular region. These results were called by telephone at the time of interpretation on 02/25/2021 at 2:37 pm to provider Tanda Rockers , who verbally acknowledged these results. Electronically Signed   By: Guadlupe Spanish M.D.   On: 02/25/2021 14:39   VAS [place] LOWER EXTREMITY VENOUS (DVT)  Result Date: 02/26/2021  Lower Venous DVT Study Patient Name:  NEKAYLA HEIDER  Date of Exam:   02/26/2021 Medical Rec #: [id number]      Accession #:    [id number] Date of Birth: 1946/04/01     Patient Gender: F Patient Age:   69 years Exam Location:  [facility] Procedure:      VAS [place] LOWER EXTREMITY VENOUS (DVT) Referring Phys: Scheryl Marten Ishitha Roper --------------------------------------------------------------------------------  Indications: Stroke.  Comparison  Study: No prior study Performing Technologist: Gertie Fey MHA, RDMS, RVT, RDCS  Examination Guidelines: A complete evaluation includes B-mode imaging, spectral Doppler, color Doppler, and power Doppler as needed of all accessible portions of each vessel. Bilateral testing is considered an integral part of a complete examination. Limited examinations for reoccurring indications may be performed as noted. The reflux portion of the exam is performed with the patient in reverse Trendelenburg.  +---------+---------------+---------+-----------+----------+--------------+ RIGHT    CompressibilityPhasicitySpontaneityPropertiesThrombus Aging +---------+---------------+---------+-----------+----------+--------------+ CFV      Full           Yes      Yes                                 +---------+---------------+---------+-----------+----------+--------------+ SFJ      Full                                                        +---------+---------------+---------+-----------+----------+--------------+ FV Prox  Full                                                        +---------+---------------+---------+-----------+----------+--------------+ FV Mid   Full                                                        +---------+---------------+---------+-----------+----------+--------------+  FV DistalFull                                                        +---------+---------------+---------+-----------+----------+--------------+ PFV      Full                                                        +---------+---------------+---------+-----------+----------+--------------+ POP      Full           Yes      Yes                                 +---------+---------------+---------+-----------+----------+--------------+ PTV      Full                                                        +---------+---------------+---------+-----------+----------+--------------+  PERO     Full                                                        +---------+---------------+---------+-----------+----------+--------------+   +---------+---------------+---------+-----------+----------+--------------+ LEFT     CompressibilityPhasicitySpontaneityPropertiesThrombus Aging +---------+---------------+---------+-----------+----------+--------------+ CFV      Full           Yes      Yes                                 +---------+---------------+---------+-----------+----------+--------------+ SFJ      Full                                                        +---------+---------------+---------+-----------+----------+--------------+ FV Prox  Full                                                        +---------+---------------+---------+-----------+----------+--------------+ FV Mid   Full                                                        +---------+---------------+---------+-----------+----------+--------------+ FV DistalFull                                                        +---------+---------------+---------+-----------+----------+--------------+  PFV      Full                                                        +---------+---------------+---------+-----------+----------+--------------+ POP      Full           Yes      Yes                                 +---------+---------------+---------+-----------+----------+--------------+ PTV      Full                                                        +---------+---------------+---------+-----------+----------+--------------+ PERO     Full                                                        +---------+---------------+---------+-----------+----------+--------------+     Summary: RIGHT: - There is no evidence of deep vein thrombosis in the lower extremity.  - No cystic structure found in the popliteal fossa.  LEFT: - There is no evidence of deep vein thrombosis  in the lower extremity.  - No cystic structure found in the popliteal fossa.  *See table(s) above for measurements and observations. Electronically signed by Waverly Ferrari MD on 02/26/2021 at 4:59:49 PM.    Final    CT ANGIO HEAD NECK W WO CM W PERF (CODE STROKE)  Result Date: 02/25/2021 CLINICAL DATA:  Neuro deficit, acute, stroke suspected EXAM: CT ANGIOGRAPHY HEAD AND NECK CT PERFUSION BRAIN TECHNIQUE: Multidetector CT imaging of the head and neck was performed using the standard protocol during bolus administration of intravenous contrast. Multiplanar CT image reconstructions and MIPs were obtained to evaluate the vascular anatomy. Carotid stenosis measurements (when applicable) are obtained utilizing NASCET criteria, using the distal internal carotid diameter as the denominator. Multiphase CT imaging of the brain was performed following IV bolus contrast injection. Subsequent parametric perfusion maps were calculated using RAPID software. CONTRAST:  OMNIPAQUE IOHEXOL 350 MG/ML SOLN COMPARISON:  None. FINDINGS: CTA NECK Aortic arch: Trace calcified plaque along the included arch. Great vessel origins are patent. Right carotid system: Patent.  No stenosis. Left carotid system: Patent. Trace mixed plaque at the bifurcation. No stenosis. Vertebral arteries: Patent and codominant.  No stenosis. Skeleton: Mild cervical spine degenerative changes. Degenerative changes at the temporomandibular joints. Other neck: Unremarkable. Upper chest: Centrilobular emphysema. Nodular but somewhat tubular opacities at the left apex probably reflect impacted airways. Review of the MIP images confirms the above findings CTA HEAD Anterior circulation: Intracranial internal carotid arteries are patent with mild calcified plaque. Left M1 MCA is patent. There is occlusion of a distal left M2 MCA branch within the posterior sylvian fissure. This may reconstitute more distally. Anterior and right middle cerebral arteries  are patent. Posterior circulation: Intracranial vertebral arteries are patent. Basilar artery is patent. Major cerebellar artery origins are patent. A left posterior communicating artery is  present. Posterior cerebral arteries are patent. Venous sinuses: Patent as allowed by contrast bolus timing. Review of the MIP images confirms the above findings CT Brain Perfusion Findings: CBF (<30%) Volume: 65mL Perfusion (Tmax>6.0s) volume: 91mL Mismatch Volume: 9mL Infarction Location: Area of infarction and most territory at risk within the posterior left MCA territory. There is some artifactual calculated territory at risk within the inferior frontal lobes. 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 May reconstitute more distally. Perfusion imaging demonstrates 3 mL of core infarction and 18 mL of penumbra within the posterior left MCA territory. The penumbra volume is overestimated as there is some artifact in the inferior frontal lobes included in the number. No occlusion or hemodynamically significant stenosis in the neck. These results were provided by telephone at the time of interpretation on 02/25/2021 at 3:55 pm to provider [facility] , who verbally acknowledged these results. Electronically Signed   By: Guadlupe Spanish M.D.   On: 02/25/2021 16:01     PHYSICAL EXAM  Temp:  [97.8 F (36.6 C)-98.7 F (37.1 C)] 98.7 F (37.1 C) (10/21 0800) Pulse Rate:  [65-144] 105 (10/21 1100) Resp:  [14-29] 22 (10/21 1100) BP: (102-141)/(57-124) 108/95 (10/21 1100) SpO2:  [90 %-100 %] 99 % (10/21 1100)  General - Well nourished, well developed, in no apparent distress.  Ophthalmologic - fundi not visualized due to noncooperation.  Cardiovascular - irregularly irregular heart rate and rhythm, with RVR  Neuro - awake, alert, global aphasia, not following commands, able to tell me her first name and say "I am OK, thank you very much", not able to name and repeat. No gaze palsy,  tracking bilaterally, not consistently blinking to visual threat bilaterally, PERRL. Slight right nasolabial fold flatterning. Tongue protrusion not cooperative. Bilateral UEs 5/5, no drift. Bilaterally LEs 5/5, no drift. Sensation, coordination not cooperative and gait not tested.    ASSESSMENT/PLAN Ms. Nobuko Gsell is a 75 y.o. female with history of HTN, HLD admitted for aphasia. 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  MRI large inferior MCA infarct with petechial hemorrhage 2D Echo EF 55-60% LE venous doppler no DVT LDL 63 HgbA1c 5.5 UDS neg SCDs for VTE prophylaxis No antithrombotic prior to admission, now on heparin IV.   Ongoing aggressive stroke risk factor management Therapy recommendations:  CIR Disposition:  pending  Afib RVR Continue afib RVR  New diagnosis On cardizem po 60mg  q6 amiodarone drip transition to p.o. on heparin IV  Cardiology planning for TEE and possible cardioversion this afternoon  Hypertension Stable Off cleviprex On cardizem BP goal < 160 given on heparin IV with petechial hemorrhage Long term BP goal normotensive  Hyperlipidemia Home meds:  lipitor 10  LDL 63, goal < 70 Now on lipitor 10 Continue statin at discharge  Other Stroke Risk Factors Advanced age Obesity, Body mass index is 31.15 kg/m.   Other Active Problems Leukocytosis WBC 12.2  Hospital day # 4  This patient is critically ill due to continued A. fib RVR, large left MCA stroke status post TNKase and IR and at significant risk of neurological worsening, death form heart failure, cardiac rest, recurrent stroke, hemorrhagic version,  bleeding  from heparin. This patient's care requires constant monitoring of vital signs, hemodynamics, respiratory and cardiac monitoring, review of multiple databases, neurological assessment, discussion with family, other specialists and medical decision making of high complexity. I spent 35 minutes of neurocritical care time in the care of this patient. I had long discussion with son at bedside, updated pt current condition, treatment plan and potential prognosis, and answered all the questions.  He expressed understanding and appreciation.  I also discussed with cardiology.   Marvel Plan, MD PhD Stroke Neurology 03/01/2021 11:44 AM    To contact Stroke Continuity provider, please refer to [email or web address]. After hours, contact General Neurology

## 2021-03-01 NOTE — Progress Notes (Signed)
ANTICOAGULATION CONSULT NOTE  Pharmacy Consult for heparin Indication: atrial fibrillation  Allergies  Allergen Reactions   Aspirin     Other reaction(s): Bleeding   Penicillins Hives and Rash   Lisinopril Cough    Patient Measurements: Height: 5\' 7"  (170.2 cm) Weight: 90.2 kg (198 lb 14.4 oz) IBW/kg (Calculated) : 61.6  Vital Signs: Temp: 97.8 F (36.6 C) (10/21 0400) Temp Source: Oral (10/21 0400) BP: 117/80 (10/21 0800) Pulse Rate: 78 (10/21 0800)  Labs: Recent Labs    02/28/21 0356 02/28/21 1350 03/01/21 0325  HGB 11.8*  --  10.7*  HCT 37.9  --  34.1*  PLT 228  --  189  LABPROT  --   --  14.0  INR  --   --  1.1  HEPARINUNFRC 0.55 0.48 0.59  CREATININE 0.80  --  0.83     Estimated Creatinine Clearance: 67.5 mL/min (by C-G formula based on SCr of 0.83 mg/dL).   Medical History: History reviewed. No pertinent past medical history.  Medications:  Medications Prior to Admission  Medication Sig Dispense Refill Last Dose   albuterol (PROVENTIL) (2.5 MG/3ML) 0.083% nebulizer solution Take 2.5 mg by nebulization every 4 (four) hours as needed for wheezing.   unknown   atorvastatin (LIPITOR) 10 MG tablet Take 10 mg by mouth daily.   unknown   carboxymethylcellulose (REFRESH PLUS) 0.5 % SOLN Place 1 drop into both eyes 4 (four) times daily as needed (dry eyes).   unknown   DULERA 100-5 MCG/ACT AERO Inhale 2 puffs into the lungs 2 (two) times daily.   unknown   losartan (COZAAR) 25 MG tablet Take 25 mg by mouth daily.   unknown   Multiple Vitamin (MULTIVITAMIN) tablet Take 1 tablet by mouth daily.   unknown   omeprazole (PRILOSEC) 20 MG capsule Take 20 mg by mouth daily.   unknown   tiotropium (SPIRIVA HANDIHALER) 18 MCG inhalation capsule Place 18 mcg into inhaler and inhale daily.   unknown    Assessment: 26 YOF who presents with L MCA stroke s/p tenecteplase developed new onset Afib RVR. Pharmacy consulted to start IV heparin per stroke protocol.   Heparin  level this AM is supratherapeutic at 0.59. H/H down, Plt wnl. Per RN, no s/s of overt bleeding noted.   Goal of Therapy:  Heparin level 0.3-0.5 units/ml Monitor platelets by anticoagulation protocol: Yes   Plan:  -Decrease heparin to 1400 units/h -F/u 8 hr HL and monitor for s/s of bleeding  -Possible TEE cardioversion planned today.  -F/u transition to DOAC post procedure   60, PharmD., BCPS, BCCCP Clinical Pharmacist Please refer to [facility] for unit-specific pharmacist

## 2021-03-01 NOTE — Progress Notes (Signed)
ANTICOAGULATION CONSULT NOTE  Pharmacy Consult for heparin Indication: atrial fibrillation  Allergies  Allergen Reactions   Aspirin     Other reaction(s): Bleeding   Penicillins Hives and Rash   Lisinopril Cough    Patient Measurements: Height: 5\' 7"  (170.2 cm) Weight: 90.2 kg (198 lb 14.4 oz) IBW/kg (Calculated) : 61.6  Vital Signs: Temp: 97.7 F (36.5 C) (10/21 1501) Temp Source: Tympanic (10/21 1501) BP: 129/85 (10/21 1600) Pulse Rate: 128 (10/21 1700)  Labs: Recent Labs    02/28/21 0356 02/28/21 1350 03/01/21 0325 03/01/21 1642  HGB 11.8*  --  10.7*  --   HCT 37.9  --  34.1*  --   PLT 228  --  189  --   LABPROT  --   --  14.0  --   INR  --   --  1.1  --   HEPARINUNFRC 0.55 0.48 0.59 0.58  CREATININE 0.80  --  0.83  --      Estimated Creatinine Clearance: 67.5 mL/min (by C-G formula based on SCr of 0.83 mg/dL).   Medical History: History reviewed. No pertinent past medical history.  Medications:  Medications Prior to Admission  Medication Sig Dispense Refill Last Dose   albuterol (PROVENTIL) (2.5 MG/3ML) 0.083% nebulizer solution Take 2.5 mg by nebulization every 4 (four) hours as needed for wheezing.   unknown   atorvastatin (LIPITOR) 10 MG tablet Take 10 mg by mouth daily.   unknown   carboxymethylcellulose (REFRESH PLUS) 0.5 % SOLN Place 1 drop into both eyes 4 (four) times daily as needed (dry eyes).   unknown   DULERA 100-5 MCG/ACT AERO Inhale 2 puffs into the lungs 2 (two) times daily.   unknown   losartan (COZAAR) 25 MG tablet Take 25 mg by mouth daily.   unknown   Multiple Vitamin (MULTIVITAMIN) tablet Take 1 tablet by mouth daily.   unknown   omeprazole (PRILOSEC) 20 MG capsule Take 20 mg by mouth daily.   unknown   tiotropium (SPIRIVA HANDIHALER) 18 MCG inhalation capsule Place 18 mcg into inhaler and inhale daily.   unknown    Assessment: 57 YOF who presents with L MCA stroke s/p tenecteplase developed new onset Afib RVR. Pharmacy consulted  to start IV heparin per stroke protocol.   10/21 Heparin level 0.58 (on 1400 units/hr) No signs/symptoms of bleed   TEE cardioversion completed 10/21   Goal of Therapy:  Heparin level 0.3-0.5 units/ml Monitor platelets by anticoagulation protocol: Yes   Plan:  -Decrease heparin to 1250 units/h (12.5 ml/hr) -Heparin level @0200  -Monitor for s/s of bleeding  -F/u transition to DOAC in the AM  Thank you for allowing pharmacy to be a part of this patient's care.  11/21, PharmD Clinical Pharmacist  Please check AMION for all [facility] Pharmacy numbers After 10:00 PM, call Main Pharmacy [phone]

## 2021-03-01 NOTE — Anesthesia Postprocedure Evaluation (Signed)
Anesthesia Post Note  Patient: Michelle Dean  Procedure(s) Performed: TRANSESOPHAGEAL ECHOCARDIOGRAM (TEE) CARDIOVERSION BUBBLE STUDY     Patient location during evaluation: Endoscopy Anesthesia Type: General Level of consciousness: awake Pain management: pain level controlled Vital Signs Assessment: post-procedure vital signs reviewed and stable Respiratory status: spontaneous breathing Cardiovascular status: stable Postop Assessment: no apparent nausea or vomiting Anesthetic complications: no   No notable events documented.  Last Vitals:  Vitals:   03/01/21 1501 03/01/21 1511  BP: (!) 175/132 (!) 108/40  Pulse: (!) 59 (!) 104  Resp: (!) 24 (!) 28  Temp: 36.5 C   SpO2: (!) 88% (!) 89%    Last Pain:  Vitals:   03/01/21 1511  TempSrc:   PainSc: 0-No pain                 Lleyton Byers

## 2021-03-01 NOTE — Progress Notes (Signed)
Speech Language Pathology Treatment: Cognitive-Linquistic  Patient Details Name: Michelle Dean MRN: [id number] DOB: [birth year] Today's Date: 03/01/2021 Time: 2297-9892 SLP Time Calculation (min) (ACUTE ONLY): 32 min  Assessment / Plan / Recommendation Clinical Impression  Pt seen at bedside for skilled ST intervention targeting goals for improved language skills. Son was in attendance for part of the session. Pt was awake and alert, cooperative with unfamiliar therapist. Pt continues to be fluent, making appropriate statements and asking questions. She continues to be unaware of her deficits.  Pt requires max multimodal cues to name common objects and follow simple verbal directions, the accuracy of which is inconsistent. Pt would benefit from continued ST intervention to maximize communicative effectiveness, per POC.  Diet tolerance not assessed at this time, as pt is currently NPO for a procedure this afternoon.   HPI HPI: 75 y.o. female with a PMHx of COPD, HTN and HLD presented for evaluation of acute onset confusion and aphasia. 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      SLP Plan  Continue with current plan of care      Recommendations for follow up therapy are one component of a multi-disciplinary discharge planning process, led by the attending physician.  Recommendations may be updated based on patient status, additional functional criteria and insurance authorization.    Recommendations   Continued ST intervention after acute hospital stay                SLP Visit Diagnosis: Aphasia (R47.01) Frontal lobe and executive function deficit following: Cerebral infarction Plan: Continue with current plan of care       GO              Katalina Magri B. Murvin Natal, [facility], CCC-SLP Speech Language Pathologist Office: [phone]  Leigh Aurora  03/01/2021, 12:40 PM

## 2021-03-02 ENCOUNTER — Inpatient Hospital Stay (HOSPITAL_COMMUNITY): Payer: Medicare Other

## 2021-03-02 DIAGNOSIS — I4892 Unspecified atrial flutter: Secondary | ICD-10-CM

## 2021-03-02 DIAGNOSIS — I10 Essential (primary) hypertension: Secondary | ICD-10-CM

## 2021-03-02 LAB — BASIC METABOLIC PANEL
Anion gap: 8 (ref 5–15)
Anion gap: 10 (ref 5–15)
BUN: 13 mg/dL (ref 8–23)
BUN: 13 mg/dL (ref 8–23)
CO2: 26 mmol/L (ref 22–32)
CO2: 26 mmol/L (ref 22–32)
Calcium: 8.6 mg/dL — ABNORMAL LOW (ref 8.9–10.3)
Calcium: 8.7 mg/dL — ABNORMAL LOW (ref 8.9–10.3)
Chloride: 104 mmol/L (ref 98–111)
Chloride: 103 mmol/L (ref 98–111)
Creatinine, Ser: 0.97 mg/dL (ref 0.44–1.00)
Creatinine, Ser: 1 mg/dL (ref 0.44–1.00)
GFR, Estimated: >60 mL/min (ref >60)
GFR, Estimated: 59 mL/min — ABNORMAL LOW (ref >60)
Glucose, Bld: 133 mg/dL — ABNORMAL HIGH (ref 70–99)
Glucose, Bld: 131 mg/dL — ABNORMAL HIGH (ref 70–99)
Potassium: 3.6 mmol/L (ref 3.5–5.1)
Potassium: 3.9 mmol/L (ref 3.5–5.1)
Sodium: 138 mmol/L (ref 135–145)
Sodium: 139 mmol/L (ref 135–145)

## 2021-03-02 LAB — CBC
HCT: 34.2 % — ABNORMAL LOW (ref 36.0–46.0)
Hemoglobin: 10.8 g/dL — ABNORMAL LOW (ref 12.0–15.0)
MCH: 30.1 pg (ref 26.0–34.0)
MCHC: 31.6 g/dL (ref 30.0–36.0)
MCV: 95.3 fL (ref 80.0–100.0)
Platelets: 203 10*3/uL (ref 150–400)
RBC: 3.59 MIL/uL — ABNORMAL LOW (ref 3.87–5.11)
RDW: 14.1 % (ref 11.5–15.5)
WBC: 23.6 10*3/uL — ABNORMAL HIGH (ref 4.0–10.5)
nRBC: 0 % (ref 0.0–0.2)

## 2021-03-02 LAB — HEPARIN LEVEL (UNFRACTIONATED)
Heparin Unfractionated: 0.36 IU/mL (ref 0.30–0.70)
Heparin Unfractionated: 0.27 IU/mL — ABNORMAL LOW (ref 0.30–0.70)

## 2021-03-02 LAB — MAGNESIUM: Magnesium: 1.5 mg/dL — ABNORMAL LOW (ref 1.7–2.4)

## 2021-03-02 MED ORDER — DIGOXIN 0.25 MG/ML IJ SOLN
0.2500 mg | Freq: Once | INTRAMUSCULAR | Status: AC
  Administered 2021-03-02: 0.25 mg via INTRAVENOUS
  Filled 2021-03-02: qty 2

## 2021-03-02 MED ORDER — SODIUM CHLORIDE 0.9 % IV BOLUS
1000.0000 mL | Freq: Once | INTRAVENOUS | Status: AC
  Administered 2021-03-02: 1000 mL via INTRAVENOUS

## 2021-03-02 MED ORDER — MAGNESIUM SULFATE 4 GM/100ML IV SOLN
4.0000 g | Freq: Once | INTRAVENOUS | Status: AC
  Administered 2021-03-02: 4 g via INTRAVENOUS
  Filled 2021-03-02: qty 100

## 2021-03-02 MED ORDER — METOPROLOL TARTRATE 5 MG/5ML IV SOLN: 5.0000 mg | Freq: Once | INTRAVENOUS | Status: DC

## 2021-03-02 MED ORDER — METOPROLOL TARTRATE 25 MG PO TABS
25.0000 mg | ORAL_TABLET | Freq: Two times a day (BID) | ORAL | Status: DC
  Administered 2021-03-02: 25 mg via ORAL
  Filled 2021-03-02: qty 1

## 2021-03-02 MED ORDER — AMIODARONE HCL 200 MG PO TABS
200.0000 mg | ORAL_TABLET | Freq: Two times a day (BID) | ORAL | Status: DC
  Administered 2021-03-02 – 2021-03-06 (×10): 200 mg via ORAL
  Filled 2021-03-02 (×10): qty 1

## 2021-03-02 MED ORDER — DOXYCYCLINE HYCLATE 100 MG PO TABS
100.0000 mg | ORAL_TABLET | Freq: Two times a day (BID) | ORAL | Status: AC
  Administered 2021-03-02 – 2021-03-06 (×10): 100 mg via ORAL
  Filled 2021-03-02 (×11): qty 1

## 2021-03-02 MED ORDER — MIDODRINE HCL 5 MG PO TABS
10.0000 mg | ORAL_TABLET | Freq: Three times a day (TID) | ORAL | Status: DC
  Administered 2021-03-02 – 2021-03-06 (×13): 10 mg via ORAL
  Filled 2021-03-02 (×13): qty 2

## 2021-03-02 MED ORDER — POTASSIUM CHLORIDE CRYS ER 20 MEQ PO TBCR
40.0000 meq | EXTENDED_RELEASE_TABLET | Freq: Once | ORAL | Status: AC
  Administered 2021-03-02: 40 meq via ORAL
  Filled 2021-03-02: qty 2

## 2021-03-02 MED ORDER — SODIUM CHLORIDE 0.9 % IV SOLN: 250.0000 mL | INTRAVENOUS | Status: DC

## 2021-03-02 MED ORDER — APIXABAN 5 MG PO TABS
5.0000 mg | ORAL_TABLET | Freq: Two times a day (BID) | ORAL | Status: DC
  Administered 2021-03-02 – 2021-03-07 (×10): 5 mg via ORAL
  Filled 2021-03-02 (×10): qty 1

## 2021-03-02 MED ORDER — METOPROLOL TARTRATE 5 MG/5ML IV SOLN
2.5000 mg | Freq: Once | INTRAVENOUS | Status: AC
  Administered 2021-03-02: 2.5 mg via INTRAVENOUS
  Filled 2021-03-02: qty 5

## 2021-03-02 MED ORDER — DIGOXIN 125 MCG PO TABS
0.1250 mg | ORAL_TABLET | Freq: Every day | ORAL | Status: DC
  Administered 2021-03-03 – 2021-03-04 (×2): 0.125 mg via ORAL
  Filled 2021-03-02 (×3): qty 1

## 2021-03-02 MED ORDER — PHENYLEPHRINE HCL-NACL 20-0.9 MG/250ML-% IV SOLN
25.0000 ug/min | INTRAVENOUS | Status: DC
  Administered 2021-03-02: 25 ug/min via INTRAVENOUS
  Filled 2021-03-02: qty 250

## 2021-03-02 NOTE — Progress Notes (Signed)
eLink Physician-Brief Progress Note Patient Name: Michelle Dean DOB: 1945-12-03 MRN: [id number]   Date of Service  03/02/2021  HPI/Events of Note  Hypotension - BP - 80/57. Given AFIB tonight, will start Phenylephrine IV infusion. No CVL or CVP.   eICU Interventions  Plan: Phenylephrine IV infusion via PIV. Titrate to MAP >- 65.     Intervention Category Major Interventions: Hypotension - evaluation and management  Deaisa Merida Dennard Nip 03/02/2021, 4:48 AM

## 2021-03-02 NOTE — Progress Notes (Signed)
ANTICOAGULATION CONSULT NOTE  Pharmacy Consult for heparin Indication: atrial fibrillation  Allergies  Allergen Reactions   Aspirin     Other reaction(s): Bleeding   Penicillins Hives and Rash   Lisinopril Cough    Patient Measurements: Height: 5\' 7"  (170.2 cm) Weight: 90.2 kg (198 lb 14.4 oz) IBW/kg (Calculated) : 61.6  Vital Signs: Temp: 97.7 F (36.5 C) (10/22 0400) Temp Source: Oral (10/22 0400) BP: 111/79 (10/22 0600) Pulse Rate: 88 (10/22 0500)  Labs: Recent Labs    02/28/21 0356 02/28/21 1350 03/01/21 0325 03/01/21 1642 03/02/21 0046 03/02/21 0231  HGB 11.8*  --  10.7*  --   --  10.8*  HCT 37.9  --  34.1*  --   --  34.2*  PLT 228  --  189  --   --  203  LABPROT  --   --  14.0  --   --   --   INR  --   --  1.1  --   --   --   HEPARINUNFRC 0.55   < > 0.59 0.58  --  0.36  CREATININE 0.80  --  0.83  --  0.97 1.00   < > = values in this interval not displayed.     Estimated Creatinine Clearance: 56 mL/min (by C-G formula based on SCr of 1 mg/dL).   Medical History: History reviewed. No pertinent past medical history.  Medications:  Medications Prior to Admission  Medication Sig Dispense Refill Last Dose   albuterol (PROVENTIL) (2.5 MG/3ML) 0.083% nebulizer solution Take 2.5 mg by nebulization every 4 (four) hours as needed for wheezing.   unknown   atorvastatin (LIPITOR) 10 MG tablet Take 10 mg by mouth daily.   unknown   carboxymethylcellulose (REFRESH PLUS) 0.5 % SOLN Place 1 drop into both eyes 4 (four) times daily as needed (dry eyes).   unknown   DULERA 100-5 MCG/ACT AERO Inhale 2 puffs into the lungs 2 (two) times daily.   unknown   losartan (COZAAR) 25 MG tablet Take 25 mg by mouth daily.   unknown   Multiple Vitamin (MULTIVITAMIN) tablet Take 1 tablet by mouth daily.   unknown   omeprazole (PRILOSEC) 20 MG capsule Take 20 mg by mouth daily.   unknown   tiotropium (SPIRIVA HANDIHALER) 18 MCG inhalation capsule Place 18 mcg into inhaler and inhale  daily.   unknown    Assessment: 59 YOF who presents with L MCA stroke s/p tenecteplase developed new onset Afib RVR. Pharmacy consulted to start IV heparin per stroke protocol.   Heparin level this afternoon is slightly SUBtherapeutic (HL 0.27 << 0.36, goal of 0.3-0.5). CBC stable - no bleeding noted.   The plan is to transition to Apixaban later this afternoon. Since Heparin close to goal range - will continue current rate to bridge to Apixaban start.   Goal of Therapy:  Heparin level 0.3-0.5 units/ml Monitor platelets by anticoagulation protocol: Yes   Plan:  - Continue Heparin at 1250 units/hr - stop at 1759 - Start Apixaban 5 mg po bid - first dose at 1800 - Pharmacy will continue to monitor peripherally for bleeding and/or dose adjustments  Thank you for allowing pharmacy to be a part of this patient's care.  60, PharmD, BCPS Clinical Pharmacist Clinical phone for 03/02/2021: [phone] 03/02/2021 2:02 PM   **Pharmacist phone directory can now be found on [email or web address] ([email or web address]).  Listed under [facility].

## 2021-03-02 NOTE — Progress Notes (Signed)
Subjective:  Michelle Dean is a 75 y.o. female whose past medical history and cardiac risk factors include: Recent stroke (M2/M3 occlusion) s/p TNKase followed by partial recannulation with reocclusion followed by mechanical thrombolysis achieving TICI 2C revascularization, HTN, HLD, COPD on intermittent oxygen support.  This morning patient is very coherent and appropriate with regard to expressive aphasia.  Denies any specific complaints today and states that she feels well.  Had hypotension last night needing start of Neo-Synephrine.  It is of this morning but blood pressure continues to remain soft.  Intake/Output from previous day:  I/O last 3 completed shifts: In: 2653.8 [P.O.:240; I.V.:1312.7; IV Piggyback:1101.1] Out: -  Total I/O In: 150.5 [I.V.:150.5] Out: -   Blood pressure 115/70, pulse (!) 113, temperature 97.7 F (36.5 C), temperature source Oral, resp. rate 20, height '5\' 7"'  (1.702 m), weight 90.2 kg, SpO2 (!) 89 %. Physical Exam Constitutional:      Appearance: She is obese.  HENT:     Mouth/Throat:     Mouth: Mucous membranes are moist.  Eyes:     Extraocular Movements: Extraocular movements intact.  Neck:     Vascular: No carotid bruit or JVD.  Cardiovascular:     Rate and Rhythm: Normal rate and regular rhythm.     Pulses: Intact distal pulses.     Heart sounds: Normal heart sounds. No murmur heard.   No gallop.  Pulmonary:     Effort: Pulmonary effort is normal.     Breath sounds: Normal breath sounds.  Abdominal:     General: Bowel sounds are normal.     Palpations: Abdomen is soft.  Musculoskeletal:        General: No swelling.     Cervical back: Neck supple.  Skin:    General: Skin is warm.  Neurological:     General: No focal deficit present.  Psychiatric:        Mood and Affect: Mood normal.    Lab Results: BNP (last 3 results) No results for input(s): BNP in the last 8760 hours.  ProBNP (last 3 results) No results for input(s): PROBNP in  the last 8760 hours. BMP Latest Ref Rng & Units 03/02/2021 03/02/2021 03/01/2021  Glucose 70 - 99 mg/dL 131(H) 133(H) 109(H)  BUN 8 - 23 mg/dL '13 13 12  ' Creatinine 0.44 - 1.00 mg/dL 1.00 0.97 0.83  Sodium 135 - 145 mmol/L 139 138 140  Potassium 3.5 - 5.1 mmol/L 3.9 3.6 3.9  Chloride 98 - 111 mmol/L 103 104 107  CO2 22 - 32 mmol/L '26 26 28  ' Calcium 8.9 - 10.3 mg/dL 8.7(L) 8.6(L) 8.6(L)   Hepatic Function Latest Ref Rng & Units 02/25/2021  Total Protein 6.5 - 8.1 g/dL 6.8  Albumin 3.5 - 5.0 g/dL 3.9  AST 15 - 41 U/L 20  ALT 0 - 44 U/L 14  Alk Phosphatase 38 - 126 U/L 56  Total Bilirubin 0.3 - 1.2 mg/dL 0.6   CBC Latest Ref Rng & Units 03/02/2021 03/01/2021 02/28/2021  WBC 4.0 - 10.5 K/uL 23.6(H) 11.1(H) 12.2(H)  Hemoglobin 12.0 - 15.0 g/dL 10.8(L) 10.7(L) 11.8(L)  Hematocrit 36.0 - 46.0 % 34.2(L) 34.1(L) 37.9  Platelets 150 - 400 K/uL 203 189 228   Lipid Panel     Component Value Date/Time   CHOL 131 02/26/2021 0525   TRIG 55 02/26/2021 0525   HDL 57 02/26/2021 0525   CHOLHDL 2.3 02/26/2021 0525   VLDL 11 02/26/2021 0525   LDLCALC 63 02/26/2021 0525  Cardiac Panel (last 3 results) No results for input(s): CKTOTAL, CKMB, TROPONINI, RELINDX in the last 72 hours.  HEMOGLOBIN A1C Lab Results  Component Value Date   HGBA1C 5.5 02/26/2021   MPG 111.15 02/26/2021   TSH Recent Labs    02/27/21 1211  TSH 0.633   Imaging: DG CHEST PORT 1 VIEW  Result Date: 03/02/2021 CLINICAL DATA:  Cough EXAM: PORTABLE CHEST 1 VIEW COMPARISON:  None FINDINGS: Normal heart size and mediastinal contours. Lung volumes are low. No pleural effusion or edema. No airspace consolidation. Visualized osseous structures are unremarkable. IMPRESSION: Low lung volumes.  No signs of pneumonia. Electronically Signed   By: Kerby Moors M.D.   On: 03/02/2021 09:45    Cardiac Studies:  EKG:  EKG 03/01/2021: Atrial fibrillation with rapid ventricular response at the rate of 105 bpm, normal axis,  incomplete right bundle branch block.  Echocardiogram 02/26/2021:  1. Left ventricular ejection fraction, by estimation, is 55 to 60%. The left ventricle has normal function. The left ventricle has no regional wall motion abnormalities. Left ventricular diastolic parameters are consistent with Grade II diastolic  dysfunction (pseudonormalization).  2. Right ventricular systolic function is normal. The right ventricular size is mildly enlarged. Tricuspid regurgitation signal is inadequate for assessing PA pressure.  3. Left atrial size was mildly dilated.  4. Right atrial size was mildly dilated.  5. The mitral valve is normal in structure. No evidence of mitral valve regurgitation. No evidence of mitral stenosis.  6. The aortic valve is tricuspid. Aortic valve regurgitation is not visualized. Mild aortic valve sclerosis is present, with no evidence of aortic valve stenosis.  7. The inferior vena cava is dilated in size with >50% respiratory variability, suggesting right atrial pressure of 8 mmHg.  Transesophageal echocardiogram (TEE) : Preliminary report 03/01/21   Sedation: See anesthesia records.    TEE was performed without complications   LV: Low normal 50-55%. RV: Grossly normal structure and function.  LA: Visually dilated. Spontaneous echo contrast was not present.  No thrombus present. Left atrial appendage: Spontaneous echo contrast was not present.  No thrombus present. Inter atrial septum is intact but redundant, bubble study negative for atrial level shunting. RA: Grossly normal.    MV: mild regurgitation,no stenosis, no vegetation noted.  TV: mild regurgitation,no stenosis, no vegetation noted. AV: no regurgitation,no stenosis, no vegetation noted.   PV: no regurgitation, no stenosis, no vegetation noted.   Thoracic and ascending aorta: Plaque present.  Direct-current cardioversion 03/01/2021: Unsuccessful.  Transient sinus.   Scheduled Meds:  atorvastatin  10 mg Oral  Daily   Chlorhexidine Gluconate Cloth  6 each Topical Daily   magnesium oxide  400 mg Oral Daily   mometasone-formoterol  2 puff Inhalation BID   multivitamin with minerals  1 tablet Oral Daily   umeclidinium bromide  1 puff Inhalation Daily   Continuous Infusions:  sodium chloride 10 mL/hr at 03/02/21 0535   amiodarone 30 mg/hr (03/02/21 0900)   heparin 1,250 Units/hr (03/02/21 0900)   phenylephrine (NEO-SYNEPHRINE) Adult infusion 35 mcg/min (03/02/21 0900)   PRN Meds:.acetaminophen **OR** acetaminophen (TYLENOL) oral liquid 160 mg/5 mL **OR** acetaminophen, albuterol, labetalol **AND** [DISCONTINUED] clevidipine, senna-docusate  Assessment/Plan:  Richanda Darin is a 75 y.o. female whose past medical history and cardiac risk factors include: Recent stroke (M2/M3 occlusion) s/p TNKase followed by partial recannulation with reocclusion followed by mechanical thrombolysis achieving TICI 2C revascularization, HTN, HLD, COPD on intermittent oxygen support.  1.  Persistent atrial fibrillation with rapid ventricular response  CHA2DS2-VASc Score is 5.  Yearly risk of stroke: 7.2% (A, F, CVA).  Score of 1=0.6; 2=2.2; 3=3.2; 4=4.8; 5=7.2; 6=9.8; 7=>9.8) -(CHF; HTN; vasc disease DM,  Female = 1; Age <65 =0; 65-74 = 1,  >75 =2; stroke/embolism= 2).   2.  Iatrogenic hypotension 3.  Cardioembolic stroke with aphasia, markedly improved this morning and patient completely coherent.  Recommendation: Patient was transiently on vasopressors last night, pressure is very soft.  I will discontinue metoprolol, switch her to p.o. amiodarone as she has been adequately loaded, digitalize her and also add midodrine 10 mg p.o. 3 times daily to improve her blood pressure.  She is not in any acute decompensated heart failure.  From cardiac standpoint I would not be too concerned about elevated heart rate, would resume all rehab and given discharge planning from atrial fibrillation standpoint.  With regard to atrial  fibrillation, would recommend starting oral anticoagulants and discontinuing IV heparin.     Adrian Prows, MD, [facility] 03/02/2021, 10:08 AM Office: [phone] Fax: [phone] Pager: [phone]

## 2021-03-02 NOTE — Progress Notes (Signed)
Pharmacy: Digoxin Dosing  74 YOF to start digoxin to help with management of Afib w/ RVR. Pharmacy consulted to help with loading dose + maintenance dose.   Digoxin 0.25 mg IV x 1 ordered and given at 1200 - will repeat an additional dose x 1 in 6 hours, then plan to start a maintenance dose on 10/23 AM.   Given the interaction with amiodarone - will start with a low maintenance dose of 0.125 mg daily. Will plan to check a level at steady state in ~ 7d from initiation.  Plan - Digoxin 0.25 mg IV x 1 per MD given at 1200 - Repeat Digoxin 0.25 mg IV x 1 at 1800 - Start digoxin 0.125 mg on 10/23 AM - Will need to check levels at steady state in ~1 week  Thank you for allowing pharmacy to be a part of this patient's care.  Georgina Pillion, PharmD, BCPS Clinical Pharmacist Clinical phone for 03/02/2021: [phone] 03/02/2021 1:07 PM   **Pharmacist phone directory can now be found on [email or web address] ([email or web address]).  Listed under [facility].

## 2021-03-02 NOTE — Progress Notes (Signed)
ANTICOAGULATION CONSULT NOTE  Pharmacy Consult for heparin Indication: atrial fibrillation  Allergies  Allergen Reactions   Aspirin     Other reaction(s): Bleeding   Penicillins Hives and Rash   Lisinopril Cough    Patient Measurements: Height: 5\' 7"  (170.2 cm) Weight: 90.2 kg (198 lb 14.4 oz) IBW/kg (Calculated) : 61.6  Vital Signs: Temp: 102.1 F (38.9 C) (10/22 0000) Temp Source: Axillary (10/22 0000) BP: 83/53 (10/22 0300) Pulse Rate: 92 (10/22 0300)  Labs: Recent Labs    02/28/21 0356 02/28/21 1350 03/01/21 0325 03/01/21 1642 03/02/21 0046 03/02/21 0231  HGB 11.8*  --  10.7*  --   --  10.8*  HCT 37.9  --  34.1*  --   --  34.2*  PLT 228  --  189  --   --  203  LABPROT  --   --  14.0  --   --   --   INR  --   --  1.1  --   --   --   HEPARINUNFRC 0.55   < > 0.59 0.58  --  0.36  CREATININE 0.80  --  0.83  --  0.97  --    < > = values in this interval not displayed.     Estimated Creatinine Clearance: 57.7 mL/min (by C-G formula based on SCr of 0.97 mg/dL).   Medical History: History reviewed. No pertinent past medical history.  Medications:  Medications Prior to Admission  Medication Sig Dispense Refill Last Dose   albuterol (PROVENTIL) (2.5 MG/3ML) 0.083% nebulizer solution Take 2.5 mg by nebulization every 4 (four) hours as needed for wheezing.   unknown   atorvastatin (LIPITOR) 10 MG tablet Take 10 mg by mouth daily.   unknown   carboxymethylcellulose (REFRESH PLUS) 0.5 % SOLN Place 1 drop into both eyes 4 (four) times daily as needed (dry eyes).   unknown   DULERA 100-5 MCG/ACT AERO Inhale 2 puffs into the lungs 2 (two) times daily.   unknown   losartan (COZAAR) 25 MG tablet Take 25 mg by mouth daily.   unknown   Multiple Vitamin (MULTIVITAMIN) tablet Take 1 tablet by mouth daily.   unknown   omeprazole (PRILOSEC) 20 MG capsule Take 20 mg by mouth daily.   unknown   tiotropium (SPIRIVA HANDIHALER) 18 MCG inhalation capsule Place 18 mcg into inhaler  and inhale daily.   unknown    Assessment: 45 YOF who presents with L MCA stroke s/p tenecteplase developed new onset Afib RVR. Pharmacy consulted to start IV heparin per stroke protocol.   10/21 Heparin level 0.58 (on 1400 units/hr) No signs/symptoms of bleed   TEE cardioversion completed 10/21  10/22 AM update:  Heparin level therapeutic after rate decrease  Goal of Therapy:  Heparin level 0.3-0.5 units/ml Monitor platelets by anticoagulation protocol: Yes   Plan:  -Cont heparin at 1250 units/hr -1200 heparin level  11/22, PharmD, BCPS Clinical Pharmacist Phone: [phone]

## 2021-03-02 NOTE — Progress Notes (Signed)
2330 notified by NT temp. 102.1; given PRN tylenol, ice packs placed, blankets removed.  0045 temp 98.8; noted to be hypotensive and coughing up yellow/green sputum; eLink notified, order for chest x-ray at 0500 obtained. Neuro status unchanged.  0215 still hypotensive; order for 1000 mL bolus obtained per eLink MD. Neuro status unchanged.  0430 hypotension persists despite bolus; notified eLink. Neuro status unchanged. Order obtained for phenylephrine.

## 2021-03-02 NOTE — Progress Notes (Signed)
NAME:  Michelle Dean, MRN:  [id number], DOB:  [birth year], LOS: 5 ADMISSION DATE:  02/25/2021, CONSULTATION DATE: 02/27/2021 REFERRING MD: Dr. Thomasena Edis, CHIEF COMPLAINT: Aphasia  History of Present Illness:  Michelle Dean is a 75 y.o. female who has a PMH as outlined below.  She presented to any [facility] ED 10/17 with acute onset of confusion and expressive and receptive aphasia.  CT head was negative for hemorrhage.  She received TNKase.  CTA revealed left M2/M3 occlusion for which she was transferred to [facility] for angiogram and possible thrombectomy.   She was taken to IR and had partial recanalization with reocclusion followed by mechanical thrombolysis achieving a TICI 2C revascularization.   10/19, she had A. fib with RVR.  She was started on Cardizem however RVR persisted.  PCCM subsequently asked to see in consultation.  Pertinent  Medical History  has Acute ischemic stroke (HCC); Stroke (cerebrum) (HCC); Middle cerebral artery embolism, left; Aphasia; Atrial fibrillation (HCC); Atrial flutter with rapid ventricular response (HCC); Benign hypertension; and Mixed hyperlipidemia on their problem list.  Significant Hospital Events: Including procedures, antibiotic start and stop dates in addition to other pertinent events   10/17> admit 10/19> PCCM consult for A. fib RVR 10/21 TEE/cardioversion  Interim History / Subjective:  Unfortunately went back into A. Fib Hypotension for which she was started on phenylephrine-weaned off Cough with green and yellow secretions-chest x-ray today 10/22 no infiltrate  Objective   Blood pressure 126/86, pulse (!) 128, temperature 97.7 F (36.5 C), temperature source Oral, resp. rate (!) 21, height 5\' 7"  (1.702 m), weight 90.2 kg, SpO2 99 %.    FiO2 (%):  [32 %] 32 %   Intake/Output Summary (Last 24 hours) at 03/02/2021 1029 Last data filed at 03/02/2021 1000 Gross per 24 hour  Intake 2422.55 ml  Output --  Net 2422.55 ml   Filed Weights    02/25/21 1452 02/25/21 1514  Weight: 99.8 kg 90.2 kg    Examination: General: Not in distress HENT: Expressive aphasia Lungs: Clear breath sounds bilaterally Cardiovascular: S1-S2 appreciated Abdomen: Bowel sounds appreciated Extremities: No clubbing, no edema Neuro: Alert GU:   CTA head 10/17 > Left M2/M3 occlusion. CT 10/18 > left MCA contrast staining, hypodensity in left temporal lobe. MRI brain 10/18 > mod posterior left MCA infarct, mild to mod chronic small vessel ischemic disease. Echo 10/18 > EF 55-60%, G2DD, mildly enlarged RV/LA/RA. LE duplex 10/18 > neg. TSH normal.  Resolved Hospital Problem list     Assessment & Plan:  Left M2/M3 occlusion -S/p TNKase and partial recanalization with reocclusion followed by mechanical thrombolysis achieving a TICI 2C vascularization -Stroke team continues to follow -Speech eval for aphasia -Progressive ambulation  A. fib with RVR -New onset -Post TEE/cardioversion 10/21 -Went back into A. fib -Now on amiodarone IV, -Add beta-blockade  History of COPD -Supplemental oxygen -Continue bronchodilators  Cough with sputum production -Chest x-ray negative -Doxycycline p.o. for 7 days -New leukocytosis -Fever overnight    Best Practice (right click and "Reselect all SmartList Selections" daily)   Diet/type: Regular consistency (see orders) DVT prophylaxis: systemic heparin GI prophylaxis: N/A Lines: N/A Foley:  N/A Code Status:  full code Last date of multidisciplinary goals of care discussion [pending ]  Labs   CBC: Recent Labs  Lab 02/25/21 1441 02/26/21 0525 02/28/21 0356 03/01/21 0325 03/02/21 0231  WBC 9.9 12.2* 12.2* 11.1* 23.6*  NEUTROABS 6.2 10.2*  --   --   --   HGB 13.6 12.4 11.8*  10.7* 10.8*  HCT 43.6 38.6 37.9 34.1* 34.2*  MCV 95.8 93.9 96.9 95.5 95.3  PLT 225 222 228 189 203    Basic Metabolic Panel: Recent Labs  Lab 02/26/21 0525 02/28/21 0356 03/01/21 0325 03/02/21 0046  03/02/21 0231  NA 142 142 140 138 139  K 3.6 3.8 3.9 3.6 3.9  CL 109 109 107 104 103  CO2 25 24 28 26 26   GLUCOSE 142* 113* 109* 133* 131*  BUN 10 10 12 13 13   CREATININE 0.75 0.80 0.83 0.97 1.00  CALCIUM 8.4* 8.8* 8.6* 8.6* 8.7*  MG  --  1.6*  --  1.5*  --    GFR: Estimated Creatinine Clearance: 56 mL/min (by C-G formula based on SCr of 1 mg/dL). Recent Labs  Lab 02/26/21 0525 02/28/21 0356 03/01/21 0325 03/02/21 0231  WBC 12.2* 12.2* 11.1* 23.6*    Liver Function Tests: Recent Labs  Lab 02/25/21 1441  AST 20  ALT 14  ALKPHOS 56  BILITOT 0.6  PROT 6.8  ALBUMIN 3.9   No results for input(s): LIPASE, AMYLASE in the last 168 hours. No results for input(s): AMMONIA in the last 168 hours.  ABG No results found for: PHART, PCO2ART, PO2ART, HCO3, TCO2, ACIDBASEDEF, O2SAT   Coagulation Profile: Recent Labs  Lab 02/25/21 1441 03/01/21 0325  INR 1.0 1.1    Cardiac Enzymes: No results for input(s): CKTOTAL, CKMB, CKMBINDEX, TROPONINI in the last 168 hours.  HbA1C: Hgb A1c MFr Bld  Date/Time Value Ref Range Status  02/26/2021 05:25 AM 5.5 4.8 - 5.6 % Final    Comment:    (NOTE) Pre diabetes:          5.7%-6.4%  Diabetes:              >6.4%  Glycemic control for   <7.0% adults with diabetes     CBG: Recent Labs  Lab 02/25/21 1420  GLUCAP 99    Review of Systems:   unremarkable  Past Medical History:  She,  has no past medical history on file.   Surgical History:   Past Surgical History:  Procedure Laterality Date   IR CT HEAD LTD  02/25/2021   IR PERCUTANEOUS ART THROMBECTOMY/INFUSION INTRACRANIAL INC DIAG ANGIO  02/25/2021   RADIOLOGY WITH ANESTHESIA N/A 02/25/2021   Procedure: IR WITH ANESTHESIA;  Surgeon: Radiologist, Medication, MD;  Location: [facility] OR;  Service: Radiology;  Laterality: N/A;     Social History:      Family History:  Her family history is not on file.   Allergies Allergies  Allergen Reactions   Aspirin     Other  reaction(s): Bleeding   Penicillins Hives and Rash   Lisinopril Cough     Home Medications  Prior to Admission medications   Medication Sig Start Date End Date Taking? Authorizing Provider  albuterol (PROVENTIL) (2.5 MG/3ML) 0.083% nebulizer solution Take 2.5 mg by nebulization every 4 (four) hours as needed for wheezing. 11/21/19  Yes [provider]  atorvastatin (LIPITOR) 10 MG tablet Take 10 mg by mouth daily. 12/24/20  Yes [provider]  carboxymethylcellulose (REFRESH PLUS) 0.5 % SOLN Place 1 drop into both eyes 4 (four) times daily as needed (dry eyes).   Yes [provider]  DULERA 100-5 MCG/ACT AERO Inhale 2 puffs into the lungs 2 (two) times daily. 02/14/21  Yes [provider]  losartan (COZAAR) 25 MG tablet Take 25 mg by mouth daily. 02/21/21  Yes [provider]  Multiple Vitamin (MULTIVITAMIN)  tablet Take 1 tablet by mouth daily.   Yes [provider]  omeprazole (PRILOSEC) 20 MG capsule Take 20 mg by mouth daily. 02/04/21  Yes [provider]  tiotropium (SPIRIVA HANDIHALER) 18 MCG inhalation capsule Place 18 mcg into inhaler and inhale daily. 01/29/21  Yes [provider]    Virl Diamond, MD [place] [facility] Pager: See Loretha Stapler

## 2021-03-02 NOTE — Progress Notes (Signed)
eLink Physician-Brief Progress Note Patient Name: Michelle Dean DOB: Apr 17, 1946 MRN: [id number]   Date of Service  03/02/2021  HPI/Events of Note  Hypotension - BP = 70/55 with MAP = 62. Last LVEF = 55-60%.  eICU Interventions  Will bolus with 0.9 NaCl 1 liter IV over 1 hour now.      Intervention Category Major Interventions: Hypotension - evaluation and management  Keerthi Hazell Eugene 03/02/2021, 2:27 AM

## 2021-03-02 NOTE — Progress Notes (Addendum)
STROKE TEAM PROGRESS NOTE   INTERVAL HISTORY She is sitting in the chair in NAD. No family at bedside. She is currently on Neo @ , amio gtt and heparin gtt.  She is aphasic, unable to name and unable to repeat. TEE and cardioversion yesterday 03/01/21  however back in A Fib. She also had some hypotension overnight requiring Neo gtt. She is AFIB on monitor with HR 110-30's, BP's have been in the high 110's-120's so will change BP goal to MAP >65 and SBP >110 to help wean Neo gtt off. Therapy recommending CIR.  Neurological exam is unchanged.  Vital signs are stable. Vitals:   03/02/21 1100 03/02/21 1115 03/02/21 1200 03/02/21 1215  BP: 95/79 102/68 130/72 107/83  Pulse: 90 (!) 113 (!) 103 (!) 113  Resp: 17 (!) 26 19 (!) 26  Temp:   98.2 F (36.8 C)   TempSrc:   Axillary   SpO2: 99% 98% 97% 93%  Weight:      Height:       CBC:  Recent Labs  Lab 02/25/21 1441 02/26/21 0525 02/28/21 0356 03/01/21 0325 03/02/21 0231  WBC 9.9 12.2*   < > 11.1* 23.6*  NEUTROABS 6.2 10.2*  --   --   --   HGB 13.6 12.4   < > 10.7* 10.8*  HCT 43.6 38.6   < > 34.1* 34.2*  MCV 95.8 93.9   < > 95.5 95.3  PLT 225 222   < > 189 203   < > = values in this interval not displayed.   Basic Metabolic Panel:  Recent Labs  Lab 02/28/21 0356 03/01/21 0325 03/02/21 0046 03/02/21 0231  NA 142   < > 138 139  K 3.8   < > 3.6 3.9  CL 109   < > 104 103  CO2 24   < > 26 26  GLUCOSE 113*   < > 133* 131*  BUN 10   < > 13 13  CREATININE 0.80   < > 0.97 1.00  CALCIUM 8.8*   < > 8.6* 8.7*  MG 1.6*  --  1.5*  --    < > = values in this interval not displayed.   Lipid Panel:  Recent Labs  Lab 02/26/21 0525  CHOL 131  TRIG 55  HDL 57  CHOLHDL 2.3  VLDL 11  LDLCALC 63   HgbA1c:  Recent Labs  Lab 02/26/21 0525  HGBA1C 5.5   Urine Drug Screen:  Recent Labs  Lab 02/26/21 0256  LABOPIA NONE DETECTED  COCAINSCRNUR NONE DETECTED  LABBENZ NONE DETECTED  AMPHETMU NONE DETECTED  THCU NONE DETECTED   LABBARB NONE DETECTED    Alcohol Level  Recent Labs  Lab 02/25/21 1441  ETH <10    IMAGING past 24 hours DG CHEST PORT 1 VIEW  Result Date: 03/02/2021 CLINICAL DATA:  Cough EXAM: PORTABLE CHEST 1 VIEW COMPARISON:  None FINDINGS: Normal heart size and mediastinal contours. Lung volumes are low. No pleural effusion or edema. No airspace consolidation. Visualized osseous structures are unremarkable. IMPRESSION: Low lung volumes.  No signs of pneumonia. Electronically Signed   By: Signa Kell M.D.   On: 03/02/2021 09:45    PHYSICAL EXAM  Temp:  [97.7 F (36.5 C)-102.1 F (38.9 C)] 98.2 F (36.8 C) (10/22 1200) Pulse Rate:  [59-148] 113 (10/22 1215) Resp:  [17-39] 26 (10/22 1215) BP: (83-175)/(40-132) 107/83 (10/22 1215) SpO2:  [85 %-100 %] 93 % (10/22 1215) FiO2 (%):  [32 %]  32 % (10/22 0836)  General - Well nourished, well developed pleasant elderly Caucasian lady, in no apparent distress.  Ophthalmologic - fundi not visualized due to noncooperation.  Cardiovascular - A fib on monitor  Mental Status - awake and alert, with receptive greater than expressive aphasia, not able to follow two-step commands, unable to name objects or repeat phrases except simple ones. She attempts to read your lips.  Motor Strength - The patient's strength was normal in all extremities and pronator drift was absent.  Bulk was normal and fasciculations were absent.   Motor Tone - Muscle tone was assessed at the neck and appendages and was normal.  Sensory - Light touch, temperature/pinprick were assessed and were symmetrical.    Coordination - unable to assess due to aphasia   Gait and Station - deferred.   ASSESSMENT/PLAN [name] is a 75 y.o. female with history of HTN, HLD admitted for aphasia. 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  MRI large inferior MCA infarct with petechial hemorrhage 2D Echo EF 55-60% LE venous doppler no DVT LDL 63 HgbA1c 5.5 UDS neg SCDs for VTE prophylaxis No antithrombotic prior to admission, now on heparin IV.  We will plan to transition to Eliquis 5 to 7 days after stroke given large size Ongoing aggressive stroke risk factor management Therapy recommendations:  CIR Disposition:  pending  Afib RVR Cardiology following  Continue afib RVR  New diagnosis S/p cardioversion 10/21- unfortunately back in A fib  On cardizem discontinued  amiodarone drip transition to p.o. Started digoxin by cards today  on heparin IV  TEE 10/21 EF 50-55%. No thrombus or vegetation.   Hypertension Hypotensive overnight requiring Neo gtt currently off Off cleviprex On cardizem BP goal < 160 given on heparin IV with petechial hemorrhage Long term BP goal normotensive  Hyperlipidemia Home meds:  lipitor 10  LDL 63, goal < 70 Now on lipitor 10 Continue statin at discharge  Other Stroke Risk Factors Advanced age Obesity, Body mass index is 31.15 kg/m.    Other Active Problems Cough with sputum production  -Leukocytosis WBC 23.6, febrile, Tmax 102.1 overnight. -No fever hence will follow for now -CXR with no acute process.  - started on doxycycline for 7 days   COPD. Oxygen as needed and bronchodilators    Hospital day # 5  Gevena Mart, NP   I have personally obtained history,examined this patient, reviewed notes, independently viewed imaging studies, participated in medical decision making and plan of care.ROS completed by me personally and pertinent positives fully documented  I have made any additions or clarifications directly to the above note. Agree with note above.  Patient continues to have aphasia  and is on low-dose Neo-Synephrine to keep systolic greater than 110.  Plan to wean off this drip and mobilize out of bed.  Continue ongoing therapies.  She has elevated white count with no fever hence we will watch it for now.  Repeat CBC tomorrow blood cultures in case she spikes.  Cardiology to help with management of A. fib and rapid rate.  Continue IV heparin for stroke prevention and will transition to Eliquis 5 to 7  days poststroke due to large size of stroke.  No family available at the bedside for discussion. Discussed with Dr. Wynona Neat critical care medicine and cardiology.This patient is critically ill and at significant risk of neurological worsening, death and care requires constant monitoring of vital signs, hemodynamics,respiratory and cardiac monitoring, extensive review of multiple databases, frequent neurological assessment, discussion with family, other specialists and medical decision making of high complexity.I have made any additions or clarifications directly to the above note.This critical care time does not reflect procedure time, or teaching time or supervisory time of PA/NP/Med Resident etc but could involve care discussion time.  I spent 30 minutes of neurocritical care time  in the care of  this patient.     Delia Heady, MD Medical Director [facility] Stroke Center Pager: [phone] 03/02/2021 2:57 PM   To contact Stroke Continuity provider, please refer to [email or web address]. After hours, contact General Neurology

## 2021-03-02 NOTE — Progress Notes (Signed)
[facility] ADULT ICU REPLACEMENT PROTOCOL   The patient does apply for the [facility] Adult ICU Electrolyte Replacment Protocol based on the criteria listed below:   1.Exclusion criteria: TCTS patients, ECMO patients, and Dialysis patients 2. Is GFR >/= 30 ml/min? Yes.    Patient's GFR today is >60 3. Is SCr </= 2? Yes.   Patient's SCr is 0.97 mg/dL 4. Did SCr increase >/= 0.5 in 24 hours? No. 5.Pt's weight >40kg  Yes.   6. Abnormal electrolyte(s): K+ 3.6, mag 1.5  7. Electrolytes replaced per protocol 8.  Call MD STAT for K+ </= 2.5, Phos </= 1, or Mag </= 1 Physician:  n/a  Michelle Dean 03/02/2021 3:15 AM

## 2021-03-02 NOTE — Progress Notes (Signed)
eLink Physician-Brief Progress Note Patient Name: Michelle Dean DOB: 08-21-1945 MRN: [id number]   Date of Service  03/02/2021  HPI/Events of Note  Fever to 102.1 F. earlier. Now Temp = 98.8 F. Cough productive of green sputum.   eICU Interventions  Plan: Portable CXR at 5 AM.     Intervention Category Major Interventions: Other:  Shiraz Bastyr Dennard Nip 03/02/2021, 1:01 AM

## 2021-03-03 DIAGNOSIS — E782 Mixed hyperlipidemia: Secondary | ICD-10-CM

## 2021-03-03 LAB — CBC
HCT: 34 % — ABNORMAL LOW (ref 36.0–46.0)
Hemoglobin: 10.7 g/dL — ABNORMAL LOW (ref 12.0–15.0)
MCH: 30.1 pg (ref 26.0–34.0)
MCHC: 31.5 g/dL (ref 30.0–36.0)
MCV: 95.8 fL (ref 80.0–100.0)
Platelets: 198 10*3/uL (ref 150–400)
RBC: 3.55 MIL/uL — ABNORMAL LOW (ref 3.87–5.11)
RDW: 14 % (ref 11.5–15.5)
WBC: 19.5 10*3/uL — ABNORMAL HIGH (ref 4.0–10.5)
nRBC: 0 % (ref 0.0–0.2)

## 2021-03-03 LAB — BASIC METABOLIC PANEL
Anion gap: 9 (ref 5–15)
BUN: 15 mg/dL (ref 8–23)
CO2: 25 mmol/L (ref 22–32)
Calcium: 8.5 mg/dL — ABNORMAL LOW (ref 8.9–10.3)
Chloride: 104 mmol/L (ref 98–111)
Creatinine, Ser: 0.85 mg/dL (ref 0.44–1.00)
GFR, Estimated: >60 mL/min (ref >60)
Glucose, Bld: 103 mg/dL — ABNORMAL HIGH (ref 70–99)
Potassium: 4.3 mmol/L (ref 3.5–5.1)
Sodium: 138 mmol/L (ref 135–145)

## 2021-03-03 LAB — MAGNESIUM: Magnesium: 2 mg/dL (ref 1.7–2.4)

## 2021-03-03 MED ORDER — AMIODARONE HCL 200 MG PO TABS
200.0000 mg | ORAL_TABLET | Freq: Every day | ORAL | 1 refills | Status: DC
  Filled 2021-03-03: qty 30, 30d supply, fill #0

## 2021-03-03 NOTE — Progress Notes (Addendum)
Subjective:  Michelle Dean is a 75 y.o. female whose past medical history and cardiac risk factors include: Recent stroke (M2/M3 occlusion) s/p TNKase followed by partial recannulation with reocclusion followed by mechanical thrombolysis achieving TICI 2C revascularization, HTN, HLD, COPD on intermittent oxygen support.  Patient was noted to have on and off sensory aphasia this morning again and this has been her baseline.  Blood pressure has been stable and she has not needed any further pressor support.  Intake/Output from previous day:  I/O last 3 completed shifts: In: 1896.4 [I.V.:795.3; IV Piggyback:1101.1] Out: -  No intake/output data recorded.  Blood pressure 124/73, pulse (!) 115, temperature (!) 97.5 F (36.4 C), temperature source Oral, resp. rate (!) 27, height '5\' 7"'  (1.702 m), weight 90.2 kg, SpO2 97 %. Body mass index is 31.15 kg/m.  Vitals with BMI 03/03/2021 03/03/2021 03/03/2021  Height - - -  Weight - - -  BMI - - -  Systolic 373 428 -  Diastolic 73 94 -  Pulse - - 115    Physical Exam Constitutional:      Appearance: She is obese.  HENT:     Mouth/Throat:     Mouth: Mucous membranes are moist.  Eyes:     Extraocular Movements: Extraocular movements intact.  Neck:     Vascular: No carotid bruit or JVD.  Cardiovascular:     Rate and Rhythm: Normal rate and regular rhythm.     Pulses: Intact distal pulses.     Heart sounds: Normal heart sounds. No murmur heard.   No gallop.  Pulmonary:     Effort: Pulmonary effort is normal.     Breath sounds: Rales (Bibasilar crackles and faint expiratory wheeze.) present.  Abdominal:     General: Bowel sounds are normal.     Palpations: Abdomen is soft.  Musculoskeletal:        General: No swelling.     Cervical back: Neck supple.  Skin:    General: Skin is warm.  Neurological:     Comments: Expressive aphasia  Psychiatric:        Mood and Affect: Mood normal.    Lab Results: BNP (last 3 results) No results  for input(s): BNP in the last 8760 hours.  ProBNP (last 3 results) No results for input(s): PROBNP in the last 8760 hours. BMP Latest Ref Rng & Units 03/03/2021 03/02/2021 03/02/2021  Glucose 70 - 99 mg/dL 103(H) 131(H) 133(H)  BUN 8 - 23 mg/dL '15 13 13  ' Creatinine 0.44 - 1.00 mg/dL 0.85 1.00 0.97  Sodium 135 - 145 mmol/L 138 139 138  Potassium 3.5 - 5.1 mmol/L 4.3 3.9 3.6  Chloride 98 - 111 mmol/L 104 103 104  CO2 22 - 32 mmol/L '25 26 26  ' Calcium 8.9 - 10.3 mg/dL 8.5(L) 8.7(L) 8.6(L)   Hepatic Function Latest Ref Rng & Units 02/25/2021  Total Protein 6.5 - 8.1 g/dL 6.8  Albumin 3.5 - 5.0 g/dL 3.9  AST 15 - 41 U/L 20  ALT 0 - 44 U/L 14  Alk Phosphatase 38 - 126 U/L 56  Total Bilirubin 0.3 - 1.2 mg/dL 0.6   CBC Latest Ref Rng & Units 03/03/2021 03/02/2021 03/01/2021  WBC 4.0 - 10.5 K/uL 19.5(H) 23.6(H) 11.1(H)  Hemoglobin 12.0 - 15.0 g/dL 10.7(L) 10.8(L) 10.7(L)  Hematocrit 36.0 - 46.0 % 34.0(L) 34.2(L) 34.1(L)  Platelets 150 - 400 K/uL 198 203 189   Lipid Panel     Component Value Date/Time   CHOL 131 02/26/2021 0525  TRIG 55 02/26/2021 0525   HDL 57 02/26/2021 0525   CHOLHDL 2.3 02/26/2021 0525   VLDL 11 02/26/2021 0525   LDLCALC 63 02/26/2021 0525   Cardiac Panel (last 3 results) No results for input(s): CKTOTAL, CKMB, TROPONINI, RELINDX in the last 72 hours.  HEMOGLOBIN A1C Lab Results  Component Value Date   HGBA1C 5.5 02/26/2021   MPG 111.15 02/26/2021   TSH Recent Labs    02/27/21 1211  TSH 0.633    Imaging: DG CHEST PORT 1 VIEW  Result Date: 03/02/2021 CLINICAL DATA:  Cough EXAM: PORTABLE CHEST 1 VIEW COMPARISON:  None FINDINGS: Normal heart size and mediastinal contours. Lung volumes are low. No pleural effusion or edema. No airspace consolidation. Visualized osseous structures are unremarkable. IMPRESSION: Low lung volumes.  No signs of pneumonia. Electronically Signed   By: Kerby Moors M.D.   On: 03/02/2021 09:45    Cardiac Studies:  EKG:   EKG 03/01/2021: Atrial fibrillation with rapid ventricular response at the rate of 105 bpm, normal axis, incomplete right bundle branch block.  Echocardiogram 02/26/2021:  1. Left ventricular ejection fraction, by estimation, is 55 to 60%. The left ventricle has normal function. The left ventricle has no regional wall motion abnormalities. Left ventricular diastolic parameters are consistent with Grade II diastolic  dysfunction (pseudonormalization).  2. Right ventricular systolic function is normal. The right ventricular size is mildly enlarged. Tricuspid regurgitation signal is inadequate for assessing PA pressure.  3. Left atrial size was mildly dilated.  4. Right atrial size was mildly dilated.  5. The mitral valve is normal in structure. No evidence of mitral valve regurgitation. No evidence of mitral stenosis.  6. The aortic valve is tricuspid. Aortic valve regurgitation is not visualized. Mild aortic valve sclerosis is present, with no evidence of aortic valve stenosis.  7. The inferior vena cava is dilated in size with >50% respiratory variability, suggesting right atrial pressure of 8 mmHg.  Transesophageal echocardiogram (TEE) : Preliminary report 03/01/21   Sedation: See anesthesia records.    TEE was performed without complications   LV: Low normal 50-55%. RV: Grossly normal structure and function.  LA: Visually dilated. Spontaneous echo contrast was not present.  No thrombus present. Left atrial appendage: Spontaneous echo contrast was not present.  No thrombus present. Inter atrial septum is intact but redundant, bubble study negative for atrial level shunting. RA: Grossly normal.    MV: mild regurgitation,no stenosis, no vegetation noted.  TV: mild regurgitation,no stenosis, no vegetation noted. AV: no regurgitation,no stenosis, no vegetation noted.   PV: no regurgitation, no stenosis, no vegetation noted.   Thoracic and ascending aorta: Plaque  present.  Direct-current cardioversion 03/01/2021: Unsuccessful.  Transient sinus.   Scheduled Meds:  amiodarone  200 mg Oral BID   apixaban  5 mg Oral BID   atorvastatin  10 mg Oral Daily   Chlorhexidine Gluconate Cloth  6 each Topical Daily   digoxin  0.125 mg Oral Daily   doxycycline  100 mg Oral Q12H   magnesium oxide  400 mg Oral Daily   midodrine  10 mg Oral TID WC   mometasone-formoterol  2 puff Inhalation BID   multivitamin with minerals  1 tablet Oral Daily   umeclidinium bromide  1 puff Inhalation Daily   Continuous Infusions:  sodium chloride Stopped (03/02/21 1000)   phenylephrine (NEO-SYNEPHRINE) Adult infusion Stopped (03/02/21 1212)   PRN Meds:.acetaminophen **OR** acetaminophen (TYLENOL) oral liquid 160 mg/5 mL **OR** acetaminophen, albuterol, labetalol **AND** [DISCONTINUED] clevidipine, senna-docusate  Assessment/Plan:  Michelle Dean is a 75 y.o. female whose past medical history and cardiac risk factors include: Recent stroke (M2/M3 occlusion) s/p TNKase followed by partial recannulation with reocclusion followed by mechanical thrombolysis achieving TICI 2C revascularization, HTN, HLD, COPD on intermittent oxygen support.  1.  Persistent atrial fibrillation with rapid ventricular response CHA2DS2-VASc Score is 5.  Yearly risk of stroke: 7.2% (A, F, CVA).  Score of 1=0.6; 2=2.2; 3=3.2; 4=4.8; 5=7.2; 6=9.8; 7=>9.8) -(CHF; HTN; vasc disease DM,  Female = 1; Age <65 =0; 65-74 = 1,  >75 =2; stroke/embolism= 2).   2.  Iatrogenic hypotension 3.  Cardioembolic stroke with aphasia, markedly improved this morning and patient completely coherent.  Recommendation:  Patient has developed bibasilar crackles and wheezing, has now been started on antibiotics.  Today she has more pronounced sensory aphasia.  In and out of this as per nursing.  Overall from cardiac standpoint, although heart rate is elevated, I am not too concerned about atrial fibrillation with RVR in the absence  of congestive heart failure.  Continue amiodarone 200 mg p.o. twice daily, upon discharge, 200 mg daily.  Continue digoxin for rate control and also continue midodrine 10 mg 3 times daily until blood pressure is stable.  She has history of hypertension, if blood pressure continues to rise, we could then discontinue midodrine.  Otherwise stable from cardiac standpoint for now, would recommend continued rehab and she has now been transitioned to oral anticoagulants as well.  Please call if questions and we will follow her up in the outpatient basis.    Adrian Prows, MD, [facility] 03/03/2021, 12:01 PM Office: [phone] Fax: [phone] Pager: [phone]

## 2021-03-03 NOTE — Progress Notes (Signed)
NAME:  Michelle Dean, MRN:  [id number], DOB:  March 27, 1946, LOS: 6 ADMISSION DATE:  02/25/2021, CONSULTATION DATE: 02/27/2021 REFERRING MD: Dr. Thomasena Edis, CHIEF COMPLAINT: Aphasia  History of Present Illness:  Michelle Dean is a 75 y.o. female who has a PMH as outlined below.  She presented to any [facility] ED 10/17 with acute onset of confusion and expressive and receptive aphasia.  CT head was negative for hemorrhage.  She received TNKase.  CTA revealed left M2/M3 occlusion for which she was transferred to [facility] for angiogram and possible thrombectomy.   She was taken to IR and had partial recanalization with reocclusion followed by mechanical thrombolysis achieving a TICI 2C revascularization.   10/19, she had A. fib with RVR.  She was started on Cardizem however RVR persisted.  PCCM subsequently asked to see in consultation.  Pertinent  Medical History  has Acute ischemic stroke (HCC); Stroke (cerebrum) (HCC); Middle cerebral artery embolism, left; Aphasia; Atrial fibrillation (HCC); Atrial flutter with rapid ventricular response (HCC); Benign hypertension; and Mixed hyperlipidemia on their problem list.  Significant Hospital Events: Including procedures, antibiotic start and stop dates in addition to other pertinent events   10/17> admit 10/19> PCCM consult for A. fib RVR 10/21 TEE/cardioversion  Interim History / Subjective:  Unfortunately went back into A. Fib Attempting rate control Cough with green and yellow secretions-chest x-ray 10/22 no infiltrate -Empiric Doxy  Objective   Blood pressure (!) 141/94, pulse (!) 115, temperature 97.6 F (36.4 C), temperature source Oral, resp. rate (!) 29, height 5\' 7"  (1.702 m), weight 90.2 kg, SpO2 97 %.        Intake/Output Summary (Last 24 hours) at 03/03/2021 1027 Last data filed at 03/03/2021 0300 Gross per 24 hour  Intake 175.19 ml  Output --  Net 175.19 ml   Filed Weights   02/25/21 1452 02/25/21 1514  Weight: 99.8 kg 90.2  kg    Examination: General: Not in distress HENT: Expressive aphasia Lungs: Clear breath sounds bilaterally  Cardiovascular: S1-S2 appreciated Abdomen: Bowel sounds appreciated Extremities: No clubbing, no edema Neuro: Alert GU:   CTA head 10/17 > Left M2/M3 occlusion. CT 10/18 > left MCA contrast staining, hypodensity in left temporal lobe. MRI brain 10/18 > mod posterior left MCA infarct, mild to mod chronic small vessel ischemic disease. Echo 10/18 > EF 55-60%, G2DD, mildly enlarged RV/LA/RA. LE duplex 10/18 > neg. TSH normal.  Resolved Hospital Problem list     Assessment & Plan:   Left M2/M3 occlusion -S/p TNKase and partial recanalization with reocclusion followed by mechanical thrombolysis achieving a TICI 2C vascularization -Stroke team continues to follow -Speech eval for aphasia -Progressive ambulation  A. fib with RVR -New onset -Post TEE/cardioversion 10/21 -Went back into A. fib -On amiodarone p.o. -Digoxin-being loaded   History of COPD -Supplemental oxygen -Continue bronchodilators  Cough with sputum production -No infiltrate on chest x-ray -On doxycycline -Leukocytosis improving -No fever overnight  Best Practice (right click and "Reselect all SmartList Selections" daily)   Diet/type: Regular consistency (see orders) DVT prophylaxis: systemic heparin GI prophylaxis: N/A Lines: N/A Foley:  N/A Code Status:  full code Last date of multidisciplinary goals of care discussion [pending ]  Labs   CBC: Recent Labs  Lab 02/25/21 1441 02/26/21 0525 02/28/21 0356 03/01/21 0325 03/02/21 0231 03/03/21 0135  WBC 9.9 12.2* 12.2* 11.1* 23.6* 19.5*  NEUTROABS 6.2 10.2*  --   --   --   --   HGB 13.6 12.4 11.8* 10.7* 10.8* 10.7*  HCT 43.6 38.6 37.9 34.1* 34.2* 34.0*  MCV 95.8 93.9 96.9 95.5 95.3 95.8  PLT 225 222 228 189 203 198    Basic Metabolic Panel: Recent Labs  Lab 02/28/21 0356 03/01/21 0325 03/02/21 0046 03/02/21 0231  03/03/21 0135  NA 142 140 138 139 138  K 3.8 3.9 3.6 3.9 4.3  CL 109 107 104 103 104  CO2 24 28 26 26 25   GLUCOSE 113* 109* 133* 131* 103*  BUN 10 12 13 13 15   CREATININE 0.80 0.83 0.97 1.00 0.85  CALCIUM 8.8* 8.6* 8.6* 8.7* 8.5*  MG 1.6*  --  1.5*  --  2.0   GFR: Estimated Creatinine Clearance: 65.9 mL/min (by C-G formula based on SCr of 0.85 mg/dL). Recent Labs  Lab 02/28/21 0356 03/01/21 0325 03/02/21 0231 03/03/21 0135  WBC 12.2* 11.1* 23.6* 19.5*    Liver Function Tests: Recent Labs  Lab 02/25/21 1441  AST 20  ALT 14  ALKPHOS 56  BILITOT 0.6  PROT 6.8  ALBUMIN 3.9   No results for input(s): LIPASE, AMYLASE in the last 168 hours. No results for input(s): AMMONIA in the last 168 hours.  ABG No results found for: PHART, PCO2ART, PO2ART, HCO3, TCO2, ACIDBASEDEF, O2SAT   Coagulation Profile: Recent Labs  Lab 02/25/21 1441 03/01/21 0325  INR 1.0 1.1    Cardiac Enzymes: No results for input(s): CKTOTAL, CKMB, CKMBINDEX, TROPONINI in the last 168 hours.  HbA1C: Hgb A1c MFr Bld  Date/Time Value Ref Range Status  02/26/2021 05:25 AM 5.5 4.8 - 5.6 % Final    Comment:    (NOTE) Pre diabetes:          5.7%-6.4%  Diabetes:              >6.4%  Glycemic control for   <7.0% adults with diabetes     CBG: Recent Labs  Lab 02/25/21 1420  GLUCAP 99    Review of Systems:   unremarkable  Past Medical History:  She,  has no past medical history on file.   Surgical History:   Past Surgical History:  Procedure Laterality Date   IR CT HEAD LTD  02/25/2021   IR PERCUTANEOUS ART THROMBECTOMY/INFUSION INTRACRANIAL INC DIAG ANGIO  02/25/2021   RADIOLOGY WITH ANESTHESIA N/A 02/25/2021   Procedure: IR WITH ANESTHESIA;  Surgeon: Radiologist, Medication, MD;  Location: [facility] OR;  Service: Radiology;  Laterality: N/A;     Social History:      Family History:  Her family history is not on file.   Allergies Allergies  Allergen Reactions   Aspirin      Other reaction(s): Bleeding   Penicillins Hives and Rash   Lisinopril Cough     Home Medications  Prior to Admission medications   Medication Sig Start Date End Date Taking? Authorizing Provider  albuterol (PROVENTIL) (2.5 MG/3ML) 0.083% nebulizer solution Take 2.5 mg by nebulization every 4 (four) hours as needed for wheezing. 11/21/19  Yes [provider]  atorvastatin (LIPITOR) 10 MG tablet Take 10 mg by mouth daily. 12/24/20  Yes [provider]  carboxymethylcellulose (REFRESH PLUS) 0.5 % SOLN Place 1 drop into both eyes 4 (four) times daily as needed (dry eyes).   Yes [provider]  DULERA 100-5 MCG/ACT AERO Inhale 2 puffs into the lungs 2 (two) times daily. 02/14/21  Yes [provider]  losartan (COZAAR) 25 MG tablet Take 25 mg by mouth daily. 02/21/21  Yes [provider]  Multiple Vitamin (MULTIVITAMIN) tablet Take  1 tablet by mouth daily.   Yes [provider]  omeprazole (PRILOSEC) 20 MG capsule Take 20 mg by mouth daily. 02/04/21  Yes [provider]  tiotropium (SPIRIVA HANDIHALER) 18 MCG inhalation capsule Place 18 mcg into inhaler and inhale daily. 01/29/21  Yes [provider]    Virl Diamond, MD  PCCM Pager: See Loretha Stapler

## 2021-03-03 NOTE — Discharge Instructions (Signed)

## 2021-03-03 NOTE — Progress Notes (Signed)
STROKE TEAM PROGRESS NOTE   INTERVAL HISTORY She is sitting in bedside chair comfortably.. No family at bedside. She has been weaned off neodrip and blood pressure is stable.  She has been switched to oral amiodarone and digoxin but heart rate continues to be tachycardic and appreciate cardiology help in managing her A. fib and heart rate.  She remains aphasic, unable to name and unable to repeat. She is AFIB on monitor with HR 110-30's, Therapy recommending CIR.  Neurological exam is unchanged.  Vital signs are stable except for tachycardia.  WBC count today has come down to 19.5.  She has not spiked any temperature. Vitals:   03/03/21 1153 03/03/21 1200 03/03/21 1300 03/03/21 1400  BP:  (!) 148/61  (!) 143/80  Pulse:  90 (!) 107 91  Resp:  (!) 26 (!) 27 (!) 23  Temp: (!) 97.5 F (36.4 C)     TempSrc: Oral     SpO2:  97% 98% 98%  Weight:      Height:       CBC:  Recent Labs  Lab 02/25/21 1441 02/26/21 0525 02/28/21 0356 03/02/21 0231 03/03/21 0135  WBC 9.9 12.2*   < > 23.6* 19.5*  NEUTROABS 6.2 10.2*  --   --   --   HGB 13.6 12.4   < > 10.8* 10.7*  HCT 43.6 38.6   < > 34.2* 34.0*  MCV 95.8 93.9   < > 95.3 95.8  PLT 225 222   < > 203 198   < > = values in this interval not displayed.   Basic Metabolic Panel:  Recent Labs  Lab 03/02/21 0046 03/02/21 0231 03/03/21 0135  NA 138 139 138  K 3.6 3.9 4.3  CL 104 103 104  CO2 26 26 25   GLUCOSE 133* 131* 103*  BUN 13 13 15   CREATININE 0.97 1.00 0.85  CALCIUM 8.6* 8.7* 8.5*  MG 1.5*  --  2.0   Lipid Panel:  Recent Labs  Lab 02/26/21 0525  CHOL 131  TRIG 55  HDL 57  CHOLHDL 2.3  VLDL 11  LDLCALC 63   HgbA1c:  Recent Labs  Lab 02/26/21 0525  HGBA1C 5.5   Urine Drug Screen:  Recent Labs  Lab 02/26/21 0256  LABOPIA NONE DETECTED  COCAINSCRNUR NONE DETECTED  LABBENZ NONE DETECTED  AMPHETMU NONE DETECTED  THCU NONE DETECTED  LABBARB NONE DETECTED    Alcohol Level  Recent Labs  Lab 02/25/21 1441  ETH  <10    IMAGING past 24 hours No results found.  PHYSICAL EXAM  Temp:  [97.5 F (36.4 C)-98.5 F (36.9 C)] 97.5 F (36.4 C) (10/23 1153) Pulse Rate:  [88-121] 91 (10/23 1400) Resp:  [18-31] 23 (10/23 1400) BP: (95-158)/(49-117) 143/80 (10/23 1400) SpO2:  [90 %-100 %] 98 % (10/23 1400)  General - Well nourished, well developed pleasant elderly Caucasian lady, in no apparent distress.  Ophthalmologic - fundi not visualized due to noncooperation.  Cardiovascular - A fib on monitor  Mental Status - awake and alert, with receptive greater than expressive aphasia, not able to follow two-step commands, unable to name objects or repeat phrases except simple ones. She attempts to read your lips.  Motor Strength - The patient's strength was normal in all extremities and pronator drift was absent.  Bulk was normal and fasciculations were absent.   Motor Tone - Muscle tone was assessed at the neck and appendages and was normal.  Sensory - Light touch, temperature/pinprick were assessed  and were symmetrical.    Coordination - unable to assess due to aphasia   Gait and Station - deferred.   ASSESSMENT/PLAN Ms. Janat Tabbert is a 75 y.o. female with history of HTN, HLD admitted for aphasia. 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  MRI large inferior MCA infarct with petechial hemorrhage 2D Echo EF 55-60% LE venous doppler no DVT LDL 63 HgbA1c 5.5 UDS neg SCDs for VTE prophylaxis No antithrombotic prior to admission, now on heparin IV.  We will plan to transition to Eliquis 03/03/2021  ongoing aggressive stroke risk factor management Therapy  recommendations:  CIR Disposition:  pending  Afib RVR Cardiology following  Continue afib RVR  New diagnosis S/p cardioversion 10/21- unfortunately back in A fib  On cardizem discontinued  amiodarone drip transition to p.o. Started digoxin by cards today  Eliquis started 03/03/2021 TEE 10/21 EF 50-55%. No thrombus or vegetation.   Hypertension Hypotensive overnight requiring Neo gtt currently off Off cleviprex On cardizem BP goal < 160 given on heparin IV with petechial hemorrhage Long term BP goal normotensive  Hyperlipidemia Home meds:  lipitor 10  LDL 63, goal < 70 Now on lipitor 10 Continue statin at discharge  Other Stroke Risk Factors Advanced age Obesity, Body mass index is 31.15 kg/m.    Other Active Problems Cough with sputum production  -Leukocytosis WBC 23.6, febrile, Tmax 102.1 overnight. -No fever hence will follow for now -CXR with no acute process.  - started on doxycycline for 7 days   COPD. Oxygen as needed and bronchodilators    Hospital day # 6   Patient continues to have aphasia and and slight difficulty with rapid A. fib rate control despite being on amiodarone and digoxin.  Appreciate cardiology help.  Plan start Eliquis and discontinue heparin.  Continue ongoing therapies.    No family available at the bedside for discussion. Discussed with Dr. Wynona Neat critical care medicine and cardiology.This patient is critically ill and at significant risk of neurological worsening, death and care requires constant monitoring of vital signs, hemodynamics,respiratory and cardiac monitoring, extensive review of multiple databases, frequent neurological assessment, discussion with family, other specialists and medical decision making of high complexity.I have made any additions or clarifications directly to the above note.This critical care time does not reflect procedure time, or teaching time or supervisory time of PA/NP/Med Resident etc but could involve care  discussion time.  I spent 30 minutes of neurocritical care time  in the care of  this patient.     Delia Heady, MD Medical Director [facility] Pager: [phone] 03/03/2021 2:33 PM   To contact Stroke Continuity provider, please refer to [email or web address]. After hours, contact General Neurology

## 2021-03-04 ENCOUNTER — Other Ambulatory Visit (HOSPITAL_COMMUNITY): Payer: Self-pay

## 2021-03-04 LAB — BASIC METABOLIC PANEL
Anion gap: 11 (ref 5–15)
BUN: 11 mg/dL (ref 8–23)
CO2: 28 mmol/L (ref 22–32)
Calcium: 8.9 mg/dL (ref 8.9–10.3)
Chloride: 100 mmol/L (ref 98–111)
Creatinine, Ser: 0.84 mg/dL (ref 0.44–1.00)
GFR, Estimated: >60 mL/min (ref >60)
Glucose, Bld: 108 mg/dL — ABNORMAL HIGH (ref 70–99)
Potassium: 3.9 mmol/L (ref 3.5–5.1)
Sodium: 139 mmol/L (ref 135–145)

## 2021-03-04 LAB — CBC
HCT: 35.5 % — ABNORMAL LOW (ref 36.0–46.0)
Hemoglobin: 10.9 g/dL — ABNORMAL LOW (ref 12.0–15.0)
MCH: 29.7 pg (ref 26.0–34.0)
MCHC: 30.7 g/dL (ref 30.0–36.0)
MCV: 96.7 fL (ref 80.0–100.0)
Platelets: 234 10*3/uL (ref 150–400)
RBC: 3.67 MIL/uL — ABNORMAL LOW (ref 3.87–5.11)
RDW: 13.9 % (ref 11.5–15.5)
WBC: 14 10*3/uL — ABNORMAL HIGH (ref 4.0–10.5)
nRBC: 0 % (ref 0.0–0.2)

## 2021-03-04 LAB — PROTIME-INR
INR: 1.3 — ABNORMAL HIGH (ref 0.8–1.2)
Prothrombin Time: 16.6 seconds — ABNORMAL HIGH (ref 11.4–15.2)

## 2021-03-04 LAB — BRAIN NATRIURETIC PEPTIDE: B Natriuretic Peptide: 225.7 pg/mL — ABNORMAL HIGH (ref 0.0–100.0)

## 2021-03-04 MED ORDER — VERAPAMIL HCL 40 MG PO TABS
40.0000 mg | ORAL_TABLET | Freq: Three times a day (TID) | ORAL | Status: DC
  Administered 2021-03-04 – 2021-03-07 (×9): 40 mg via ORAL
  Filled 2021-03-04 (×12): qty 1

## 2021-03-04 MED ORDER — POTASSIUM CHLORIDE ER 10 MEQ PO TBCR
10.0000 meq | EXTENDED_RELEASE_TABLET | Freq: Two times a day (BID) | ORAL | Status: DC
Start: 2021-03-04 — End: 2021-03-04

## 2021-03-04 MED ORDER — FUROSEMIDE 10 MG/ML IJ SOLN
40.0000 mg | Freq: Once | INTRAMUSCULAR | Status: AC
  Administered 2021-03-04: 40 mg via INTRAVENOUS
  Filled 2021-03-04: qty 4

## 2021-03-04 MED ORDER — FUROSEMIDE 10 MG/ML IJ SOLN
40.0000 mg | Freq: Two times a day (BID) | INTRAMUSCULAR | Status: AC
  Administered 2021-03-04 – 2021-03-06 (×3): 40 mg via INTRAVENOUS
  Filled 2021-03-04 (×3): qty 4

## 2021-03-04 MED ORDER — POTASSIUM CHLORIDE CRYS ER 20 MEQ PO TBCR
40.0000 meq | EXTENDED_RELEASE_TABLET | Freq: Once | ORAL | Status: AC
  Administered 2021-03-04: 40 meq via ORAL
  Filled 2021-03-04: qty 2

## 2021-03-04 NOTE — Progress Notes (Signed)
Occupational Therapy Treatment Patient Details Name: Michelle Dean MRN: [id number] DOB: 18-Mar-1946 Today's Date: 03/04/2021   History of present illness 75 yo female presents to [facility] on 10/17 with aphasia. Imaging reveals Left MCA distal M2 stroke, currently embolic of undetermined etiology, transferred to [facility] for intervention. s/p L common carotid arteriogram via R CFA, partial recanalization with reocclusion with x 1 pass with 40 mm solitaireX retriever and contact aspiration,x1 pass with contact aspiration and x1 pass with mechanical thrombolysis  acieving a TICI 2C revascularization on 10/17. PMH includes COPD (intermittently uses oxygen at home), hypertension, hyperlipidemia.   OT comments  Pt continues to progress toward goals this session with HR 151 and 88% RA saturations. Pt with visual cues more automatic with adls. Pt demonstrates HOH but receptive deficits as well. Pt in familiar environment will do far better than unfamiliar. CIr could help patient progress back to home level with further intense therapy. Recommendation remains CIR until denied. Pt will need home (A) from family to be safe. Pt is not safe to cook or use sharp cutting kitchen items at this time.     Recommendations for follow up therapy are one component of a multi-disciplinary discharge planning process, led by the attending physician.  Recommendations may be updated based on patient status, additional functional criteria and insurance authorization.    Follow Up Recommendations  Acute inpatient rehab (3hours/day)    Assistance Recommended at Discharge Intermittent Supervision/Assistance  Equipment Recommendations       Recommendations for Other Services Rehab consult    Precautions / Restrictions Precautions Precautions: Fall Precaution Comments: receptive aphasia       Mobility Bed Mobility Overal bed mobility: Needs Assistance Bed Mobility: Supine to Sit     Supine to sit: Supervision           Transfers Overall transfer level: Needs assistance   Transfers: Sit to/from Stand Sit to Stand: Min guard                 Balance Overall balance assessment: Needs assistance Sitting-balance support: No upper extremity supported;Feet supported Sitting balance-Leahy Scale: Fair       Standing balance-Leahy Scale: Fair                             ADL either performed or assessed with clinical judgement   ADL Overall ADL's : Needs assistance/impaired     Grooming: Set up;Standing Grooming Details (indicate cue type and reason): with mod cues to sequence and min guard balance. Upper Body Bathing: Min guard;Standing   Lower Body Bathing: Minimal assistance;Sit to/from stand   Upper Body Dressing : Min guard;Sitting   Lower Body Dressing: Minimal assistance;Sitting/lateral leans   Toilet Transfer: Minimal assistance;Regular Teacher, adult education Details (indicate cue type and reason): cues for line management and to turn safely. pt with some unsteady to turn Toileting- Architect and Hygiene: Min guard;Sitting/lateral lean       Functional mobility during ADLs: Min guard General ADL Comments: pt demonstrates increased ability to engage in adl task and with increased therapy coudl progress to higher level     Vision       Perception     Praxis      Cognition Arousal/Alertness: Awake/alert Behavior During Therapy: WFL for tasks assessed/performed Overall Cognitive Status: Impaired/Different from baseline Area of Impairment: Safety/judgement;Awareness;Problem solving;Following commands  Following Commands: Follows one step commands inconsistently Safety/Judgement: Decreased awareness of safety;Decreased awareness of deficits Awareness: Emergent Problem Solving: Slow processing;Decreased initiation;Difficulty sequencing General Comments: pt if given a visual cues for an automatic task will engage in  task. if given a verbal cue no visual pt does not understand the command and demonstrates receptive deficits. pt automatic with tooth brushing          Exercises     Shoulder Instructions       General Comments HR 151 during session RA 88% requires 3L [place]    Pertinent Vitals/ Pain       Pain Assessment: No/denies pain  Home Living                                          Prior Functioning/Environment              Frequency  Min 2X/week        Progress Toward Goals  OT Goals(current goals can now be found in the care plan section)  Progress towards OT goals: Progressing toward goals  Acute Rehab OT Goals OT Goal Formulation: With patient/family Time For Goal Achievement: 03/12/21 Potential to Achieve Goals: Good ADL Goals Pt Will Perform Grooming: with modified independence;sitting Pt Will Perform Upper Body Bathing: with modified independence;sitting Pt Will Perform Lower Body Bathing: with modified independence;sit to/from stand Pt Will Perform Lower Body Dressing: with modified independence;sit to/from stand Pt Will Transfer to Toilet: with modified independence;ambulating Additional ADL Goal #1: pt will complete bed mobility supervision level as precursor to adls.  Plan Discharge plan remains appropriate    Co-evaluation                 AM-PAC OT "6 Clicks" Daily Activity     Outcome Measure   Help from another person eating meals?: A Little Help from another person taking care of personal grooming?: A Little Help from another person toileting, which includes using toliet, bedpan, or urinal?: A Little Help from another person bathing (including washing, rinsing, drying)?: A Little Help from another person to put on and taking off regular upper body clothing?: A Little Help from another person to put on and taking off regular lower body clothing?: A Little 6 Click Score: 18    End of Session Equipment Utilized During  Treatment: Oxygen  OT Visit Diagnosis: Unsteadiness on feet (R26.81);Muscle weakness (generalized) (M62.81);Other symptoms and signs involving the nervous system (R29.898)   Activity Tolerance Patient tolerated treatment well   Patient Left in chair;with call bell/phone within reach;with chair alarm set   Nurse Communication Mobility status;Precautions        Time: 0940-1000 OT Time Calculation (min): 20 min  Charges: OT General Charges $OT Visit: 1 Visit OT Treatments $Self Care/Home Management : 8-22 mins   Brynn, OTR/L  Acute Rehabilitation Services Pager: [phone] Office: [phone] .   Mateo Flow 03/04/2021, 10:12 AM

## 2021-03-04 NOTE — H&P (View-Only) (Signed)
Subjective:  Michelle Dean is a 75 y.o. female whose past medical history and cardiac risk factors include: Recent stroke (M2/M3 occlusion) s/p TNKase followed by partial recannulation with reocclusion followed by mechanical thrombolysis achieving TICI 2C revascularization, HTN, HLD, COPD on intermittent oxygen support.  Patient was noted to have on and off sensory aphasia.  Blood pressure has been stable and she has not needed any further pressor support.  However patient has developed slight tachypnea and also bibasilar crackles.  Intake/Output from previous day:  No intake/output data recorded. Total I/O In: 240 [P.O.:240] Out: -   Blood pressure (!) 120/109, pulse (!) 149, temperature 98.2 F (36.8 C), temperature source Oral, resp. rate (!) 33, height 5' 7" (1.702 m), weight 90.2 kg, SpO2 97 %. Body mass index is 31.15 kg/m. Blood pressure 124/74 mmHg.  The above-noted number is in error. Vitals with BMI 03/04/2021 03/04/2021 03/04/2021  Height - - -  Weight - - -  BMI - - -  Systolic 121 975 883  Diastolic 254 63 74  Pulse 149 114 143    Physical Exam Constitutional:      Appearance: She is obese.  HENT:     Mouth/Throat:     Mouth: Mucous membranes are moist.  Eyes:     Extraocular Movements: Extraocular movements intact.  Neck:     Vascular: No carotid bruit or JVD.  Cardiovascular:     Rate and Rhythm: Tachycardia present. Rhythm irregular.     Pulses: Intact distal pulses.     Heart sounds: Normal heart sounds. No murmur heard.   No gallop.  Pulmonary:     Effort: Pulmonary effort is normal.     Breath sounds: Rales (Bibasilar crackles and faint expiratory wheeze.) present.  Abdominal:     General: Bowel sounds are normal.     Palpations: Abdomen is soft.  Musculoskeletal:        General: No swelling.     Cervical back: Neck supple.  Skin:    General: Skin is warm.  Neurological:     Comments: Expressive aphasia  Psychiatric:        Mood and Affect: Mood  normal.    Lab Results: BNP (last 3 results) No results for input(s): BNP in the last 8760 hours.  ProBNP (last 3 results) No results for input(s): PROBNP in the last 8760 hours. BMP Latest Ref Rng & Units 03/04/2021 03/03/2021 03/02/2021  Glucose 70 - 99 mg/dL 108(H) 103(H) 131(H)  BUN 8 - 23 mg/dL _0 Creatinine 0.44 - 1.00 mg/dL 0.84 0.85 1.00  Sodium 135 - 145 mmol/L 139 138 139  Potassium 3.5 - 5.1 mmol/L 3.9 4.3 3.9  Chloride 98 - 111 mmol/L 100 104 103  CO2 22 - 32 mmol/L _1 Calcium 8.9 - 10.3 mg/dL 8.9 8.5(L) 8.7(L)   Hepatic Function Latest Ref Rng & Units 02/25/2021  Total Protein 6.5 - 8.1 g/dL 6.8  Albumin 3.5 - 5.0 g/dL 3.9  AST 15 - 41 U/L 20  ALT 0 - 44 U/L 14  Alk Phosphatase 38 - 126 U/L 56  Total Bilirubin 0.3 - 1.2 mg/dL 0.6   CBC Latest Ref Rng & Units 03/04/2021 03/03/2021 03/02/2021  WBC 4.0 - 10.5 K/uL 14.0(H) 19.5(H) 23.6(H)  Hemoglobin 12.0 - 15.0 g/dL 10.9(L) 10.7(L) 10.8(L)  Hematocrit 36.0 - 46.0 % 35.5(L) 34.0(L) 34.2(L)  Platelets 150 - 400 K/uL 234 198 203   Lipid Panel     Component Value Date/Time  CHOL 131 02/26/2021 0525   TRIG 55 02/26/2021 0525   HDL 57 02/26/2021 0525   CHOLHDL 2.3 02/26/2021 0525   VLDL 11 02/26/2021 0525   LDLCALC 63 02/26/2021 0525   Cardiac Panel (last 3 results) No results for input(s): CKTOTAL, CKMB, TROPONINI, RELINDX in the last 72 hours.  HEMOGLOBIN A1C Lab Results  Component Value Date   HGBA1C 5.5 02/26/2021   MPG 111.15 02/26/2021   TSH Recent Labs    02/27/21 1211  TSH 0.633   Imaging: No results found.  Cardiac Studies:  EKG:  EKG 03/01/2021: Atrial fibrillation with rapid ventricular response at the rate of 105 bpm, normal axis, incomplete right bundle branch block.  Echocardiogram 02/26/2021:  1. Left ventricular ejection fraction, by estimation, is 55 to 60%. The left ventricle has normal function. The left ventricle has no regional wall motion abnormalities. Left  ventricular diastolic parameters are consistent with Grade II diastolic  dysfunction (pseudonormalization).  2. Right ventricular systolic function is normal. The right ventricular size is mildly enlarged. Tricuspid regurgitation signal is inadequate for assessing PA pressure.  3. Left atrial size was mildly dilated.  4. Right atrial size was mildly dilated.  5. The mitral valve is normal in structure. No evidence of mitral valve regurgitation. No evidence of mitral stenosis.  6. The aortic valve is tricuspid. Aortic valve regurgitation is not visualized. Mild aortic valve sclerosis is present, with no evidence of aortic valve stenosis.  7. The inferior vena cava is dilated in size with >50% respiratory variability, suggesting right atrial pressure of 8 mmHg.  Transesophageal echocardiogram (TEE) : Preliminary report 03/01/21   Sedation: See anesthesia records.    TEE was performed without complications   LV: Low normal 50-55%. RV: Grossly normal structure and function.  LA: Visually dilated. Spontaneous echo contrast was not present.  No thrombus present. Left atrial appendage: Spontaneous echo contrast was not present.  No thrombus present. Inter atrial septum is intact but redundant, bubble study negative for atrial level shunting. RA: Grossly normal.    MV: mild regurgitation,no stenosis, no vegetation noted.  TV: mild regurgitation,no stenosis, no vegetation noted. AV: no regurgitation,no stenosis, no vegetation noted.   PV: no regurgitation, no stenosis, no vegetation noted.   Thoracic and ascending aorta: Plaque present.  Direct-current cardioversion 03/01/2021: Unsuccessful.  Transient sinus.   Scheduled Meds:  amiodarone  200 mg Oral BID   apixaban  5 mg Oral BID   atorvastatin  10 mg Oral Daily   Chlorhexidine Gluconate Cloth  6 each Topical Daily   digoxin  0.125 mg Oral Daily   doxycycline  100 mg Oral Q12H   magnesium oxide  400 mg Oral Daily   midodrine  10 mg  Oral TID WC   mometasone-formoterol  2 puff Inhalation BID   multivitamin with minerals  1 tablet Oral Daily   umeclidinium bromide  1 puff Inhalation Daily   Continuous Infusions:  sodium chloride Stopped (03/02/21 1000)   PRN Meds:.acetaminophen **OR** acetaminophen (TYLENOL) oral liquid 160 mg/5 mL **OR** acetaminophen, albuterol, labetalol **AND** [DISCONTINUED] clevidipine, senna-docusate  Assessment/Plan:  Michelle Dean is a 74 y.o. female whose past medical history and cardiac risk factors include: Recent stroke (M2/M3 occlusion) s/p TNKase followed by partial recannulation with reocclusion followed by mechanical thrombolysis achieving TICI 2C revascularization, HTN, HLD, COPD on intermittent oxygen support.  1.  Persistent atrial fibrillation with rapid ventricular response CHA2DS2-VASc Score is 5.  Yearly risk of stroke: 7.2% (A, F, CVA).  Score of   1=0.6; 2=2.2; 3=3.2; 4=4.8; 5=7.2; 6=9.8; 7=>9.8) -(CHF; HTN; vasc disease DM,  Female = 1; Age <65 =0; 65-74 = 1,  >75 =2; stroke/embolism= 2).   2.  Iatrogenic hypotension 3.  Cardioembolic stroke with expressive aphasia. 4.  Acute diastolic heart failure  Recommendation:  Patient has developed bibasilar crackles and wheezing, has now been started on antibiotics, however I suspect she probably has an acute decompensated diastolic heart failure.  40 mg IV Lasix given this morning.  Monitor electrolytes.  As her blood pressure has been stable on digoxin and also midodrine, I would like to try verapamil short-acting 40 mg 3 times daily.  I will schedule her for repeat direct-current cardioversion tomorrow.  It was attempted previously but she did not maintain sinus rhythm.  Hopefully now that she is well loaded with amiodarone, she remains in sinus rhythm hopefully.  Discussed with nursing.  I will place furosemide orders twice daily for today and follow-up on BMP and magnesium levels this evening.    Dhruti Ghuman, MD, FACC 03/04/2021,  10:38 AM Office: [phone] Fax: [phone] Pager: [phone]  

## 2021-03-04 NOTE — Progress Notes (Signed)
Subjective:  Michelle Dean is a 75 y.o. female whose past medical history and cardiac risk factors include: Recent stroke (M2/M3 occlusion) s/p TNKase followed by partial recannulation with reocclusion followed by mechanical thrombolysis achieving TICI 2C revascularization, HTN, HLD, COPD on intermittent oxygen support.  Patient was noted to have on and off sensory aphasia.  Blood pressure has been stable and she has not needed any further pressor support.  However patient has developed slight tachypnea and also bibasilar crackles.  Intake/Output from previous day:  No intake/output data recorded. Total I/O In: 240 [P.O.:240] Out: -   Blood pressure (!) 120/109, pulse (!) 149, temperature 98.2 F (36.8 C), temperature source Oral, resp. rate (!) 33, height 5' 7" (1.702 m), weight 90.2 kg, SpO2 97 %. Body mass index is 31.15 kg/m. Blood pressure 124/74 mmHg.  The above-noted number is in error. Vitals with BMI 03/04/2021 03/04/2021 03/04/2021  Height - - -  Weight - - -  BMI - - -  Systolic 121 975 883  Diastolic 254 63 74  Pulse 149 114 143    Physical Exam Constitutional:      Appearance: She is obese.  HENT:     Mouth/Throat:     Mouth: Mucous membranes are moist.  Eyes:     Extraocular Movements: Extraocular movements intact.  Neck:     Vascular: No carotid bruit or JVD.  Cardiovascular:     Rate and Rhythm: Tachycardia present. Rhythm irregular.     Pulses: Intact distal pulses.     Heart sounds: Normal heart sounds. No murmur heard.   No gallop.  Pulmonary:     Effort: Pulmonary effort is normal.     Breath sounds: Rales (Bibasilar crackles and faint expiratory wheeze.) present.  Abdominal:     General: Bowel sounds are normal.     Palpations: Abdomen is soft.  Musculoskeletal:        General: No swelling.     Cervical back: Neck supple.  Skin:    General: Skin is warm.  Neurological:     Comments: Expressive aphasia  Psychiatric:        Mood and Affect: Mood  normal.    Lab Results: BNP (last 3 results) No results for input(s): BNP in the last 8760 hours.  ProBNP (last 3 results) No results for input(s): PROBNP in the last 8760 hours. BMP Latest Ref Rng & Units 03/04/2021 03/03/2021 03/02/2021  Glucose 70 - 99 mg/dL 108(H) 103(H) 131(H)  BUN 8 - 23 mg/dL _0 Creatinine 0.44 - 1.00 mg/dL 0.84 0.85 1.00  Sodium 135 - 145 mmol/L 139 138 139  Potassium 3.5 - 5.1 mmol/L 3.9 4.3 3.9  Chloride 98 - 111 mmol/L 100 104 103  CO2 22 - 32 mmol/L _1 Calcium 8.9 - 10.3 mg/dL 8.9 8.5(L) 8.7(L)   Hepatic Function Latest Ref Rng & Units 02/25/2021  Total Protein 6.5 - 8.1 g/dL 6.8  Albumin 3.5 - 5.0 g/dL 3.9  AST 15 - 41 U/L 20  ALT 0 - 44 U/L 14  Alk Phosphatase 38 - 126 U/L 56  Total Bilirubin 0.3 - 1.2 mg/dL 0.6   CBC Latest Ref Rng & Units 03/04/2021 03/03/2021 03/02/2021  WBC 4.0 - 10.5 K/uL 14.0(H) 19.5(H) 23.6(H)  Hemoglobin 12.0 - 15.0 g/dL 10.9(L) 10.7(L) 10.8(L)  Hematocrit 36.0 - 46.0 % 35.5(L) 34.0(L) 34.2(L)  Platelets 150 - 400 K/uL 234 198 203   Lipid Panel     Component Value Date/Time  CHOL 131 02/26/2021 0525   TRIG 55 02/26/2021 0525   HDL 57 02/26/2021 0525   CHOLHDL 2.3 02/26/2021 0525   VLDL 11 02/26/2021 0525   LDLCALC 63 02/26/2021 0525   Cardiac Panel (last 3 results) No results for input(s): CKTOTAL, CKMB, TROPONINI, RELINDX in the last 72 hours.  HEMOGLOBIN A1C Lab Results  Component Value Date   HGBA1C 5.5 02/26/2021   MPG 111.15 02/26/2021   TSH Recent Labs    02/27/21 1211  TSH 0.633   Imaging: No results found.  Cardiac Studies:  EKG:  EKG 03/01/2021: Atrial fibrillation with rapid ventricular response at the rate of 105 bpm, normal axis, incomplete right bundle branch block.  Echocardiogram 02/26/2021:  1. Left ventricular ejection fraction, by estimation, is 55 to 60%. The left ventricle has normal function. The left ventricle has no regional wall motion abnormalities. Left  ventricular diastolic parameters are consistent with Grade II diastolic  dysfunction (pseudonormalization).  2. Right ventricular systolic function is normal. The right ventricular size is mildly enlarged. Tricuspid regurgitation signal is inadequate for assessing PA pressure.  3. Left atrial size was mildly dilated.  4. Right atrial size was mildly dilated.  5. The mitral valve is normal in structure. No evidence of mitral valve regurgitation. No evidence of mitral stenosis.  6. The aortic valve is tricuspid. Aortic valve regurgitation is not visualized. Mild aortic valve sclerosis is present, with no evidence of aortic valve stenosis.  7. The inferior vena cava is dilated in size with >50% respiratory variability, suggesting right atrial pressure of 8 mmHg.  Transesophageal echocardiogram (TEE) : Preliminary report 03/01/21   Sedation: See anesthesia records.    TEE was performed without complications   LV: Low normal 50-55%. RV: Grossly normal structure and function.  LA: Visually dilated. Spontaneous echo contrast was not present.  No thrombus present. Left atrial appendage: Spontaneous echo contrast was not present.  No thrombus present. Inter atrial septum is intact but redundant, bubble study negative for atrial level shunting. RA: Grossly normal.    MV: mild regurgitation,no stenosis, no vegetation noted.  TV: mild regurgitation,no stenosis, no vegetation noted. AV: no regurgitation,no stenosis, no vegetation noted.   PV: no regurgitation, no stenosis, no vegetation noted.   Thoracic and ascending aorta: Plaque present.  Direct-current cardioversion 03/01/2021: Unsuccessful.  Transient sinus.   Scheduled Meds:  amiodarone  200 mg Oral BID   apixaban  5 mg Oral BID   atorvastatin  10 mg Oral Daily   Chlorhexidine Gluconate Cloth  6 each Topical Daily   digoxin  0.125 mg Oral Daily   doxycycline  100 mg Oral Q12H   magnesium oxide  400 mg Oral Daily   midodrine  10 mg  Oral TID WC   mometasone-formoterol  2 puff Inhalation BID   multivitamin with minerals  1 tablet Oral Daily   umeclidinium bromide  1 puff Inhalation Daily   Continuous Infusions:  sodium chloride Stopped (03/02/21 1000)   PRN Meds:.acetaminophen **OR** acetaminophen (TYLENOL) oral liquid 160 mg/5 mL **OR** acetaminophen, albuterol, labetalol **AND** [DISCONTINUED] clevidipine, senna-docusate  Assessment/Plan:  Michelle Dean is a 75 y.o. female whose past medical history and cardiac risk factors include: Recent stroke (M2/M3 occlusion) s/p TNKase followed by partial recannulation with reocclusion followed by mechanical thrombolysis achieving TICI 2C revascularization, HTN, HLD, COPD on intermittent oxygen support.  1.  Persistent atrial fibrillation with rapid ventricular response CHA2DS2-VASc Score is 5.  Yearly risk of stroke: 7.2% (A, F, CVA).  Score of  1=0.6; 2=2.2; 3=3.2; 4=4.8; 5=7.2; 6=9.8; 7=>9.8) -(CHF; HTN; vasc disease DM,  Female = 1; Age <65 =0; 65-74 = 1,  >75 =2; stroke/embolism= 2).   2.  Iatrogenic hypotension 3.  Cardioembolic stroke with expressive aphasia. 4.  Acute diastolic heart failure  Recommendation:  Patient has developed bibasilar crackles and wheezing, has now been started on antibiotics, however I suspect she probably has an acute decompensated diastolic heart failure.  40 mg IV Lasix given this morning.  Monitor electrolytes.  As her blood pressure has been stable on digoxin and also midodrine, I would like to try verapamil short-acting 40 mg 3 times daily.  I will schedule her for repeat direct-current cardioversion tomorrow.  It was attempted previously but she did not maintain sinus rhythm.  Hopefully now that she is well loaded with amiodarone, she remains in sinus rhythm hopefully.  Discussed with nursing.  I will place furosemide orders twice daily for today and follow-up on BMP and magnesium levels this evening.    Adrian Prows, MD, [facility] 03/04/2021,  10:38 AM Office: [phone] Fax: [phone] Pager: [phone]

## 2021-03-04 NOTE — Progress Notes (Addendum)
STROKE TEAM PROGRESS NOTE   INTERVAL HISTORY Patient is resting comfortably in bed.  Blood pressure is stable, however, she remains tachycardic with rates in the 120s-140s.  Patient is afebrile.  She continues to demonstrate receptive and expressive aphasia.  She will soon be discharged to CIR or other inpatient rehab.  Vital signs are stable except she remains tachycardic with heart rate in the 120-140 range.  Cardiology is following A. fib rate controlled Vitals:   03/04/21 0700 03/04/21 0800 03/04/21 0900 03/04/21 1000  BP: (!) 124/53 126/74 120/63 (!) 120/109  Pulse: 80 (!) 143 (!) 114 (!) 149  Resp: (!) 23 (!) 35 (!) 31 (!) 33  Temp:  98.2 F (36.8 C)    TempSrc:  Oral    SpO2: 97% 97% 97% 97%  Weight:      Height:       CBC:  Recent Labs  Lab 02/25/21 1441 02/26/21 0525 02/28/21 0356 03/03/21 0135 03/04/21 0313  WBC 9.9 12.2*   < > 19.5* 14.0*  NEUTROABS 6.2 10.2*  --   --   --   HGB 13.6 12.4   < > 10.7* 10.9*  HCT 43.6 38.6   < > 34.0* 35.5*  MCV 95.8 93.9   < > 95.8 96.7  PLT 225 222   < > 198 234   < > = values in this interval not displayed.    Basic Metabolic Panel:  Recent Labs  Lab 03/02/21 0046 03/02/21 0231 03/03/21 0135 03/04/21 0313  NA 138   < > 138 139  K 3.6   < > 4.3 3.9  CL 104   < > 104 100  CO2 26   < > 25 28  GLUCOSE 133*   < > 103* 108*  BUN 13   < > 15 11  CREATININE 0.97   < > 0.85 0.84  CALCIUM 8.6*   < > 8.5* 8.9  MG 1.5*  --  2.0  --    < > = values in this interval not displayed.    Lipid Panel:  Recent Labs  Lab 02/26/21 0525  CHOL 131  TRIG 55  HDL 57  CHOLHDL 2.3  VLDL 11  LDLCALC 63    HgbA1c:  Recent Labs  Lab 02/26/21 0525  HGBA1C 5.5    Urine Drug Screen:  Recent Labs  Lab 02/26/21 0256  LABOPIA NONE DETECTED  COCAINSCRNUR NONE DETECTED  LABBENZ NONE DETECTED  AMPHETMU NONE DETECTED  THCU NONE DETECTED  LABBARB NONE DETECTED     Alcohol Level  Recent Labs  Lab 02/25/21 1441  ETH <10      IMAGING past 24 hours No results found.  PHYSICAL EXAM  Temp:  [97.5 F (36.4 C)-98.5 F (36.9 C)] 98.2 F (36.8 C) (10/24 0800) Pulse Rate:  [56-149] 149 (10/24 1000) Resp:  [19-35] 33 (10/24 1000) BP: (99-148)/(51-109) 120/109 (10/24 1000) SpO2:  [93 %-100 %] 97 % (10/24 1000)  General - Well nourished, well developed pleasant elderly Caucasian lady, in no apparent distress.   Cardiovascular - A fib on monitor  Mental Status - awake and alert, with receptive greater than expressive aphasia, not able to follow two-step commands, unable to name objects or repeat phrases except simple ones. She attempts to read your lips.  Motor Strength - The patient's strength was normal in all extremities and pronator drift was absent.  Bulk was normal and fasciculations were absent.   Motor Tone - Muscle tone was assessed at  the neck and appendages and was normal.  Sensory - Light touch, temperature/pinprick were assessed and were symmetrical.    Coordination - unable to assess due to aphasia   Gait and Station - deferred.   ASSESSMENT/PLAN [name] is a 75 y.o. female with history of HTN, HLD admitted for aphasia. S/p TNK.  She is to be discharged to CIR when a bed is available and heart rate is controlled.   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  MRI large inferior MCA infarct with petechial hemorrhage 2D Echo EF 55-60% LE venous doppler no DVT LDL 63 HgbA1c 5.5 UDS neg SCDs for VTE prophylaxis Eliquis started 10/23 ongoing aggressive stroke risk factor management Therapy recommendations:  CIR Disposition: to CIR when bed available and heart  rate controlled  Afib RVR Cardiology following  Continue afib RVR  New diagnosis S/p cardioversion 10/21- unfortunately back in A fib  Started digoxin by cards yesterday Eliquis started 03/03/2021 TEE 10/21 EF 50-55%. No thrombus or vegetation.   Hypertension Off cleviprex On cardizem BP goal < 160  Long term BP goal normotensive Currently continuing midodrine 10 mg TID for hypotension Hyperlipidemia Home meds:  lipitor 10  LDL 63, goal < 70 Now on lipitor 10 Continue statin at discharge  Other Stroke Risk Factors Advanced age Obesity, Body mass index is 31.15 kg/m.    Other Active Problems Cough with sputum production  -Leukocytosis WBC 14.0, afebrile overnight -CXR with no acute process.  - continued on doxycycline for 10 days , stop 11/1  COPD. Oxygen as needed and bronchodilators   I have personally obtained history,examined this patient, reviewed notes, independently viewed imaging studies, participated in medical decision making and plan of care.ROS completed by me personally and pertinent positives fully documented  I have made any additions or clarifications directly to the above note. Agree with note above.  Continue A. fib rate control medications as per cardiology.  Mobilize out of bed.  Continue ongoing therapies.  Transfer to neurology floor bed.  Discussed with critical care nurse practitioner Posey Boyer.  Greater than 50% time during this 35-minute visit was spent on counseling and coordination of care and discussion with care team and answering questions  Delia Heady, MD Medical Director [facility] Pager: [phone] 03/04/2021 12:34 PM  Hospital day # 7  To contact Stroke Continuity provider, please refer to [email or web address]. After hours, contact General Neurology

## 2021-03-04 NOTE — Progress Notes (Signed)
Inpatient Rehabilitation Admissions Coordinator   I continue to follow to await medical readiness for Cir to begin Auth again with [facility] medicare. I will follow.  Ottie Glazier, RN, MSN Rehab Admissions Coordinator [phone] 03/04/2021 11:23 AM

## 2021-03-04 NOTE — Progress Notes (Signed)
Michelle Dean  Code Status: FULL [name] is a 75 y.o. female patient admitted from 4N ICU awake, alert - oriented X1 - no acute distress noted. VSS -  no c/o shortness of breath, no c/o chest pain. Cardiac tele in place. Call light within reach. Skin clean and dry. No evidence of skin break down noted on exam.  ?  Will cont to eval and treat per MD orders.  Jon Gills, RN  03/04/2021

## 2021-03-04 NOTE — Progress Notes (Addendum)
NAME:  Michelle Dean, MRN:  [id number], DOB:  08-24-1945, LOS: 7 ADMISSION DATE:  02/25/2021, CONSULTATION DATE: 02/27/2021 REFERRING MD: Dr. Thomasena Edis, CHIEF COMPLAINT: Aphasia  History of Present Illness:  Michelle Dean is a 75 y.o. female who has a PMH as outlined below.  She presented to any [facility] ED 10/17 with acute onset of confusion and expressive and receptive aphasia.  CT head was negative for hemorrhage.  She received TNKase.  CTA revealed left M2/M3 occlusion for which she was transferred to [facility] for angiogram and possible thrombectomy.   She was taken to IR and had partial recanalization with reocclusion followed by mechanical thrombolysis achieving a TICI 2C revascularization.   10/19, she had A. fib with RVR.  She was started on Cardizem however RVR persisted.  PCCM subsequently asked to see in consultation.  Pertinent  Medical History  has Acute ischemic stroke (HCC); Stroke (cerebrum) (HCC); Middle cerebral artery embolism, left; Aphasia; Atrial fibrillation (HCC); Atrial flutter with rapid ventricular response (HCC); Benign hypertension; and Mixed hyperlipidemia on their problem list.  Significant Hospital Events: Including procedures, antibiotic start and stop dates in addition to other pertinent events   10/17> admit 10/19> PCCM consult for A. fib RVR 10/21 TEE/cardioversion 10/22 productive cough, CXR neg, doxy started  Interim History / Subjective:   Afebrile  Ongoing afib with RVR > 112 - 140's, since getting OOB to chair this morning Pending tx out of ICU Remains on 3L    Objective   Blood pressure 126/74, pulse (!) 143, temperature 98.2 F (36.8 C), temperature source Oral, resp. rate (!) 35, height 5\' 7"  (1.702 m), weight 90.2 kg, SpO2 97 %.        Intake/Output Summary (Last 24 hours) at 03/04/2021 03/06/2021 Last data filed at 03/04/2021 0800 Gross per 24 hour  Intake 120 ml  Output --  Net 120 ml   Filed Weights   02/25/21 1452 02/25/21 1514   Weight: 99.8 kg 90.2 kg   Examination: General:  Older female sitting upright in bed in NAD HEENT: MM pink/moist, pupils 3/reactive Neuro:  Alert, will f/c commands, MAE, ongoing aphasia, seems more expressive than receptive  CV: irir, afib 112-130s, +2 dp PULM:  non labored, coarse, ?slight intermittent bibasilar crackles, faint exp wheeze right base, good cough with small amount of yellow-greenish sputum  GI: soft, bs+, NT Extremities: warm/dry, no LE edema  Skin: no rashes    Inaccurate I/Os, multiple unmeasured urinary occurrences   CTA head 10/17 > Left M2/M3 occlusion. CT 10/18 > left MCA contrast staining, hypodensity in left temporal lobe. MRI brain 10/18 > mod posterior left MCA infarct, mild to mod chronic small vessel ischemic disease. Echo 10/18 > EF 55-60%, G2DD, mildly enlarged RV/LA/RA. LE duplex 10/18 > neg. TSH normal.  Resolved Hospital Problem list     Assessment & Plan:   Left M2/M3 occlusion -S/p TNKase and partial recanalization with reocclusion followed by mechanical thrombolysis achieving a TICI 2C vascularization - Stroke team remains primary  - still needs speech eval for aphasia - PT/ OT -> recs for CIR - pending tx out of ICU today   A. fib with RVR - New onset, s/p TEE/cardioversion 10/21, but has gone back into Afib - per cardiology- will defer management to cardiology> plans for repeat cardioversion 10/25, NPO p midnight and lasix 40 mg today  - continue PO amiodarone and dig - Eliquis since 03/03/21 - goal Mag > 2, K >4 - wean midodrine as able, currently  on 10mg  TID, likely can start weaning 10/25   History of COPD - intermittently on home O2, continue to wean supplemental O2 for sat goal > 90% - continue home dulera, incruse in place of home Sprivia , prn albuterol at home   Cough with sputum production - No infiltrate on chest x-ray 10/22 - Continue doxycycline for 5 days, started 10/22 - Leukocytosis continues to improve,  19.5> 14, remains afebrile   Best Practice (right click and "Reselect all SmartList Selections" daily)   Diet/type: per SLP recs, NPO currently  DVT prophylaxis: DOAC GI prophylaxis: N/A Lines: N/A Foley:  N/A Code Status:  full code Last date of multidisciplinary goals of care discussion: per primary   Pending cardioversion this afternoon, if she transfers out, CCM will sign off and ask TRH to pick up medical needs on floor   Labs   CBC: Recent Labs  Lab 02/25/21 1441 02/26/21 0525 02/28/21 0356 03/01/21 0325 03/02/21 0231 03/03/21 0135 03/04/21 0313  WBC 9.9 12.2* 12.2* 11.1* 23.6* 19.5* 14.0*  NEUTROABS 6.2 10.2*  --   --   --   --   --   HGB 13.6 12.4 11.8* 10.7* 10.8* 10.7* 10.9*  HCT 43.6 38.6 37.9 34.1* 34.2* 34.0* 35.5*  MCV 95.8 93.9 96.9 95.5 95.3 95.8 96.7  PLT 225 222 228 189 203 198 234    Basic Metabolic Panel: Recent Labs  Lab 02/28/21 0356 03/01/21 0325 03/02/21 0046 03/02/21 0231 03/03/21 0135 03/04/21 0313  NA 142 140 138 139 138 139  K 3.8 3.9 3.6 3.9 4.3 3.9  CL 109 107 104 103 104 100  CO2 24 28 26 26 25 28   GLUCOSE 113* 109* 133* 131* 103* 108*  BUN 10 12 13 13 15 11   CREATININE 0.80 0.83 0.97 1.00 0.85 0.84  CALCIUM 8.8* 8.6* 8.6* 8.7* 8.5* 8.9  MG 1.6*  --  1.5*  --  2.0  --    GFR: Estimated Creatinine Clearance: 66.7 mL/min (by C-G formula based on SCr of 0.84 mg/dL). Recent Labs  Lab 03/01/21 0325 03/02/21 0231 03/03/21 0135 03/04/21 0313  WBC 11.1* 23.6* 19.5* 14.0*    Liver Function Tests: Recent Labs  Lab 02/25/21 1441  AST 20  ALT 14  ALKPHOS 56  BILITOT 0.6  PROT 6.8  ALBUMIN 3.9   No results for input(s): LIPASE, AMYLASE in the last 168 hours. No results for input(s): AMMONIA in the last 168 hours.  ABG No results found for: PHART, PCO2ART, PO2ART, HCO3, TCO2, ACIDBASEDEF, O2SAT   Coagulation Profile: Recent Labs  Lab 02/25/21 1441 03/01/21 0325  INR 1.0 1.1    Cardiac Enzymes: No results for  input(s): CKTOTAL, CKMB, CKMBINDEX, TROPONINI in the last 168 hours.  HbA1C: Hgb A1c MFr Bld  Date/Time Value Ref Range Status  02/26/2021 05:25 AM 5.5 4.8 - 5.6 % Final    Comment:    (NOTE) Pre diabetes:          5.7%-6.4%  Diabetes:              >6.4%  Glycemic control for   <7.0% adults with diabetes     CBG: Recent Labs  Lab 02/25/21 1420  GLUCAP 99    CCT: n/a   03/03/21, ACNP [facility] 03/04/2021, 9:39 AM  See Amion for pager If no response to pager, please call PCCM consult pager After 7:00 pm call Elink

## 2021-03-04 NOTE — Progress Notes (Signed)
Physical Therapy Treatment Patient Details Name: Michelle Dean MRN: [id number] DOB: 1945/07/21 Today's Date: 03/04/2021   History of Present Illness 75 yo female presents to [facility] on 10/17 with aphasia. Imaging reveals Left MCA CVA, s/p L common carotid arteriogram with revascularization on 10/17 at [facility].  PMH includes COPD (intermittently uses oxygen at home), hypertension, hyperlipidemia.    PT Comments    Pt received in chair with need to urinate, pt went into bathroom urinated and had BM, after ambulation, pt again had urgent need to void and urinated and had loose BM again. RN notified of frequency. Pt ambulated 53' with RW and min A with decreased attention to lines and environment, needing visual cues as pt does not follow verbal cues due to receptive aphasia. Pt continues to be unsafe to mobilize by herself. HR up to 150 bpm with BP 136/104 and pt symptomatic with feelings of dizziness and the need to return to sitting. RN notified. PT will continue to follow.    Recommendations for follow up therapy are one component of a multi-disciplinary discharge planning process, led by the attending physician.  Recommendations may be updated based on patient status, additional functional criteria and insurance authorization.  Follow Up Recommendations        Assistance Recommended at Discharge    Equipment Recommendations  None recommended by PT    Recommendations for Other Services Rehab consult     Precautions / Restrictions Precautions Precautions: Fall Precaution Comments: receptive aphasia Restrictions Weight Bearing Restrictions: No     Mobility  Bed Mobility Overal bed mobility: Needs Assistance Bed Mobility: Supine to Sit     Supine to sit: Supervision     General bed mobility comments: up in chair    Transfers Overall transfer level: Needs assistance Equipment used: Rolling walker (2 wheels) Transfers: Sit to/from Stand Sit to Stand: Min guard            General transfer comment: for safety and mgmt of lines, performed numerous times from chair and toilet    Ambulation/Gait Ambulation/Gait assistance: Min assist Gait Distance (Feet): 70 Feet Assistive device: Rolling walker (2 wheels) Gait Pattern/deviations: Step-through pattern;Decreased stride length;Drifts right/left Gait velocity: decr Gait velocity interpretation: <1.8 ft/sec, indicate of risk for recurrent falls General Gait Details: HR up to 150 bpm with mobility, after ambulation pt needed to return to bathroom for another BM (RN notified of frequency). Needed visual cues for directions, has difficulty with pathfinding and obstacle navigation   Stairs             Wheelchair Mobility    Modified Rankin (Stroke Patients Only) Modified Rankin (Stroke Patients Only) Pre-Morbid Rankin Score: No symptoms Modified Rankin: Moderately severe disability     Balance Overall balance assessment: Needs assistance Sitting-balance support: No upper extremity supported;Feet supported Sitting balance-Leahy Scale: Fair     Standing balance support: No upper extremity supported;During functional activity Standing balance-Leahy Scale: Fair Standing balance comment: leans against stable surface when does not have UE support                            Cognition Arousal/Alertness: Awake/alert Behavior During Therapy: WFL for tasks assessed/performed Overall Cognitive Status: Impaired/Different from baseline Area of Impairment: Safety/judgement;Awareness;Problem solving;Following commands                       Following Commands: Follows one step commands inconsistently Safety/Judgement: Decreased awareness of safety;Decreased awareness of  deficits Awareness: Emergent Problem Solving: Slow processing;Decreased initiation;Difficulty sequencing General Comments: pt not mindful of body position and lines when mobilizing. Follows verbal commands minimally due to  receptive aphasia.        Exercises General Exercises - Lower Extremity Long Arc Quad: AROM;Both;10 reps;Seated    General Comments General comments (skin integrity, edema, etc.): pt relayed feeling unsteady and "dizzy headed" when HR 150 bpm and BP 136/104, SPO2 remained in 90's on 3L      Pertinent Vitals/Pain Pain Assessment: No/denies pain Faces Pain Scale: No hurt    Home Living                          Prior Function            PT Goals (current goals can now be found in the care plan section) Acute Rehab PT Goals Patient Stated Goal: return home PT Goal Formulation: With patient/family Time For Goal Achievement: 03/12/21 Potential to Achieve Goals: Good Progress towards PT goals: Progressing toward goals    Frequency    Min 4X/week      PT Plan Current plan remains appropriate    Co-evaluation              AM-PAC PT "6 Clicks" Mobility   Outcome Measure  Help needed turning from your back to your side while in a flat bed without using bedrails?: A Little Help needed moving from lying on your back to sitting on the side of a flat bed without using bedrails?: A Little Help needed moving to and from a bed to a chair (including a wheelchair)?: A Little Help needed standing up from a chair using your arms (e.g., wheelchair or bedside chair)?: A Little Help needed to walk in hospital room?: A Little Help needed climbing 3-5 steps with a railing? : A Lot 6 Click Score: 17    End of Session Equipment Utilized During Treatment: Oxygen;Gait belt (3LO2 during gait) Activity Tolerance: Patient tolerated treatment well Patient left: in chair;with call bell/phone within reach;with chair alarm set Nurse Communication: Mobility status PT Visit Diagnosis: Muscle weakness (generalized) (M62.81)     Time: 7793-9030 PT Time Calculation (min) (ACUTE ONLY): 25 min  Charges:  $Gait Training: 8-22 mins $Therapeutic Activity: 8-22 mins                      Lyanne Co, PT  Acute Rehab Services  Pager [phone] Office [phone]    Lawana Chambers Kelsei Defino 03/04/2021, 12:12 PM

## 2021-03-05 ENCOUNTER — Encounter (HOSPITAL_COMMUNITY): Payer: Self-pay | Admitting: Neurology

## 2021-03-05 ENCOUNTER — Inpatient Hospital Stay (HOSPITAL_COMMUNITY): Payer: Medicare Other | Admitting: Certified Registered"

## 2021-03-05 ENCOUNTER — Other Ambulatory Visit (HOSPITAL_COMMUNITY): Payer: Self-pay

## 2021-03-05 ENCOUNTER — Encounter (HOSPITAL_COMMUNITY)
Admission: EM | Disposition: A | Discharge status: Rehabilitation Facility | Payer: Self-pay | Source: Home / Self Care | Attending: Neurology

## 2021-03-05 DIAGNOSIS — I639 Cerebral infarction, unspecified: Secondary | ICD-10-CM | POA: Diagnosis not present

## 2021-03-05 HISTORY — PX: CARDIOVERSION: SHX1299

## 2021-03-05 SURGERY — CARDIOVERSION
Anesthesia: Monitor Anesthesia Care

## 2021-03-05 MED ORDER — SODIUM CHLORIDE 0.9 % IV SOLN: INTRAVENOUS | Status: DC | PRN

## 2021-03-05 MED ORDER — METOPROLOL TARTRATE 5 MG/5ML IV SOLN
2.5000 mg | Freq: Once | INTRAVENOUS | Status: AC
  Administered 2021-03-05: 2.5 mg via INTRAVENOUS

## 2021-03-05 MED ORDER — LIDOCAINE 2% (20 MG/ML) 5 ML SYRINGE
INTRAMUSCULAR | Status: DC | PRN
Start: 2021-03-05 — End: 2021-03-05
  Administered 2021-03-05: 100 mg via INTRAVENOUS

## 2021-03-05 MED ORDER — METOPROLOL TARTRATE 5 MG/5ML IV SOLN
INTRAVENOUS | Status: AC
  Filled 2021-03-05: qty 5

## 2021-03-05 MED ORDER — PROPOFOL 10 MG/ML IV BOLUS
INTRAVENOUS | Status: DC | PRN
  Administered 2021-03-05: 70 mg via INTRAVENOUS

## 2021-03-05 MED ORDER — PHENYLEPHRINE 40 MCG/ML (10ML) SYRINGE FOR IV PUSH (FOR BLOOD PRESSURE SUPPORT)
PREFILLED_SYRINGE | INTRAVENOUS | Status: DC | PRN
  Administered 2021-03-05: 120 ug via INTRAVENOUS

## 2021-03-05 NOTE — CV Procedure (Signed)
Direct current cardioversion 03/05/2021 10:24 AM  Indication symptomatic A. Fibrillation/A. Flutter.  Procedure: Using 70 mg of IV Propofol and 100 IV Lidocaine (for reducing venous pain) for achieving deep sedation, synchronized direct current cardioversion performed. Patient was delivered with 100 Joules of electricity X 1 with success to NSR. Patient tolerated the procedure well. No immediate complication noted.    Yates Decamp, MD, [facility] 03/05/2021, 10:24 AM Office: [phone] Fax: [phone] Pager: [phone]

## 2021-03-05 NOTE — Care Management Important Message (Signed)
Important Message  Patient Details  Name: Michelle Dean MRN: [id number] Date of Birth: [birth year]   Medicare Important Message Given:        Dorena Bodo 03/05/2021, 4:01 PM

## 2021-03-05 NOTE — Anesthesia Postprocedure Evaluation (Signed)
Anesthesia Post Note  Patient: Michelle Dean  Procedure(s) Performed: CARDIOVERSION     Patient location during evaluation: PACU Anesthesia Type: MAC Level of consciousness: awake and alert Pain management: pain level controlled Vital Signs Assessment: post-procedure vital signs reviewed and stable Respiratory status: spontaneous breathing and respiratory function stable Cardiovascular status: stable Postop Assessment: no apparent nausea or vomiting Anesthetic complications: no   No notable events documented.  Last Vitals:  Vitals:   03/05/21 1050 03/05/21 1112  BP: 120/74 (!) 114/56  Pulse: 67 72  Resp: (!) 24 20  Temp:  37.3 C  SpO2: 98% 94%    Last Pain:  Vitals:   03/05/21 1112  TempSrc: Oral  PainSc:                  [name]

## 2021-03-05 NOTE — Progress Notes (Signed)
Inpatient Rehabilitation Admissions Coordinator   I will have to resubmit Auth to [facility] health now that cardioversion complete. Last week patient was denied by [facility] for patient remained acutely ill. I will resubmit  for possible Cir approval.  Ottie Glazier, RN, MSN Rehab Admissions Coordinator [phone] 03/05/2021 4:34 PM

## 2021-03-05 NOTE — Progress Notes (Signed)
STROKE TEAM PROGRESS NOTE   INTERVAL HISTORY Patient is resting comfortably in bed.  Has receptive and expressive aphasia. Seen by PT today, recommend Inpt rehab.  Vitals:   03/05/21 1040 03/05/21 1050 03/05/21 1112 03/05/21 1510  BP: 114/64 120/74 (!) 114/56 121/60  Pulse: 67 67 72 66  Resp: (!) 26 (!) 24 20 20   Temp:   99.1 F (37.3 C) 98 F (36.7 C)  TempSrc:   Oral Oral  SpO2: 98% 98% 94% 100%  Weight:      Height:       CBC:  Recent Labs  Lab 03/03/21 0135 03/04/21 0313  WBC 19.5* 14.0*  HGB 10.7* 10.9*  HCT 34.0* 35.5*  MCV 95.8 96.7  PLT 198 234   Basic Metabolic Panel:  Recent Labs  Lab 03/02/21 0046 03/02/21 0231 03/03/21 0135 03/04/21 0313  NA 138   < > 138 139  K 3.6   < > 4.3 3.9  CL 104   < > 104 100  CO2 26   < > 25 28  GLUCOSE 133*   < > 103* 108*  BUN 13   < > 15 11  CREATININE 0.97   < > 0.85 0.84  CALCIUM 8.6*   < > 8.5* 8.9  MG 1.5*  --  2.0  --    < > = values in this interval not displayed.   Lipid Panel:  No results for input(s): CHOL, TRIG, HDL, CHOLHDL, VLDL, LDLCALC in the last 168 hours. HgbA1c:  No results for input(s): HGBA1C in the last 168 hours. Urine Drug Screen:  No results for input(s): LABOPIA, COCAINSCRNUR, LABBENZ, AMPHETMU, THCU, LABBARB in the last 168 hours.  Alcohol Level  No results for input(s): ETH in the last 168 hours.  IMAGING past 24 hours No results found.  PHYSICAL EXAM  Temp:  [97.5 F (36.4 C)-99.1 F (37.3 C)] 98 F (36.7 C) (10/25 1510) Pulse Rate:  [66-147] 66 (10/25 1510) Resp:  [20-33] 20 (10/25 1510) BP: (98-155)/(53-96) 121/60 (10/25 1510) SpO2:  [94 %-100 %] 100 % (10/25 1510)  General - Well nourished, well developed pleasant elderly Caucasian lady, in no apparent distress.   Cardiovascular - A fib on monitor  Mental Status - awake and alert, with receptive greater than expressive aphasia, not able to follow two-step commands, unable to name objects or repeat phrases except simple  ones. She attempts to read your lips.  Motor Strength - non focal.  Sensory - Light touch symmetrical.    Coordination - unable to assess due to aphasia   Gait and Station - deferred.   ASSESSMENT/PLAN [name] is a 75 y.o. female with history of HTN, HLD admitted for aphasia. S/p TNK.  She is to be discharged to CIR when a bed is available and heart rate is controlled.   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  MRI large inferior MCA infarct with petechial hemorrhage 2D Echo EF 55-60% LE venous doppler no DVT LDL 63 HgbA1c 5.5 UDS neg SCDs for VTE prophylaxis Eliquis started 10/23 ongoing aggressive stroke risk factor management Therapy recommendations:  CIR Disposition: to CIR when bed available and heart rate controlled  Afib RVR Cardiology following  Continue afib RVR  New diagnosis S/p cardioversion 10/21- unfortunately back in A fib  Started digoxin by cards yesterday Eliquis started 03/03/2021 TEE 10/21 EF 50-55%. No thrombus or vegetation.   Hypertension Off cleviprex On cardizem BP goal < 160  Long term BP goal normotensive Currently continuing midodrine 10 mg TID for hypotension Hyperlipidemia Home meds:  lipitor 10  LDL 63, goal < 70 Now on lipitor 10 Continue statin at discharge  Other Stroke Risk Factors Advanced age Obesity, Body mass index is 31.15 kg/m.    Other Active Problems Cough with sputum production  -Leukocytosis WBC 14.0, afebrile overnight -CXR with no acute process.  - continued on doxycycline for 10 days , stop 11/1  COPD. Oxygen as needed and bronchodilators   Stable. Seen by PT. Ready for CIR, bed  availability pending. Con't eliquis.  Total of 30 mins spent reviewing chart, discussion with patient and family on prognosis, Dx and plan. Discussed case with patient's nurse. Reviewed Imaging personally.   Hospital day # 8  To contact Stroke Continuity provider, please refer to [email or web address]. After hours, contact General Neurology

## 2021-03-05 NOTE — Transfer of Care (Signed)
Immediate Anesthesia Transfer of Care Note  Patient: Michelle Dean  Procedure(s) Performed: CARDIOVERSION  Patient Location: Endoscopy Unit  Anesthesia Type:General  Level of Consciousness: lethargic  Airway & Oxygen Therapy: Patient Spontanous Breathing and Patient connected to nasal cannula oxygen  Post-op Assessment: Report given to RN  Post vital signs: Reviewed and stable  Last Vitals:  Vitals Value Taken Time  BP 105/66   Temp    Pulse 65   Resp 23   SpO2 94     Last Pain:  Vitals:   03/05/21 0958  TempSrc: Temporal  PainSc: 0-No pain         Complications: No notable events documented.

## 2021-03-05 NOTE — Anesthesia Preprocedure Evaluation (Signed)
Anesthesia Evaluation    Reviewed: Allergy & Precautions, Patient's Chart, lab work & pertinent test results  Airway Mallampati: II  TM Distance: >3 FB     Dental   Pulmonary neg pulmonary ROS,    breath sounds clear to auscultation       Cardiovascular hypertension, + dysrhythmias Atrial Fibrillation  Rhythm:Regular Rate:Normal  LV: Low normal 50-55%. RV: Grossly normal structure and function.  LA: Visually dilated. Spontaneous echo contrast was not present.  No thrombus present. Left atrial appendage: Spontaneous echo contrast was not present.  No thrombus present. Inter atrial septum is intact but redundant, bubble study negative for atrial level shunting. RA: Grossly normal.   MV: mild regurgitation,no stenosis, no vegetation noted.  TV: mild regurgitation,no stenosis, no vegetation noted. AV: no regurgitation,no stenosis, no vegetation noted.   PV: no regurgitation, no stenosis, no vegetation noted.  Thoracic and ascending aorta: Plaque present.   Neuro/Psych CVA    GI/Hepatic negative GI ROS, Neg liver ROS,   Endo/Other  negative endocrine ROS  Renal/GU negative Renal ROS     Musculoskeletal   Abdominal   Peds  Hematology  (+) anemia ,   Anesthesia Other Findings   Reproductive/Obstetrics                             Anesthesia Physical  Anesthesia Plan  ASA: 3  Anesthesia Plan: MAC   Post-op Pain Management:    Induction: Intravenous  PONV Risk Score and Plan: 2 and Treatment may vary due to age or medical condition  Airway Management Planned: Mask  Additional Equipment:   Intra-op Plan:   Post-operative Plan:   Informed Consent:   Plan Discussed with: Anesthesiologist  Anesthesia Plan Comments:         Anesthesia Quick Evaluation

## 2021-03-05 NOTE — Progress Notes (Signed)
Physical Therapy Treatment Patient Details Name: Michelle Dean MRN: [id number] DOB: 01-21-1946 Today's Date: 03/05/2021   History of Present Illness 75 yo female presents to [facility] on 10/17 with aphasia. Imaging reveals Left MCA CVA, s/p L common carotid arteriogram with revascularization on 10/17 at [facility]. PMH: COPD (intermittently uses oxygen at home), hypertension, hyperlipidemia.    PT Comments    Pt received in chair, pleasantly agreeable to therapy session and with continued receptive aphasia, pt benefits from verbal as well as gestures for cues  for activity sequencing/directional navigation. Emphasis on safe hand placement with transfers, safe use of assistive device, standing balance tasks, use of incentive spirometer and activity pacing. Pt continues to benefit from PT services to progress toward functional mobility goals. Continue to recommend CIR.  Recommendations for follow up therapy are one component of a multi-disciplinary discharge planning process, led by the attending physician.  Recommendations may be updated based on patient status, additional functional criteria and insurance authorization.  Follow Up Recommendations  Acute inpatient rehab (3hours/day)     Assistance Recommended at Discharge    Equipment Recommendations  None recommended by PT (defer to post-acute)    Recommendations for Other Services Rehab consult     Precautions / Restrictions Precautions Precautions: Fall Precaution Comments: receptive aphasia, HoH Restrictions Weight Bearing Restrictions: No     Mobility  Bed Mobility   General bed mobility comments: up in chair    Transfers Overall transfer level: Needs assistance Equipment used: Rolling walker (2 wheels) Transfers: Sit to/from Stand Sit to Stand: Min guard     General transfer comment: for safety and mgmt of lines, performed from chair and toilet heights to RW, needs hand over hand assist for proper hand placement     Ambulation/Gait Ambulation/Gait assistance: Min assist Gait Distance (Feet): 80 Feet (58ft + 59ft with seated break in bathroom) Assistive device: Rolling walker (2 wheels) Gait Pattern/deviations: Step-through pattern;Decreased stride length;Drifts right/left Gait velocity: decr Gait velocity interpretation: <1.8 ft/sec, indicate of risk for recurrent falls General Gait Details: Pt needed visual/verbal cues for directions, has difficulty with pathfinding and obstacle navigation, seh was bumping RW into sink and tended to abandon it at times rather than turning away from obstacles. SpO2/HR WFL on 3.5L O2 [place] with exertion. DOE 2/4.    Modified Rankin (Stroke Patients Only) Modified Rankin (Stroke Patients Only) Pre-Morbid Rankin Score: No symptoms Modified Rankin: Moderately severe disability     Balance Overall balance assessment: Needs assistance Sitting-balance support: No upper extremity supported;Feet supported Sitting balance-Leahy Scale: Fair     Standing balance support: No upper extremity supported;During functional activity Standing balance-Leahy Scale: Fair Standing balance comment: needs at least U UE support for dynamic tasks       Cognition Arousal/Alertness: Awake/alert Behavior During Therapy: WFL for tasks assessed/performed Overall Cognitive Status: Impaired/Different from baseline Area of Impairment: Safety/judgement;Awareness;Problem solving;Following commands       Following Commands: Follows one step commands inconsistently Safety/Judgement: Decreased awareness of safety;Decreased awareness of deficits Awareness: Emergent Problem Solving: Slow processing;Decreased initiation;Difficulty sequencing General Comments: pt not mindful of body position and lines when mobilizing. Follows verbal commands minimally due to receptive aphasia so used verbal/visual cues and some hand over hand assist for RW management and directional navigation        Exercises  Other Exercises Other Exercises: IS x10 reps pt pulling 500-950 mL (encouraged hourly use but will reinforce due to receptive aphasia)    General Comments General comments (skin integrity, edema, etc.): HR 70's  bpm and SpO2 95% on 3.5L O2 [place] post-exertion      Pertinent Vitals/Pain Pain Assessment: No/denies pain Faces Pain Scale: No hurt Pain Intervention(s): Monitored during session;Repositioned     PT Goals (current goals can now be found in the care plan section) Acute Rehab PT Goals Patient Stated Goal: return home PT Goal Formulation: With patient/family Time For Goal Achievement: 03/12/21 Progress towards PT goals: Progressing toward goals    Frequency    Min 4X/week      PT Plan Current plan remains appropriate   AM-PAC PT "6 Clicks" Mobility   Outcome Measure  Help needed turning from your back to your side while in a flat bed without using bedrails?: A Little Help needed moving from lying on your back to sitting on the side of a flat bed without using bedrails?: A Little Help needed moving to and from a bed to a chair (including a wheelchair)?: A Little Help needed standing up from a chair using your arms (e.g., wheelchair or bedside chair)?: A Little Help needed to walk in hospital room?: A Little Help needed climbing 3-5 steps with a railing? : A Lot 6 Click Score: 17    End of Session Equipment Utilized During Treatment: Oxygen (3.5L O2 during gait) Activity Tolerance: Patient tolerated treatment well Patient left: in chair;with call bell/phone within reach;with chair alarm set Nurse Communication: Mobility status PT Visit Diagnosis: Muscle weakness (generalized) (M62.81)     Time: 2409-7353 PT Time Calculation (min) (ACUTE ONLY): 19 min  Charges:  $Gait Training: 8-22 mins                     Anis Degidio P., PTA Acute Rehabilitation Services Pager: [phone] Office: [phone]    Dorathy Kinsman Marcellas Marchant 03/05/2021, 2:55 PM

## 2021-03-05 NOTE — Interval H&P Note (Signed)
History and Physical Interval Note:  03/05/2021 10:16 AM  [name]  has presented today for surgery, with the diagnosis of afib.  The various methods of treatment have been discussed with the patient and family. After consideration of risks, benefits and other options for treatment, the patient has consented to  Procedure(s): CARDIOVERSION (N/A) as a surgical intervention.  The patient's history has been reviewed, patient examined, no change in status, stable for surgery.  I have reviewed the patient's chart and labs.  Questions were answered to the patient's satisfaction.     Yates Decamp

## 2021-03-05 NOTE — Progress Notes (Signed)
PT Cancellation Note  Patient Details Name: Michelle Dean MRN: [id number] DOB: February 03, 1946   Cancelled Treatment:    Reason Eval/Treat Not Completed: (P) Patient at procedure or test/unavailable Per RN, pt off floor for cardioversion, will continue efforts per PT plan of care as schedule permits.   Dorathy Kinsman Farhan Jean 03/05/2021, 9:56 AM

## 2021-03-06 DIAGNOSIS — I639 Cerebral infarction, unspecified: Secondary | ICD-10-CM | POA: Diagnosis not present

## 2021-03-06 LAB — BASIC METABOLIC PANEL
Anion gap: 11 (ref 5–15)
BUN: 21 mg/dL (ref 8–23)
CO2: 33 mmol/L — ABNORMAL HIGH (ref 22–32)
Calcium: 8.8 mg/dL — ABNORMAL LOW (ref 8.9–10.3)
Chloride: 95 mmol/L — ABNORMAL LOW (ref 98–111)
Creatinine, Ser: 1.17 mg/dL — ABNORMAL HIGH (ref 0.44–1.00)
GFR, Estimated: 48 mL/min — ABNORMAL LOW (ref >60)
Glucose, Bld: 90 mg/dL (ref 70–99)
Potassium: 4 mmol/L (ref 3.5–5.1)
Sodium: 139 mmol/L (ref 135–145)

## 2021-03-06 LAB — CBC
HCT: 35.1 % — ABNORMAL LOW (ref 36.0–46.0)
Hemoglobin: 11.1 g/dL — ABNORMAL LOW (ref 12.0–15.0)
MCH: 30.1 pg (ref 26.0–34.0)
MCHC: 31.6 g/dL (ref 30.0–36.0)
MCV: 95.1 fL (ref 80.0–100.0)
Platelets: 307 10*3/uL (ref 150–400)
RBC: 3.69 MIL/uL — ABNORMAL LOW (ref 3.87–5.11)
RDW: 13.6 % (ref 11.5–15.5)
WBC: 12.1 10*3/uL — ABNORMAL HIGH (ref 4.0–10.5)
nRBC: 0 % (ref 0.0–0.2)

## 2021-03-06 MED ORDER — THROMBIN (RECOMBINANT) 20000 UNITS EX SOLR
CUTANEOUS | Status: AC
  Filled 2021-03-06: qty 20000

## 2021-03-06 NOTE — Progress Notes (Signed)
STROKE TEAM PROGRESS NOTE   INTERVAL HISTORY Patient is resting comfortably in bed.  Today is her birthday.  She reports feeling dizzy.  She underwent cardioversion yesterday and is now in NSR.  She is able to converse more fluently today.  Vitals:   03/06/21 0353 03/06/21 0553 03/06/21 0737 03/06/21 0807  BP: (!) 111/52 122/61 (!) 121/56   Pulse: 67  66 83  Resp: 16   20  Temp: 98.3 F (36.8 C)  98.3 F (36.8 C)   TempSrc: Oral  Oral   SpO2: 97%  93% 96%  Weight:      Height:       CBC:  Recent Labs  Lab 03/03/21 0135 03/04/21 0313  WBC 19.5* 14.0*  HGB 10.7* 10.9*  HCT 34.0* 35.5*  MCV 95.8 96.7  PLT 198 234    Basic Metabolic Panel:  Recent Labs  Lab 03/02/21 0046 03/02/21 0231 03/03/21 0135 03/04/21 0313  NA 138   < > 138 139  K 3.6   < > 4.3 3.9  CL 104   < > 104 100  CO2 26   < > 25 28  GLUCOSE 133*   < > 103* 108*  BUN 13   < > 15 11  CREATININE 0.97   < > 0.85 0.84  CALCIUM 8.6*   < > 8.5* 8.9  MG 1.5*  --  2.0  --    < > = values in this interval not displayed.    Lipid Panel:  No results for input(s): CHOL, TRIG, HDL, CHOLHDL, VLDL, LDLCALC in the last 168 hours. HgbA1c:  No results for input(s): HGBA1C in the last 168 hours. Urine Drug Screen:  No results for input(s): LABOPIA, COCAINSCRNUR, LABBENZ, AMPHETMU, THCU, LABBARB in the last 168 hours.  Alcohol Level  No results for input(s): ETH in the last 168 hours.  IMAGING past 24 hours No results found.  PHYSICAL EXAM  Temp:  [97.7 F (36.5 C)-99.1 F (37.3 C)] 98.3 F (36.8 C) (10/26 0737) Pulse Rate:  [62-83] 83 (10/26 0807) Resp:  [16-26] 20 (10/26 0807) BP: (109-152)/(48-75) 121/56 (10/26 0737) SpO2:  [93 %-100 %] 96 % (10/26 0807)  General - Well nourished, well developed pleasant elderly Caucasian lady, in no apparent distress.    NEURO:  Mental Status: AA&Ox3  Speech/Language: Aphasia is improved today.  Patient is able to converse with some difficulties in word finding and  is able to name some objects.she named my pen as a pencil. She opened and closed her eyes to command.  Cranial Nerves:  II: PERRL. Visual fields full.  III, IV, VI: EOMI. Eyelids elevate symmetrically.  V: Sensation is intact to light touch and symmetrical to face.  VII: Smile is symmetrical. Able to puff cheeks and raise eyebrows.  VIII: hearing intact to voice. IX, X: Palate elevates symmetrically. Phonation is normal.  NL:ZJQBHALP shrug 5/5. XII: tongue is midline without fasciculations. Motor: non focal. Sensation- Intact to light touch bilaterally. Extinction absent to light touch to DSS. Sharp/Dull   Vibration.   Coordination: FTN intact bilaterally, HKS: no ataxia in BLE.No drift.   Gait- deferred     ASSESSMENT/PLAN [name] is a 75 y.o. female with history of HTN, HLD admitted for aphasia. S/p TNK.  Cardioversion has been performed, and patient is now in NSR.  She is to be discharged to CIR when a bed is available.   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  MRI large inferior MCA infarct with petechial hemorrhage 2D Echo EF 55-60% LE venous doppler no DVT LDL 63 HgbA1c 5.5 UDS neg SCDs for VTE prophylaxis Eliquis started 10/23 ongoing aggressive stroke risk factor management Therapy recommendations:  CIR Disposition: to CIR when bed available and heart rate controlled  Afib RVR Cardiology following  Now in NSR New diagnosis S/p cardioversion 10/21- unfortunately unsuccessful. Repeat cardioversion 10/25 successful. She is in NSR. Cardizem for rhythm/rate control Eliquis started 03/03/2021 TEE 10/21 EF 50-55%. No thrombus or vegetation.    Hypertension Off cleviprex BP goal < 160  Long term BP goal normotensive Currently continuing midodrine 10 mg TID for hypotension  Hyperlipidemia Home meds:  lipitor 10  LDL 63, goal < 70 Now on lipitor 10 Continue statin at discharge  Other Stroke Risk Factors Advanced age Obesity, Body mass index is 31.15 kg/m.    Other Active Problems Cough with sputum production  -Leukocytosis WBC 12.1, afebrile overnight -CXR with no acute process.  - continued on doxycycline for 10 days , stop 11/1  COPD. Oxygen as needed and bronchodilators     Hospital day # 9   Total of 30 mins spent reviewing chart, discussion with patient and family on prognosis, Dx and plan. Discussed case with patient's nurse. Reviewed Imaging personally.   To contact Stroke Continuity provider, please refer to [email or web address]. After hours, contact General Neurology

## 2021-03-06 NOTE — Progress Notes (Signed)
Progress Note  Patient Name: Michelle Dean Date of Encounter: 03/07/2021  Attending physician: Stroke, Md, MD Primary care provider: System, Provider Not In Primary Cardiologist: NA Consultant:Debarah Mccumbers Odis Hollingshead, DO  Subjective: Michelle Dean is a 75 y.o. female who was seen and examined at bedside Denies any chest pain or shortness of breath. Is conversing more compared to last week. Remains in normal sinus rhythm  Objective: Vital Signs in the last 24 hours: Temp:  [97.9 F (36.6 C)-98.7 F (37.1 C)] 98.2 F (36.8 C) (10/27 0800) Pulse Rate:  [56-63] 62 (10/27 0800) Resp:  [16-20] 20 (10/27 0800) BP: (110-143)/(46-76) 110/76 (10/27 0800) SpO2:  [95 %-97 %] 96 % (10/27 0800)  Intake/Output: No intake or output data in the 24 hours ending 03/07/21 0905   Net IO Since Admission: 5,205.13 mL [03/07/21 0905]  Weights:  Filed Weights   02/25/21 1452 02/25/21 1514  Weight: 99.8 kg 90.2 kg    Telemetry: Personally reviewed.  Sinus rhythm with PVCs  Physical examination: PHYSICAL EXAM: Vitals with BMI 03/07/2021 03/07/2021 03/07/2021  Height - - -  Weight - - -  BMI - - -  Systolic 110 114 160  Diastolic 76 56 46  Pulse 62 63 56    CONSTITUTIONAL: Resting in bed comfortably, no acute distress, appears older than stated age. SKIN: Skin is warm and dry. No rash noted. No cyanosis. No pallor. No jaundice HEAD: Normocephalic and atraumatic.  EYES: No scleral icterus MOUTH/THROAT: Moist oral membranes.  NECK: No JVD present. No thyromegaly noted. No carotid bruits  LYMPHATIC: No visible cervical adenopathy.  CHEST Normal respiratory effort. No intercostal retractions  LUNGS: Clear to auscultation bilaterally. no stridor. No wheezes. No rales.  CARDIOVASCULAR: Regular, positive S1-S2, extrasystolic beats, no murmurs rubs or gallops appreciated  ABDOMINAL: Soft, nontender, nondistended, positive bowel sounds in all 4 quadrants, no apparent ascites.  EXTREMITIES: No peripheral  edema  HEMATOLOGIC: No significant bruising NEUROLOGIC: Moving all 4 extremities, strength at least 3+, expressive and receptive aphasia improving compared to last week. PSYCHIATRIC: Normal mood and affect. Normal behavior. Cooperative  Lab Results: Hematology Recent Labs  Lab 03/04/21 0313 03/06/21 1012 03/07/21 0250  WBC 14.0* 12.1* 11.3*  RBC 3.67* 3.69* 3.69*  HGB 10.9* 11.1* 11.2*  HCT 35.5* 35.1* 34.8*  MCV 96.7 95.1 94.3  MCH 29.7 30.1 30.4  MCHC 30.7 31.6 32.2  RDW 13.9 13.6 13.4  PLT 234 307 290    Chemistry Recent Labs  Lab 03/04/21 0313 03/06/21 1012 03/07/21 0250  NA 139 139 138  K 3.9 4.0 3.7  CL 100 95* 96*  CO2 28 33* 34*  GLUCOSE 108* 90 90  BUN 11 21 24*  CREATININE 0.84 1.17* 1.15*  CALCIUM 8.9 8.8* 9.0  GFRNONAA >60 48* 49*  ANIONGAP 11 11 8      Cardiac Enzymes: Cardiac Panel (last 3 results) No results for input(s): CKTOTAL, CKMB, TROPONINIHS, RELINDX in the last 72 hours.  BNP (last 3 results) Recent Labs    03/04/21 0313 03/07/21 0250  BNP 225.7* 126.4*    ProBNP (last 3 results) No results for input(s): PROBNP in the last 8760 hours.   DDimer No results for input(s): DDIMER in the last 168 hours.   Hemoglobin A1c:  Lab Results  Component Value Date   HGBA1C 5.5 02/26/2021   MPG 111.15 02/26/2021    TSH  Recent Labs    02/27/21 1211  TSH 0.633    Lipid Panel     Component Value Date/Time  CHOL 131 02/26/2021 0525   TRIG 55 02/26/2021 0525   HDL 57 02/26/2021 0525   CHOLHDL 2.3 02/26/2021 0525   VLDL 11 02/26/2021 0525   LDLCALC 63 02/26/2021 0525    Imaging: No results found.  Cardiac database: EKG: 02/27/2021: Atrial flutter 2:1, 130bpm, right bundle branch block.  No prior EKGs available for comparison.  03/05/2021: Normal sinus rhythm, 66 bpm, right bundle branch block without underlying injury pattern.   Echocardiogram: 02/26/2021: 1. Left ventricular ejection fraction, by estimation, is 55 to  60%. The  left ventricle has normal function. The left ventricle has no regional  wall motion abnormalities. Left ventricular diastolic parameters are  consistent with Grade II diastolic dysfunction (pseudonormalization).   2. Right ventricular systolic function is normal. The right ventricular  size is mildly enlarged. Tricuspid regurgitation signal is inadequate for  assessing PA pressure.   3. Left atrial size was mildly dilated.   4. Right atrial size was mildly dilated.   5. The mitral valve is normal in structure. No evidence of mitral valve regurgitation. No evidence of mitral stenosis.   6. The aortic valve is tricuspid. Aortic valve regurgitation is not visualized. Mild aortic valve sclerosis is present, with no evidence of aortic valve stenosis.   7. The inferior vena cava is dilated in size with >50% respiratory variability, suggesting right atrial pressure of 8 mmHg.   TEE guided DDCV:  03/01/2021:  1. Left ventricular ejection fraction, by estimation, is 50 to 55%. The  left ventricle has low normal function. The left ventricle has no regional  wall motion abnormalities.   2. Right ventricular systolic function is normal. The right ventricular  size is mildly enlarged.   3. Left atrial size was mildly dilated. No left atrial/left atrial  appendage thrombus was detected. The LAA emptying velocity was 61 cm/s.   4. Right atrial size was mildly dilated.   5. The mitral valve is normal in structure. Mild mitral valve  regurgitation. No evidence of mitral stenosis.   6. The aortic valve is normal in structure. Aortic valve regurgitation is  not visualized. No aortic stenosis is present.   7. There is mild (Grade II) plaque involving the descending aorta.   8. Agitated saline contrast bubble study was negative, with no evidence  of any interatrial shunt A successful direct current cardioversion was performed at 100 joules with 1 attempt. Transitioned to normal sinus post direct  current cardioversion; however, converted back to atrial fibrillation by the time she was in post-procedural holding.   DCCV: 03/05/2021: 100J x 1 Converted to SR.   Scheduled Meds:  amiodarone  200 mg Oral Daily   apixaban  5 mg Oral BID   atorvastatin  10 mg Oral Daily   Chlorhexidine Gluconate Cloth  6 each Topical Daily   magnesium oxide  400 mg Oral Daily   midodrine  5 mg Oral TID WC   mometasone-formoterol  2 puff Inhalation BID   multivitamin with minerals  1 tablet Oral Daily   umeclidinium bromide  1 puff Inhalation Daily   verapamil  40 mg Oral Q8H    Continuous Infusions:  sodium chloride Stopped (03/02/21 1000)    PRN Meds: acetaminophen **OR** acetaminophen (TYLENOL) oral liquid 160 mg/5 mL **OR** acetaminophen, albuterol, labetalol **AND** [DISCONTINUED] clevidipine, senna-docusate   IMPRESSION & RECOMMENDATIONS: Michelle Dean is a 75 y.o. female whose past medical history and cardiac risk factors include: Recent stroke (M2/M3 occlusion) s/p TNKase followed by partial recannulation with reocclusion  followed by mechanical thrombolysis achieving TICI 2C revascularization, HTN, HLD, COPD on intermittent oxygen support, postmenopausal female, advanced age.  Paroxysmal atrial fibrillation  Rate control: Verapamil Rhythm control: Amiodarone Thromboembolic prophylaxis: Eliquis [id number] SCORE is 5 which correlates to 7.2% risk of stroke per year.   Status post TEE guided cardioversion on 03/01/2021 -converted to sinus rhythm but shortly thereafter went back into A. Fib and due to controlled A. fib with RVR and acute diastolic heart failure exacerbation she underwent a repeat direct-current cardioversion on 03/05/2021 which restored sinus rhythm.   Patient does have PVCs on telemetry.  Would like to uptitrate verapamil but will hold off today as the patient's blood pressure is within acceptable range but has been given midodrine 3 times daily.  If patient's blood pressure  remains stable with reduced midodrine support recommend up titration of verapamil.  Long-term oral anticoagulation: Initially on IV heparin now transition to oral anticoagulation. Risks, benefits and alternatives have been discussed with the patient and her family during the current hospitalization.  Long-term antiarrhythmic medications: In the setting of A. fib with RVR and diastolic heart failure patient was placed on amiodarone during his hospitalization. Will reduce the dose of amiodarone to 200 mg p.o. daily Will need longitudinal follow-up given the medication side effect profile  Acute heart failure with preserved EF: Most likely secondary to prolonged A. fib with RVR. BNP improving. Appears euvolemic on physical examination. Medications reconciled.  Iatrogenic hypotension: Resolved. Decrease midodrine to 5 mg p.o. 3 times daily with holding parameters  Left MCA stroke: Suggestive of cardioembolic stroke with expressive aphasia Neurology following  Hyperlipidemia:  LDL is less than 70mg /dL Continue statin therapy. Most of his recent lipid profile reviewed.  Continue your care with regards to the management of her other chronic co-morbid conditions and active problem list.  From a cardiovascular standpoint she can be transitioned to inpatient rehab please reconsult if any questions or concerns arise given her care.  Thank you for allowing [place] to participate in the care of Ms. Huante.  This note was created using a voice recognition software as a result there may be grammatical errors inadvertently enclosed that do not reflect the nature of this encounter. Every attempt is made to correct such errors.  Total time spent: 35 minutes  Michelle Nabor Gilson, DO, [facility] Office: [phone] 03/07/2021, 9:05 AM

## 2021-03-06 NOTE — Progress Notes (Addendum)
Tx performed by SPTA under direct guidance and supervision of PTA at all times. This session was performed under the supervision of a licensed clinician. Charting reviewed for accuracy and depicts tx performed and services provided.  Florina Ou., PTA Acute Rehabilitation Services Pager: [phone] Office: [phone]   ----------------------------------------- Physical Therapy Treatment Patient Details Name: Michelle Dean MRN: [id number] DOB: [birth year] Today's Date: 03/06/2021   History of Present Illness 75 yo female presents to [facility] on 10/17 with aphasia. Imaging reveals Left MCA CVA, s/p L common carotid arteriogram with revascularization on 10/17 at [facility]. PMH: COPD (intermittently uses oxygen at home), hypertension, hyperlipidemia.    PT Comments    Patient received upright in chair with son and granddaughter present and was agreeable to therapy session. Son mentioned that pt had complained of some dizziness, monitored BP throughout session and remained stable. Pt performed gait trial with RW and up to minA with DOE 2/4. Pt with some expressive aphasia today unable to state name/DOB when cued aside from first name. Pt unstable/unsafe with RW needs manual cues/assist not to lift it off the floor. She was able to state she was tired at the end of the session and was left in chair with her dinner that had been delivered. Pt continues to benefit from PT services to progress toward functional mobility goals.      Recommendations for follow up therapy are one component of a multi-disciplinary discharge planning process, led by the attending physician.  Recommendations may be updated based on patient status, additional functional criteria and insurance authorization.  Follow Up Recommendations  Acute inpatient rehab (3hours/day)     Assistance Recommended at Discharge    Equipment Recommendations  None recommended by PT    Recommendations for Other Services Rehab consult      Precautions / Restrictions Precautions Precautions: Fall Precaution Comments: receptive aphasia, [facility] Restrictions Weight Bearing Restrictions: No     Mobility  Bed Mobility      General bed mobility comments: in chair    Transfers Overall transfer level: Needs assistance Equipment used: Rolling walker (2 wheels) Transfers: Sit to/from Stand Sit to Stand: Supervision      General transfer comment: For safety, no physical assist needed, pt picking RW up off of floor during turns    Ambulation/Gait Ambulation/Gait assistance: Min assist Gait Distance (Feet): 30 Feet (15 ft, rest break in bathroom) Assistive device: Rolling walker (2 wheels) Gait Pattern/deviations: Step-through pattern;Decreased stride length;Drifts right/left Gait velocity: decreased   General Gait Details: Pt responds better to visual cues for directions using RW, DOE 2/4      Modified Rankin (Stroke Patients Only) Modified Rankin (Stroke Patients Only) Pre-Morbid Rankin Score: No symptoms Modified Rankin: Moderately severe disability     Balance Overall balance assessment: Needs assistance Sitting-balance support: No upper extremity supported;Feet supported Sitting balance-Leahy Scale: Fair     Standing balance support: No upper extremity supported;During functional activity Standing balance-Leahy Scale: Fair       Cognition Arousal/Alertness: Awake/alert Behavior During Therapy: WFL for tasks assessed/performed Overall Cognitive Status: Impaired/Different from baseline Area of Impairment: Safety/judgement;Awareness;Problem solving;Following commands        Following Commands: Follows one step commands inconsistently Safety/Judgement: Decreased awareness of safety;Decreased awareness of deficits Awareness: Emergent Problem Solving: Slow processing;Decreased initiation;Difficulty sequencing (Does well with physical gestures) General Comments: continues to have receptive difficulties,  unable to state her last name without hints. Improved management of RW but still limited awareness of environmetnal obstacles  Exercises Other Exercises Other Exercises: Sit to stands x5    General Comments General comments (skin integrity, edema, etc.): 3L nasal canula throughout session, BP 146/89 sitting, 167/76 after exertion      Pertinent Vitals/Pain Pain Assessment: No/denies pain Pain Intervention(s): Monitored during session     PT Goals (current goals can now be found in the care plan section) Acute Rehab PT Goals Patient Stated Goal: return home PT Goal Formulation: With patient/family Time For Goal Achievement: 03/12/21 Progress towards PT goals: Progressing toward goals    Frequency    Min 4X/week      PT Plan Current plan remains appropriate       AM-PAC PT "6 Clicks" Mobility   Outcome Measure  Help needed turning from your back to your side while in a flat bed without using bedrails?: A Little Help needed moving from lying on your back to sitting on the side of a flat bed without using bedrails?: A Little Help needed moving to and from a bed to a chair (including a wheelchair)?: A Little Help needed standing up from a chair using your arms (e.g., wheelchair or bedside chair)?: A Little Help needed to walk in hospital room?: A Little Help needed climbing 3-5 steps with a railing? : A Lot 6 Click Score: 17    End of Session Equipment Utilized During Treatment: Gait belt;Oxygen Activity Tolerance: Patient tolerated treatment well Patient left: in chair;with chair alarm set;with family/visitor present;with call bell/phone within reach Nurse Communication: Mobility status (Patient may need bath this evening) PT Visit Diagnosis: Muscle weakness (generalized) (M62.81)     Time: 9450-3888 PT Time Calculation (min) (ACUTE ONLY): 19 min  Charges:  $Gait Training: 8-22 mins                     Harland German, Student PTA CI: Jinger Neighbors 03/06/2021, 5:36 PM

## 2021-03-06 NOTE — Progress Notes (Signed)
Occupational Therapy Treatment Patient Details Name: Michelle Dean MRN: [id number] DOB: 26-Oct-1945 Today's Date: 03/06/2021   History of present illness 75 yo female presents to [facility] on 10/17 with aphasia. Imaging reveals Left MCA CVA, s/p L common carotid arteriogram with revascularization on 10/17 at [facility]. PMH: COPD (intermittently uses oxygen at home), hypertension, hyperlipidemia.   OT comments  Michelle Dean is progressing well. She continues to be limited by activity tolerance, receptive communication difficulties and poor safety/insight. Pt was min guard-supervision for transfers and mobility with poor management of RW and benefits from visual cues to correct. Attempted ambulation without RW from bathroom>sink, pt notably unsteady and required RW for safety; proceeded to pick RW up off of ground for all turns. Supervision for all toielting tasks with use do BSC over toilet. Pt on 3L [place] throughout session with O2 90-94%, pt reported not feeling well at the end of the session but did not describe further. Pt continues to benefit. D/c recommendation is appropriate to maximize safety and indep prior to home.    Recommendations for follow up therapy are one component of a multi-disciplinary discharge planning process, led by the attending physician.  Recommendations may be updated based on patient status, additional functional criteria and insurance authorization.    Follow Up Recommendations  Acute inpatient rehab (3hours/day)    Assistance Recommended at Discharge Intermittent Supervision/Assistance        Precautions / Restrictions Precautions Precautions: Fall Precaution Comments: receptive aphasia, HoH Restrictions Weight Bearing Restrictions: No       Mobility Bed Mobility               General bed mobility comments: in chair    Transfers Overall transfer level: Needs assistance Equipment used: Rolling walker (2 wheels) Transfers: Sit to/from Stand Sit to Stand:  Supervision           General transfer comment: for safety - pt following commands for line management well. no physical assist needed, poor management of RW noteded. pt picking RW up off of floor during turns     Balance Overall balance assessment: Needs assistance Sitting-balance support: No upper extremity supported;Feet supported Sitting balance-Leahy Scale: Fair     Standing balance support: No upper extremity supported;During functional activity Standing balance-Leahy Scale: Fair                             ADL either performed or assessed with clinical judgement   ADL Overall ADL's : Needs assistance/impaired                         Toilet Transfer: Supervision/safety;Rolling walker (2 wheels) Toilet Transfer Details (indicate cue type and reason): supervision for safety - pt with poor management of RW Toileting- Clothing Manipulation and Hygiene: Supervision/safety Toileting - Clothing Manipulation Details (indicate cue type and reason): for safety             Vision   Vision Assessment?: No apparent visual deficits   Perception     Praxis      Cognition Arousal/Alertness: Awake/alert Behavior During Therapy: WFL for tasks assessed/performed Overall Cognitive Status: Impaired/Different from baseline Area of Impairment: Safety/judgement;Awareness;Problem solving;Following commands                       Following Commands: Follows one step commands inconsistently Safety/Judgement: Decreased awareness of safety;Decreased awareness of deficits Awareness: Emergent Problem Solving: Slow processing;Decreased initiation;Difficulty sequencing General  Comments: continues to have receptive difficulties, unable to answer simple questions but able to state her needs/wants. Poor management of RW, and limited awareness of environmetnal obstacles          Exercises Exercises: Other exercises   Shoulder Instructions       General  Comments 3L [place] throughout session, SpO2 90-94%. Pt reported "i dont feel well" at the end of the session    Pertinent Vitals/ Pain       Pain Assessment: No/denies pain Pain Intervention(s): Monitored during session  Home Living                                          Prior Functioning/Environment              Frequency  Min 2X/week        Progress Toward Goals  OT Goals(current goals can now be found in the care plan section)  Progress towards OT goals: Progressing toward goals  Acute Rehab OT Goals OT Goal Formulation: With patient/family Time For Goal Achievement: 03/12/21 Potential to Achieve Goals: Good ADL Goals Pt Will Perform Grooming: with modified independence;sitting Pt Will Perform Upper Body Bathing: with modified independence;sitting Pt Will Perform Lower Body Bathing: with modified independence;sit to/from stand Pt Will Perform Lower Body Dressing: with modified independence;sit to/from stand Pt Will Transfer to Toilet: with modified independence;ambulating Additional ADL Goal #1: pt will complete bed mobility supervision level as precursor to adls.  Plan Discharge plan remains appropriate    Co-evaluation                 AM-PAC OT "6 Clicks" Daily Activity     Outcome Measure   Help from another person eating meals?: A Little Help from another person taking care of personal grooming?: A Little Help from another person toileting, which includes using toliet, bedpan, or urinal?: A Little Help from another person bathing (including washing, rinsing, drying)?: A Little Help from another person to put on and taking off regular upper body clothing?: A Little Help from another person to put on and taking off regular lower body clothing?: A Little 6 Click Score: 18    End of Session Equipment Utilized During Treatment: Gait belt;Rolling walker (2 wheels);Oxygen  OT Visit Diagnosis: Unsteadiness on feet  (R26.81);Muscle weakness (generalized) (M62.81);Other symptoms and signs involving the nervous system (R29.898)   Activity Tolerance Patient tolerated treatment well   Patient Left in chair;with call bell/phone within reach;with chair alarm set   Nurse Communication Mobility status;Precautions        Time: 9604-5409 OT Time Calculation (min): 20 min  Charges: OT General Charges $OT Visit: 1 Visit OT Treatments $Self Care/Home Management : 8-22 mins   Breken Nazari A Adis Sturgill 03/06/2021, 4:37 PM

## 2021-03-06 NOTE — Care Management Important Message (Signed)
Important Message  Patient Details  Name: Michelle Dean MRN: [id number] Date of Birth: Aug 22, 1945   Medicare Important Message Given:  Yes     Dorena Bodo 03/06/2021, 2:03 PM

## 2021-03-06 NOTE — H&P (Signed)
Physical Medicine and Rehabilitation Admission H&P    Chief Complaint  Patient presents with   Code Stroke  : HPI: Michelle Dean is a 75 year old right-handed female with history of COPD with intermittent oxygen at home, hypertension and hyperlipidemia.  Per chart review lives alone.  Independent prior to admission.  1 level home one-step to entry.  Patient does have supportive family in the area.  Presented 02/25/2021 to the [facility] with expressive receptive aphasia.  CT of the head showed no acute intracranial hemorrhage or evidence of acute infarction.  Age-indeterminate small vessel infarcts of the left ganglial capsular region.  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  Patient was administered TNK.  CTA revealed a left M2/M3 occlusion patient was transferred to [facility] for four-vessel angiogram and underwent partial recanalization with reocclusion followed by mechanical thrombolysis achieving a TICI revascularization.  Hospital course 10/19 developed atrial fibrillation RVR started on Cardizem however RVR persisted with cardiology service follow-up underwent cardioversion 10/21 unfortunately back into A. fib and again with cardioversion 03/05/2021 sustaining NSR.  TEE  ejection fraction 50-55% no thrombus or vegetation.  Patient currently is maintained on Eliquis.  Close monitoring of blood pressure maintained on ProAmatine.  Patient did have some leukocytosis 23,600 improved to 14,000 and latest chest x-ray showed no signs of pneumonia she is currently completing a course of doxycycline.  Tolerating a regular consistency diet.  Therapy evaluations completed due to patient decreased functional ability and aphasia was admitted for a comprehensive rehab program.  Pt just walked 30-50 ft with Acute therapy- RW min A Needs max cues for safety, etc- is also HOH.    Review of Systems  Constitutional:  Negative for chills and fever.  HENT:  Negative for hearing loss.    Eyes:  Negative for blurred vision and double vision.  Respiratory:  Negative for cough and shortness of breath.   Cardiovascular:  Positive for palpitations and leg swelling. Negative for chest pain.  Gastrointestinal:  Positive for constipation. Negative for heartburn, nausea and vomiting.  Genitourinary:  Negative for dysuria, flank pain and hematuria.  Musculoskeletal:  Positive for joint pain and myalgias.  Skin:  Negative for rash.  Neurological:  Positive for speech change.  All other systems reviewed and are negative. History reviewed. No pertinent past medical history. Past Surgical History:  Procedure Laterality Date   BUBBLE STUDY  03/01/2021   Procedure: BUBBLE STUDY;  Surgeon: Tessa Lerner, DO;  Location: [facility] ENDOSCOPY;  Service: Cardiovascular;;   CARDIOVERSION N/A 03/01/2021   Procedure: CARDIOVERSION;  Surgeon: Tessa Lerner, DO;  Location: [facility] ENDOSCOPY;  Service: Cardiovascular;  Laterality: N/A;   CARDIOVERSION N/A 03/05/2021   Procedure: CARDIOVERSION;  Surgeon: Yates Decamp, MD;  Location[facility] ENDOSCOPY;  Service: Cardiovascular;  Laterality: N/A;   IR CT HEAD LTD  02/25/2021   IR PERCUTANEOUS ART THROMBECTOMY/INFUSION INTRACRANIAL INC DIAG ANGIO  02/25/2021   RADIOLOGY WITH ANESTHESIA N/A 02/25/2021   Procedure: IR WITH ANESTHESIA;  Surgeon: Radiologist, Medication, MD;  Location: [facility] OR;  Service: Radiology;  Laterality: N/A;   TEE WITHOUT CARDIOVERSION N/A 03/01/2021   Procedure: TRANSESOPHAGEAL ECHOCARDIOGRAM (TEE);  Surgeon: Tessa Lerner, DO;  Location: [facility] ENDOSCOPY;  Service: Cardiovascular;  Laterality: N/A;   History reviewed. No pertinent family history. Social History:  has no history on file for tobacco use, alcohol use, and drug use. Allergies:  Allergies  Allergen Reactions   Aspirin     Other reaction(s): Bleeding   Penicillins Hives and Rash  Lisinopril Cough   Medications Prior to Admission  Medication Sig Dispense Refill   albuterol (PROVENTIL)  (2.5 MG/3ML) 0.083% nebulizer solution Take 2.5 mg by nebulization every 4 (four) hours as needed for wheezing.     atorvastatin (LIPITOR) 10 MG tablet Take 10 mg by mouth daily.     carboxymethylcellulose (REFRESH PLUS) 0.5 % SOLN Place 1 drop into both eyes 4 (four) times daily as needed (dry eyes).     DULERA 100-5 MCG/ACT AERO Inhale 2 puffs into the lungs 2 (two) times daily.     losartan (COZAAR) 25 MG tablet Take 25 mg by mouth daily.     Multiple Vitamin (MULTIVITAMIN) tablet Take 1 tablet by mouth daily.     omeprazole (PRILOSEC) 20 MG capsule Take 20 mg by mouth daily.     tiotropium (SPIRIVA HANDIHALER) 18 MCG inhalation capsule Place 18 mcg into inhaler and inhale daily.      Drug Regimen Review Drug regimen was reviewed and remains appropriate with no significant issues identified  Home: Home Living Family/patient expects to be discharged to:: Private residence Living Arrangements: Alone Available Help at Discharge: Family, Available 24 hours/day Type of Home: House Home Access: Level entry Entrance Stairs-Number of Steps: 1 Entrance Stairs-Rails: Right Home Layout: One level Bathroom Shower/Tub: Associate Professor: Yes Home Equipment: None  Lives With: Alone   Functional History: Prior Function (Read Only) Level of Independence: Independent  Functional Status:  Mobility: Bed Mobility Overal bed mobility: Needs Assistance Bed Mobility: Supine to Sit Supine to sit: Supervision General bed mobility comments: in chair Transfers Overall transfer level: Needs assistance Equipment used: Rolling walker (2 wheels) Transfers: Sit to/from Stand Sit to Stand: Supervision General transfer comment: For safety, no physical assist needed, pt picking RW up off of floor during turns Ambulation/Gait Ambulation/Gait assistance: Min assist Gait Distance (Feet): 30 Feet (15 ft, rest break in bathroom) Assistive device: Rolling  walker (2 wheels) Gait Pattern/deviations: Step-through pattern, Decreased stride length, Drifts right/left General Gait Details: Pt responds better to visual cues for directions using RW, DOE 2/4 Gait velocity: decreased Gait velocity interpretation: <1.8 ft/sec, indicate of risk for recurrent falls    ADL: ADL Overall ADL's : Needs assistance/impaired Eating/Feeding: Set up Grooming: Set up, Standing Grooming Details (indicate cue type and reason): with mod cues to sequence and min guard balance. Upper Body Bathing: Min guard, Standing Lower Body Bathing: Minimal assistance, Sit to/from stand Upper Body Dressing : Min guard, Sitting Lower Body Dressing: Minimal assistance, Sitting/lateral leans Toilet Transfer: Supervision/safety, Rolling walker (2 wheels) Toilet Transfer Details (indicate cue type and reason): supervision for safety - pt with poor management of RW Toileting- Clothing Manipulation and Hygiene: Supervision/safety Toileting - Clothing Manipulation Details (indicate cue type and reason): for safety Functional mobility during ADLs: Min guard General ADL Comments: pt demonstrates increased ability to engage in adl task and with increased therapy coudl progress to higher level  Cognition: Cognition Overall Cognitive Status: Impaired/Different from baseline Arousal/Alertness: Awake/alert Orientation Level: Disoriented to place, Disoriented X4 Attention: Focused Focused Attention: Impaired Memory:  (very challenging to assess will require additional clinical interaction) Awareness: Impaired Problem Solving: Impaired Executive Function: Organizing, Self Monitoring Organizing: Impaired Safety/Judgment: Impaired Cognition Arousal/Alertness: Awake/alert Behavior During Therapy: WFL for tasks assessed/performed Overall Cognitive Status: Impaired/Different from baseline Area of Impairment: Safety/judgement, Awareness, Problem solving, Following commands Following  Commands: Follows one step commands inconsistently Safety/Judgement: Decreased awareness of safety, Decreased awareness of deficits Awareness: Emergent Problem Solving: Slow  processing, Decreased initiation, Difficulty sequencing (Does well with physical gestures) General Comments: continues to have receptive difficulties, unable to state her last name without hints. Improved management of RW but still limited awareness of environmetnal obstacles Difficult to assess due to: Impaired communication  Physical Exam: Blood pressure 110/76, pulse 62, temperature 98.2 F (36.8 C), temperature source Oral, resp. rate 20, height 5\' 7"  (1.702 m), weight 90.2 kg, SpO2 96 %. Physical Exam Vitals and nursing note reviewed.  Constitutional:      Appearance: She is obese.     Comments: Pt awake, alert, appears stated age; sitting in bedside chair; wearing 2L O2 by [place], NAD  HENT:     Head: Normocephalic and atraumatic.     Comments: Smile equal; tongue midline; facial sensation intact    Right Ear: External ear normal.     Left Ear: External ear normal.     Nose: Nose normal. No congestion.     Mouth/Throat:     Mouth: Mucous membranes are dry.     Pharynx: Oropharynx is clear. No oropharyngeal exudate.  Eyes:     General:        Right eye: No discharge.        Left eye: No discharge.     Extraocular Movements: Extraocular movements intact.  Cardiovascular:     Rate and Rhythm: Normal rate and regular rhythm.     Heart sounds: Normal heart sounds. No murmur heard.   No gallop.  Pulmonary:     Comments: Coughing intermittently- sounds a little tight; little coarse; but no W/R/R Abdominal:     Comments: Soft, NT, ND, (+)BS    Musculoskeletal:     Cervical back: Normal range of motion. No rigidity.     Comments: R side 5-/5 in Biceps, triceps, WE, grip and finger abd; as well as HF, KE, DF and PF L side 5/5 in same muscles  Skin:    Comments: Intact- warm dry and R wrist IV- looks OK   Neurological:     Mental Status: She is alert.     Comments: Patient is alert makes eye contact with examiner.  Aphasic receptive greater than expressive.  She did not follow commands during exam.  Global aphasia- but with repeated gestures and her seeing my mouth move, was able to figure out most of exam, but took multiple times.  Intact to light touch x 4 extremities per pt. Not sure if correct  Psychiatric:     Comments: Flat affect    Results for orders placed or performed during the hospital encounter of 02/25/21 (from the past 48 hour(s))  CBC     Status: Abnormal   Collection Time: 03/06/21 10:12 AM  Result Value Ref Range   WBC 12.1 (H) 4.0 - 10.5 K/uL   RBC 3.69 (L) 3.87 - 5.11 MIL/uL   Hemoglobin 11.1 (L) 12.0 - 15.0 g/dL   HCT 03/08/21 (L) 19.5 - 09.3 %   MCV 95.1 80.0 - 100.0 fL   MCH 30.1 26.0 - 34.0 pg   MCHC 31.6 30.0 - 36.0 g/dL   RDW 26.7 12.4 - 58.0 %   Platelets 307 150 - 400 K/uL   nRBC 0.0 0.0 - 0.2 %    Comment: Performed at [facility], [address], [place], [place]  Basic metabolic panel     Status: Abnormal   Collection Time: 03/06/21 10:12 AM  Result Value Ref Range   Sodium 139 135 - 145 mmol/L  Potassium 4.0 3.5 - 5.1 mmol/L   Chloride 95 (L) 98 - 111 mmol/L   CO2 33 (H) 22 - 32 mmol/L   Glucose, Bld 90 70 - 99 mg/dL    Comment: Glucose reference range applies only to samples taken after fasting for at least 8 hours.   BUN 21 8 - 23 mg/dL   Creatinine, Ser 1.47 (H) 0.44 - 1.00 mg/dL   Calcium 8.8 (L) 8.9 - 10.3 mg/dL   GFR, Estimated 48 (L) >60 mL/min    Comment: (NOTE) Calculated using the CKD-EPI Creatinine Equation (2021)    Anion gap 11 5 - 15    Comment: Performed at [facility], [address], [place], [place] [address]  Basic metabolic panel     Status: Abnormal   Collection Time: 03/07/21  2:50 AM  Result Value Ref Range   Sodium 138 135 - 145 mmol/L   Potassium 3.7 3.5 - 5.1 mmol/L   Chloride 96 (L) 98 -  111 mmol/L   CO2 34 (H) 22 - 32 mmol/L   Glucose, Bld 90 70 - 99 mg/dL    Comment: Glucose reference range applies only to samples taken after fasting for at least 8 hours.   BUN 24 (H) 8 - 23 mg/dL   Creatinine, Ser 2.13 (H) 0.44 - 1.00 mg/dL   Calcium 9.0 8.9 - 08.6 mg/dL   GFR, Estimated 49 (L) >60 mL/min    Comment: (NOTE) Calculated using the CKD-EPI Creatinine Equation (2021)    Anion gap 8 5 - 15    Comment: Performed at [facility], [address], [place], [place] [address]  CBC     Status: Abnormal   Collection Time: 03/07/21  2:50 AM  Result Value Ref Range   WBC 11.3 (H) 4.0 - 10.5 K/uL   RBC 3.69 (L) 3.87 - 5.11 MIL/uL   Hemoglobin 11.2 (L) 12.0 - 15.0 g/dL   HCT 96.2 (L) 95.2 - 84.1 %   MCV 94.3 80.0 - 100.0 fL   MCH 30.4 26.0 - 34.0 pg   MCHC 32.2 30.0 - 36.0 g/dL   RDW 32.4 40.1 - 02.7 %   Platelets 290 150 - 400 K/uL   nRBC 0.0 0.0 - 0.2 %    Comment: Performed at [facility], [address], [place], [place] [address]  Brain natriuretic peptide     Status: Abnormal   Collection Time: 03/07/21  2:50 AM  Result Value Ref Range   B Natriuretic Peptide 126.4 (H) 0.0 - 100.0 pg/mL    Comment: Performed at [facility], [address] [place], [place] [address]   No results found.     Medical Problem List and Plan: 1.   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  Antithrombotics: -DVT/anticoagulation:  Pharmaceutical: Other (comment) Eliquis  -antiplatelet therapy: N/A 3. Pain Management: Tylenol as needed 4. Mood: Provide emotional support  -antipsychotic agents: N/A 5. Neuropsych: This patient is not capable of making decisions on her own behalf. 6. Skin/Wound Care: Routine skin checks 7. Fluids/Electrolytes/Nutrition: Routine in and outs with follow-up chemistries 8.  Atrial  fibrillation with RVR.  Status post cardioversion.  Follow-up cardiology services.  Amiodarone 200 mg  daily, verapamil 40 mg every 8 hours. 9.  Hypotension.  Currently on ProAmatine 10 mg 3 times daily. 10.  Hyperlipidemia.  Lipitor 11.  History of COPD.  Monitor oxygen saturations.  Continue nebulizers 12.  Leukocytosis.  Chest x-ray negative.  Completed course of doxycycline   I have personally performed a face to face diagnostic evaluation of this patient and formulated the key components of the plan.  Additionally, I have personally reviewed laboratory data, imaging studies, as well as relevant notes and concur with the physician assistant's documentation above.   The patient's status has not changed from the original H&P.  Any changes in documentation from the acute care chart have been noted above.    Mcarthur Rossetti Angiulli, PA-C 03/07/2021

## 2021-03-07 ENCOUNTER — Inpatient Hospital Stay (HOSPITAL_COMMUNITY)
Admission: RE | Admit: 2021-03-07 | Discharge: 2021-03-19 | DRG: 057 | Disposition: A | Discharge status: Home Health | Payer: Medicare Other | Source: Intra-hospital | Attending: Physical Medicine & Rehabilitation | Admitting: Physical Medicine & Rehabilitation

## 2021-03-07 ENCOUNTER — Other Ambulatory Visit: Payer: Self-pay

## 2021-03-07 ENCOUNTER — Other Ambulatory Visit (HOSPITAL_COMMUNITY): Payer: Self-pay

## 2021-03-07 ENCOUNTER — Encounter (HOSPITAL_COMMUNITY): Payer: Self-pay | Admitting: Physical Medicine & Rehabilitation

## 2021-03-07 DIAGNOSIS — Z888 Allergy status to other drugs, medicaments and biological substances status: Secondary | ICD-10-CM | POA: Diagnosis not present

## 2021-03-07 DIAGNOSIS — I69390 Apraxia following cerebral infarction: Secondary | ICD-10-CM | POA: Diagnosis not present

## 2021-03-07 DIAGNOSIS — K219 Gastro-esophageal reflux disease without esophagitis: Secondary | ICD-10-CM | POA: Diagnosis present

## 2021-03-07 DIAGNOSIS — Z7901 Long term (current) use of anticoagulants: Secondary | ICD-10-CM | POA: Diagnosis not present

## 2021-03-07 DIAGNOSIS — Z79899 Other long term (current) drug therapy: Secondary | ICD-10-CM

## 2021-03-07 DIAGNOSIS — I9589 Other hypotension: Secondary | ICD-10-CM

## 2021-03-07 DIAGNOSIS — Z886 Allergy status to analgesic agent status: Secondary | ICD-10-CM | POA: Diagnosis not present

## 2021-03-07 DIAGNOSIS — K5900 Constipation, unspecified: Secondary | ICD-10-CM | POA: Diagnosis not present

## 2021-03-07 DIAGNOSIS — D72829 Elevated white blood cell count, unspecified: Secondary | ICD-10-CM | POA: Diagnosis present

## 2021-03-07 DIAGNOSIS — E785 Hyperlipidemia, unspecified: Secondary | ICD-10-CM | POA: Diagnosis present

## 2021-03-07 DIAGNOSIS — I10 Essential (primary) hypertension: Secondary | ICD-10-CM | POA: Diagnosis present

## 2021-03-07 DIAGNOSIS — I959 Hypotension, unspecified: Secondary | ICD-10-CM | POA: Diagnosis present

## 2021-03-07 DIAGNOSIS — Z880 Allergy status to penicillin: Secondary | ICD-10-CM | POA: Diagnosis not present

## 2021-03-07 DIAGNOSIS — Z9981 Dependence on supplemental oxygen: Secondary | ICD-10-CM

## 2021-03-07 DIAGNOSIS — Z7951 Long term (current) use of inhaled steroids: Secondary | ICD-10-CM

## 2021-03-07 DIAGNOSIS — I952 Hypotension due to drugs: Secondary | ICD-10-CM | POA: Diagnosis not present

## 2021-03-07 DIAGNOSIS — I480 Paroxysmal atrial fibrillation: Secondary | ICD-10-CM

## 2021-03-07 DIAGNOSIS — R4701 Aphasia: Secondary | ICD-10-CM | POA: Diagnosis not present

## 2021-03-07 DIAGNOSIS — I63512 Cerebral infarction due to unspecified occlusion or stenosis of left middle cerebral artery: Secondary | ICD-10-CM | POA: Diagnosis not present

## 2021-03-07 DIAGNOSIS — I69319 Unspecified symptoms and signs involving cognitive functions following cerebral infarction: Secondary | ICD-10-CM | POA: Diagnosis not present

## 2021-03-07 DIAGNOSIS — I4891 Unspecified atrial fibrillation: Secondary | ICD-10-CM | POA: Diagnosis present

## 2021-03-07 DIAGNOSIS — J449 Chronic obstructive pulmonary disease, unspecified: Secondary | ICD-10-CM | POA: Diagnosis present

## 2021-03-07 DIAGNOSIS — I6340 Cerebral infarction due to embolism of unspecified cerebral artery: Secondary | ICD-10-CM

## 2021-03-07 DIAGNOSIS — I69351 Hemiplegia and hemiparesis following cerebral infarction affecting right dominant side: Principal | ICD-10-CM

## 2021-03-07 DIAGNOSIS — I5031 Acute diastolic (congestive) heart failure: Secondary | ICD-10-CM

## 2021-03-07 DIAGNOSIS — I69320 Aphasia following cerebral infarction: Secondary | ICD-10-CM | POA: Diagnosis not present

## 2021-03-07 DIAGNOSIS — I639 Cerebral infarction, unspecified: Secondary | ICD-10-CM | POA: Diagnosis not present

## 2021-03-07 LAB — BASIC METABOLIC PANEL
Anion gap: 8 (ref 5–15)
BUN: 24 mg/dL — ABNORMAL HIGH (ref 8–23)
CO2: 34 mmol/L — ABNORMAL HIGH (ref 22–32)
Calcium: 9 mg/dL (ref 8.9–10.3)
Chloride: 96 mmol/L — ABNORMAL LOW (ref 98–111)
Creatinine, Ser: 1.15 mg/dL — ABNORMAL HIGH (ref 0.44–1.00)
GFR, Estimated: 49 mL/min — ABNORMAL LOW (ref >60)
Glucose, Bld: 90 mg/dL (ref 70–99)
Potassium: 3.7 mmol/L (ref 3.5–5.1)
Sodium: 138 mmol/L (ref 135–145)

## 2021-03-07 LAB — CBC
HCT: 34.8 % — ABNORMAL LOW (ref 36.0–46.0)
Hemoglobin: 11.2 g/dL — ABNORMAL LOW (ref 12.0–15.0)
MCH: 30.4 pg (ref 26.0–34.0)
MCHC: 32.2 g/dL (ref 30.0–36.0)
MCV: 94.3 fL (ref 80.0–100.0)
Platelets: 290 10*3/uL (ref 150–400)
RBC: 3.69 MIL/uL — ABNORMAL LOW (ref 3.87–5.11)
RDW: 13.4 % (ref 11.5–15.5)
WBC: 11.3 10*3/uL — ABNORMAL HIGH (ref 4.0–10.5)
nRBC: 0 % (ref 0.0–0.2)

## 2021-03-07 LAB — BRAIN NATRIURETIC PEPTIDE: B Natriuretic Peptide: 126.4 pg/mL — ABNORMAL HIGH (ref 0.0–100.0)

## 2021-03-07 MED ORDER — ALBUTEROL SULFATE (2.5 MG/3ML) 0.083% IN NEBU: 2.5000 mg | INHALATION_SOLUTION | RESPIRATORY_TRACT | Status: DC | PRN

## 2021-03-07 MED ORDER — MAGNESIUM OXIDE -MG SUPPLEMENT 400 (240 MG) MG PO TABS
400.0000 mg | ORAL_TABLET | Freq: Every day | ORAL | Status: DC
  Administered 2021-03-08 – 2021-03-19 (×12): 400 mg via ORAL
  Filled 2021-03-07 (×12): qty 1

## 2021-03-07 MED ORDER — MAGNESIUM OXIDE 400 MG PO TABS
400.0000 mg | ORAL_TABLET | Freq: Every day | ORAL | 0 refills | Status: DC
  Filled 2021-03-07: qty 30, 30d supply, fill #0

## 2021-03-07 MED ORDER — ACETAMINOPHEN 160 MG/5ML PO SOLN: 650.0000 mg | ORAL | Status: DC | PRN

## 2021-03-07 MED ORDER — EXERCISE FOR HEART AND HEALTH BOOK
Freq: Once | Status: AC
  Filled 2021-03-07: qty 1

## 2021-03-07 MED ORDER — AMIODARONE HCL 200 MG PO TABS
200.0000 mg | ORAL_TABLET | Freq: Every day | ORAL | 0 refills | Status: DC
  Filled 2021-03-07: qty 60, 60d supply, fill #0

## 2021-03-07 MED ORDER — CERTAVITE/ANTIOXIDANTS PO TABS
1.0000 | ORAL_TABLET | Freq: Every day | ORAL | 0 refills | Status: AC
  Filled 2021-03-07: qty 60, 60d supply, fill #0

## 2021-03-07 MED ORDER — VERAPAMIL HCL 80 MG PO TABS
40.0000 mg | ORAL_TABLET | Freq: Three times a day (TID) | ORAL | 0 refills | Status: DC
  Filled 2021-03-07: qty 135, 90d supply, fill #0

## 2021-03-07 MED ORDER — AMIODARONE HCL 200 MG PO TABS
200.0000 mg | ORAL_TABLET | Freq: Every day | ORAL | Status: DC
  Administered 2021-03-08 – 2021-03-19 (×12): 200 mg via ORAL
  Filled 2021-03-07 (×12): qty 1

## 2021-03-07 MED ORDER — ATORVASTATIN CALCIUM 10 MG PO TABS
10.0000 mg | ORAL_TABLET | Freq: Every day | ORAL | Status: DC
  Administered 2021-03-08 – 2021-03-19 (×12): 10 mg via ORAL
  Filled 2021-03-07 (×12): qty 1

## 2021-03-07 MED ORDER — SENNOSIDES-DOCUSATE SODIUM 8.6-50 MG PO TABS
1.0000 | ORAL_TABLET | Freq: Every evening | ORAL | 0 refills | Status: DC | PRN
  Filled 2021-03-07: qty 30, 30d supply, fill #0

## 2021-03-07 MED ORDER — APIXABAN 5 MG PO TABS
5.0000 mg | ORAL_TABLET | Freq: Two times a day (BID) | ORAL | Status: DC
  Administered 2021-03-07 – 2021-03-19 (×24): 5 mg via ORAL
  Filled 2021-03-07 (×25): qty 1

## 2021-03-07 MED ORDER — VERAPAMIL HCL 40 MG PO TABS
40.0000 mg | ORAL_TABLET | Freq: Three times a day (TID) | ORAL | Status: DC
  Administered 2021-03-07 – 2021-03-11 (×11): 40 mg via ORAL
  Filled 2021-03-07 (×12): qty 1

## 2021-03-07 MED ORDER — APIXABAN 5 MG PO TABS
5.0000 mg | ORAL_TABLET | Freq: Two times a day (BID) | ORAL | 0 refills | Status: DC
  Filled 2021-03-07: qty 60, 30d supply, fill #0

## 2021-03-07 MED ORDER — ACETAMINOPHEN 325 MG PO TABS
650.0000 mg | ORAL_TABLET | ORAL | 0 refills | Status: DC | PRN
  Filled 2021-03-07: qty 30, 3d supply, fill #0

## 2021-03-07 MED ORDER — ADULT MULTIVITAMIN W/MINERALS CH
1.0000 | ORAL_TABLET | Freq: Every day | ORAL | Status: DC
  Administered 2021-03-08 – 2021-03-19 (×12): 1 via ORAL
  Filled 2021-03-07 (×12): qty 1

## 2021-03-07 MED ORDER — SENNOSIDES-DOCUSATE SODIUM 8.6-50 MG PO TABS
1.0000 | ORAL_TABLET | Freq: Every evening | ORAL | Status: DC | PRN
  Administered 2021-03-16: 1 via ORAL
  Filled 2021-03-07: qty 1

## 2021-03-07 MED ORDER — ACETAMINOPHEN 325 MG PO TABS: 650.0000 mg | ORAL_TABLET | ORAL | Status: DC | PRN

## 2021-03-07 MED ORDER — LIVING BETTER WITH HEART FAILURE BOOK: Freq: Once | Status: AC

## 2021-03-07 MED ORDER — MIDODRINE HCL 5 MG PO TABS
5.0000 mg | ORAL_TABLET | Freq: Three times a day (TID) | ORAL | Status: DC
  Administered 2021-03-07 – 2021-03-12 (×15): 5 mg via ORAL
  Filled 2021-03-07 (×15): qty 1

## 2021-03-07 MED ORDER — ACETAMINOPHEN 650 MG RE SUPP: 650.0000 mg | RECTAL | Status: DC | PRN

## 2021-03-07 MED ORDER — DULERA 100-5 MCG/ACT IN AERO
2.0000 | INHALATION_SPRAY | Freq: Two times a day (BID) | RESPIRATORY_TRACT | 0 refills | Status: DC
  Filled 2021-03-07: qty 13, 30d supply, fill #0

## 2021-03-07 MED ORDER — MIDODRINE HCL 5 MG PO TABS
5.0000 mg | ORAL_TABLET | Freq: Three times a day (TID) | ORAL | Status: DC
  Administered 2021-03-07: 5 mg via ORAL
  Filled 2021-03-07: qty 1

## 2021-03-07 MED ORDER — AMIODARONE HCL 200 MG PO TABS
200.0000 mg | ORAL_TABLET | Freq: Every day | ORAL | Status: DC
  Administered 2021-03-07: 200 mg via ORAL
  Filled 2021-03-07: qty 1

## 2021-03-07 MED ORDER — MOMETASONE FURO-FORMOTEROL FUM 100-5 MCG/ACT IN AERO
2.0000 | INHALATION_SPRAY | Freq: Two times a day (BID) | RESPIRATORY_TRACT | Status: DC
  Administered 2021-03-07 – 2021-03-19 (×24): 2 via RESPIRATORY_TRACT
  Filled 2021-03-07: qty 8.8

## 2021-03-07 MED ORDER — UMECLIDINIUM BROMIDE 62.5 MCG/ACT IN AEPB
1.0000 | INHALATION_SPRAY | Freq: Every day | RESPIRATORY_TRACT | Status: DC
  Administered 2021-03-12 – 2021-03-19 (×8): 1 via RESPIRATORY_TRACT
  Filled 2021-03-07: qty 7

## 2021-03-07 NOTE — Progress Notes (Signed)
Courtney Heys, MD   Physician  Physical Medicine and Rehabilitation  PMR Pre-admission      Signed  Date of Service:  02/26/2021  2:16 PM       Related encounter: ED to Hosp-Admission (Discharged) from 02/25/2021 in [facility]       Signed      Show:Clear all _0 Written_1 Templated_2 Copied  Added by: _3 Cristina Gong, RN_4 Lind Covert, Lauren Mamie Nick, CCC-SLP_5 Courtney Heys, MD  _6 Hover for details                                                                                                                                                                                                                                                                                                                                                                                                                                  PMR Admission Coordinator Pre-Admission Assessment   Patient: Michelle Dean is an 75 y.o., female MRN: [id number] DOB: 12/31/1945 Height: _7  (170.2 cm) Weight: 90.2 kg   Insurance Information HMO:     PPO:      PCP:      IPA:      80/20:      OTHER:  PRIMARY: [employer] Medicare      Policy#: [id number]      Subscriber: patient CM Name:   [name]    Phone#:    [phone] option 7  Fax#: [phone] Pre-Cert#: [id number] approved for 7  days      Employer:  Benefits:  Phone #: [phone]     Name: 10/17 Eff. Date: 05/12/2020     Deduct: none      Out of Pocket Max: $3600      Life Max: none CIR: $295 co pay per day days 1 until 5      SNF: no copay days 1 until 20; $188 co pay per day days 21 until 40; no copay days 41 until; 100 Outpatient: $30 per visit     Co-Pay: visits per medical neccesity Home Health: 100%      Co-Pay: visits per medical neccesity DME: 80%     Co-Pay: 20% Providers: in-network  SECONDARY:        Policy#:      Phone#:    Development worker, community:       Phone#:    The Engineer, petroleum" for patients in Inpatient Rehabilitation Facilities with attached "Privacy Act [place] Records" was provided and verbally reviewed with: Family   Emergency Contact Information Contact Information       Name Relation Home Work Mobile    [place] Son [phone]    [name]     [phone]           Current Medical History  Patient Admitting Diagnosis: Left MCA stroke   History of Present Illness: 75 year old right-handed female with history of COPD with intermittent oxygen at home, hypertension and hyperlipidemia.   Presented 02/25/2021 to the [facility] with expressive receptive aphasia.  CT of the head showed no acute intracranial hemorrhage or evidence of acute infarction.  Age-indeterminate small vessel infarcts of the left ganglial capsular region.  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  Patient was administered TNK.  CTA revealed a left M2/M3 occlusion patient was transferred to [facility] for four-vessel angiogram and underwent partial recanalization with reocclusion followed by mechanical thrombolysis achieving a TICI revascularization.  Hospital course 10/19 developed atrial fibrillation RVR started on Cardizem however RVR persisted with cardiology service follow-up underwent cardioversion 10/21 unfortunately back into A. fib and again with cardioversion 03/05/2021 sustaining NSR.  TEE  ejection fraction 50-55% no thrombus or vegetation.  Patient currently is maintained on Eliquis.  Close monitoring of blood pressure maintained on ProAmatine.  Patient did have some leukocytosis 23,600 improved to 14,000 and latest chest x-ray showed no signs of pneumonia she is currently completing a course of doxycycline.  Tolerating a regular consistency diet . Complete NIHSS TOTAL: 4   Patient's medical record from  [facility] has been reviewed by the rehabilitation admission coordinator and physician.   Past Medical History  History reviewed. No pertinent past medical history.   Has the patient had major surgery during 100 days prior to admission? Yes   Family History   family history is not on file.   Current Medications   Current Facility-Administered Medications:    0.9 %  sodium chloride infusion, 250 mL, Intravenous, Continuous, Adrian Prows, MD, Stopped at 03/02/21 1000   acetaminophen (TYLENOL) tablet 650 mg, 650 mg, Oral, Q4H PRN, 650 mg at 03/01/21 2341 **OR** acetaminophen (TYLENOL) 160 MG/5ML solution 650 mg, 650 mg, Per Tube, Q4H PRN **OR** acetaminophen (TYLENOL) suppository 650 mg, 650 mg, Rectal, Q4H PRN, Adrian Prows, MD   albuterol (PROVENTIL) (2.5 MG/3ML) 0.083% nebulizer solution 2.5 mg, 2.5 mg, Nebulization, Q4H PRN, Adrian Prows, MD, 2.5 mg at 03/05/21 2230   amiodarone (PACERONE) tablet 200 mg, 200 mg, Oral, Daily,  Tolia, Sunit, DO, 200 mg at 03/07/21 1259   apixaban (ELIQUIS) tablet 5 mg, 5 mg, Oral, BID, Adrian Prows, MD, 5 mg at 03/07/21 1121   atorvastatin (LIPITOR) tablet 10 mg, 10 mg, Oral, Daily, Adrian Prows, MD, 10 mg at 03/07/21 1122   Chlorhexidine Gluconate Cloth 2 % PADS 6 each, 6 each, Topical, Daily, Adrian Prows, MD, 6 each at 03/06/21 0857   labetalol (NORMODYNE) injection 10 mg, 10 mg, Intravenous, Q10 min PRN, 10 mg at 02/25/21 1616 **AND** [DISCONTINUED] clevidipine (CLEVIPREX) infusion 0.5 mg/mL, 0-21 mg/hr, Intravenous, Continuous, Bhagat, Srishti L, MD, Stopped at 02/26/21 0944   magnesium oxide (MAG-OX) tablet 400 mg, 400 mg, Oral, Daily, Adrian Prows, MD, 400 mg at 03/07/21 1121   midodrine (PROAMATINE) tablet 5 mg, 5 mg, Oral, TID WC, Tolia, Sunit, DO, 5 mg at 03/07/21 1259   mometasone-formoterol (DULERA) 100-5 MCG/ACT inhaler 2 puff, 2 puff, Inhalation, BID, Adrian Prows, MD, 2 puff at 03/06/21 1951   multivitamin with minerals tablet 1 tablet, 1 tablet, Oral,  Daily, Adrian Prows, MD, 1 tablet at 03/07/21 1121   senna-docusate (Senokot-S) tablet 1 tablet, 1 tablet, Oral, QHS PRN, Adrian Prows, MD   umeclidinium bromide (INCRUSE ELLIPTA) 62.5 MCG/INH 1 puff, 1 puff, Inhalation, Daily, Adrian Prows, MD, 1 puff at 03/04/21 0808   verapamil (CALAN) tablet 40 mg, 40 mg, Oral, Q8H, Adrian Prows, MD, 40 mg at 03/07/21 1300   Patients Current Diet:  Diet Order                  Diet Heart Room service appropriate? Yes; Fluid consistency: Thin  Diet effective now                       Precautions / Restrictions Precautions Precautions: Fall Precaution Comments: receptive aphasia, HoH Restrictions Weight Bearing Restrictions: No    Has the patient had 2 or more falls or a fall with injury in the past year? No   Prior Activity Level Limited Community (1-2x/wk): went places ~1-2 days/week   Prior Functional Level Self Care: Did the patient need help bathing, dressing, using the toilet or eating? Independent   Indoor Mobility: Did the patient need assistance with walking from room to room (with or without device)? Independent   Stairs: Did the patient need assistance with internal or external stairs (with or without device)? Independent   Functional Cognition: Did the patient need help planning regular tasks such as shopping or remembering to take medications? Independent   Patient Information   Patient's Response To:    Home Assistive Devices / Equipment Home Equipment: None   Prior Device Use: Indicate devices/aids used by the patient prior to current illness, exacerbation or injury? None of the above   Current Functional Level Cognition   Arousal/Alertness: Awake/alert Overall Cognitive Status: Impaired/Different from baseline Difficult to assess due to: Impaired communication Orientation Level: Disoriented to place, Disoriented X4 Following Commands: Follows one step commands inconsistently Safety/Judgement: Decreased awareness of  safety, Decreased awareness of deficits General Comments: continues to have receptive difficulties, unable to state her last name without hints. Improved management of RW but still limited awareness of environmetnal obstacles Attention: Focused Focused Attention: Impaired Memory:  (very challenging to assess will require additional clinical interaction) Awareness: Impaired Problem Solving: Impaired Executive Function: Organizing, Self Monitoring Organizing: Impaired Safety/Judgment: Impaired    Extremity Assessment (includes Sensation/Coordination)   Upper Extremity Assessment: Overall WFL for tasks assessed  Lower Extremity Assessment: Defer to PT  evaluation     ADLs   Overall ADL's : Needs assistance/impaired Eating/Feeding: Set up Grooming: Set up, Standing Grooming Details (indicate cue type and reason): with mod cues to sequence and min guard balance. Upper Body Bathing: Min guard, Standing Lower Body Bathing: Minimal assistance, Sit to/from stand Upper Body Dressing : Min guard, Sitting Lower Body Dressing: Minimal assistance, Sitting/lateral leans Toilet Transfer: Supervision/safety, Rolling walker (2 wheels) Toilet Transfer Details (indicate cue type and reason): supervision for safety - pt with poor management of RW Toileting- Clothing Manipulation and Hygiene: Supervision/safety Toileting - Clothing Manipulation Details (indicate cue type and reason): for safety Functional mobility during ADLs: Min guard General ADL Comments: pt demonstrates increased ability to engage in adl task and with increased therapy coudl progress to higher level     Mobility   Overal bed mobility: Needs Assistance Bed Mobility: Supine to Sit Supine to sit: Supervision General bed mobility comments: in chair     Transfers   Overall transfer level: Needs assistance Equipment used: Rolling walker (2 wheels) Transfers: Sit to/from Stand Sit to Stand: Supervision General transfer comment: For  safety, no physical assist needed, pt picking RW up off of floor during turns     Ambulation / Gait / Stairs / Wheelchair Mobility   Ambulation/Gait Ambulation/Gait assistance: Herbalist (Feet): 30 Feet (15 ft, rest break in bathroom) Assistive device: Rolling walker (2 wheels) Gait Pattern/deviations: Step-through pattern, Decreased stride length, Drifts right/left General Gait Details: Pt responds better to visual cues for directions using RW, DOE 2/4 Gait velocity: decreased Gait velocity interpretation: <1.8 ft/sec, indicate of risk for recurrent falls     Posture / Balance Dynamic Sitting Balance Sitting balance - Comments: able to sit EOB without assist Balance Overall balance assessment: Needs assistance Sitting-balance support: No upper extremity supported, Feet supported Sitting balance-Leahy Scale: Fair Sitting balance - Comments: able to sit EOB without assist Standing balance support: No upper extremity supported, During functional activity Standing balance-Leahy Scale: Fair Standing balance comment: needs at least U UE support for dynamic tasks     Special needs/care consideration      Previous Home Environment  Living Arrangements: Alone  Lives With: Alone Available Help at Discharge: Family, Available 24 hours/day Type of Home: House Home Layout: One level Home Access: Level entry Entrance Stairs-Rails: Right Entrance Stairs-Number of Steps: 1 Bathroom Shower/Tub: Chiropodist: Standard Bathroom Accessibility: Yes How Accessible: Accessible via walker Latta: No   Discharge Living Setting Plans for Discharge Living Setting: Patient's home Type of Home at Discharge: House Discharge Home Layout: One level Discharge Home Access: Level entry Discharge Bathroom Shower/Tub: [place] unit Discharge Bathroom Toilet: Standard Discharge Bathroom Accessibility: Yes How Accessible: Accessible via walker Does the  patient have any problems obtaining your medications?: No   Social/Family/Support Systems Anticipated Caregiver: Michelle Dean and other family Anticipated Caregiver's Contact Information: [phone] Caregiver Availability: 24/7 Discharge Plan Discussed with Primary Caregiver: Yes Is Caregiver In Agreement with Plan?: Yes Does Caregiver/Family have Issues with Lodging/Transportation while Pt is in Rehab?: No   Goals Goals to improve gait and work on speaking and swallowing- mod I to supervision for PT/OT and min A for SLP   Decrease burden of Care through IP rehab admission: NA   Possible need for SNF placement upon discharge: Not anticipated   Patient Condition: I have reviewed medical records from [facility] , spoken with CM, and patient and son. I met with patient at the bedside  for inpatient rehabilitation assessment.  Patient will benefit from ongoing PT, OT, and SLP, can actively participate in 3 hours of therapy a day 5 days of the week, and can make measurable gains during the admission.  Patient will also benefit from the coordinated team approach during an Inpatient Acute Rehabilitation admission.  The patient will receive intensive therapy as well as Rehabilitation physician, nursing, social worker, and care management interventions.  Due to bladder management, bowel management, safety, skin/wound care, disease management, medication administration, pain management, and patient education the patient requires 24 hour a day rehabilitation nursing.  The patient is currently min to mod assist overall with mobility and basic ADLs.  Discharge setting and therapy post discharge at home with home health is anticipated.  Patient has agreed to participate in the Acute Inpatient Rehabilitation Program and will admit today.   Preadmission Screen Completed By:  Cleatrice Burke, 03/07/2021 1:34 PM ______________________________________________________________________    Discussed status with Dr. Dagoberto Ligas on 03/07/2021 at 1443 and received approval for admission today.   Admission Coordinator:  Cleatrice Burke, RN, time  3754 Date 03/07/2021    Assessment/Plan: Diagnosis: L MCA stroke and aphasia and mild R hemi Does the need for close, 24 hr/day Medical supervision in concert with the patient's rehab needs make it unreasonable for this patient to be served in a less intensive setting? Yes Co-Morbidities requiring supervision/potential complications: COPD on Home O2; L MCA infarct, aphasia, HOH Due to bladder management, bowel management, safety, skin/wound care, disease management, medication administration, pain management, and patient education, does the patient require 24 hr/day rehab nursing? Yes Does the patient require coordinated care of a physician, rehab nurse, PT, OT, and SLP to address physical and functional deficits in the context of the above medical diagnosis(es)? Yes Addressing deficits in the following areas: balance, endurance, locomotion, strength, transferring, bathing, dressing, feeding, grooming, toileting, cognition, speech, language, and swallowing Can the patient actively participate in an intensive therapy program of at least 3 hrs of therapy 5 days a week? Yes The potential for patient to make measurable gains while on inpatient rehab is good Anticipated functional outcomes upon discharge from inpatient rehab: modified independent and supervision PT, modified independent and supervision OT, supervision and min assist SLP Estimated rehab length of stay to reach the above functional goals is: 14-16 days Anticipated discharge destination: Home 10. Overall Rehab/Functional Prognosis: good     MD Signature:           Revision History                               Note Details  Jan Fireman, MD File Time 03/07/2021  2:53 PM  Author Type Physician Status Signed  Last Editor Courtney Heys, MD Service Physical  Medicine and [place] # [id number] Admit Date 03/07/2021

## 2021-03-07 NOTE — Plan of Care (Signed)
  Problem: Education: Goal: Knowledge of General Education information will improve Description: Including pain rating scale, medication(s)/side effects and non-pharmacologic comfort measures Outcome: Completed/Met   Problem: Health Behavior/Discharge Planning: Goal: Ability to manage health-related needs will improve Outcome: Completed/Met   Problem: Clinical Measurements: Goal: Ability to maintain clinical measurements within normal limits will improve Outcome: Completed/Met Goal: Will remain free from infection Outcome: Completed/Met Goal: Diagnostic test results will improve Outcome: Completed/Met Goal: Respiratory complications will improve Outcome: Completed/Met Goal: Cardiovascular complication will be avoided Outcome: Completed/Met   Problem: Activity: Goal: Risk for activity intolerance will decrease Outcome: Completed/Met   Problem: Nutrition: Goal: Adequate nutrition will be maintained Outcome: Completed/Met   Problem: Coping: Goal: Level of anxiety will decrease Outcome: Completed/Met   Problem: Elimination: Goal: Will not experience complications related to bowel motility Outcome: Completed/Met Goal: Will not experience complications related to urinary retention Outcome: Completed/Met   Problem: Pain Managment: Goal: General experience of comfort will improve Outcome: Completed/Met   Problem: Safety: Goal: Ability to remain free from injury will improve Outcome: Completed/Met   Problem: Skin Integrity: Goal: Risk for impaired skin integrity will decrease Outcome: Completed/Met   Problem: Education: Goal: Knowledge of disease or condition will improve Outcome: Completed/Met Goal: Knowledge of secondary prevention will improve Outcome: Completed/Met Goal: Knowledge of patient specific risk factors addressed and post discharge goals established will improve Outcome: Completed/Met Goal: Individualized Educational Video(s) Outcome: Completed/Met    Problem: Coping: Goal: Will verbalize positive feelings about self Outcome: Completed/Met Goal: Will identify appropriate support needs Outcome: Completed/Met   Problem: Health Behavior/Discharge Planning: Goal: Ability to manage health-related needs will improve Outcome: Completed/Met   Problem: Self-Care: Goal: Ability to participate in self-care as condition permits will improve Outcome: Completed/Met Goal: Verbalization of feelings and concerns over difficulty with self-care will improve Outcome: Completed/Met Goal: Ability to communicate needs accurately will improve Outcome: Completed/Met   Problem: Nutrition: Goal: Risk of aspiration will decrease Outcome: Completed/Met Goal: Dietary intake will improve Outcome: Completed/Met   Problem: Ischemic Stroke/TIA Tissue Perfusion: Goal: Complications of ischemic stroke/TIA will be minimized Outcome: Completed/Met   Problem: SLP Dysphagia Goals Goal: Patient will utilize recommended strategies Description: Patient will utilize recommended strategies during swallow to increase swallowing safety with Outcome: Completed/Met   Problem: SLP Language Goals Goal: Patient will communicate needs/wants with Outcome: Completed/Met Goal: Patient will follow commands during a functional ADL with Outcome: Completed/Met   Problem: Acute Rehab PT Goals(only PT should resolve) Goal: Pt Will Go Supine/Side To Sit Outcome: Completed/Met Goal: Patient Will Transfer Sit To/From Stand Outcome: Completed/Met Goal: Pt Will Transfer Bed To Chair/Chair To Bed Outcome: Completed/Met Goal: Pt Will Ambulate Outcome: Completed/Met Goal: Pt Will Go Up/Down Stairs Outcome: Completed/Met Goal: Pt/caregiver will Perform Home Exercise Program Outcome: Completed/Met   Problem: Acute Rehab OT Goals (only OT should resolve) Goal: Pt. Will Perform Grooming Outcome: Completed/Met Goal: Pt. Will Perform Upper Body Bathing Outcome:  Completed/Met Goal: Pt. Will Transfer To Toilet Outcome: Completed/Met Goal: OT Additional ADL Goal #1 Outcome: Completed/Met   Problem: Acute Rehab OT Goals (only OT should resolve) Goal: Pt. Will Perform Lower Body Bathing Outcome: Completed/Met Goal: Pt. Will Perform Lower Body Dressing Outcome: Completed/Met Goal: Pt. Will Transfer To Toilet Outcome: Completed/Met

## 2021-03-07 NOTE — Progress Notes (Addendum)
Inpatient Rehabilitation Admissions Coordinator   Dr Roda Shutters notified of request per [facility] for peer to peer today by 12 noon.  Ottie Glazier, RN, MSN Rehab Admissions Coordinator [phone] 03/07/2021 8:56 AM  Insurance has approved inpt rehab admit and I await clarification if bed available today vs Friday. I spoke with pt's son by phone and he is aware and is also in agreement to admit.  Ottie Glazier, RN, MSN Rehab Admissions Coordinator [phone] 03/07/2021 12:20 PM

## 2021-03-07 NOTE — Evaluation (Signed)
Occupational Therapy Assessment and Plan  Patient Details  Name: Michelle Dean MRN: [id number] Date of Birth: 01/04/1946  OT Diagnosis: acute pain, cognitive deficits, muscle weakness (generalized), and swelling of limb Rehab Potential: Rehab Potential (ACUTE ONLY): Good ELOS: 10-12 days   Today's Date: 03/08/2021 OT Individual Time: 1300-1400 OT Individual Time Calculation (min): 60 min     Hospital Problem: Principal Problem:   Left middle cerebral artery stroke [facility])   Past Medical History: History reviewed. No pertinent past medical history. Past Surgical History:  Past Surgical History:  Procedure Laterality Date   BUBBLE STUDY  03/01/2021   Procedure: BUBBLE STUDY;  Surgeon: Rex Kras, DO;  Location: [place];  Service: Cardiovascular;;   CARDIOVERSION N/A 03/01/2021   Procedure: CARDIOVERSION;  Surgeon: Rex Kras, DO;  Location: [facility];  Service: Cardiovascular;  Laterality: N/A;   CARDIOVERSION N/A 03/05/2021   Procedure: CARDIOVERSION;  Surgeon: Adrian Prows, MD;  Location: [place];  Service: Cardiovascular;  Laterality: N/A;   IR CT HEAD LTD  02/25/2021   IR PERCUTANEOUS ART THROMBECTOMY/INFUSION INTRACRANIAL INC DIAG ANGIO  02/25/2021   RADIOLOGY WITH ANESTHESIA N/A 02/25/2021   Procedure: IR WITH ANESTHESIA;  Surgeon: Radiologist, Medication, MD;  Location: [place];  Service: Radiology;  Laterality: N/A;   TEE WITHOUT CARDIOVERSION N/A 03/01/2021   Procedure: TRANSESOPHAGEAL ECHOCARDIOGRAM (TEE);  Surgeon: Rex Kras, DO;  Location: [facility];  Service: Cardiovascular;  Laterality: N/A;    Assessment & Plan Clinical Impression: [name] is a 75 year old right-handed female with history of COPD with intermittent oxygen at home, hypertension and hyperlipidemia.  Per chart review lives alone.  Independent prior to admission.  1 level home one-step to entry.  Patient does have supportive family in the area.  Presented 02/25/2021 to the [facility] with  expressive receptive aphasia.  CT of the head showed no acute intracranial hemorrhage or evidence of acute infarction.  Age-indeterminate small vessel infarcts of the left ganglial capsular region.  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  Patient was administered TNK.  CTA revealed a left M2/M3 occlusion patient was transferred to [facility] for four-vessel angiogram and underwent partial recanalization with reocclusion followed by mechanical thrombolysis achieving a TICI revascularization.  Hospital course 10/19 developed atrial fibrillation RVR started on Cardizem however RVR persisted with cardiology service follow-up underwent cardioversion 10/21 unfortunately back into A. fib and again with cardioversion 03/05/2021 sustaining NSR.  TEE  ejection fraction 50-55% no thrombus or vegetation.  Patient currently is maintained on Eliquis.  Close monitoring of blood pressure maintained on ProAmatine.  Patient did have some leukocytosis 23,600 improved to 14,000 and latest chest x-ray showed no signs of pneumonia she is currently completing a course of doxycycline.  Tolerating a regular consistency diet.  Therapy evaluations completed due to patient decreased functional ability and aphasia was admitted for a comprehensive rehab program    Patient currently requires min with basic self-care skills secondary to muscle weakness, decreased cardiorespiratoy endurance and decreased oxygen support, decreased coordination, decreased awareness and decreased safety awareness, and decreased standing balance, decreased postural control, and decreased balance strategies.  Prior to hospitalization, patient could complete BADLs with independent .  Patient will benefit from skilled intervention to increase independence with basic self-care skills prior to discharge  home alone with son providing 24/7 supervision .  Anticipate patient will require 24 hour supervision and follow up home health.  OT - End  of Session Endurance Deficit: Yes OT Assessment Rehab Potential (ACUTE ONLY): Good OT  Barriers to Discharge: Lack of/limited family support OT Barriers to Discharge Comments: pts son reported that he can provide supervision for a few weeks at most OT Patient demonstrates impairments in the following area(s): Balance;Cognition;Edema;Endurance;Motor;Pain;Safety OT Basic ADL's Functional Problem(s): Grooming;Bathing;Dressing;Toileting OT Advanced ADL's Functional Problem(s): Simple Meal Preparation OT Transfers Functional Problem(s): Toilet;Tub/Shower OT Additional Impairment(s): None OT Plan OT Intensity: Minimum of 1-2 x/day, 45 to 90 minutes OT Frequency: 5 out of 7 days OT Duration/Estimated Length of Stay: 10-12 days OT Treatment/Interventions: DME/adaptive equipment instruction;Patient/family education;Therapeutic Activities;Therapeutic Exercise;Psychosocial support;Cognitive remediation/compensation;Balance/vestibular training;Wheelchair propulsion/positioning;Community reintegration;Functional mobility training;Self Care/advanced ADL retraining;UE/LE Strength taining/ROM;UE/LE Coordination activities;Neuromuscular re-education;Discharge planning;Disease mangement/prevention;Pain management OT Self Feeding Anticipated Outcome(s): No goal OT Basic Self-Care Anticipated Outcome(s): Supervision OT Toileting Anticipated Outcome(s): Supervision OT Bathroom Transfers Anticipated Outcome(s): Supervision OT Recommendation Recommendations for Other Services: Therapeutic Recreation consult Therapeutic Recreation Interventions: Pet therapy Patient destination: Home Follow Up Recommendations: Home health OT;24 hour supervision/assistance Equipment Recommended: To be determined   OT Evaluation Precautions/Restrictions  Precautions Precautions: Fall Precaution Comments: receptive>expressive aphasia, supplemental 02  Vital Signs Therapy Vitals Temp: 98 F (36.7 C) Pulse Rate: 62 Resp:  18 BP: 138/72 Patient Position (if appropriate): Sitting Oxygen Therapy SpO2: 98 % O2 Device: Nasal Cannula O2 Flow Rate (L/min): 2 L/min Pain Pain Assessment Pain Scale: Faces Faces Pain Scale: No hurt Home Living/Prior Functioning Home Living Living Arrangements: Alone Available Help at Discharge: Family, Available 24 hours/day (son Marcelina Morel reports that he is providing full supervision) Type of Home: Mudlogger of Steps: 1 Home Layout: One level Bathroom Shower/Tub: Optometrist: Yes  Lives With: Alone IADL History Homemaking Responsibilities: Yes (per son, pt fully independent PTA, using the push mower in the lawn, walking the dog, driving, and cooking/cleaning) Education: high school Leisure and Hobbies: walking her dog Prior Function Level of Independence: Independent with homemaking with ambulation, Independent with basic ADLs Driving: Yes Vocation: Retired Surveyor, mining Baseline Vision/History: 1 Wears glasses Ability to See in Adequate Light: 1 Impaired Vision Assessment?: No apparent visual deficits Perception  Perception: Within Functional Limits Praxis Praxis: Intact Cognition Overall Cognitive Status: Impaired/Different from baseline Arousal/Alertness: Awake/alert Orientation Level: Person (unable to assess orientation due to pts global aphasia receptive>expressive) Year: Other (Comment) (incorrect with 4-choice, correct with 2-choice) Memory:  (did not assess as patient's receptive aphasia is severe) Immediate Memory Recall:  (unable to assess due to aphasia) Attention: Focused Focused Attention: Impaired Focused Attention Impairment: Verbal basic;Functional basic Awareness: Impaired Awareness Impairment: Intellectual impairment Problem Solving: Appears intact (pants were right side out and pt able to correct without cues, also able to correctly orient faucet when sink water began collecting on  sink) Problem Solving Impairment: Functional basic Executive Function: Self Monitoring Self Monitoring: Impaired Self Monitoring Impairment: Verbal basic;Functional basic Safety/Judgment: Impaired Comments: Does not demonstrate awareness of deficits Sensation Coordination Gross Motor Movements are Fluid and Coordinated: No Fine Motor Movements are Fluid and Coordinated: Yes Coordination and Movement Description: Mild balance deficits with delayed balance reactions during functional transfers Finger Nose Finger Test: Unable to perform due to receptive deficits, pt stating "I don't understand" when given multimodal cues regarding how to complete Motor  Motor Motor: Other (comment) Motor - Skilled Clinical Observations: generalized debility from hospitalization, deficits with standing balance  Trunk/Postural Assessment  Cervical Assessment Cervical Assessment: Exceptions to [facility] (forward head) Thoracic Assessment Thoracic Assessment: Exceptions to [facility] (rounded shoulders) Lumbar Assessment Lumbar Assessment: Exceptions to [facility] (posterior pelvic tilt) Postural Control Postural Control: Deficits on evaluation (  decreased in standing)  Balance Balance Balance Assessed: Yes Dynamic Sitting Balance Dynamic Sitting - Balance Support: During functional activity Dynamic Sitting - Level of Assistance: 5: Stand by assistance (doffing/donning gripper socks) Dynamic Sitting - Balance Activities: Lateral lean/weight shifting;Forward lean/weight shifting Dynamic Standing Balance Dynamic Standing - Balance Support: No upper extremity supported;During functional activity Dynamic Standing - Level of Assistance: 4: Min assist Dynamic Standing - Balance Activities: Lateral lean/weight shifting;Forward lean/weight shifting (completing walk-in shower transfer without AD) Extremity/Trunk Assessment RUE Assessment RUE Assessment: Within Functional Limits LUE Assessment LUE Assessment: Within Functional  Limits  Care Tool Care Tool Self Care Eating    Not assessed    Oral Care    Oral Care Assist Level: Contact Guard/Toucning assist (standing at the sink)    Bathing   Body parts bathed by patient: Right arm;Left arm;Chest;Abdomen;Front perineal area;Right upper leg;Buttocks;Left upper leg;Right lower leg;Face;Left lower leg     Assist Level: Contact Guard/Touching assist    Upper Body Dressing(including orthotics)   What is the patient wearing?: Pull over shirt (for 02 mgt)   Assist Level: Supervision/Verbal cueing    Lower Body Dressing (excluding footwear)   What is the patient wearing?: Underwear/pull up;Pants Assist for lower body dressing: Contact Guard/Touching assist    Putting on/Taking off footwear   What is the patient wearing?: Non-skid slipper socks Assist for footwear: Supervision/Verbal cueing       Care Tool Toileting Toileting activity   Assist for toileting: Contact Guard/Touching assist        Toilet transfer   Assist Level: Minimal Assistance - Patient > 75%     Care Tool Cognition  Expression of Ideas and Wants Expression of Ideas and Wants: 2. Frequent difficulty - frequently exhibits difficulty with expressing needs and ideas  Understanding Verbal and Non-Verbal Content Understanding Verbal and Non-Verbal Content: 1. Rarely/never understands   Memory/Recall Ability Memory/Recall Ability : None of the above were recalled   Refer to Care Plan for Long Term Goals  SHORT TERM GOAL WEEK 1 OT Short Term Goal 1 (Week 1): Pt will complete ambulatory toilet transfer with CGA OT Short Term Goal 2 (Week 1): Pt will complete grooming tasks while standing at the sink with supervision for balance OT Short Term Goal 3 (Week 1): Pt will complete shower transfer with no more than CGA  Recommendations for other services: None    Skilled Therapeutic Intervention Skilled OT session completed with focus on initial evaluation, education on OT role/POC, and  establishment of patient-centered goals.   Pt greeted in the recliner with son Marcelina Morel present. Very prominent receptive aphasia noted, pt requiring heavy visual cuing to engage in ADL tasks at shower level. CGA-Min balance assistance during ambulatory bathroom transfers. Pt able to motor plan given min cuing and also exhibited good abilities to complete basic problem solving (I.e. correcting inside out pants and adjusting faucet lever when water began collecting on the sink counter). She did need cues for 02 mgt and basic safety (I.e. pt placing her Rt foot on top of the shower threshold and standing with the other foot planted on the floor). Pt required manual assist to obtain neutral base of support to minimize fall risk. Note that she grew tired very quickly and required multiple seated rest breaks, refused to engage in dressing tasks due to feeling too warm but did so with OT encouragement. She doffed clothing and donned a gown before OT departure to maximize comfort. Tried to don Ted stockings however pt reported  that they were too painful, noted hard edema in B LEs. She would benefit from ACE wrapping. After completing oral care in standing, pt returned to her recliner. All needs within reach and safety belt fastened.   02 sats on 2L throughout tx 96-98%.   ADL ADL Eating: Not assessed Grooming: Contact guard Where Assessed-Grooming: Standing at sink Upper Body Bathing: Supervision/safety Where Assessed-Upper Body Bathing: Shower Lower Body Bathing: Contact guard Where Assessed-Lower Body Bathing: Shower Upper Body Dressing: Supervision/safety Where Assessed-Upper Body Dressing: Other (Comment) (sitting on toilet) Lower Body Dressing: Contact guard Where Assessed-Lower Body Dressing: Other (Comment) (sit<stand from toilet) Toileting: Contact guard Where Assessed-Toileting: Glass blower/designer: Minimal assistance (ambulation without AD) Toilet Transfer Method: Information systems manager: Raised toilet seat;Grab bars Social research officer, government: Minimal assistance Social research officer, government Method: Heritage manager: Radio broadcast assistant  Discharge Criteria: Patient will be discharged from OT if patient refuses treatment 3 consecutive times without medical reason, if treatment goals not met, if there is a change in medical status, if patient makes no progress towards goals or if patient is discharged from hospital.  The above assessment, treatment plan, treatment alternatives and goals were discussed and mutually agreed upon: by patient  Skeet Simmer 03/08/2021, 4:01 PM

## 2021-03-07 NOTE — TOC Transition Note (Signed)
Transition of Care [facility]) - CM/SW Discharge Note   Patient Details  Name: Michelle Dean MRN: [id number] Date of Birth: 1945/06/02  Transition of Care [facility]) CM/SW Contact:  Kermit Balo, RN Phone Number: 03/07/2021, 2:00 PM   Clinical Narrative:    Patient is discharging to CIR today. CM signing off.   Final next level of care: IP Rehab Facility Barriers to Discharge: No Barriers Identified   Patient Goals and CMS Choice        Discharge Placement                       Discharge Plan and Services                                     Social Determinants of Health (SDOH) Interventions     Readmission Risk Interventions No flowsheet data found.

## 2021-03-07 NOTE — Progress Notes (Signed)
   Inpatient Rehabilitation Admissions Coordinator   CIR bed is available and I can admit today. I will alert acute team and TOC to make the arrangements.  Ottie Glazier, RN, MSN Rehab Admissions Coordinator [phone] 03/07/2021 1:03 PM

## 2021-03-07 NOTE — Progress Notes (Signed)
Inpatient Rehabilitation Admission Medication Review by a Pharmacist  A complete drug regimen review was completed for this patient to identify any potential clinically significant medication issues.  High Risk Drug Classes Is patient taking? Indication by Medication  Antipsychotic No   Anticoagulant Yes Apixaban  Antibiotic No   Opioid No   Antiplatelet No   Hypoglycemics/insulin No   Vasoactive Medication Yes Midodrine  Chemotherapy No   Other No      Type of Medication Issue Identified Description of Issue Recommendation(s)  Drug Interaction(s) (clinically significant)     Duplicate Therapy     Allergy     No Medication Administration End Date     Incorrect Dose     Additional Drug Therapy Needed     Significant med changes from prior encounter (inform family/care partners about these prior to discharge).    Other       Clinically significant medication issues were identified that warrant physician communication and completion of prescribed/recommended actions by midnight of the next day:  No  Name of provider notified for urgent issues identified:   Provider Method of Notification:     Pharmacist comments:   Time spent performing this drug regimen review (minutes):  10   Mosetta Anis 03/07/2021 5:08 PM

## 2021-03-07 NOTE — Progress Notes (Signed)
Physical Therapy Treatment Patient Details Name: Michelle Dean MRN: [id number] DOB: 06/20/1945 Today's Date: 03/07/2021   History of Present Illness 75 yo female presents to [facility] on 10/17 with aphasia. Imaging reveals Left MCA CVA, s/p L common carotid arteriogram with revascularization on 10/17 at [facility]. PMH: COPD (intermittently uses oxygen at home), hypertension, hyperlipidemia.    PT Comments    Pt received in chair, agreeable to therapy session and with good participation and tolerance for gait training in room with emphasis on obstacle negotiation, safe use of AD, problem solving, higher level cognitive tasks limited due to pt global aphasia (receptive seeming more prominent). Pt continues to benefit from multimodal cues and especially gestures/hand over hand assist for safety reminders as she tends to not understand or notice verbal cues. Pt with mild DOE 1/4 and with fair awareness of activity pacing, requesting a rest break prior to therapist recommending one, but is making progress with endurance able to perform slightly longer gait trial this date prior to sitting. Pt continues to benefit from PT services to progress toward functional mobility goals.  Recommendations for follow up therapy are one component of a multi-disciplinary discharge planning process, led by the attending physician.  Recommendations may be updated based on patient status, additional functional criteria and insurance authorization.  Follow Up Recommendations  Acute inpatient rehab (3hours/day)     Assistance Recommended at Discharge    Equipment Recommendations  None recommended by PT    Recommendations for Other Services Rehab consult     Precautions / Restrictions Precautions Precautions: Fall Precaution Comments: receptive aphasia (pt seems HoH but per family she does not normally have hearing issues, no HA) Restrictions Weight Bearing Restrictions: No     Mobility  Bed Mobility                General bed mobility comments: pt received/remained up in chair    Transfers Overall transfer level: Needs assistance Equipment used: Rolling walker (2 wheels) Transfers: Sit to/from Stand Sit to Stand: Supervision;Min guard           General transfer comment: For safety, no physical assist needed, but needs hand over hand assist to reach/push from chair/toilet seat due to receptive aphasia and impulsivity.    Ambulation/Gait Ambulation/Gait assistance: Min assist;Min guard Gait Distance (Feet): 45 Feet (6ft to toilet, then 39ft in room with 2 standing breaks of 1-2 mins ea) Assistive device: Rolling walker (2 wheels) Gait Pattern/deviations: Step-through pattern;Decreased stride length;Drifts right/left Gait velocity: decreased   General Gait Details: Pt responds better to visual cues for directions using RW, DOE 2/4, pt mostly keeps within RW but in narrow spaces she has more difficulty navigating with RW (she is not used to using AD) and needs max cues for stopping/waiting for PTA to remove obstacle or for stepping around it, pt tends to bump into object then lift RW over obstacle despite verbal cues to wait.      Modified Rankin (Stroke Patients Only) Modified Rankin (Stroke Patients Only) Pre-Morbid Rankin Score: No symptoms Modified Rankin: Moderately severe disability     Balance Overall balance assessment: Needs assistance Sitting-balance support: No upper extremity supported;Feet supported Sitting balance-Leahy Scale: Fair     Standing balance support: No upper extremity supported;During functional activity Standing balance-Leahy Scale: Fair        Cognition Arousal/Alertness: Awake/alert Behavior During Therapy: WFL for tasks assessed/performed;Impulsive Overall Cognitive Status: Impaired/Different from baseline Area of Impairment: Safety/judgement;Awareness;Problem solving;Following commands;Memory  Memory: Decreased recall  of precautions;Decreased short-term memory Following Commands: Follows one step commands inconsistently Safety/Judgement: Decreased awareness of safety;Decreased awareness of deficits Awareness: Emergent Problem Solving: Difficulty sequencing;Requires verbal cues;Requires tactile cues (Does well with physical gestures) General Comments: Pt continues to have receptive difficulties but also impulsive so needs max verbal as well as gestural cues for safe navigation within room and needed visual prompting to take standing breaks prior to navigating obstacles/for safe orientation and use of RW. Improved management of RW but still limited awareness of environmental obstacles and pt continues to ignore/not understand cues for safe hand placement with transfers. Pt having difficulty with problem solving and dual tasking during mobility.        Exercises Other Exercises Other Exercises: pt observed using IS after session, has been compliant with this device.    General Comments General comments (skin integrity, edema, etc.): HR WFL per tele, DOE 2/4 and pt remains on 3L O2 [place] (difficult pleth signal due to pt nail polish)      Pertinent Vitals/Pain Pain Assessment: No/denies pain Pain Intervention(s): Monitored during session     PT Goals (current goals can now be found in the care plan section) Acute Rehab PT Goals Patient Stated Goal: pt unable to state, per family to go to rehab and be safe to go home afterward PT Goal Formulation: With patient/family Time For Goal Achievement: 03/12/21 Progress towards PT goals: Progressing toward goals    Frequency    Min 4X/week      PT Plan Current plan remains appropriate       AM-PAC PT "6 Clicks" Mobility   Outcome Measure  Help needed turning from your back to your side while in a flat bed without using bedrails?: A Little Help needed moving from lying on your back to sitting on the side of a flat bed without using bedrails?: A  Little Help needed moving to and from a bed to a chair (including a wheelchair)?: A Little Help needed standing up from a chair using your arms (e.g., wheelchair or bedside chair)?: A Little Help needed to walk in hospital room?: A Little Help needed climbing 3-5 steps with a railing? : A Lot 6 Click Score: 17    End of Session Equipment Utilized During Treatment: Oxygen Activity Tolerance: Patient tolerated treatment well Patient left: in chair;with chair alarm set;with call bell/phone within reach;Other (comment) (MD entering room, PTA entered room ~10 mins later to turn chair alarm back on as it was not on initially) Nurse Communication: Mobility status;Other (comment) (pt does better with visual gestures than verbal instruction due to significant receptive aphasia) PT Visit Diagnosis: Muscle weakness (generalized) (M62.81)     Time: 6384-6659 PT Time Calculation (min) (ACUTE ONLY): 11 min  Charges:  $Gait Training: 8-22 mins                     Tyshawna Alarid P., PTA Acute Rehabilitation Services Pager: [phone] Office: [phone]    Angus Palms 03/07/2021, 2:13 PM

## 2021-03-07 NOTE — Discharge Instructions (Addendum)
Inpatient Rehab Discharge Instructions  Michelle Dean Discharge date and time: No discharge date for patient encounter.   Activities/Precautions/ Functional Status: Activity: As tolerated Diet: Regular Wound Care: Routine skin checks Functional status:  ___ No restrictions     ___ Walk up steps independently ___ 24/7 supervision/assistance   ___ Walk up steps with assistance ___ Intermittent supervision/assistance  ___ Bathe/dress independently ___ Walk with walker     __x_ Bathe/dress with assistance ___ Walk Independently    ___ Shower independently ___ Walk with assistance    ___ Shower with assistance ___ No alcohol     ___ Return to work/school ________  COMMUNITY REFERRALS UPON DISCHARGE:    Home Health:   PT     OT     ST                  Agency: [facility]  Phone: [phone]   Special Instructions: No driving smoking or alcohol  Continue oxygen use as directed   My questions have been answered and I understand these instructions. I will adhere to these goals and the provided educational materials after my discharge from the hospital.  Patient/Caregiver Signature _______________________________ Date __________  Clinician Signature _______________________________________ Date __________  Please bring this form and your medication list with you to all your follow-up doctor's appointments.  STROKE/TIA DISCHARGE INSTRUCTIONS SMOKING Cigarette smoking nearly doubles your risk of having a stroke & is the single most alterable risk factor  If you smoke or have smoked in the last 12 months, you are advised to quit smoking for your health. Most of the excess cardiovascular risk related to smoking disappears within a year of stopping. Ask you doctor about anti-smoking medications [place] Quit Line: 1-800-QUIT NOW Free Smoking Cessation Classes [phone]  CHOLESTEROL Know your levels; limit fat & cholesterol in your diet  Lipid Panel     Component Value Date/Time   CHOL 131  02/26/2021 0525   TRIG 55 02/26/2021 0525   HDL 57 02/26/2021 0525   CHOLHDL 2.3 02/26/2021 0525   VLDL 11 02/26/2021 0525   LDLCALC 63 02/26/2021 0525     Many patients benefit from treatment even if their cholesterol is at goal. Goal: Total Cholesterol (CHOL) less than 160 Goal:  Triglycerides (TRIG) less than 150 Goal:  HDL greater than 40 Goal:  LDL (LDLCALC) less than 100   BLOOD PRESSURE American Stroke Association blood pressure target is less that 120/80 mm/Hg  Your discharge blood pressure is:  BP: (!) 135/58 Monitor your blood pressure Limit your salt and alcohol intake Many individuals will require more than one medication for high blood pressure  DIABETES (A1c is a blood sugar average for last 3 months) Goal HGBA1c is under 7% (HBGA1c is blood sugar average for last 3 months)  Diabetes: No known diagnosis of diabetes    Lab Results  Component Value Date   HGBA1C 5.5 02/26/2021    Your HGBA1c can be lowered with medications, healthy diet, and exercise. Check your blood sugar as directed by your physician Call your physician if you experience unexplained or low blood sugars.  PHYSICAL ACTIVITY/REHABILITATION Goal is 30 minutes at least 4 days per week  Activity: Increase activity slowly, Therapies: Physical Therapy: Home Health Return to work:  Activity decreases your risk of heart attack and stroke and makes your heart stronger.  It helps control your weight and blood pressure; helps you relax and can improve your mood. Participate in a regular exercise program. Talk with your  doctor about the best form of exercise for you (dancing, walking, swimming, cycling).  DIET/WEIGHT Goal is to maintain a healthy weight  Your discharge diet is:  Diet Order             Diet Heart Room service appropriate? Yes; Fluid consistency: Thin  Diet effective now                   liquids Your height is:    Your current weight is:   Your Body Mass Index (BMI) is:    Following  the type of diet specifically designed for you will help prevent another stroke. Your goal weight range is:   Your goal Body Mass Index (BMI) is 19-24. Healthy food habits can help reduce 3 risk factors for stroke:  High cholesterol, hypertension, and excess weight.  RESOURCES Stroke/Support Group:  Call [phone]   STROKE EDUCATION PROVIDED/REVIEWED AND GIVEN TO PATIENT Stroke warning signs and symptoms How to activate emergency medical system (call 911). Medications prescribed at discharge. Need for follow-up after discharge. Personal risk factors for stroke. Pneumonia vaccine given: No Flu vaccine given: No My questions have been answered, the writing is legible, and I understand these instructions.  I will adhere to these goals & educational materials that have been provided to me after my discharge from the hospital.    ------------------------------------------------------------------------------------------------------ Information on my medicine - ELIQUIS (apixaban)  This medication education was reviewed with me or my healthcare representative as part of my discharge preparation.  The pharmacist that spoke with me during my hospital stay was:  Elisha Headland Reome, RPH  Why was Eliquis prescribed for you? Eliquis was prescribed for you to reduce the risk of forming blood clots that can cause a stroke if you have a medical condition called atrial fibrillation (a type of irregular heartbeat) OR to reduce the risk of a blood clots forming after orthopedic surgery.  What do You need to know about Eliquis ? Take your Eliquis TWICE DAILY - one tablet in the morning and one tablet in the evening with or without food.  It would be best to take the doses about the same time each day.  If you have difficulty swallowing the tablet whole please discuss with your pharmacist how to take the medication safely.  Take Eliquis exactly as prescribed by your doctor and DO NOT stop taking Eliquis  without talking to the doctor who prescribed the medication.  Stopping may increase your risk of developing a new clot or stroke.  Refill your prescription before you run out.  After discharge, you should have regular check-up appointments with your healthcare provider that is prescribing your Eliquis.  In the future your dose may need to be changed if your kidney function or weight changes by a significant amount or as you get older.  What do you do if you miss a dose? If you miss a dose, take it as soon as you remember on the same day and resume taking twice daily.  Do not take more than one dose of ELIQUIS at the same time.  Important Safety Information A possible side effect of Eliquis is bleeding. You should call your healthcare provider right away if you experience any of the following: Bleeding from an injury or your nose that does not stop. Unusual colored urine (red or dark brown) or unusual colored stools (red or black). Unusual bruising for unknown reasons. A serious fall or if you hit your head (even if there is  no bleeding).  Some medicines may interact with Eliquis and might increase your risk of bleeding or clotting while on Eliquis. To help avoid this, consult your healthcare provider or pharmacist prior to using any new prescription or non-prescription medications, including herbals, vitamins, non-steroidal anti-inflammatory drugs (NSAIDs) and supplements.  This website has more information on Eliquis (apixaban): [email or web address].cy.  ------------------------------------------------------------------------------------------------------------

## 2021-03-07 NOTE — Progress Notes (Signed)
Patient ID: Michelle Dean, female   DOB: [birth year], 75 y.o.   MRN: [id number] Met with the patient to introduce self and review rehab process, therapy schedule and plan of care however patient with receptive and expressive aphasia and had difficulty understanding introduction. Handbook of information left in the room for patient's son. Will need to follow up with family. Continue to follow along to discharge to address educational needs and medications with secondary risk management tips. Collaborate with the team to facilitate preparation for discharge. Margarito Liner

## 2021-03-07 NOTE — Discharge Summary (Addendum)
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/M3 occlusion s/p TNK and IR, atrial fibrillation, HTN, HLD Active Problems:   Acute ischemic stroke (HCC)   Stroke (cerebrum) (HCC)   Middle cerebral artery embolism, left   Aphasia   Atrial fibrillation with rapid ventricular response (HCC)   Atrial flutter with rapid ventricular response (HCC)   Benign hypertension   Mixed hyperlipidemia   Paroxysmal atrial fibrillation (HCC)   Long term (current) use of anticoagulants   Long term current use of antiarrhythmic drug   Acute heart failure with preserved ejection fraction (HFpEF) (HCC)   Hypotension, iatrogenic   Medications to be continued on Rehab Allergies as of 03/07/2021       Reactions   Aspirin    Other reaction(s): Bleeding   Penicillins Hives, Rash   Lisinopril Cough        Medication List     STOP taking these medications    losartan 25 MG tablet Commonly known as: COZAAR       TAKE these medications    acetaminophen 325 MG tablet Commonly known as: TYLENOL Take 2 tablets (650 mg total) by mouth every 4 (four) hours as needed for mild pain (or temp > 37.5 C (99.5 F)).   albuterol (2.5 MG/3ML) 0.083% nebulizer solution Commonly known as: PROVENTIL Take 2.5 mg by nebulization every 4 (four) hours as needed for wheezing.   amiodarone 200 MG tablet Commonly known as: PACERONE Take 1 tablet (200 mg total) by mouth daily.   amiodarone 200 MG tablet Commonly known as: PACERONE Take 1 tablet (200 mg total) by mouth daily. Start taking on: March 08, 2021   apixaban 5 MG Tabs tablet Commonly known as: ELIQUIS Take 1 tablet (5 mg  total) by mouth 2 (two) times daily.   atorvastatin 10 MG tablet Commonly known as: LIPITOR Take 10 mg by mouth daily.   carboxymethylcellulose 0.5 % Soln Commonly known as: REFRESH PLUS Place 1 drop into both eyes 4 (four) times daily as needed (dry eyes).   Dulera 100-5 MCG/ACT Aero Generic drug: mometasone-formoterol Inhale 2 puffs into the lungs 2 (two) times daily.   magnesium oxide 400 (240 Mg) MG tablet Commonly known as: MAG-OX Take 1 tablet (400 mg total) by mouth daily. Start taking on: March 08, 2021   multivitamin with minerals Tabs tablet Take 1 tablet by mouth daily. Start taking on: March 08, 2021   omeprazole 20 MG capsule Commonly known as: PRILOSEC Take 20 mg by mouth daily.   senna-docusate 8.6-50 MG tablet Commonly known as: Senokot-S Take 1 tablet by mouth at bedtime as needed for mild constipation.   Spiriva HandiHaler 18 MCG inhalation capsule Generic drug: tiotropium Place 18 mcg into inhaler and inhale daily.   verapamil 40 MG tablet Commonly known as: CALAN Take 1 tablet (40 mg total) by mouth every 8 (eight) hours.        LABORATORY STUDIES CBC    Component Value Date/Time   WBC 11.3 (H) 03/07/2021 0250   RBC 3.69 (L) 03/07/2021 0250   HGB 11.2 (L) 03/07/2021 0250   HCT 34.8 (L) 03/07/2021 0250   PLT 290 03/07/2021 0250  MCV 94.3 03/07/2021 0250   MCH 30.4 03/07/2021 0250   MCHC 32.2 03/07/2021 0250   RDW 13.4 03/07/2021 0250   LYMPHSABS 1.0 02/26/2021 0525   MONOABS 0.9 02/26/2021 0525   EOSABS 0.0 02/26/2021 0525   BASOSABS 0.0 02/26/2021 0525   CMP    Component Value Date/Time   NA 138 03/07/2021 0250   K 3.7 03/07/2021 0250   CL 96 (L) 03/07/2021 0250   CO2 34 (H) 03/07/2021 0250   GLUCOSE 90 03/07/2021 0250   BUN 24 (H) 03/07/2021 0250   CREATININE 1.15 (H) 03/07/2021 0250   CALCIUM 9.0 03/07/2021 0250   PROT 6.8 02/25/2021 1441   ALBUMIN 3.9 02/25/2021 1441   AST 20 02/25/2021 1441   ALT 14 02/25/2021  1441   ALKPHOS 56 02/25/2021 1441   BILITOT 0.6 02/25/2021 1441   GFRNONAA 49 (L) 03/07/2021 0250   COAGS Lab Results  Component Value Date   INR 1.3 (H) 03/04/2021   INR 1.1 03/01/2021   INR 1.0 02/25/2021   Lipid Panel    Component Value Date/Time   CHOL 131 02/26/2021 0525   TRIG 55 02/26/2021 0525   HDL 57 02/26/2021 0525   CHOLHDL 2.3 02/26/2021 0525   VLDL 11 02/26/2021 0525   LDLCALC 63 02/26/2021 0525   HgbA1C  Lab Results  Component Value Date   HGBA1C 5.5 02/26/2021   Urinalysis    Component Value Date/Time   COLORURINE YELLOW 02/26/2021 0256   APPEARANCEUR CLEAR 02/26/2021 0256   LABSPEC >1.046 (H) 02/26/2021 0256   PHURINE 6.0 02/26/2021 0256   GLUCOSEU NEGATIVE 02/26/2021 0256   HGBUR LARGE (A) 02/26/2021 0256   BILIRUBINUR NEGATIVE 02/26/2021 0256   KETONESUR 20 (A) 02/26/2021 0256   PROTEINUR NEGATIVE 02/26/2021 0256   NITRITE NEGATIVE 02/26/2021 0256   LEUKOCYTESUR NEGATIVE 02/26/2021 0256   Urine Drug Screen     Component Value Date/Time   LABOPIA NONE DETECTED 02/26/2021 0256   COCAINSCRNUR NONE DETECTED 02/26/2021 0256   LABBENZ NONE DETECTED 02/26/2021 0256   AMPHETMU NONE DETECTED 02/26/2021 0256   THCU NONE DETECTED 02/26/2021 0256   LABBARB NONE DETECTED 02/26/2021 0256    Alcohol Level    Component Value Date/Time   ETH <10 02/25/2021 1441     SIGNIFICANT DIAGNOSTIC STUDIES CT HEAD WO CONTRAST ( )  Result Date: 02/26/2021 CLINICAL DATA:  Stroke follow-up EXAM: CT HEAD WITHOUT CONTRAST TECHNIQUE: Contiguous axial images were obtained from the base of the skull through the vertex without intravenous contrast. COMPARISON:  None. FINDINGS: Brain: High-density material in the left temporal and posterior frontal lobe, status post interval interventional thrombectomy. Area of hypodensity in the left temporal lobe, most likely edema. No new cortical infarct. No hydrocephalus, extra-axial collection, mass, mass effect, or midline shift.  Vascular: No hyperdense vessel or unexpected calcification. Skull: Normal. Negative for fracture or focal lesion. Sinuses/Orbits: No acute finding. Other: None. IMPRESSION: 1. Hyperdense material in the left temporal and frontal lobes, in an area that has recently been treated with interventional thrombectomy, favored to represent contrast staining and not hemorrhage. Recommend follow-up exam in 6 hours for further evaluation. 2. Hypodensity in the left temporal lobe, possibly edema from recent infarct. These results were called by telephone at the time of interpretation on 02/26/2021 at 3:02 Am to provider [facility] , who verbally acknowledged these results. Electronically Signed   By: Wiliam Ke M.D.   On: 02/26/2021 03:03   MR BRAIN WO CONTRAST  Result Date: 02/26/2021 CLINICAL DATA:  Stroke follow-up. Aphasia. Status post TNK and revascularization of an occluded left MCA branch vessel. EXAM: MRI HEAD WITHOUT CONTRAST TECHNIQUE: Multiplanar, multiecho pulse sequences of the brain and surrounding structures were obtained without intravenous contrast. COMPARISON:  Head CT 02/26/2021 FINDINGS: Brain: There is a moderate-sized acute infarct involving the left temporal lobe, inferior left parietal lobe, lateral left occipital lobe, and left insula (posterior MCA territory and MCA/PCA border zone). There is associated cytotoxic edema with sulcal effacement. Associated susceptibility artifact along the left sylvian fissure and left temporoparietal sulci corresponds to hyperdensity on CT and may reflect a combination of subarachnoid hemorrhage and cortical petechial hemorrhage. T2 hyperintensities elsewhere in the cerebral white matter bilaterally are nonspecific but compatible with mild-to-moderate chronic small vessel ischemic disease. There is mild cerebral atrophy. No midline shift or extra-axial fluid collection is evident. Vascular: Major intracranial vascular flow voids are preserved. Skull and upper  cervical spine: Unremarkable bone marrow signal. Sinuses/Orbits: Bilateral cataract extraction. Trace left mastoid effusion. Clear paranasal sinuses. Other: None. IMPRESSION: 1. Moderate-sized acute posterior left MCA infarct. 2. Mild-to-moderate chronic small vessel ischemic disease. Electronically Signed   By: Sebastian Ache M.D.   On: 02/26/2021 16:21   IR CT Head Ltd  Result Date: 02/28/2021 INDICATION: New onset aphasia. Occluded left middle cerebral artery inferior division in the distal M2 M3 region on CT angiogram of the head and neck. EXAM: 1. EMERGENT LARGE VESSEL OCCLUSION THROMBOLYSIS (anterior CIRCULATION) COMPARISON:  CT angiogram of the head and neck of 02/25/2021. MEDICATIONS: Ancef 2 g IV antibiotic was administered within 1 hour of the procedure. ANESTHESIA/SEDATION: General anesthesia. CONTRAST:  Omnipaque 300 approximately 90 cc. FLUOROSCOPY TIME:  Fluoroscopy Time: 49 minutes 0 seconds (2707 mGy). COMPLICATIONS: None immediate. TECHNIQUE: Following a full explanation of the procedure along with the potential associated complications, an informed witnessed consent was obtained. The risks of intracranial hemorrhage of 10%, worsening neurological deficit, ventilator dependency, death and inability to revascularize were all reviewed in detail with the patient's son. The patient was then put under general anesthesia by the Department of Anesthesiology at [facility]. The right groin was prepped and draped in the usual sterile fashion. Thereafter using modified Seldinger technique, transfemoral access into the right common femoral artery was obtained without difficulty. Over a 0.035 inch guidewire an8 [place] Pinnacle 25 cm sheath was inserted. Through this, and also over a 0.035 inch guidewire a 5 [place] JB 1 catheter was advanced to the aortic arch region and selectively positioned in the left common carotid artery. FINDINGS: The left common carotid arteriogram demonstrates the left  external carotid artery and its major branches to be widely patent. The left internal carotid artery at the bulb to the skull base is widely patent. The petrous, the cavernous and the supraclinoid segments are widely patent. A left posterior communicating artery is seen opacifying the left posterior cerebral artery distribution. The left anterior cerebral artery opacifies into the capillary and venous phases. The left middle cerebral artery M1 segment and superior division is widely patent. The inferior division superior parietal branch demonstrates wide patency into the capillary and venous phases. Complete angiographic occlusion is seen of the inferior division in the distal M2 M3 segment. PROCEDURE: Over a 0.035 inch exchange Roadrunner guidewire, the 5 [place] diagnostic catheter was exchanged for a 95 cm 087 balloon guide catheter which had been prepped with 50% contrast and 50% heparinized saline infusion and advanced to the distal cervical left ICA. The guidewire was removed. Good aspiration obtained from the  hub of the balloon guide catheter. Gentle control arteriogram performed through the balloon guide catheter demonstrated no evidence of spasms, dissections or of intraluminal filling defects. No change was seen in the intracranial circulation. Over a 0.014 inch standard Synchro micro guidewire with a moderate J configuration, an 021 160 cm microcatheter was advanced in combination with an 071 132 cm Zoom aspiration catheter to supraclinoid left ICA. The micro guidewire was then gently manipulated with a torque device and advanced into the occluded angular branch of the inferior division in the distal M2 M3 segment. The guidewire was removed. Slow aspiration of contrast was noted from the hub of the microcatheter. A gentle contrast injection demonstrates safe position of the tip of the microcatheter which was then connected to continuous heparinized saline infusion. A 3 mm x 40 mm Solitaire X retrieval  device was then advanced to the distal end of the microcatheter and deployed in the usual manner. The 071 Zoom aspiration catheter was advanced into the origin of the inferior division. With constant aspiration at the hub of the balloon guide catheter with proximal flow arrest in the left internal carotid artery and aspiration with a Penumbra device the hub of the Zoom aspiration catheter for approximately 2 minutes, the combination of the retrieval device, the microcatheter and the Zoom aspiration catheter was retrieved and removed. A control arteriogram performed following reversal of flow arrest in the left internal carotid artery demonstrated modest improvement in distal flow into the M3 region of the angular branch. However, this was not sustained. A second pass was then made again using combination of the 132 071 Zoom aspiration catheter advanced with an 021 microcatheter again over a 0.014 inch. Access was obtained into the occluded inferior division angular branch followed by the microcatheter. However, the microcatheter could not be advanced more distally. A gentle control arteriogram performed demonstrated safe position without evidence of any extravasation. At this time the Zoom aspiration catheter was advanced to as close as possible to the occluded middle cerebral artery branch. Contact aspiration was then performed with an 035 Zoom aspiration catheter which had been advanced and engaged in the occluded segment of the angular branch. Aspiration was then performed with a Penumbra aspiration device at the hub of the 071 Zoom aspiration catheter with gentle aspiration with a 20 mL syringe at the hub of the 035 Zoom aspiration catheter for approximately 2 minutes. After that, the combination of the Zoom aspiration catheter was retrieved and removed, copious amounts of clot was noted in the aspirate from the 071 Zoom aspiration catheter. A control arteriogram performed through the balloon guide catheter in  the left internal carotid artery continued to demonstrate near occlusion of the distal left M2 segment into the M3 region. 3 aliquots of 25 mcg of nitroglycerin was then infused through the balloon aspiration catheter in the left internal carotid artery. However, again noted was just a minimal amount of distal flow past the occlusion site. A third pass was made again using just an 021 160 cm microcatheter which was advanced to the site of the occlusion of the angular branch over a 0.014 inch standard Synchro micro guidewire without difficulty. Multiple attempts were made to advance the micro guidewire gently through the occlusive site without success. The microcatheter was removed. Control arteriogram performed through the balloon catheter in the left internal carotid artery continued to demonstrate wide patency of the left middle cerebral artery territory superior divisions and the anterior parietal branch of the inferior division. No significant  change was noted in the occluded angular branch in the distal left M2 M3 region. Throughout the procedure, the patient's blood pressure and neurological status remained stable. No evidence of extravasation was noted. The balloon guide was then removed. The 8 French Pinnacle sheath was removed. Hemostasis was achieved with a 6 French Angio-Seal closure device, and a quick clot compression for about 20 minutes. A CT of the brain demonstrated no evidence of mass effect or midline shift. Contrast was noted in the left anterior perisylvian fissure, and also in the temporoparietal convexity area. Distal pulses remained Dopplerable in both feet unchanged. The patient was extubated. Upon recovery, the patient was able to move all fours spontaneously and to command. She did exhibit difficulty with comprehension. She was then transferred to the neuro ICU for post revascularization care. IMPRESSION: Status post attempted revascularization of occluded distal left MCA inferior division  angular branch in the M2 M3 region with 1 pass with a 3 mm x 40 mm Solitaire X retrieval device and contact aspiration, 1 pass with contact aspiration and 1 pass with mechanical thrombolysis with a TICI 2C revascularization. PLAN: Follow-up in the clinic approximately 4 weeks post discharge. Electronically Signed   By: Julieanne Cotton M.D.   On: 02/28/2021 08:36   DG CHEST PORT 1 VIEW  Result Date: 03/02/2021 CLINICAL DATA:  Cough EXAM: PORTABLE CHEST 1 VIEW COMPARISON:  None FINDINGS: Normal heart size and mediastinal contours. Lung volumes are low. No pleural effusion or edema. No airspace consolidation. Visualized osseous structures are unremarkable. IMPRESSION: Low lung volumes.  No signs of pneumonia. Electronically Signed   By: Signa Kell M.D.   On: 03/02/2021 09:45   ECHOCARDIOGRAM COMPLETE  Result Date: 02/26/2021    ECHOCARDIOGRAM REPORT   Patient Name:   MYRIAN BOTELLO Date of Exam: 02/26/2021 Medical Rec #:  [id number]     Height:       67.0 in Accession #:    [id number]    Weight:       198.9 lb Date of Birth:  Mar 13, 1946    BSA:          2.018 m Patient Age:    74 years      BP:           161/66 mmHg Patient Gender: F             HR:           86 bpm. Exam Location:  Inpatient Procedure: 2D Echo, Color Doppler and Cardiac Doppler Indications:    Stroke i63.9  History:        Patient has no prior history of Echocardiogram examinations.  Sonographer:    Irving Burton Senior RDCS Referring Phys: [phone] ERIC LINDZEN IMPRESSIONS  1. Left ventricular ejection fraction, by estimation, is 55 to 60%. The left ventricle has normal function. The left ventricle has no regional wall motion abnormalities. Left ventricular diastolic parameters are consistent with Grade II diastolic dysfunction (pseudonormalization).  2. Right ventricular systolic function is normal. The right ventricular size is mildly enlarged. Tricuspid regurgitation signal is inadequate for assessing PA pressure.  3. Left atrial size was  mildly dilated.  4. Right atrial size was mildly dilated.  5. The mitral valve is normal in structure. No evidence of mitral valve regurgitation. No evidence of mitral stenosis.  6. The aortic valve is tricuspid. Aortic valve regurgitation is not visualized. Mild aortic valve sclerosis is present, with no evidence of aortic valve stenosis.  7. The inferior vena  cava is dilated in size with >50% respiratory variability, suggesting right atrial pressure of 8 mmHg. FINDINGS  Left Ventricle: Left ventricular ejection fraction, by estimation, is 55 to 60%. The left ventricle has normal function. The left ventricle has no regional wall motion abnormalities. The left ventricular internal cavity size was normal in size. There is  no left ventricular hypertrophy. Left ventricular diastolic parameters are consistent with Grade II diastolic dysfunction (pseudonormalization). Right Ventricle: The right ventricular size is mildly enlarged. No increase in right ventricular wall thickness. Right ventricular systolic function is normal. Tricuspid regurgitation signal is inadequate for assessing PA pressure. Left Atrium: Left atrial size was mildly dilated. Right Atrium: Right atrial size was mildly dilated. Pericardium: There is no evidence of pericardial effusion. Mitral Valve: The mitral valve is normal in structure. No evidence of mitral valve regurgitation. No evidence of mitral valve stenosis. Tricuspid Valve: The tricuspid valve is normal in structure. Tricuspid valve regurgitation is not demonstrated. Aortic Valve: The aortic valve is tricuspid. Aortic valve regurgitation is not visualized. Mild aortic valve sclerosis is present, with no evidence of aortic valve stenosis. Pulmonic Valve: The pulmonic valve was normal in structure. Pulmonic valve regurgitation is not visualized. Aorta: The aortic root is normal in size and structure. Venous: The inferior vena cava is dilated in size with greater than 50% respiratory  variability, suggesting right atrial pressure of 8 mmHg. IAS/Shunts: No atrial level shunt detected by color flow Doppler.  LEFT VENTRICLE PLAX 2D LVIDd:         3.80 cm   Diastology LVIDs:         2.40 cm   LV e' medial:    6.96 cm/s LV PW:         0.80 cm   LV E/e' medial:  11.3 LV IVS:        0.90 cm   LV e' lateral:   9.25 cm/s LVOT diam:     1.90 cm   LV E/e' lateral: 8.5 LV SV:         51 LV SV Index:   25 LVOT Area:     2.84 cm  RIGHT VENTRICLE RV S prime:     15.40 cm/s TAPSE (M-mode): 2.8 cm LEFT ATRIUM             Index        RIGHT ATRIUM           Index LA diam:        1.90 cm 0.94 cm/m   RA Area:     20.60 cm LA Vol (A2C):   45.9 ml 22.75 ml/m  RA Volume:   62.20 ml  30.83 ml/m LA Vol (A4C):   60.5 ml 29.99 ml/m LA Biplane Vol: 53.0 ml 26.27 ml/m  AORTIC VALVE LVOT Vmax:   109.00 cm/s LVOT Vmean:  72.733 cm/s LVOT VTI:    0.179 m  AORTA Ao Root diam: 3.30 cm Ao Asc diam:  3.30 cm MITRAL VALVE MV Area (PHT): 3.61 cm    SHUNTS MV Decel Time: 210 msec    Systemic VTI:  0.18 m MV E velocity: 78.50 cm/s  Systemic Diam: 1.90 cm MV A velocity: 74.40 cm/s MV E/A ratio:  1.06 Dalton McleanMD Electronically signed by Wilfred Lacy Signature Date/Time: 02/26/2021/1:42:18 PM    Final    ECHO TEE  Result Date: 03/06/2021    TRANSESOPHOGEAL ECHO REPORT   Patient Name:   KAELYNNE CHRISTLEY Date of Exam: 03/01/2021 Medical Rec #:  [id number]     Height:       67.0 in Accession #:    [id number]    Weight:       198.9 lb Date of Birth:  10-23-1945    BSA:          2.018 m Patient Age:    74 years      BP:           175/132 mmHg Patient Gender: F             HR:           106 bpm. Exam Location:  Inpatient Procedure: Transesophageal Echo, Cardiac Doppler and Color Doppler Indications:     Atrial Fibrillation / Flutter  History:         Patient has prior history of Echocardiogram examinations, most                  recent 02/26/2021.  Sonographer:     Margreta Journey RDCS Referring Phys:  [id number] Tessa Lerner  Diagnosing Phys: Tessa Lerner DO PROCEDURE: After discussion of the risks and benefits of a TEE, an informed consent was obtained from the patient. The transesophogeal probe was passed without difficulty through the esophogus of the patient. Imaged were obtained with the patient in a left lateral decubitus position. Sedation performed by different physician. The patient was monitored while under deep sedation. Anesthestetic sedation was provided intravenously by Anesthesiology: 323.25mg  of Propofol,  of Lidocaine. Image quality was good. The patient's vital signs; including heart rate, blood pressure, and oxygen saturation; remained stable throughout the procedure. Supplementary images were obtained from transthoracic windows as indicated to answer the clinical question. The patient developed no complications during the procedure. A successful direct current cardioversion was performed at 100 joules with 1 attempt. Transitioned to normal sinus post direct current cardioversion; however, converted back to atrial fibrillation by the time she was in post-procedural holding. IMPRESSIONS  1. Left ventricular ejection fraction, by estimation, is 50 to 55%. The left ventricle has low normal function. The left ventricle has no regional wall motion abnormalities.  2. Right ventricular systolic function is normal. The right ventricular size is mildly enlarged.  3. Left atrial size was mildly dilated. No left atrial/left atrial appendage thrombus was detected. The LAA emptying velocity was 61 cm/s.  4. Right atrial size was mildly dilated.  5. The mitral valve is normal in structure. Mild mitral valve regurgitation. No evidence of mitral stenosis.  6. The aortic valve is normal in structure. Aortic valve regurgitation is not visualized. No aortic stenosis is present.  7. There is mild (Grade II) plaque involving the descending aorta.  8. Agitated saline contrast bubble study was negative, with no evidence of any  interatrial shunt. FINDINGS  Left Ventricle: Left ventricular ejection fraction, by estimation, is 50 to 55%. The left ventricle has low normal function. The left ventricle has no regional wall motion abnormalities. The left ventricular internal cavity size was normal in size. Right Ventricle: The right ventricular size is mildly enlarged. No increase in right ventricular wall thickness. Right ventricular systolic function is normal. Left Atrium: Left atrial size was mildly dilated. No left atrial/left atrial appendage thrombus was detected. The LAA emptying velocity was 61 cm/s. Right Atrium: Right atrial size was mildly dilated. Pericardium: Trivial pericardial effusion is present. Mitral Valve: The mitral valve is normal in structure. Mild mitral valve regurgitation. No evidence of mitral valve stenosis. Tricuspid Valve: The tricuspid valve is normal in  structure. Tricuspid valve regurgitation is mild . No evidence of tricuspid stenosis. Aortic Valve: The aortic valve is normal in structure. Aortic valve regurgitation is not visualized. No aortic stenosis is present. Pulmonic Valve: The pulmonic valve was grossly normal. Pulmonic valve regurgitation is not visualized. No evidence of pulmonic stenosis. Aorta: The aortic root and ascending aorta are structurally normal, with no evidence of dilitation. There is mild (Grade II) plaque involving the descending aorta. IAS/Shunts: There is redundancy of the interatrial septum. The atrial septum is grossly normal. Agitated saline contrast was given intravenously to evaluate for intracardiac shunting. Agitated saline contrast bubble study was negative, with no evidence of any interatrial shunt.  LEFT VENTRICLE PLAX 2D LVOT diam:     2.30 cm LVOT Area:     4.15 cm   SHUNTS Systemic Diam: 2.30 cm Sunit Tolia DO Electronically signed by Tessa Lerner DO Signature Date/Time: 03/06/2021/12:14:56 AM    Final    IR PERCUTANEOUS ART THROMBECTOMY/INFUSION INTRACRANIAL INC DIAG  ANGIO  Result Date: 02/28/2021 INDICATION: New onset aphasia. Occluded left middle cerebral artery inferior division in the distal M2 M3 region on CT angiogram of the head and neck. EXAM: 1. EMERGENT LARGE VESSEL OCCLUSION THROMBOLYSIS (anterior CIRCULATION) COMPARISON:  CT angiogram of the head and neck of 02/25/2021. MEDICATIONS: Ancef 2 g IV antibiotic was administered within 1 hour of the procedure. ANESTHESIA/SEDATION: General anesthesia. CONTRAST:  Omnipaque 300 approximately 90 cc. FLUOROSCOPY TIME:  Fluoroscopy Time: 49 minutes 0 seconds (2707 mGy). COMPLICATIONS: None immediate. TECHNIQUE: Following a full explanation of the procedure along with the potential associated complications, an informed witnessed consent was obtained. The risks of intracranial hemorrhage of 10%, worsening neurological deficit, ventilator dependency, death and inability to revascularize were all reviewed in detail with the patient's son. The patient was then put under general anesthesia by the Department of Anesthesiology at [facility]. The right groin was prepped and draped in the usual sterile fashion. Thereafter using modified Seldinger technique, transfemoral access into the right common femoral artery was obtained without difficulty. Over a 0.035 inch guidewire an8 [place] Pinnacle 25 cm sheath was inserted. Through this, and also over a 0.035 inch guidewire a 5 [place] JB 1 catheter was advanced to the aortic arch region and selectively positioned in the left common carotid artery. FINDINGS: The left common carotid arteriogram demonstrates the left external carotid artery and its major branches to be widely patent. The left internal carotid artery at the bulb to the skull base is widely patent. The petrous, the cavernous and the supraclinoid segments are widely patent. A left posterior communicating artery is seen opacifying the left posterior cerebral artery distribution. The left anterior cerebral artery opacifies  into the capillary and venous phases. The left middle cerebral artery M1 segment and superior division is widely patent. The inferior division superior parietal branch demonstrates wide patency into the capillary and venous phases. Complete angiographic occlusion is seen of the inferior division in the distal M2 M3 segment. PROCEDURE: Over a 0.035 inch exchange Roadrunner guidewire, the 5 [place] diagnostic catheter was exchanged for a 95 cm 087 balloon guide catheter which had been prepped with 50% contrast and 50% heparinized saline infusion and advanced to the distal cervical left ICA. The guidewire was removed. Good aspiration obtained from the hub of the balloon guide catheter. Gentle control arteriogram performed through the balloon guide catheter demonstrated no evidence of spasms, dissections or of intraluminal filling defects. No change was seen in the intracranial circulation. Over a 0.014  inch standard Synchro micro guidewire with a moderate J configuration, an 021 160 cm microcatheter was advanced in combination with an 071 132 cm Zoom aspiration catheter to supraclinoid left ICA. The micro guidewire was then gently manipulated with a torque device and advanced into the occluded angular branch of the inferior division in the distal M2 M3 segment. The guidewire was removed. Slow aspiration of contrast was noted from the hub of the microcatheter. A gentle contrast injection demonstrates safe position of the tip of the microcatheter which was then connected to continuous heparinized saline infusion. A 3 mm x 40 mm Solitaire X retrieval device was then advanced to the distal end of the microcatheter and deployed in the usual manner. The 071 Zoom aspiration catheter was advanced into the origin of the inferior division. With constant aspiration at the hub of the balloon guide catheter with proximal flow arrest in the left internal carotid artery and aspiration with a Penumbra device the hub of the Zoom  aspiration catheter for approximately 2 minutes, the combination of the retrieval device, the microcatheter and the Zoom aspiration catheter was retrieved and removed. A control arteriogram performed following reversal of flow arrest in the left internal carotid artery demonstrated modest improvement in distal flow into the M3 region of the angular branch. However, this was not sustained. A second pass was then made again using combination of the 132 071 Zoom aspiration catheter advanced with an 021 microcatheter again over a 0.014 inch. Access was obtained into the occluded inferior division angular branch followed by the microcatheter. However, the microcatheter could not be advanced more distally. A gentle control arteriogram performed demonstrated safe position without evidence of any extravasation. At this time the Zoom aspiration catheter was advanced to as close as possible to the occluded middle cerebral artery branch. Contact aspiration was then performed with an 035 Zoom aspiration catheter which had been advanced and engaged in the occluded segment of the angular branch. Aspiration was then performed with a Penumbra aspiration device at the hub of the 071 Zoom aspiration catheter with gentle aspiration with a 20 mL syringe at the hub of the 035 Zoom aspiration catheter for approximately 2 minutes. After that, the combination of the Zoom aspiration catheter was retrieved and removed, copious amounts of clot was noted in the aspirate from the 071 Zoom aspiration catheter. A control arteriogram performed through the balloon guide catheter in the left internal carotid artery continued to demonstrate near occlusion of the distal left M2 segment into the M3 region. 3 aliquots of 25 mcg of nitroglycerin was then infused through the balloon aspiration catheter in the left internal carotid artery. However, again noted was just a minimal amount of distal flow past the occlusion site. A third pass was made again  using just an 021 160 cm microcatheter which was advanced to the site of the occlusion of the angular branch over a 0.014 inch standard Synchro micro guidewire without difficulty. Multiple attempts were made to advance the micro guidewire gently through the occlusive site without success. The microcatheter was removed. Control arteriogram performed through the balloon catheter in the left internal carotid artery continued to demonstrate wide patency of the left middle cerebral artery territory superior divisions and the anterior parietal branch of the inferior division. No significant change was noted in the occluded angular branch in the distal left M2 M3 region. Throughout the procedure, the patient's blood pressure and neurological status remained stable. No evidence of extravasation was noted. The balloon guide was  then removed. The 8 French Pinnacle sheath was removed. Hemostasis was achieved with a 6 French Angio-Seal closure device, and a quick clot compression for about 20 minutes. A CT of the brain demonstrated no evidence of mass effect or midline shift. Contrast was noted in the left anterior perisylvian fissure, and also in the temporoparietal convexity area. Distal pulses remained Dopplerable in both feet unchanged. The patient was extubated. Upon recovery, the patient was able to move all fours spontaneously and to command. She did exhibit difficulty with comprehension. She was then transferred to the neuro ICU for post revascularization care. IMPRESSION: Status post attempted revascularization of occluded distal left MCA inferior division angular branch in the M2 M3 region with 1 pass with a 3 mm x 40 mm Solitaire X retrieval device and contact aspiration, 1 pass with contact aspiration and 1 pass with mechanical thrombolysis with a TICI 2C revascularization. PLAN: Follow-up in the clinic approximately 4 weeks post discharge. Electronically Signed   By: Julieanne Cotton M.D.   On: 02/28/2021 08:36    CT HEAD CODE STROKE WO CONTRAST  Result Date: 02/25/2021 CLINICAL DATA:  Code stroke. EXAM: CT HEAD WITHOUT CONTRAST TECHNIQUE: Contiguous axial images were obtained from the base of the skull through the vertex without intravenous contrast. COMPARISON:  None. FINDINGS: Brain: No acute intracranial hemorrhage, mass effect, or edema. Gray-white differentiation is preserved. Age-indeterminate small vessel infarct of the left gangliocapsular region. Additional patchy low-density in the supratentorial white matter is nonspecific but may reflect mild chronic microvascular ischemic changes. Ventricles and sulci are within normal limits in size and configuration. No extra-axial collection. Vascular: No hyperdense vessel. Mild intracranial atherosclerotic calcification at the skull base. Skull: Unremarkable. Sinuses/Orbits: Aerated. Other: Mastoid air cells are clear. ASPECTS ([place] Stroke Program Early CT Score) - Ganglionic level infarction (caudate, lentiform nuclei, internal capsule, insula, M1-M3 cortex): 7 - Supraganglionic infarction (M4-M6 cortex): 3 Total score (0-10 with 10 being normal): 10 IMPRESSION: There is no acute intracranial hemorrhage or evidence of acute infarction. ASPECT score is 10. Age-indeterminate small vessel infarct of the left gangliocapsular region. These results were called by telephone at the time of interpretation on 02/25/2021 at 2:37 pm to provider Tanda Rockers , who verbally acknowledged these results. Electronically Signed   By: Guadlupe Spanish M.D.   On: 02/25/2021 14:39   VAS [place] LOWER EXTREMITY VENOUS (DVT)  Result Date: 02/26/2021  Lower Venous DVT Study Patient Name:  TANDY GRAWE  Date of Exam:   02/26/2021 Medical Rec #: [id number]      Accession #:    [id number] Date of Birth: 24-Mar-1946     Patient Gender: F Patient Age:   3 years Exam Location:  [facility] Procedure:      VAS [place] LOWER EXTREMITY VENOUS (DVT) Referring Phys: Scheryl Marten Jakirah Zaun  --------------------------------------------------------------------------------  Indications: Stroke.  Comparison Study: No prior study Performing Technologist: Gertie Fey MHA, RDMS, RVT, RDCS  Examination Guidelines: A complete evaluation includes B-mode imaging, spectral Doppler, color Doppler, and power Doppler as needed of all accessible portions of each vessel. Bilateral testing is considered an integral part of a complete examination. Limited examinations for reoccurring indications may be performed as noted. The reflux portion of the exam is performed with the patient in reverse Trendelenburg.  +---------+---------------+---------+-----------+----------+--------------+ RIGHT    CompressibilityPhasicitySpontaneityPropertiesThrombus Aging +---------+---------------+---------+-----------+----------+--------------+ CFV      Full           Yes      Yes                                 +---------+---------------+---------+-----------+----------+--------------+  SFJ      Full                                                        +---------+---------------+---------+-----------+----------+--------------+ FV Prox  Full                                                        +---------+---------------+---------+-----------+----------+--------------+ FV Mid   Full                                                        +---------+---------------+---------+-----------+----------+--------------+ FV DistalFull                                                        +---------+---------------+---------+-----------+----------+--------------+ PFV      Full                                                        +---------+---------------+---------+-----------+----------+--------------+ POP      Full           Yes      Yes                                 +---------+---------------+---------+-----------+----------+--------------+ PTV      Full                                                         +---------+---------------+---------+-----------+----------+--------------+ PERO     Full                                                        +---------+---------------+---------+-----------+----------+--------------+   +---------+---------------+---------+-----------+----------+--------------+ LEFT     CompressibilityPhasicitySpontaneityPropertiesThrombus Aging +---------+---------------+---------+-----------+----------+--------------+ CFV      Full           Yes      Yes                                 +---------+---------------+---------+-----------+----------+--------------+ SFJ      Full                                                        +---------+---------------+---------+-----------+----------+--------------+  FV Prox  Full                                                        +---------+---------------+---------+-----------+----------+--------------+ FV Mid   Full                                                        +---------+---------------+---------+-----------+----------+--------------+ FV DistalFull                                                        +---------+---------------+---------+-----------+----------+--------------+ PFV      Full                                                        +---------+---------------+---------+-----------+----------+--------------+ POP      Full           Yes      Yes                                 +---------+---------------+---------+-----------+----------+--------------+ PTV      Full                                                        +---------+---------------+---------+-----------+----------+--------------+ PERO     Full                                                        +---------+---------------+---------+-----------+----------+--------------+     Summary: RIGHT: - There is no evidence of deep vein thrombosis in the lower  extremity.  - No cystic structure found in the popliteal fossa.  LEFT: - There is no evidence of deep vein thrombosis in the lower extremity.  - No cystic structure found in the popliteal fossa.  *See table(s) above for measurements and observations. Electronically signed by Waverly Ferrari MD on 02/26/2021 at 4:59:49 PM.    Final    CT ANGIO HEAD NECK W WO CM W PERF (CODE STROKE)  Result Date: 02/25/2021 CLINICAL DATA:  Neuro deficit, acute, stroke suspected EXAM: CT ANGIOGRAPHY HEAD AND NECK CT PERFUSION BRAIN TECHNIQUE: Multidetector CT imaging of the head and neck was performed using the standard protocol during bolus administration of intravenous contrast. Multiplanar CT image reconstructions and MIPs were obtained to evaluate the vascular anatomy. Carotid stenosis measurements (when applicable) are obtained utilizing NASCET criteria, using the distal internal carotid diameter as the denominator. Multiphase CT imaging of the brain was performed following IV  bolus contrast injection. Subsequent parametric perfusion maps were calculated using RAPID software. CONTRAST:  OMNIPAQUE IOHEXOL 350 MG/ML SOLN COMPARISON:  None. FINDINGS: CTA NECK Aortic arch: Trace calcified plaque along the included arch. Great vessel origins are patent. Right carotid system: Patent.  No stenosis. Left carotid system: Patent. Trace mixed plaque at the bifurcation. No stenosis. Vertebral arteries: Patent and codominant.  No stenosis. Skeleton: Mild cervical spine degenerative changes. Degenerative changes at the temporomandibular joints. Other neck: Unremarkable. Upper chest: Centrilobular emphysema. Nodular but somewhat tubular opacities at the left apex probably reflect impacted airways. Review of the MIP images confirms the above findings CTA HEAD Anterior circulation: Intracranial internal carotid arteries are patent with mild calcified plaque. Left M1 MCA is patent. There is occlusion of a distal left M2 MCA branch  within the posterior sylvian fissure. This may reconstitute more distally. Anterior and right middle cerebral arteries are patent. Posterior circulation: Intracranial vertebral arteries are patent. Basilar artery is patent. Major cerebellar artery origins are patent. A left posterior communicating artery is present. Posterior cerebral arteries are patent. Venous sinuses: Patent as allowed by contrast bolus timing. Review of the MIP images confirms the above findings CT Brain Perfusion Findings: CBF (<30%) Volume: 3mL Perfusion (Tmax>6.0s) volume: 21mL Mismatch Volume: 18mL Infarction Location: Area of infarction and most territory at risk within the posterior left MCA territory. There is some artifactual calculated territory at risk within the inferior frontal lobes. 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 May reconstitute more distally. Perfusion imaging demonstrates 3 mL of core infarction and 18 mL of penumbra within the posterior left MCA territory. The penumbra volume is overestimated as there is some artifact in the inferior frontal lobes included in the number. No occlusion or hemodynamically significant stenosis in the neck. These results were provided by telephone at the time of interpretation on 02/25/2021 at 3:55 pm to provider [facility] , who verbally acknowledged these results. Electronically Signed   By: Guadlupe Spanish M.D.   On: 02/25/2021 16:01       HISTORY OF PRESENT ILLNESS Sarai January is an 75 y.o. female with a PMHx of COPD, HTN and HLD who presented to the [facility] this afternoon for evaluation of acute onset confusion. She was at her son's house at symptom onset. LKN was 1 PM. Teleneurology evaluated the patient and exam findings were most consistent with an aphasia. CT head showed no hemorrhage and there were no contraindications to TNK, which was administered at the [facility]. CTA revealed a left M2/M3 occlusion and decision was made to transfer to [facility]  for 4 vessel angiogram and possible thrombectormy. On arrival to the [facility] ED, the patient continued to be aphasic  HOSPITAL COURSE Ms. Satina Jerrell is a 75 y.o. female with history of HTN, HLD admitted for aphasia. S/p TNK and IR intervention with TICI2c flow and recocclusion. She was then found to be in atrial fibrillation, was cardioverted once unsuccessfully, given an amiodarone load and cardioverted again, this time successfully. Patient is now in NSR and ready for discharge to CIR.   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  MRI large inferior MCA infarct with petechial hemorrhage  2D Echo EF 55-60% LE venous doppler no DVT LDL 63 HgbA1c 5.5 UDS neg SCDs for VTE prophylaxis Eliquis started 10/23 ongoing aggressive stroke risk factor management   Afib RVR Cardiology following  Now in NSR New diagnosis TEE 10/21 EF 50-55%. No thrombus or vegetation.  S/p cardioversion 10/21- unfortunately unsuccessful. Repeat cardioversion 10/25 successful. She is in NSR. Eliquis started 03/03/2021   Hypertension Off cleviprex BP goal < 160  Long term BP goal normotensive Currently continuing midodrine 10 mg TID for hypotension   Hyperlipidemia Home meds:  lipitor 10  LDL 63, goal < 70 Now on lipitor 10 Continue statin at discharge   Other Stroke Risk Factors Advanced age Obesity, Body mass index is 31.15 kg/m.    Other Active Problems Cough with sputum production  -Leukocytosis WBC 11.3, afebrile overnight -CXR with no acute process.  - continued on doxycycline for 10 days , stop 11/1   COPD. Oxygen as needed and bronchodilators   DISCHARGE EXAM Blood pressure  118/70, pulse 64, temperature 98.2 F (36.8 C), temperature source Oral, resp. rate 20, height 5\' 7"  (1.702 m), weight 90.2 kg, SpO2 96 %. General: Patient is an alert, well-nourished female in no acute distress.  Neurological:  NEURO:  Mental Status: AA&Ox3  Speech/Language: Aphasia continues, receptive and expressive.  Patient often tries to read lips.  She is able to name a pen but not a key and cannot repeat phrases.   She can comprehend and follow simple commands but has difficulty with more complex commands.  Cranial Nerves:  II: PERRL.  III, IV, VI: EOMI. Eyelids elevate symmetrically.  V: Sensation is intact to light touch and symmetrical to face.  VII: Smile is symmetrical.  VIII: hearing intact to voice. IX, X: Phonation is normal.  XII: tongue is midline without fasciculations. Motor: 5/5 strength to all muscle groups tested.  Tone: is normal and bulk is normal Sensation- Intact to light touch bilaterally.  Coordination:No drift.  Gait- deferred    Discharge Diet      Diet   Diet Heart Room service appropriate? Yes; Fluid consistency: Thin   liquids  DISCHARGE PLAN Disposition:  Transfer to [facility] for ongoing PT, OT and ST Eliquis (apixaban) daily for secondary stroke prevention indefinitely Recommend ongoing stroke risk factor control by Primary Care Physician at time of discharge from inpatient rehabilitation. Follow-up PCP System, Provider Not In in 2 weeks following discharge from rehab. Follow-up in [facility] in 4 weeks following discharge from rehab, office to schedule an appointment.   35 minutes were spent preparing discharge.  Marvel Plan, MD PhD Stroke Neurology 03/08/2021 12:08 AM

## 2021-03-07 NOTE — H&P (Signed)
Physical Medicine and Rehabilitation Admission H&P        Chief Complaint  Patient presents with   Code Stroke  : HPI: Michelle Dean is a 75 year old right-handed female with history of COPD with intermittent oxygen at home, hypertension and hyperlipidemia.  Per chart review lives alone.  Independent prior to admission.  1 level home one-step to entry.  Patient does have supportive family in the area.  Presented 02/25/2021 to the [facility] with expressive receptive aphasia.  CT of the head showed no acute intracranial hemorrhage or evidence of acute infarction.  Age-indeterminate small vessel infarcts of the left ganglial capsular region.  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  Patient was administered TNK.  CTA revealed a left M2/M3 occlusion patient was transferred to [facility] for four-vessel angiogram and underwent partial recanalization with reocclusion followed by mechanical thrombolysis achieving a TICI revascularization.  Hospital course 10/19 developed atrial fibrillation RVR started on Cardizem however RVR persisted with cardiology service follow-up underwent cardioversion 10/21 unfortunately back into A. fib and again with cardioversion 03/05/2021 sustaining NSR.  TEE  ejection fraction 50-55% no thrombus or vegetation.  Patient currently is maintained on Eliquis.  Close monitoring of blood pressure maintained on ProAmatine.  Patient did have some leukocytosis 23,600 improved to 14,000 and latest chest x-ray showed no signs of pneumonia she is currently completing a course of doxycycline.  Tolerating a regular consistency diet.  Therapy evaluations completed due to patient decreased functional ability and aphasia was admitted for a comprehensive rehab program.   Pt just walked 30-50 ft with Acute therapy- RW min A Needs max cues for safety, etc- is also HOH.      Review of Systems  Constitutional:  Negative for chills and fever.  HENT:  Negative for hearing  loss.   Eyes:  Negative for blurred vision and double vision.  Respiratory:  Negative for cough and shortness of breath.   Cardiovascular:  Positive for palpitations and leg swelling. Negative for chest pain.  Gastrointestinal:  Positive for constipation. Negative for heartburn, nausea and vomiting.  Genitourinary:  Negative for dysuria, flank pain and hematuria.  Musculoskeletal:  Positive for joint pain and myalgias.  Skin:  Negative for rash.  Neurological:  Positive for speech change.  All other systems reviewed and are negative. History reviewed. No pertinent past medical history.      Past Surgical History:  Procedure Laterality Date   BUBBLE STUDY   03/01/2021    Procedure: BUBBLE STUDY;  Surgeon: Tessa Lerner, DO;  Location: [facility]OS[facility];  Service: Cardiovascular;;   CARDIOVERSION N/A 03/01/2021    Procedure: CARDIOVERSION;  Surgeon: Tessa Lerner, DO;  Location: [facility]OS[facility];  Service: Cardiovascular;  Laterality: N/A;   CARDIOVERSION N/A 03/05/2021    Procedure: CARDIOVERSION;  Surgeon: Yates Decamp, MD;  Location: [facility] ENDOSCOPY;  Service: Cardiovascular;  Laterality: N/A;   IR CT HEAD LTD   02/25/2021   IR PERCUTANEOUS ART THROMBECTOMY/INFUSION INTRACRANIAL INC DIAG ANGIO   02/25/2021   RADIOLOGY WITH ANESTHESIA N/A 02/25/2021    Procedure: IR WITH ANESTHESIA;  Surgeon: Radiologist, Medication, MD;  Location: [facility] OR;  Service: Radiology;  Laterality: N/A;   TEE WITHOUT CARDIOVERSION N/A 03/01/2021    Procedure: TRANSESOPHAGEAL ECHOCARDIOGRAM (TEE);  Surgeon: Tessa Lerner, DO;  Location: [facility] ENDOSCOPY;  Service: Cardiovascular;  Laterality: N/A;    History reviewed. No pertinent family history. Social History:  has no history on file for tobacco use, alcohol use, and drug use. Allergies:  Allergies  Allergen Reactions   Aspirin        Other reaction(s): Bleeding   Penicillins Hives and Rash   Lisinopril Cough          Medications Prior to Admission  Medication Sig  Dispense Refill   albuterol (PROVENTIL) (2.5 MG/3ML) 0.083% nebulizer solution Take 2.5 mg by nebulization every 4 (four) hours as needed for wheezing.       atorvastatin (LIPITOR) 10 MG tablet Take 10 mg by mouth daily.       carboxymethylcellulose (REFRESH PLUS) 0.5 % SOLN Place 1 drop into both eyes 4 (four) times daily as needed (dry eyes).       DULERA 100-5 MCG/ACT AERO Inhale 2 puffs into the lungs 2 (two) times daily.       losartan (COZAAR) 25 MG tablet Take 25 mg by mouth daily.       Multiple Vitamin (MULTIVITAMIN) tablet Take 1 tablet by mouth daily.       omeprazole (PRILOSEC) 20 MG capsule Take 20 mg by mouth daily.       tiotropium (SPIRIVA HANDIHALER) 18 MCG inhalation capsule Place 18 mcg into inhaler and inhale daily.          Drug Regimen Review Drug regimen was reviewed and remains appropriate with no significant issues identified   Home: Home Living Family/patient expects to be discharged to:: Private residence Living Arrangements: Alone Available Help at Discharge: Family, Available 24 hours/day Type of Home: House Home Access: Level entry Entrance Stairs-Number of Steps: 1 Entrance Stairs-Rails: Right Home Layout: One level Bathroom Shower/Tub: Associate Professor: Yes Home Equipment: None  Lives With: Alone   Functional History: Prior Function (Read Only) Level of Independence: Independent   Functional Status:  Mobility: Bed Mobility Overal bed mobility: Needs Assistance Bed Mobility: Supine to Sit Supine to sit: Supervision General bed mobility comments: in chair Transfers Overall transfer level: Needs assistance Equipment used: Rolling walker (2 wheels) Transfers: Sit to/from Stand Sit to Stand: Supervision General transfer comment: For safety, no physical assist needed, pt picking RW up off of floor during turns Ambulation/Gait Ambulation/Gait assistance: Min assist Gait Distance (Feet): 30 Feet  (15 ft, rest break in bathroom) Assistive device: Rolling walker (2 wheels) Gait Pattern/deviations: Step-through pattern, Decreased stride length, Drifts right/left General Gait Details: Pt responds better to visual cues for directions using RW, DOE 2/4 Gait velocity: decreased Gait velocity interpretation: <1.8 ft/sec, indicate of risk for recurrent falls   ADL: ADL Overall ADL's : Needs assistance/impaired Eating/Feeding: Set up Grooming: Set up, Standing Grooming Details (indicate cue type and reason): with mod cues to sequence and min guard balance. Upper Body Bathing: Min guard, Standing Lower Body Bathing: Minimal assistance, Sit to/from stand Upper Body Dressing : Min guard, Sitting Lower Body Dressing: Minimal assistance, Sitting/lateral leans Toilet Transfer: Supervision/safety, Rolling walker (2 wheels) Toilet Transfer Details (indicate cue type and reason): supervision for safety - pt with poor management of RW Toileting- Clothing Manipulation and Hygiene: Supervision/safety Toileting - Clothing Manipulation Details (indicate cue type and reason): for safety Functional mobility during ADLs: Min guard General ADL Comments: pt demonstrates increased ability to engage in adl task and with increased therapy coudl progress to higher level   Cognition: Cognition Overall Cognitive Status: Impaired/Different from baseline Arousal/Alertness: Awake/alert Orientation Level: Disoriented to place, Disoriented X4 Attention: Focused Focused Attention: Impaired Memory:  (very challenging to assess will require additional clinical interaction) Awareness: Impaired Problem Solving: Impaired Executive Function: Organizing, Self  Monitoring Organizing: Impaired Safety/Judgment: Impaired Cognition Arousal/Alertness: Awake/alert Behavior During Therapy: WFL for tasks assessed/performed Overall Cognitive Status: Impaired/Different from baseline Area of Impairment: Safety/judgement,  Awareness, Problem solving, Following commands Following Commands: Follows one step commands inconsistently Safety/Judgement: Decreased awareness of safety, Decreased awareness of deficits Awareness: Emergent Problem Solving: Slow processing, Decreased initiation, Difficulty sequencing (Does well with physical gestures) General Comments: continues to have receptive difficulties, unable to state her last name without hints. Improved management of RW but still limited awareness of environmetnal obstacles Difficult to assess due to: Impaired communication   Physical Exam: Blood pressure 110/76, pulse 62, temperature 98.2 F (36.8 C), temperature source Oral, resp. rate 20, height 5\' 7"  (1.702 m), weight 90.2 kg, SpO2 96 %. Physical Exam Vitals and nursing note reviewed.  Constitutional:      Appearance: She is obese.     Comments: Pt awake, alert, appears stated age; sitting in bedside chair; wearing 2L O2 by [place], NAD  HENT:     Head: Normocephalic and atraumatic.     Comments: Smile equal; tongue midline; facial sensation intact    Right Ear: External ear normal.     Left Ear: External ear normal.     Nose: Nose normal. No congestion.     Mouth/Throat:     Mouth: Mucous membranes are dry.     Pharynx: Oropharynx is clear. No oropharyngeal exudate.  Eyes:     General:        Right eye: No discharge.        Left eye: No discharge.     Extraocular Movements: Extraocular movements intact.  Cardiovascular:     Rate and Rhythm: Normal rate and regular rhythm.     Heart sounds: Normal heart sounds. No murmur heard.   No gallop.  Pulmonary:     Comments: Coughing intermittently- sounds a little tight; little coarse; but no W/R/R Abdominal:     Comments: Soft, NT, ND, (+)BS    Musculoskeletal:     Cervical back: Normal range of motion. No rigidity.     Comments: R side 5-/5 in Biceps, triceps, WE, grip and finger abd; as well as HF, KE, DF and PF L side 5/5 in same muscles  Skin:     Comments: Intact- warm dry and R wrist IV- looks OK  Neurological:     Mental Status: She is alert.     Comments: Patient is alert makes eye contact with examiner.  Aphasic receptive greater than expressive.  She did not follow commands during exam.   Global aphasia- but with repeated gestures and her seeing my mouth move, was able to figure out most of exam, but took multiple times.  Intact to light touch x 4 extremities per pt. Not sure if correct  Psychiatric:     Comments: Flat affect      Lab Results Last 48 Hours        Results for orders placed or performed during the hospital encounter of 02/25/21 (from the past 48 hour(s))  CBC     Status: Abnormal    Collection Time: 03/06/21 10:12 AM  Result Value Ref Range    WBC 12.1 (H) 4.0 - 10.5 K/uL    RBC 3.69 (L) 3.87 - 5.11 MIL/uL    Hemoglobin 11.1 (L) 12.0 - 15.0 g/dL    HCT 03/08/21 (L) 00.9 - 46.0 %    MCV 95.1 80.0 - 100.0 fL    MCH 30.1 26.0 - 34.0 pg    MCHC  31.6 30.0 - 36.0 g/dL    RDW 05.3 97.6 - 73.4 %    Platelets 307 150 - 400 K/uL    nRBC 0.0 0.0 - 0.2 %      Comment: Performed at [facility], [address], [place], [place] [address]  Basic metabolic panel     Status: Abnormal    Collection Time: 03/06/21 10:12 AM  Result Value Ref Range    Sodium 139 135 - 145 mmol/L    Potassium 4.0 3.5 - 5.1 mmol/L    Chloride 95 (L) 98 - 111 mmol/L    CO2 33 (H) 22 - 32 mmol/L    Glucose, Bld 90 70 - 99 mg/dL      Comment: Glucose reference range applies only to samples taken after fasting for at least 8 hours.    BUN 21 8 - 23 mg/dL    Creatinine, Ser 0.24 (H) 0.44 - 1.00 mg/dL    Calcium 8.8 (L) 8.9 - 10.3 mg/dL    GFR, Estimated 48 (L) >60 mL/min      Comment: (NOTE) Calculated using the CKD-EPI Creatinine Equation (2021)      Anion gap 11 5 - 15      Comment: Performed at [facility], [address] [place], [place] [address]  Basic metabolic panel     Status: Abnormal    Collection Time: 03/07/21   2:50 AM  Result Value Ref Range    Sodium 138 135 - 145 mmol/L    Potassium 3.7 3.5 - 5.1 mmol/L    Chloride 96 (L) 98 - 111 mmol/L    CO2 34 (H) 22 - 32 mmol/L    Glucose, Bld 90 70 - 99 mg/dL      Comment: Glucose reference range applies only to samples taken after fasting for at least 8 hours.    BUN 24 (H) 8 - 23 mg/dL    Creatinine, Ser 3.29 (H) 0.44 - 1.00 mg/dL    Calcium 9.0 8.9 - 92.4 mg/dL    GFR, Estimated 49 (L) >60 mL/min      Comment: (NOTE) Calculated using the CKD-EPI Creatinine Equation (2021)      Anion gap 8 5 - 15      Comment: Performed at [facility], [address] [place], [place] [address]  CBC     Status: Abnormal    Collection Time: 03/07/21  2:50 AM  Result Value Ref Range    WBC 11.3 (H) 4.0 - 10.5 K/uL    RBC 3.69 (L) 3.87 - 5.11 MIL/uL    Hemoglobin 11.2 (L) 12.0 - 15.0 g/dL    HCT 19.6 (L) 22.2 - 46.0 %    MCV 94.3 80.0 - 100.0 fL    MCH 30.4 26.0 - 34.0 pg    MCHC 32.2 30.0 - 36.0 g/dL    RDW 97.9 89.2 - 11.9 %    Platelets 290 150 - 400 K/uL    nRBC 0.0 0.0 - 0.2 %      Comment: Performed at [facility], [address], [place], [place] [address]  Brain natriuretic peptide     Status: Abnormal    Collection Time: 03/07/21  2:50 AM  Result Value Ref Range    B Natriuretic Peptide 126.4 (H) 0.0 - 100.0 pg/mL      Comment: Performed at [facility], [address] [place], [place] [address]      Imaging Results (  Last 48 hours)  No results found.           Medical Problem List and Plan: 1.   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  Antithrombotics: -DVT/anticoagulation:  Pharmaceutical: Other (comment) Eliquis             -antiplatelet therapy: N/A 3. Pain Management: Tylenol as needed 4. Mood: Provide emotional support              -antipsychotic agents: N/A 5. Neuropsych: This patient is not capable of making decisions on her own behalf. 6. Skin/Wound Care: Routine skin checks 7. Fluids/Electrolytes/Nutrition: Routine in and outs with follow-up chemistries 8.  Atrial fibrillation with RVR.  Status post cardioversion.  Follow-up cardiology services.  Amiodarone 200 mg  daily, verapamil 40 mg every 8 hours. 9.  Hypotension.  Currently on ProAmatine 10 mg 3 times daily. 10.  Hyperlipidemia.  Lipitor 11.  History of COPD.  Monitor oxygen saturations.  Continue nebulizers 12.  Leukocytosis.  Chest x-ray negative.  Completed course of doxycycline     I have personally performed a face to face diagnostic evaluation of this patient and formulated the key components of the plan.  Additionally, I have personally reviewed laboratory data, imaging studies, as well as relevant notes and concur with the physician assistant's documentation above.   The patient's status has not changed from the original H&P.  Any changes in documentation from the acute care chart have been noted above.     Mcarthur Rossetti Angiulli, PA-C 03/07/2021

## 2021-03-08 DIAGNOSIS — I63512 Cerebral infarction due to unspecified occlusion or stenosis of left middle cerebral artery: Secondary | ICD-10-CM | POA: Diagnosis not present

## 2021-03-08 LAB — CBC WITH DIFFERENTIAL/PLATELET
Abs Immature Granulocytes: 0.11 10*3/uL — ABNORMAL HIGH (ref 0.00–0.07)
Basophils Absolute: 0.1 10*3/uL (ref 0.0–0.1)
Basophils Relative: 1 %
Eosinophils Absolute: 0.2 10*3/uL (ref 0.0–0.5)
Eosinophils Relative: 2 %
HCT: 34.7 % — ABNORMAL LOW (ref 36.0–46.0)
Hemoglobin: 11.1 g/dL — ABNORMAL LOW (ref 12.0–15.0)
Immature Granulocytes: 1 %
Lymphocytes Relative: 23 %
Lymphs Abs: 2.5 10*3/uL (ref 0.7–4.0)
MCH: 30.4 pg (ref 26.0–34.0)
MCHC: 32 g/dL (ref 30.0–36.0)
MCV: 95.1 fL (ref 80.0–100.0)
Monocytes Absolute: 1.7 10*3/uL — ABNORMAL HIGH (ref 0.1–1.0)
Monocytes Relative: 15 %
Neutro Abs: 6.5 10*3/uL (ref 1.7–7.7)
Neutrophils Relative %: 58 %
Platelets: 319 10*3/uL (ref 150–400)
RBC: 3.65 MIL/uL — ABNORMAL LOW (ref 3.87–5.11)
RDW: 13.3 % (ref 11.5–15.5)
WBC: 11 10*3/uL — ABNORMAL HIGH (ref 4.0–10.5)
nRBC: 0 % (ref 0.0–0.2)

## 2021-03-08 LAB — COMPREHENSIVE METABOLIC PANEL
ALT: 13 U/L (ref 0–44)
AST: 18 U/L (ref 15–41)
Albumin: 2.8 g/dL — ABNORMAL LOW (ref 3.5–5.0)
Alkaline Phosphatase: 48 U/L (ref 38–126)
Anion gap: 9 (ref 5–15)
BUN: 23 mg/dL (ref 8–23)
CO2: 32 mmol/L (ref 22–32)
Calcium: 8.9 mg/dL (ref 8.9–10.3)
Chloride: 97 mmol/L — ABNORMAL LOW (ref 98–111)
Creatinine, Ser: 1.07 mg/dL — ABNORMAL HIGH (ref 0.44–1.00)
GFR, Estimated: 54 mL/min — ABNORMAL LOW (ref >60)
Glucose, Bld: 93 mg/dL (ref 70–99)
Potassium: 3.9 mmol/L (ref 3.5–5.1)
Sodium: 138 mmol/L (ref 135–145)
Total Bilirubin: 1 mg/dL (ref 0.3–1.2)
Total Protein: 5.9 g/dL — ABNORMAL LOW (ref 6.5–8.1)

## 2021-03-08 NOTE — Plan of Care (Signed)
  Problem: RH Balance Goal: LTG Patient will maintain dynamic standing with ADLs (OT) Description: LTG:  Patient will maintain dynamic standing balance with assist during activities of daily living (OT)  Flowsheets (Taken 03/08/2021 1610) LTG: Pt will maintain dynamic standing balance during ADLs with: Supervision/Verbal cueing   Problem: Sit to Stand Goal: LTG:  Patient will perform sit to stand in prep for activites of daily living with assistance level (OT) Description: LTG:  Patient will perform sit to stand in prep for activites of daily living with assistance level (OT) Flowsheets (Taken 03/08/2021 1610) LTG: PT will perform sit to stand in prep for activites of daily living with assistance level: Supervision/Verbal cueing   Problem: RH Grooming Goal: LTG Patient will perform grooming w/assist,cues/equip (OT) Description: LTG: Patient will perform grooming with assist, with/without cues using equipment (OT) Flowsheets (Taken 03/08/2021 1610) LTG: Pt will perform grooming with assistance level of: Supervision/Verbal cueing   Problem: RH Bathing Goal: LTG Patient will bathe all body parts with assist levels (OT) Description: LTG: Patient will bathe all body parts with assist levels (OT) Flowsheets (Taken 03/08/2021 1610) LTG: Pt will perform bathing with assistance level/cueing: Supervision/Verbal cueing   Problem: RH Dressing Goal: LTG Patient will perform upper body dressing (OT) Description: LTG Patient will perform upper body dressing with assist, with/without cues (OT). Flowsheets (Taken 03/08/2021 1610) LTG: Pt will perform upper body dressing with assistance level of: Set up assist Goal: LTG Patient will perform lower body dressing w/assist (OT) Description: LTG: Patient will perform lower body dressing with assist, with/without cues in positioning using equipment (OT) Flowsheets (Taken 03/08/2021 1610) LTG: Pt will perform lower body dressing with assistance level of:  Supervision/Verbal cueing   Problem: RH Toileting Goal: LTG Patient will perform toileting task (3/3 steps) with assistance level (OT) Description: LTG: Patient will perform toileting task (3/3 steps) with assistance level (OT)  Flowsheets (Taken 03/08/2021 1610) LTG: Pt will perform toileting task (3/3 steps) with assistance level: Supervision/Verbal cueing   Problem: RH Toilet Transfers Goal: LTG Patient will perform toilet transfers w/assist (OT) Description: LTG: Patient will perform toilet transfers with assist, with/without cues using equipment (OT) Flowsheets (Taken 03/08/2021 1610) LTG: Pt will perform toilet transfers with assistance level of: Supervision/Verbal cueing   Problem: RH Tub/Shower Transfers Goal: LTG Patient will perform tub/shower transfers w/assist (OT) Description: LTG: Patient will perform tub/shower transfers with assist, with/without cues using equipment (OT) Flowsheets (Taken 03/08/2021 1610) LTG: Pt will perform tub/shower stall transfers with assistance level of: Supervision/Verbal cueing

## 2021-03-08 NOTE — Progress Notes (Signed)
Inpatient Rehabilitation Center Individual Statement of Services  Patient Name:  Michelle Dean  Date:  03/08/2021  Welcome to the Inpatient Rehabilitation Center.  Our goal is to provide you with an individualized program based on your diagnosis and situation, designed to meet your specific needs.  With this comprehensive rehabilitation program, you will be expected to participate in at least 3 hours of rehabilitation therapies Monday-Friday, with modified therapy programming on the weekends.  Your rehabilitation program will include the following services:  Physical Therapy (PT), Occupational Therapy (OT), Speech Therapy (ST), 24 hour per day rehabilitation nursing, Therapeutic Recreaction (TR), Care Coordinator, Rehabilitation Medicine, Nutrition Services, and Pharmacy Services  Weekly team conferences will be held on Wednesday to discuss your progress.  Your Inpatient Rehabilitation Care Coordinator will talk with you frequently to get your input and to update you on team discussions.  Team conferences with you and your family in attendance may also be held.  Expected length of stay: 12-14 days  Overall anticipated outcome: supervision with cueing  Depending on your progress and recovery, your program may change. Your Inpatient Rehabilitation Care Coordinator will coordinate services and will keep you informed of any changes. Your Inpatient Rehabilitation Care Coordinator's name and contact numbers are listed  below.  The following services may also be recommended but are not provided by the Inpatient Rehabilitation Center:  Driving Evaluations Home Health Rehabiltiation Services Outpatient Rehabilitation Services    Arrangements will be made to provide these services after discharge if needed.  Arrangements include referral to agencies that provide these services.  Your insurance has been verified to be:  [employer]-medicare Your primary doctor is:  Sydnee Cabal  Pertinent information will be  shared with your doctor and your insurance company.  Inpatient Rehabilitation Care Coordinator:  Lavera Guise, [place] [phone] or [phone]  Information discussed with and copy given to patient by: Lucy Chris, 03/08/2021, 12:06 PM

## 2021-03-08 NOTE — Progress Notes (Addendum)
Signed                                                                                                                                                                                                                                                                                                                                                                                                                      PMR Admission Coordinator Pre-Admission Assessment   Patient: Michelle Dean is an 75 y.o., female MRN: [id number] DOB: 1945-08-30 Height: '5\' 7"'  (170.2 cm) Weight: 90.2 kg   Insurance Information HMO:     PPO:      PCP:      IPA:      80/20:      OTHER:  PRIMARY: U[employer] Medicare      Policy#: [id number]      Subscriber: patient CM Name:   [name]    Phone#:    [phone] option 7  Fax#: [phone] Pre-Cert#: [id number] approved for 7 days      Employer:  Benefits:  Phone #: [phone]     Name: 10/17 Eff. Date: 05/12/2020     Deduct: none      Out of Pocket Max: $3600      Life Max: none CIR: $295 co pay per day days 1 until 5      SNF: no copay days 1 until 20; $188 co pay per day days 21 until 40; no copay days 41 until; 100 Outpatient: $30 per visit  Co-Pay: visits per medical neccesity Home Health: 100%      Co-Pay: visits per medical neccesity DME: 80%     Co-Pay: 20% Providers: in-network  SECONDARY:       Policy#:      Phone#:    Development worker, community:       Phone#:    The Engineer, petroleum" for patients in Inpatient Rehabilitation Facilities with attached "Privacy Act [place] Records" was provided and verbally reviewed with: Family   Emergency Contact Information Contact Information       Name Relation Home Work Mobile    [place] Son [phone] [place]    [name]      [phone]         Current Medical History  Patient Admitting Diagnosis: Left MCA stroke   History of Present Illness: 75 year old right-handed female with history of COPD with intermittent oxygen at home, hypertension and hyperlipidemia.   Presented 02/25/2021 to the [facility] with expressive receptive aphasia.  CT of the head showed no acute intracranial hemorrhage or evidence of acute infarction.  Age-indeterminate small vessel infarcts of the left ganglial capsular region.  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  Patient was administered TNK.  CTA revealed a left M2/M3 occlusion patient was transferred to [facility] for four-vessel angiogram and underwent partial recanalization with reocclusion followed by mechanical thrombolysis achieving a TICI revascularization.  Hospital course 10/19 developed atrial fibrillation RVR started on Cardizem however RVR persisted with cardiology service follow-up underwent cardioversion 10/21 unfortunately back into A. fib and again with cardioversion 03/05/2021 sustaining NSR.  TEE  ejection fraction 50-55% no thrombus or vegetation.  Patient currently is maintained on Eliquis.  Close monitoring of blood pressure maintained on ProAmatine.  Patient did have some leukocytosis 23,600 improved to 14,000 and latest chest x-ray showed no signs of pneumonia she is currently completing a course of doxycycline.  Tolerating a regular consistency diet . Complete NIHSS TOTAL: 4   Patient's medical record from [facility] has been reviewed by the rehabilitation admission coordinator and physician.   Past Medical History  History reviewed. No pertinent past medical history.   Has the patient had major surgery during 100 days prior to admission? Yes   Family History   family history is not on file.   Current Medications   Current Facility-Administered Medications:    0.9 %  sodium chloride infusion, 250 mL, Intravenous, Continuous, Adrian Prows, MD, Stopped at 03/02/21 1000   acetaminophen (TYLENOL) tablet 650 mg, 650 mg, Oral, Q4H PRN, 650 mg at 03/01/21 2341 **OR** acetaminophen (TYLENOL) 160 MG/5ML solution 650 mg, 650 mg, Per Tube, Q4H PRN **OR** acetaminophen (TYLENOL) suppository 650 mg, 650 mg, Rectal, Q4H PRN, Adrian Prows, MD   albuterol (PROVENTIL) (2.5 MG/3ML) 0.083% nebulizer solution 2.5 mg, 2.5 mg, Nebulization, Q4H PRN, Adrian Prows, MD, 2.5 mg at 03/05/21 2230   amiodarone (PACERONE) tablet 200 mg, 200 mg, Oral, Daily, Tolia, Sunit, DO, 200 mg at 03/07/21 1259   apixaban (ELIQUIS) tablet 5 mg, 5 mg, Oral, BID, Adrian Prows, MD, 5 mg at 03/07/21 1121   atorvastatin (LIPITOR) tablet 10 mg, 10 mg, Oral, Daily, Adrian Prows, MD, 10 mg at 03/07/21 1122   Chlorhexidine Gluconate Cloth 2 % PADS 6 each, 6 each, Topical, Daily, Adrian Prows, MD, 6 each at 03/06/21 0857   labetalol (NORMODYNE) injection 10 mg, 10 mg, Intravenous, Q10 min PRN, 10 mg at 02/25/21 1616 **AND** [DISCONTINUED] clevidipine (CLEVIPREX) infusion 0.5 mg/mL,  0-21 mg/hr, Intravenous, Continuous, Bhagat, Srishti L, MD, Stopped at 02/26/21 0944   magnesium oxide (MAG-OX) tablet 400 mg, 400 mg, Oral, Daily, Adrian Prows, MD, 400 mg at 03/07/21 1121   midodrine (PROAMATINE) tablet 5 mg, 5 mg, Oral, TID WC, Tolia, Sunit, DO, 5 mg at 03/07/21 1259   mometasone-formoterol (DULERA) 100-5 MCG/ACT inhaler 2 puff, 2 puff, Inhalation, BID, Adrian Prows, MD, 2 puff at 03/06/21 1951   multivitamin with minerals tablet 1 tablet, 1 tablet, Oral, Daily, Adrian Prows, MD, 1 tablet at 03/07/21 1121   senna-docusate (Senokot-S) tablet 1 tablet, 1 tablet, Oral, QHS PRN, Adrian Prows, MD   umeclidinium bromide (INCRUSE ELLIPTA) 62.5 MCG/INH 1 puff, 1 puff, Inhalation, Daily, Adrian Prows, MD, 1 puff at 03/04/21 0808   verapamil (CALAN) tablet 40 mg, 40 mg, Oral, Q8H, Adrian Prows, MD, 40 mg at 03/07/21 1300   Patients Current Diet:  Diet Order                  Diet Heart Room service appropriate?  Yes; Fluid consistency: Thin  Diet effective now                  Precautions / Restrictions Precautions Precautions: Fall Precaution Comments: receptive aphasia, [facility] Restrictions Weight Bearing Restrictions: No    Has the patient had 2 or more falls or a fall with injury in the past year? No   Prior Activity Level Limited Community (1-2x/wk): went places ~1-2 days/week   Prior Functional Level Self Care: Did the patient need help bathing, dressing, using the toilet or eating? Independent   Indoor Mobility: Did the patient need assistance with walking from room to room (with or without device)? Independent   Stairs: Did the patient need assistance with internal or external stairs (with or without device)? Independent   Functional Cognition: Did the patient need help planning regular tasks such as shopping or remembering to take medications? Independent   Patient Information  Patient unable to respond and Proxy provided info  Non Hispanic; White Does not need interpreter  Patient's Response To:   Patient not able to respond to health literacy and transportation needs; NO; Patient unable to respond Proxy states In the past 6 months has NOT had lack of transportation to medical appointments, getting medications or for non medical meetings, appointment, work or from getting things she needs  Development worker, international aid / Equipment Home Equipment: None   Prior Device Use: Indicate devices/aids used by the patient prior to current illness, exacerbation or injury? None of the above   Current Functional Level Cognition   Arousal/Alertness: Awake/alert Overall Cognitive Status: Impaired/Different from baseline Difficult to assess due to: Impaired communication Orientation Level: Disoriented to place, Disoriented X4 Following Commands: Follows one step commands inconsistently Safety/Judgement: Decreased awareness of safety, Decreased awareness of deficits General Comments:  continues to have receptive difficulties, unable to state her last name without hints. Improved management of RW but still limited awareness of environmetnal obstacles Attention: Focused Focused Attention: Impaired Memory:  (very challenging to assess will require additional clinical interaction) Awareness: Impaired Problem Solving: Impaired Executive Function: Organizing, Self Monitoring Organizing: Impaired Safety/Judgment: Impaired    Extremity Assessment (includes Sensation/Coordination)   Upper Extremity Assessment: Overall WFL for tasks assessed  Lower Extremity Assessment: Defer to PT evaluation     ADLs   Overall ADL's : Needs assistance/impaired Eating/Feeding: Set up Grooming: Set up, Standing Grooming Details (indicate cue type and reason): with mod cues to sequence and min guard  balance. Upper Body Bathing: Min guard, Standing Lower Body Bathing: Minimal assistance, Sit to/from stand Upper Body Dressing : Min guard, Sitting Lower Body Dressing: Minimal assistance, Sitting/lateral leans Toilet Transfer: Supervision/safety, Rolling walker (2 wheels) Toilet Transfer Details (indicate cue type and reason): supervision for safety - pt with poor management of RW Toileting- Clothing Manipulation and Hygiene: Supervision/safety Toileting - Clothing Manipulation Details (indicate cue type and reason): for safety Functional mobility during ADLs: Min guard General ADL Comments: pt demonstrates increased ability to engage in adl task and with increased therapy coudl progress to higher level     Mobility   Overal bed mobility: Needs Assistance Bed Mobility: Supine to Sit Supine to sit: Supervision General bed mobility comments: in chair     Transfers   Overall transfer level: Needs assistance Equipment used: Rolling walker (2 wheels) Transfers: Sit to/from Stand Sit to Stand: Supervision General transfer comment: For safety, no physical assist needed, pt picking RW up off of  floor during turns     Ambulation / Gait / Stairs / Wheelchair Mobility   Ambulation/Gait Ambulation/Gait assistance: Herbalist (Feet): 30 Feet (15 ft, rest break in bathroom) Assistive device: Rolling walker (2 wheels) Gait Pattern/deviations: Step-through pattern, Decreased stride length, Drifts right/left General Gait Details: Pt responds better to visual cues for directions using RW, DOE 2/4 Gait velocity: decreased Gait velocity interpretation: <1.8 ft/sec, indicate of risk for recurrent falls     Posture / Balance Dynamic Sitting Balance Sitting balance - Comments: able to sit EOB without assist Balance Overall balance assessment: Needs assistance Sitting-balance support: No upper extremity supported, Feet supported Sitting balance-Leahy Scale: Fair Sitting balance - Comments: able to sit EOB without assist Standing balance support: No upper extremity supported, During functional activity Standing balance-Leahy Scale: Fair Standing balance comment: needs at least U UE support for dynamic tasks     Special needs/care consideration      Previous Home Environment  Living Arrangements: Alone  Lives With: Alone Available Help at Discharge: Family, Available 24 hours/day Type of Home: House Home Layout: One level Home Access: Level entry Entrance Stairs-Rails: Right Entrance Stairs-Number of Steps: 1 Bathroom Shower/Tub: Chiropodist: Standard Bathroom Accessibility: Yes How Accessible: Accessible via walker [place]: No   Discharge Living Setting Plans for Discharge Living Setting: Patient's home Type of Home at Discharge: House Discharge Home Layout: One level Discharge Home Access: Level entry Discharge Bathroom Shower/Tub: [place] unit Discharge Bathroom Toilet: Standard Discharge Bathroom Accessibility: Yes How Accessible: Accessible via walker Does the patient have any problems obtaining your medications?: No    Social/Family/Support Systems Anticipated Caregiver: Francee Nodal and other family Anticipated Caregiver's Contact Information: [phone] Caregiver Availability: 24/7 Discharge Plan Discussed with Primary Caregiver: Yes Is Caregiver In Agreement with Plan?: Yes Does Caregiver/Family have Issues with Lodging/Transportation while Pt is in Rehab?: No   Goals Goals to improve gait and work on speaking and swallowing- mod I to supervision for PT/OT and min A for SLP   Decrease burden of Care through IP rehab admission: NA   Possible need for SNF placement upon discharge: Not anticipated   Patient Condition: I have reviewed medical records from [facility] , spoken with CM, and patient and son. I met with patient at the bedside for inpatient rehabilitation assessment.  Patient will benefit from ongoing PT, OT, and SLP, can actively participate in 3 hours of therapy a day 5 days of the week, and can make measurable gains during  the admission.  Patient will also benefit from the coordinated team approach during an Inpatient Acute Rehabilitation admission.  The patient will receive intensive therapy as well as Rehabilitation physician, nursing, social worker, and care management interventions.  Due to bladder management, bowel management, safety, skin/wound care, disese management, medication administration, pain management, and patient education the patient requires 24 hour a day rehabilitation nursing.  The patient is currently min to mod assist overall with mobility and basic ADLs.  Discharge setting and therapy post discharge at home with home health is anticipated.  Patient has agreed to participate in the Acute Inpatient Rehabilitation Program and will admit today.   Preadmission Screen Completed By:  Cleatrice Burke, 03/07/2021 1:34 PM ______________________________________________________________________   Discussed status with Dr. Dagoberto Ligas on 03/07/2021 at 1443 and received  approval for admission today.   Admission Coordinator:  Cleatrice Burke, RN, time  6837 Date 03/07/2021    Assessment/Plan: Diagnosis: L MCA stroke and aphasia and mild R hemi Does the need for close, 24 hr/day Medical supervision in concert with the patient's rehab needs make it unreasonable for this patient to be served in a less intensive setting? Yes Co-Morbidities requiring supervision/potential complications: COPD on Home O2; L MCA infarct, aphasia, HOH Due to bladder management, bowel management, safety, skin/wound care, disease management, medication administration, pain management, and patient education, does the patient require 24 hr/day rehab nursing? Yes Does the patient require coordinated care of a physician, rehab nurse, PT, OT, and SLP to address physical and functional deficits in the context of the above medical diagnosis(es)? Yes Addressing deficits in the following areas: balance, endurance, locomotion, strength, transferring, bathing, dressing, feeding, grooming, toileting, cognition, speech, language, and swallowing Can the patient actively participate in an intensive therapy program of at least 3 hrs of therapy 5 days a week? Yes The potential for patient to make measurable gains while on inpatient rehab is good Anticipated functional outcomes upon discharge from inpatient rehab: modified independent and supervision PT, modified independent and supervision OT, supervision and min assist SLP Estimated rehab length of stay to reach the above functional goals is: 14-16 days Anticipated discharge destination: Home 10. Overall Rehab/Functional Prognosis: good     MD Signature:           Revision History                               Note Details  Jan Fireman, MD File Time 03/07/2021  2:53 PM  Author Type Physician Status Signed  Last Editor Courtney Heys, MD Service Physical Medicine and [place] # [phone] Admit Date  03/07/2021

## 2021-03-08 NOTE — Progress Notes (Signed)
Inpatient Rehabilitation  Patient information reviewed and entered into eRehab system by Cela Newcom Donald Memoli, OTR/L.   Information including medical coding, functional ability and quality indicators will be reviewed and updated through discharge.    

## 2021-03-08 NOTE — Evaluation (Signed)
Physical Therapy Assessment and Plan  Patient Details  Name: Michelle Dean MRN: [id number] Date of Birth: 11/19/1945  PT Diagnosis: Difficulty walking, Hemiparesis dominant, Impaired cognition, and Muscle weakness Rehab Potential: Good ELOS: 12-14 days   Today's Date: 03/08/2021 PT Individual Time: 0803-0904 PT Individual Time Calculation (min): 61 min    Hospital Problem: Principal Problem:   Left middle cerebral artery stroke [facility])   Past Medical History: History reviewed. No pertinent past medical history. Past Surgical History:  Past Surgical History:  Procedure Laterality Date   BUBBLE STUDY  03/01/2021   Procedure: BUBBLE STUDY;  Surgeon: Rex Kras, DO;  Location: [place];  Service: Cardiovascular;;   CARDIOVERSION N/A 03/01/2021   Procedure: CARDIOVERSION;  Surgeon: Rex Kras, DO;  Location: [place] [facility];  Service: Cardiovascular;  Laterality: N/A;   CARDIOVERSION N/A 03/05/2021   Procedure: CARDIOVERSION;  Surgeon: Adrian Prows, MD;  Location: [place];  Service: Cardiovascular;  Laterality: N/A;   IR CT HEAD LTD  02/25/2021   IR PERCUTANEOUS ART THROMBECTOMY/INFUSION INTRACRANIAL INC DIAG ANGIO  02/25/2021   RADIOLOGY WITH ANESTHESIA N/A 02/25/2021   Procedure: IR WITH ANESTHESIA;  Surgeon: Radiologist, Medication, MD;  Location: [place];  Service: Radiology;  Laterality: N/A;   TEE WITHOUT CARDIOVERSION N/A 03/01/2021   Procedure: TRANSESOPHAGEAL ECHOCARDIOGRAM (TEE);  Surgeon: Rex Kras, DO;  Location: [facility];  Service: Cardiovascular;  Laterality: N/A;    Assessment & Plan Clinical Impression: Patient is a 75 y.o. right-handed female with history of COPD with intermittent oxygen at home, hypertension and hyperlipidemia.  Per chart review lives alone.  Independent prior to admission.  1 level home one-step to entry.  Patient does have supportive family in the area.  Presented 02/25/2021 to the [facility] with expressive receptive aphasia.  CT of the head  showed no acute intracranial hemorrhage or evidence of acute infarction.  Age-indeterminate small vessel infarcts of the left ganglial capsular region.  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  Patient was administered TNK.  CTA revealed a left M2/M3 occlusion patient was [facility] for four-vessel angiogram and underwent partial recanalization with reocclusion followed by mechanical thrombolysis achieving a TICI revascularization.  Hospital course 10/19 developed atrial fibrillation RVR started on Cardizem however RVR persisted with cardiology service follow-up underwent cardioversion 10/21 unfortunately back into A. fib and again with cardioversion 03/05/2021 sustaining NSR.  TEE  ejection fraction 50-55% no thrombus or vegetation.  Patient currently is maintained on Eliquis.  Close monitoring of blood pressure maintained on ProAmatine.  Patient did have some leukocytosis 23,600 improved to 14,000 and latest chest x-ray showed no signs of pneumonia she is currently completing a course of doxycycline.  Tolerating a regular consistency diet.  Therapy evaluations completed due to patient decreased functional ability and aphasia was admitted for a comprehensive rehab program.  Patient transferred to CIR on 03/07/2021 .   Patient currently requires min with mobility secondary to muscle weakness, decreased cardiorespiratoy endurance, unbalanced muscle activation, decreased attention to right, and decreased standing balance and decreased balance strategies.  Prior to hospitalization, patient was independent  with mobility and lived with Alone, Family in a House home.  Home access is 1 .  Patient will benefit from skilled PT intervention to maximize safe functional mobility, minimize fall risk, and decrease caregiver burden for planned discharge home with 24 hour supervision.  Anticipate patient will benefit from follow up OP at discharge.  PT - End of Session Activity  Tolerance: Tolerates 30+ min activity with  multiple rests Endurance Deficit: Yes Endurance Deficit Description: demos light dyspnea on exertion, low activity tolerance PT Assessment Rehab Potential (ACUTE/IP ONLY): Good PT Barriers to Discharge: [place] home environment;Decreased caregiver support;Home environment access/layout;Lack of/limited family support;Insurance for SNF coverage;Weight PT Patient demonstrates impairments in the following area(s): Balance;Endurance;Motor;Perception;Safety PT Transfers Functional Problem(s): Bed Mobility;Bed to Chair;Car;Furniture PT Locomotion Functional Problem(s): Ambulation;Wheelchair Mobility;Stairs PT Plan PT Intensity: Minimum of 1-2 x/day ,45 to 90 minutes PT Frequency: 5 out of 7 days PT Duration Estimated Length of Stay: 12-14 days PT Treatment/Interventions: Ambulation/gait training;Community reintegration;DME/adaptive equipment instruction;Neuromuscular re-education;Psychosocial support;Stair training;UE/LE Strength taining/ROM;Wheelchair propulsion/positioning;Balance/vestibular training;Discharge planning;Functional electrical stimulation;Pain management;Therapeutic Activities;UE/LE Coordination activities;Cognitive remediation/compensation;Disease management/prevention;Functional mobility training;Patient/family education;Splinting/orthotics;Therapeutic Exercise;Visual/perceptual remediation/compensation PT Transfers Anticipated Outcome(s): Mod I PT Locomotion Anticipated Outcome(s): Mod I/ supervision PT Recommendation Follow Up Recommendations: Home health PT;Outpatient PT (dependent on progress at time of d/c) Patient destination: Home Equipment Recommended: To be determined   PT Evaluation Precautions/Restrictions Precautions Precautions: Fall Precaution Comments: receptive>expressive aphasia, 2L O2 via [place] if SpO2 <94%, TEDs daily Restrictions Weight Bearing Restrictions: No General   Vital SignsTherapy Vitals Temp: 98 F  (36.7 C) Pulse Rate: 62 Resp: 18 BP: 138/72 Patient Position (if appropriate): Sitting Oxygen Therapy SpO2: 98 % O2 Device: Nasal Cannula O2 Flow Rate (L/min): 2 L/min Pain Pain Assessment Pain Scale: 0-10 Pain Score: 0-No pain Pain Interference Pain Interference Pain Effect on Sleep: 1. Rarely or not at all Pain Interference with Therapy Activities: 1. Rarely or not at all Pain Interference with Day-to-Day Activities: 1. Rarely or not at all Home Living/Prior [place] Available Help at Discharge: Family;Available 24 hours/day (son Marcelina Morel reports that he is providing full supervision) Type of Home: House Entrance Stairs-Number of Steps: 1 Home Layout: One level Bathroom Shower/Tub: Chiropodist: Standard Bathroom Accessibility: Yes  Lives With: Alone;Family Prior Function Level of Independence: Independent with homemaking with ambulation;Independent with basic ADLs;Independent with gait;Independent with transfers  Able to Take Stairs?: Yes Driving: Yes Vision/Perception  Vision - History Ability to See in Adequate Light: 1 Impaired Perception Perception: Within Functional Limits Praxis Praxis: Intact  Cognition Overall Cognitive Status: Impaired/Different from baseline Arousal/Alertness: Awake/alert Orientation Level: Oriented to person (difficult to assess secondary to receptive aphasia) Attention: Focused Focused Attention: Impaired Immediate Memory Recall:  (unable to assess due to aphasia) Awareness: Impaired Problem Solving: Appears intact (pants were right side out and pt able to correct without cues, also able to correctly orient faucet when sink water began collecting on sink) Problem Solving Impairment: Functional basic Safety/Judgment: Impaired Comments: Does not demonstrate awareness of deficits Sensation Sensation Light Touch: Appears Intact Hot/Cold: Not tested Proprioception: Not tested Stereognosis: Not  tested Additional Comments: difficult to assess secondary to receptive aphasia Coordination Gross Motor Movements are Fluid and Coordinated: No Fine Motor Movements are Fluid and Coordinated: Yes Coordination and Movement Description: Mild balance deficits with delayed balance reactions during functional transfers Finger Nose Finger Test: Unable to perform due to receptive deficits, pt stating "I don't understand" when given multimodal cues regarding how to complete Heel Shin Test: DNT d/t receptive aphasia Motor  Motor Motor: Other (comment) Motor - Skilled Clinical Observations: generalized debility from hospitalization, deficits with standing dynamic > static balance   Trunk/Postural Assessment  Cervical Assessment Cervical Assessment: Exceptions to [facility] (forward head) Thoracic Assessment Thoracic Assessment: Exceptions to [facility] (rounded shoulders) Lumbar Assessment Lumbar Assessment: Exceptions to [facility] (posterior pelvic tilt in sitting) Postural Control Postural Control: Within Functional Limits  Balance Balance Balance Assessed: Yes Static  Sitting Balance Static Sitting - Balance Support: No upper extremity supported;Feet supported Static Sitting - Level of Assistance: 5: Stand by assistance Dynamic Sitting Balance Dynamic Sitting - Balance Support: During functional activity;No upper extremity supported;Feet supported Dynamic Sitting - Level of Assistance: 5: Stand by assistance Dynamic Sitting - Balance Activities: Lateral lean/weight shifting;Forward lean/weight shifting;Reaching for objects;Reaching across midline Static Standing Balance Static Standing - Balance Support: No upper extremity supported;During functional activity Static Standing - Level of Assistance: 4: Min assist;5: Stand by assistance Dynamic Standing Balance Dynamic Standing - Balance Support: During functional activity;Left upper extremity supported Dynamic Standing - Level of Assistance: 4: Min  assist Dynamic Standing - Balance Activities: Lateral lean/weight shifting;Forward lean/weight shifting;Reaching for objects;Reaching across midline Extremity Assessment  RUE Assessment RUE Assessment: Within Functional Limits LUE Assessment LUE Assessment: Within Functional Limits RLE Assessment RLE Assessment: Exceptions to [facility] RLE Strength Right Hip Flexion: 4/5 Right Hip Extension: 4-/5 Right Hip ABduction: 4/5 Right Hip ADduction: 4-/5 Right Knee Flexion: 4-/5 Right Knee Extension: 4/5 Right Ankle Dorsiflexion: 4/5 Right Ankle Plantar Flexion: 4/5 LLE Assessment LLE Assessment: Exceptions to [facility] LLE Strength Left Hip Flexion: 4-/5 Left Hip Extension: 4-/5 Left Hip ABduction: 4-/5 Left Hip ADduction: 4-/5 Left Knee Flexion: 4-/5 Left Knee Extension: 4/5 Left Ankle Dorsiflexion: 4/5 Left Ankle Plantar Flexion: 4/5  Care Tool Care Tool Bed Mobility Roll left and right activity   Roll left and right assist level: Contact Guard/Touching assist    Sit to lying activity   Sit to lying assist level: Contact Guard/Touching assist    Lying to sitting on side of bed activity   Lying to sitting on side of bed assist level: the ability to move from lying on the back to sitting on the side of the bed with no back support.: Minimal Assistance - Patient > 75%     Care Tool Transfers Sit to stand transfer   Sit to stand assist level: Contact Guard/Touching assist    Chair/bed transfer   Chair/bed transfer assist level: Minimal Assistance - Patient > 75%     Toilet transfer   Assist Level: Minimal Assistance - Patient > 75%    Car transfer   Car transfer assist level: Contact Guard/Touching assist;Minimal Assistance - Patient > 75%      Care Tool Locomotion Ambulation   Assist level: Minimal Assistance - Patient > 75% Assistive device: Walker-rolling Max distance: 100 ft  Walk 10 feet activity   Assist level: Contact Guard/Touching assist Assistive device:  Walker-rolling   Walk 50 feet with 2 turns activity   Assist level: Contact Guard/Touching assist Assistive device: Walker-rolling  Walk 150 feet activity Walk 150 feet activity did not occur: Safety/medical concerns      Walk 10 feet on uneven surfaces activity Walk 10 feet on uneven surfaces activity did not occur: Safety/medical concerns      Stairs   Assist level: Minimal Assistance - Patient > 75% Stairs assistive device: 2 hand rails Max number of stairs: 4  Walk up/down 1 step activity   Walk up/down 1 step (curb) assist level: Minimal Assistance - Patient > 75% Walk up/down 1 step or curb assistive device: Walker  Walk up/down 4 steps activity   Walk up/down 4 steps assist level: Minimal Assistance - Patient > 75% Walk up/down 4 steps assistive device: 2 hand rails  Walk up/down 12 steps activity Walk up/down 12 steps activity did not occur: Safety/medical concerns      Pick up small objects from floor  Pick up small object from the floor (from standing position) activity did not occur: Safety/medical concerns      Wheelchair Is the patient using a wheelchair?: Yes Type of Wheelchair: Manual   Wheelchair assist level: Total Assistance - Patient < 25% Max wheelchair distance: 300 ft  Wheel 50 feet with 2 turns activity   Assist Level: Total Assistance - Patient < 25%  Wheel 150 feet activity   Assist Level: Total Assistance - Patient < 25%    Refer to Care Plan for Long Term Goals  SHORT TERM GOAL WEEK 1 PT Short Term Goal 1 (Week 1): Pt will perform bed mobility with supervision and no cues for technique or initiation. PT Short Term Goal 2 (Week 1): pt will perform sit<>stand transfers to no AD with supervision and min cues for technique PT Short Term Goal 3 (Week 1): Pt will perform stand pivot transfers with no AD and supervision requiring min cues for technique PT Short Term Goal 4 (Week 1): Pt will consistently ambulate at least 100 ft with close supervision  using RW without drop in SpO2 below 94%. PT Short Term Goal 5 (Week 1): Pt will perform dynamic standing activities without AD with CGA and min cues.  Recommendations for other services: None   Skilled Therapeutic Intervention Mobility Bed Mobility Bed Mobility: Supine to Sit;Sit to Supine;Sitting - Scoot to Edge of Bed Supine to Sit: Contact Guard/Touching assist;Minimal Assistance - Patient > 75% Sitting - Scoot to [employer] of Bed: Minimal Assistance - Patient > 75%;Contact Guard/Touching assist Sit to Supine: Contact Guard/Touching assist Transfers Transfers: Sit to Stand;Stand Pivot Transfers Sit to Stand: Contact Guard/Touching assist Stand Pivot Transfers: Contact Guard/Touching assist;Minimal Assistance - Patient > 75% Stand Pivot Transfer Details: Tactile cues for placement;Visual cues for safe use of DME/AE;Verbal cues for safe use of DME/AE Transfer (Assistive device): Rolling walker Locomotion  Gait Ambulation: Yes Gait Assistance: Contact Guard/Touching assist Gait Distance (Feet): 100 Feet Assistive device: Rolling walker Gait Gait: Yes Gait Pattern: Step-through pattern;Decreased hip/knee flexion - left;Decreased hip/knee flexion - right;Decreased stance time - right Gait velocity: decreased Stairs / Additional Locomotion Stairs: Yes Stairs Assistance: Contact Guard/Touching assist Stair Management Technique: Two rails Number of Stairs: 4 Height of Stairs: 6 Curb: Minimal Assistance - Patient >75%  Skilled Interventions: PT Evaluation completed; see above for results. PT educated patient in roles of PT vs OT, PT POC, rehab potential, rehab goals, and potential discharge recommendations along with recommendation for follow-up rehabilitation services. Individual treatment initiated:  Patient seated upright in recliner upon PT arrival. Patient alert and agreeable to PT session. No pain complaint during session. Pt's SpO2 checked at start of session with levels at 100%.  Noted that room O2 is set to 3L. Pt reduced to 2L and rechecked a few minutes later in resting seated position with level at 99%. Pt on 2L O2 for remainder of session.   Therapeutic Activity: Bed Mobility: Patient performed sit-->supine with CGA and multimodal cues for initiation and then supine-->sit  with CGA/ Min A for rising up to seated position d/t body habitus and decreased overall strength. Provided vc/ tc for effort and technique. Attempted instructions for log roll technique, however, pt's receptive aphasia prevents understanding of new technique through verbal instruction. Transfers: Patient performed sit <> stand transfers from various seats and heights requiring CGA with use of UE push from armrests. Stand pivot transfers require light MinA for walker mgmt and positioning. Provided vc/tc for maintaining walker placement in front of  pt at all times as pt has tendency to discard walker prior to safe positioning.  Gait Training:  Patient ambulated 23' x1/ 20' x1/ 100' x1 using RW and 25' x1 with no AD requiring MinA for balance as pt seen to demo crossover stepping and increased imbalance to R/L sides. Quality of gait improved with use of RW with slight forward trunk flexion and decreased gait speed. Provided vc/tc for upright posture/ level gaze.  Pt demonstrates step negotiation using BHR and navigates four 6" steps with reciprocal gait pattern to ascend with CGA and descends leading with LLE in step-to pattern with vc for foot clearance of each step and MinA to assure heel clearance.   Patient seated  in recliner at end of session with brakes locked, seat alarm set, and all needs within reach. Tray table in front of pt. Pt oriented to time and time of next therapy session with speech therapy to begin momentarily.    Discharge Criteria: Patient will be discharged from PT if patient refuses treatment 3 consecutive times without medical reason, if treatment goals not met, if there is a change  in medical status, if patient makes no progress towards goals or if patient is discharged from hospital.  The above assessment, treatment plan, treatment alternatives and goals were discussed and mutually agreed upon: by patient  Alger Simons PT, DPT 03/08/2021, 5:39 PM

## 2021-03-08 NOTE — Progress Notes (Signed)
PROGRESS NOTE   Subjective/Complaints:  No issues overnite , pt without c/o but has reduce comprehension an donly answers within context  ROS- limited due to aphasia  Objective:   No results found. Recent Labs    03/07/21 0250 03/08/21 0532  WBC 11.3* 11.0*  HGB 11.2* 11.1*  HCT 34.8* 34.7*  PLT 290 319   Recent Labs    03/07/21 0250 03/08/21 0532  NA 138 138  K 3.7 3.9  CL 96* 97*  CO2 34* 32  GLUCOSE 90 93  BUN 24* 23  CREATININE 1.15* 1.07*  CALCIUM 9.0 8.9    Intake/Output Summary (Last 24 hours) at 03/08/2021 0821 Last data filed at 03/08/2021 0757 Gross per 24 hour  Intake 240 ml  Output --  Net 240 ml        Physical Exam: Vital Signs Blood pressure 123/64, pulse 64, temperature 97.6 F (36.4 C), resp. rate 18, SpO2 99 %. ENT- O2 per [employer]: No acute distress Mood and affect are appropriate Heart: Regular rate and rhythm no rubs murmurs or extra sounds Lungs: Clear to auscultation, breathing unlabored, no rales or wheezes, decreased BS at bases  Abdomen: Positive bowel sounds, soft nontender to palpation, nondistended Extremities: No clubbing, cyanosis, or edema Skin: No evidence of breakdown, no evidence of rash Neurologic: Cranial nerves II through XII intact, motor strength is 5/5 in bilateral deltoid, bicep, tricep, grip, hip flexor, knee extensors, ankle dorsiflexor and plantar flexor Sensory exam unable to assess due to aphasia Unable to follow simple commands without gestural cues, has occ spont phrase level expression "I hate these britches" Musculoskeletal: Full range of motion in all 4 extremities. No joint swelling    Assessment/Plan: 1. Functional deficits which require 3+ hours per day of interdisciplinary therapy in a comprehensive inpatient rehab setting. Physiatrist is providing close team supervision and 24 hour management of active medical problems listed  below. Physiatrist and rehab team continue to assess barriers to discharge/monitor patient progress toward functional and medical goals  Care Tool:  Bathing              Bathing assist       Upper Body Dressing/Undressing Upper body dressing        Upper body assist      Lower Body Dressing/Undressing Lower body dressing            Lower body assist       Toileting Toileting    Toileting assist       Transfers Chair/bed transfer  Transfers assist           Locomotion Ambulation   Ambulation assist              Walk 10 feet activity   Assist           Walk 50 feet activity   Assist           Walk 150 feet activity   Assist           Walk 10 feet on uneven surface  activity   Assist           Wheelchair     Assist  Wheelchair 50 feet with 2 turns activity    Assist            Wheelchair 150 feet activity     Assist          Blood pressure 123/64, pulse 64, temperature 97.6 F (36.4 C), resp. rate 18, SpO2 99 %.  Medical Problem List and Plan: 1.   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  Antithrombotics: -DVT/anticoagulation:  Pharmaceutical: Other (comment) Eliquis             -antiplatelet therapy: N/A 3. Pain Management: Tylenol as needed 4. Mood: Provide emotional support             -antipsychotic agents: N/A 5. Neuropsych: This patient is not capable of making decisions on her own behalf. 6. Skin/Wound Care: Routine skin checks 7. Fluids/Electrolytes/Nutrition: Routine in and outs with follow-up chemistries 8.  Atrial fibrillation with RVR.  Status post cardioversion.  Follow-up cardiology services.  Amiodarone 200 mg  daily, verapamil 40 mg every 8 hours. 9.   Hypotension.  Currently on ProAmatine 10 mg 3 times daily. Vitals:   03/07/21 2030 03/08/21 0538  BP: (!) 135/58 123/64  Pulse:  64  Resp:  18  Temp:  97.6 F (36.4 C)  SpO2:  99%   HR and BP controlled cont current meds  10.  Hyperlipidemia.  Lipitor 11.  History of COPD.  Monitor oxygen saturations.  Continue nebulizers 12.  Leukocytosis.  Chest x-ray negative.  Completed course of doxycycline  13.  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

## 2021-03-08 NOTE — Progress Notes (Signed)
Inpatient Rehabilitation Care Coordinator Assessment and Plan Patient Details  Name: Michelle Dean MRN: [id number] Date of Birth: October 10, 1945  Today's Date: 03/08/2021  Hospital Problems: Principal Problem:   Left middle cerebral artery stroke [facility])  Past Medical History: History reviewed. No pertinent past medical history. Past Surgical History:  Past Surgical History:  Procedure Laterality Date   BUBBLE STUDY  03/01/2021   Procedure: BUBBLE STUDY;  Surgeon: Tessa Lerner, DO;  Location: [facility] ENDOS[facility];  Service: Cardiovascular;;   CARDIOVERSION N/A 03/01/2021   Procedure: CARDIOVERSION;  Surgeon: Tessa Lerner, DO;  Location: [facility];  Service: Cardiovascular;  Laterality: N/A;   CARDIOVERSION N/A 03/05/2021   Procedure: CARDIOVERSION;  Surgeon: Yates Decamp, MD;  Location: [facility] ENDOSCOPY;  Service: Cardiovascular;  Laterality: N/A;   IR CT HEAD LTD  02/25/2021   IR PERCUTANEOUS ART THROMBECTOMY/INFUSION INTRACRANIAL INC DIAG ANGIO  02/25/2021   RADIOLOGY WITH ANESTHESIA N/A 02/25/2021   Procedure: IR WITH ANESTHESIA;  Surgeon: Radiologist, Medication, MD;  Location: [facility] OR;  Service: Radiology;  Laterality: N/A;   TEE WITHOUT CARDIOVERSION N/A 03/01/2021   Procedure: TRANSESOPHAGEAL ECHOCARDIOGRAM (TEE);  Surgeon: Tessa Lerner, DO;  Location: [facility] ENDOS[facility];  Service: Cardiovascular;  Laterality: N/A;   Social History:  has no history on file for tobacco use, alcohol use, and drug use.  Family / Support Systems Marital Status: Widow/Widower Patient Roles: Parent Children: Toby-son  [phone]  [phone] Other Supports: Bryson-nephew-[phone] Anticipated Caregiver: Toby and other family members Ability/Limitations of Caregiver: Toby works but they will come up with a discharge plan for Mom Caregiver Availability: 24/7 Family Dynamics: Close knit family who rely upon one another. Pt has been active and very independent prior to this stroke.  Social  History Preferred language: English Religion:  Cultural Background: No issues Education: HS Health Literacy - How often do you need to have someone help you when you read instructions, pamphlets, or other written material from your doctor or pharmacy?: Patient unable to respond Writes: Yes Employment Status: Retired Marine scientist Issues: No issues Guardian/Conservator: None-according to MD pt is not fully capable of making her own decisions at this time will look toward her only child-son to make any decisions while here   Abuse/Neglect Abuse/Neglect Assessment Can Be Completed: Yes Physical Abuse: Denies Verbal Abuse: Denies Sexual Abuse: Denies Exploitation of patient/patient's resources: Denies Self-Neglect: Denies  Patient response to: Social Isolation - How often do you feel lonely or isolated from those around you?: Patient unable to respond  Emotional Status Pt's affect, behavior and adjustment status: Pt tries to speak but doesn;t make much sense. She has automatic conversation. Son reports she has always been very independent and taken care of herself and her home. Still mows the yard. Recent Psychosocial Issues: other health issues Psychiatric History: No history deferred depresison screen due to expressive and receptive aphasia frm CVA Substance Abuse History: Tobacco on intermittent O2 at home so not the best she still smokes. Son aware of the risks of this and reports when smoking she is off her O2  Patient / Family Perceptions, Expectations & Goals Pt/Family understanding of illness & functional limitations: Son is able to explain her stroke and deficits. Unsure if pt is aware she has had a stroke. She seems very motivated and cheerful in her responses Premorbid pt/family roles/activities: Mom, retiree, grandmother, home owner, etc Anticipated changes in roles/activities/participation: resume Pt/family expectations/goals: Pt states: " I'm fine you fine  too."  Son states: " I hope she does well there, she is  stubborn and does what she wants."  [employer]: None Premorbid Home Care/DME Agencies: Other (Comment) (home O2 son unsure where it comes from) Transportation available at discharge: pt drove PTA-son will provide transport now Is the patient able to respond to transportation needs?: Yes In the past 12 months, has lack of transportation kept you from medical appointments or from getting medications?: No In the past 12 months, has lack of transportation kept you from meetings, work, or from getting things needed for daily living?: No  Discharge Planning Living Arrangements: Alone Support Systems: Children, Friends/neighbors Type of Residence: Private residence Insurance Resources: Media planner (specify) Investment banker, operational) Financial Resources: Restaurant manager, fast food Screen Referred: No Living Expenses: Own Money Management: Patient Does the patient have any problems obtaining your medications?: No Home Management: Self Patient/Family Preliminary Plans: Unsure of the currentl plan, depends upon her progress while here. Son hopes she will recover and be able to do as much as she can for herself. Will await therapy evaluations and work on a safe discharge plan. Care Coordinator Barriers to Discharge: Medication compliance, Insurance for SNF coverage Care Coordinator Anticipated Follow Up Needs: HH/OP  Clinical Impression Pleasant patient who is willing to work in therapies to recover, has expressive and receptive aphasia so difficult to assess, information received from brief conversation with son. Unsure how realistic he is regarding she will need 24/7 care at home if her speech doesn't progress for safety issues. Will await therapy team evaluations and Christina-primary social worker will follow once she returns 11/1  Michelle Dean 03/08/2021, 12:03 PM

## 2021-03-08 NOTE — Evaluation (Signed)
Speech Language Pathology Assessment and Plan  Patient Details  Name: Michelle Dean MRN: [id number] Date of Birth: 1945/11/09  SLP Diagnosis: Aphasia;Cognitive Impairments  Rehab Potential: Good ELOS: 2 weeks    Today's Date: 03/08/2021 SLP Individual Time: 0900-1000 SLP Individual Time Calculation (min): 60 min   Hospital Problem: Principal Problem:   Left middle cerebral artery stroke [facility])  Past Medical History: History reviewed. No pertinent past medical history. Past Surgical History:  Past Surgical History:  Procedure Laterality Date   BUBBLE STUDY  03/01/2021   Procedure: BUBBLE STUDY;  Surgeon: Rex Kras, DO;  Location: [place];  Service: Cardiovascular;;   CARDIOVERSION N/A 03/01/2021   Procedure: CARDIOVERSION;  Surgeon: Rex Kras, DO;  Location: [place] ENDOSCOPY;  Service: Cardiovascular;  Laterality: N/A;   CARDIOVERSION N/A 03/05/2021   Procedure: CARDIOVERSION;  Surgeon: Adrian Prows, MD;  Location: [place];  Service: Cardiovascular;  Laterality: N/A;   IR CT HEAD LTD  02/25/2021   IR PERCUTANEOUS ART THROMBECTOMY/INFUSION INTRACRANIAL INC DIAG ANGIO  02/25/2021   RADIOLOGY WITH ANESTHESIA N/A 02/25/2021   Procedure: IR WITH ANESTHESIA;  Surgeon: Radiologist, Medication, MD;  Location: [place];  Service: Radiology;  Laterality: N/A;   TEE WITHOUT CARDIOVERSION N/A 03/01/2021   Procedure: TRANSESOPHAGEAL ECHOCARDIOGRAM (TEE);  Surgeon: Rex Kras, DO;  [facility];  Service: Cardiovascular;  Laterality: N/A;    Assessment / Plan / Recommendation Clinical Impression Patient presents with a mixed receptive-expressive aphasia with expressive language being mild-moderately impaired and receptive language being severely impaired. Patient unable to follow verbal one step or printed one step directions but was able to imitate SLP to point to her nose, ear. She named 3 object pictures(line drawings) without cues and named 5 correctly when given 4 choice  printed word cues. She could not identify alphabet letters or name any alphabet letters but was able to locate and name correct letter when SLP printed her name with one letter misspelled. When presented with common objects, she was able to correctly name approximatey 50% and for the other 50%, described function, ie for mirror, "I could look at myself but I dont want to". The only awareness to her impairments was when she would comment, "I know what it is, but I cant." When asked why she was here (using gestures, basic language, 'why here', etc) she stated "cause Im sick, not sick as I was". SLP was unable to assess memory and other cognitive areas due to patient's severe receptive language impairment. She will benefit from skilled SLP intervention to maximize her receptive, expressive language and cognitive function prior to discharge.  Skilled Therapeutic Interventions          Speech-language evaluation, [place] Naming Test (short version)  SLP Assessment  Patient will need skilled [facility] during CIR admission    Recommendations  Patient destination: Home Follow up Recommendations: Home Health SLP;24 hour supervision/assistance;Outpatient SLP Equipment Recommended: None recommended by SLP    SLP Frequency 3 to 5 out of 7 days   SLP Duration  SLP Intensity  SLP Treatment/Interventions 2 weeks Michelle Dean is a 75 year old right-handed female with history of COPD with intermittent oxygen at home, hypertension and hyperlipidemia.  Per chart review lives alone.  Independent prior to admission.  Patient does have supportive family in the area.  [facility] with expressive receptive aphasia.  CT of the head showed no acute intracranial hemorrhage or evidence of acute infarction.  Age-indeterminate small vessel infarcts of the left ganglial capsular  region.  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  CTA revealed a left M2/M3 occlusion  patient was transferred to [facility] for four-vessel angiogram and underwent partial recanalization with reocclusion followed by mechanical thrombolysis achieving a TICI revascularization.  Hospital course 10/19 developed atrial fibrillation. Tolerating a regular consistency diet. Therapy evaluations completed due to patient decreased functional ability and aphasia was admitted for a comprehensive rehab program.  Minumum of 1-2 x/day, 30 to 90 minutes  Cognitive remediation/compensation;Speech/Language facilitation;Environmental controls;Patient/family education;Functional tasks    Pain Pain Assessment Pain Scale: Faces Faces Pain Scale: No hurt  Prior Functioning Cognitive/Linguistic Baseline: Within functional limits Type of Home: House  Lives With: Alone Available Help at Discharge: Family;Available 24 hours/day Education: high school Vocation: Retired  Programmer, systems Overall Cognitive Status: Impaired/Different from baseline Arousal/Alertness: Awake/alert Orientation Level: Oriented to person;Other (comment) (difficult to assess secondary to receptive aphasia) Year: Other (Comment) (incorrect with 4-choice, correct with 2-choice) Attention: Focused Focused Attention: Impaired Focused Attention Impairment: Verbal basic;Functional basic Memory:  (did not assess as patient's receptive aphasia is severe) Awareness: Impaired Awareness Impairment: Intellectual impairment Problem Solving: Impaired Problem Solving Impairment: Verbal basic;Functional basic Executive Function: Self Monitoring Self Monitoring: Impaired Self Monitoring Impairment: Verbal basic;Functional basic Safety/Judgment: Impaired Comments: Does not demonstrate awareness to deficits  Comprehension Auditory Comprehension Overall Auditory Comprehension: Impaired Yes/No Questions: Impaired Basic Biographical Questions: 0-25% accurate Basic Immediate Environment Questions: 0-24%  accurate Commands: Impaired One Step Basic Commands: 0-24% accurate Conversation: Simple Interfering Components: Processing speed;Attention EffectiveTechniques: Visual/Gestural cues;Repetition;Extra processing time Visual Recognition/Discrimination Discrimination: Exceptions to [facility] Black/White Line Drawings: Unable to identify Reading Comprehension Reading Status: Impaired Word level: Impaired Sentence Level: Not tested Paragraph Level: Not tested Functional Environmental (signs, name badge): Not tested Interfering Components: Attention;Processing time Effective Techniques: Other (comment) (no techniques worked) Expression Expression Primary Mode of Expression: Verbal Verbal Expression Overall Verbal Expression: Impaired Initiation: No impairment Automatic Speech: Name Level of Generative/Spontaneous Verbalization: Phrase;Word Repetition: Impaired Level of Impairment: Word level Naming: Impairment Responsive: 51-75% accurate Confrontation: Impaired Black/White Line Drawings: Unable to identify Convergent: Not tested Divergent: Not tested Verbal Errors: Semantic paraphasias;Not aware of errors;Phonemic paraphasias Pragmatics: Impairment Impairments: Abnormal affect Interfering Components: Attention Effective Techniques: Written cues;Open ended questions;Semantic cues Non-Verbal Means of Communication: Not applicable Written Expression Dominant Hand: Right Written Expression: Not tested Oral Motor Oral Motor/Sensory Function Overall Oral Motor/Sensory Function: Within functional limits Motor Speech Overall Motor Speech: Appears within functional limits for tasks assessed Respiration: Within functional limits Resonance: Within functional limits Articulation: Within functional limitis Intelligibility: Intelligible Motor Planning: Witnin functional limits  Care Tool Care Tool Cognition Ability to hear (with hearing aid or hearing appliances if normally used Ability  to hear (with hearing aid or hearing appliances if normally used):  (difficult to assess secondary to severe receptive language impairment)   Expression of Ideas and Wants Expression of Ideas and Wants: 2. Frequent difficulty - frequently exhibits difficulty with expressing needs and ideas   Understanding Verbal and Non-Verbal Content Understanding Verbal and Non-Verbal Content: 1. Rarely/never understands  Memory/Recall Ability Memory/Recall Ability : None of the above were recalled      Short Term Goals: Week 1: SLP Short Term Goal 1 (Week 1): Patient will follow verbal one-step commands (point to nose, etc) with maxA verbal, visual/modeling/gestural cues. SLP Short Term Goal 2 (Week 1): Patient will name common objects and/or object pictures with 80% accuracy and modA verbal cues. SLP Short Term Goal 3 (Week 1): Patient will be able to  point to identify alphabet letters and single digit numbers in field of 2-3, with modA verbal cues. SLP Short Term Goal 4 (Week 1): Patient will be able to answer basic level What and Where questions with modA verbal and visual cues. SLP Short Term Goal 5 (Week 1): Patient will demonstrate ability to answer basic biographical yes/no questions with modA verbal cues.  Refer to Care Plan for Long Term Goals  Recommendations for other services: None   Discharge Criteria: Patient will be discharged from SLP if patient refuses treatment 3 consecutive times without medical reason, if treatment goals not met, if there is a change in medical status, if patient makes no progress towards goals or if patient is discharged from hospital.  The above assessment, treatment plan, treatment alternatives and goals were discussed and mutually agreed upon: No family available/patient unable  Sonia Baller, MA, CCC-SLP Speech Therapy

## 2021-03-08 NOTE — Progress Notes (Signed)
Physical Therapy Session Note  Patient Details  Name: Michelle Dean MRN: [id number] Date of Birth: 1945-07-15  Today's Date: 03/08/2021 PT Individual Time: 3794-4461 PT Individual Time Calculation (min): 24 min   Short Term Goals: Week 1:  PT Short Term Goal 1 (Week 1): Pt will perform bed mobility with supervision and no cues for technique or initiation. PT Short Term Goal 2 (Week 1): pt will perform sit<>stand transfers to no AD with supervision and min cues for technique PT Short Term Goal 3 (Week 1): Pt will perform stand pivot transfers with no AD and supervision requiring min cues for technique PT Short Term Goal 4 (Week 1): Pt will consistently ambulate at least 100 ft with close supervision using RW without drop in SpO2 below 94%. PT Short Term Goal 5 (Week 1): Pt will perform dynamic standing activities without AD with CGA and min cues.   Skilled Therapeutic Interventions/Progress Updates:   Pt received sitting in WC and agreeable to PT  Pt performed 5 time sit<>stand (5xSTS): 28 sec (>15 sec indicates increased fall risk) pt noted to require cues throughout due to processing deficits.   PT instructed pt in TUG: 23 sec (average of 3 trials; >13.5 sec indicates increased fall risk) UE supported on RW. Pt then performed without AD at 22 sec. Min assist for safety and control of Supplemental O2 for all gait training.   Patient returned to room and performed stand pivot to recliner with no AD and min assist for safety. Pt left sitting in recliner with call bell in reach and all needs met.         Therapy Documentation Precautions:  Precautions Precautions: Fall Precaution Comments: receptive>expressive aphasia, 2L O2 via [place] if SpO2 <94%, TEDs daily Restrictions Weight Bearing Restrictions: No  Vital Signs: Therapy Vitals Temp: 98 F (36.7 C) Pulse Rate: 62 Resp: 18 BP: 138/72 Patient Position (if appropriate): Sitting Oxygen Therapy SpO2: 98 % O2 Device: Nasal  Cannula O2 Flow Rate (L/min): 2 L/min Pain: Pain Assessment Pain Scale: 0-10 Pain Score: 0-No pain Faces Pain Scale: No hurt   Therapy/Group: Individual Therapy  Lorie Phenix 03/08/2021, 5:01 PM

## 2021-03-08 NOTE — IPOC Note (Addendum)
Overall Plan of Care [facility]) Patient Details Name: Michelle Dean MRN: [id number] DOB: [birth year]  Admitting Diagnosis: Left middle cerebral artery stroke [facility])  Hospital Problems: Principal Problem:   Left middle cerebral artery stroke [facility])     Functional Problem List: Nursing Bowel, Endurance, Medication Management, Safety  PT Balance, Endurance, Motor, Perception, Safety  OT Balance, Cognition, Edema, Endurance, Motor, Pain, Safety  SLP Behavior, Cognition, Perception, Safety, Linguistic, Motor  TR         Basic ADL's: OT Grooming, Bathing, Dressing, Toileting     Advanced  ADL's: OT Simple Meal Preparation     Transfers: PT Bed Mobility, Bed to Chair, Car, Occupational psychologist, Research scientist (life sciences): PT Ambulation, Psychologist, prison and probation services, Stairs     Additional Impairments: OT None  SLP Social Cognition   Problem Solving, Restaurant manager, fast food, Attention  TR      Anticipated Outcomes Item Anticipated Outcome  Self Feeding No goal  Swallowing  N/A   Basic self-care  Marketing executive Transfers Supervision  Bowel/Bladder  manage bowel w mod I  Transfers  Mod I  Locomotion  Mod I/ supervision  Communication  modA basic auditory comprehension, minA expression  Cognition  supervision A for familiar, basic level tasks  Pain  n/a  Safety/Judgment  maintain w cues   Therapy Plan: PT Intensity: Minimum of 1-2 x/day ,45 to 90 minutes PT Frequency: 5 out of 7 days PT Duration Estimated Length of Stay: 12-14 days OT Intensity: Minimum of 1-2 x/day, 45 to 90 minutes OT Frequency: 5 out of 7 days OT Duration/Estimated Length of Stay: 10-12 days SLP Intensity: Minumum of 1-2 x/day, 30 to 90 minutes SLP Frequency: 3 to 5 out of 7 days SLP Duration/Estimated Length of Stay: 2 weeks   Due to the current state of emergency, patients may not be receiving their 3-hours of Medicare-mandated therapy.   Team Interventions: Nursing  Interventions Disease Management/Prevention, Medication Management, Discharge Planning, Bowel Management, Patient/Family Education, Dysphagia/Aspiration Precaution Training  PT interventions Ambulation/gait training, Community reintegration, DME/adaptive equipment instruction, Neuromuscular re-education, Psychosocial support, Museum/gallery curator, UE/LE Strength taining/ROM, Wheelchair propulsion/positioning, Warden/ranger, Discharge planning, Functional electrical stimulation, Pain management, Therapeutic Activities, UE/LE Coordination activities, Cognitive remediation/compensation, Disease management/prevention, Functional mobility training, Patient/family education, Splinting/orthotics, Therapeutic Exercise, Visual/perceptual remediation/compensation  OT Interventions DME/adaptive equipment instruction, Patient/family education, Therapeutic Activities, Therapeutic Exercise, Psychosocial support, Cognitive remediation/compensation, Warden/ranger, Wheelchair propulsion/positioning, Community reintegration, Functional mobility training, Self Care/advanced ADL retraining, UE/LE Strength taining/ROM, UE/LE Coordination activities, Neuromuscular re-education, Discharge planning, Disease mangement/prevention, Pain management  SLP Interventions Cognitive remediation/compensation, Speech/Language facilitation, Environmental controls, Patient/family education, Functional tasks  TR Interventions    SW/CM Interventions Discharge Planning, Psychosocial Support, Patient/Family Education   Barriers to Discharge MD  Medical stability  Nursing Lack of/limited family support, Home environment access/layout 1 level 1 ste right rail solo, son nearby  PT Inaccessible home environment, Decreased caregiver support, Home environment Best boy, Lack of/limited family support, Community education officer for SNF coverage, Weight    OT Lack of/limited family support pts son reported that he can provide supervision for  a few weeks at most  SLP Decreased caregiver support patient lives alone, son works  SW Medication compliance, Community education officer for [employer] coverage     Team Discharge Planning: Destination: PT-Home ,OT- Home , SLP-Home Projected Follow-up: PT-Home health PT, Outpatient PT (dependent on progress at time of d/c), OT-  Home health OT, 24 hour supervision/assistance, SLP-Home Health SLP, 24 hour supervision/assistance, Outpatient SLP Projected  Equipment Needs: PT-To be determined, OT- To be determined, SLP-None recommended by SLP Equipment Details: PT- , OT-  Patient/family involved in discharge planning: PT- Patient,  OT-Patient, Family member/caregiver, SLP-Patient unable/family or caregive not available  MD ELOS: 14-16d Medical Rehab Prognosis:  Good Assessment: 75 year old right-handed female with history of COPD with intermittent oxygen at home, hypertension and hyperlipidemia.  Per chart review lives alone.  Independent prior to admission.  1 level home one-step to entry.  Patient does have supportive family in the area.  Presented [facility]/25/20[facility] to the [facility] with expressive receptive aphasia.  CT of the head showed no acute intracranial hemorrhage or evidence of acute infarction.  Age-indeterminate small vessel infarcts of the left ganglial capsular region.  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  Patient was administered TNK.  CTA revealed a left M2/M3 occlusion patient was transferred to [facility] for four-vessel angiogram and underwent partial recanalization with reocclusion followed by mechanical thrombolysis achieving a TICI revascularization.  Hospital course 10/19 developed atrial fibrillation RVR started on Cardizem however RVR persisted with cardiology service follow-up underwent cardioversion 10/21 unfortunately back into A. fib and again with cardioversion 03/05/2021 sustaining NSR.  TEE  ejection fraction 50-55% no thrombus or vegetation.  Patient currently is maintained on  Eliquis.  Close monitoring of blood pressure maintained on ProAmatine.  Patient did have some leukocytosis 23,600 improved to 14,000 and latest chest x-ray showed no signs of pneumonia she is currently completing a course of doxycycline.  Tolerating a regular consistency diet.  Therapy evaluations completed due to patient decreased functional ability and aphasia was admitted for a comprehensive rehab program.    See Team Conference Notes for weekly updates to the plan of care

## 2021-03-09 NOTE — Progress Notes (Signed)
Occupational Therapy Session Note  Patient Details  Name: Michelle Dean MRN: [id number] Date of Birth: 1946/03/27  Today's Date: 03/09/2021 OT Individual Time: 5809-9833 OT Individual Time Calculation (min): 69 min    Short Term Goals: Week 1:  OT Short Term Goal 1 (Week 1): Pt will complete ambulatory toilet transfer with CGA OT Short Term Goal 2 (Week 1): Pt will complete grooming tasks while standing at the sink with supervision for balance OT Short Term Goal 3 (Week 1): Pt will complete shower transfer with no more than CGA  Skilled Therapeutic Interventions/Progress Updates:    Pt received seated in recliner, no c/o pain, agreeable to therapy. Session focus on self-care retraining, activity tolerance, functional cognition, functional mobility, dynamic standing balance in prep for improved ADL/IADL/func mobility performance + decreased caregiver burden. Declined getting dressed, requesting to just don house coat. Able to don with S. Ambulated throughout session with CGA for balance and no AD. Able to complete oral care / grooming tasks in standing with S.  Total A w/c transport to and from gym for time management.   Standing at [place], completed the following games to target following instructions, problem solving, and static standing balance  -Single Target: 97.78%, 1 min, 1.36 min; after moderate visual demonstrate, pt able to complete game accurately with no further cuing   -Memory Game: 60% accuracy, requiring step-by-step visual and verbal cues to accurately complete  In kitchen, made macaroni when presented with ingredients and verbally instructed. Pt required assist to gather items due to receptive aphasia and did not refer to box instructions. Selected fork to stir ingredients and required physical assist to open packets. Able to turn on stove appropriately when cued and to mix ingredients. Activity ceased as pt reported feeling unwell due to smell. Able to clean dishes  appropriately with mod visual and verbal cues for initiation.   Finally, pt able to locat 10 orange cones around gym with only min verbal and visual cues for understanding instructions and min A to locate final cone.  In room, completed ambulatory toilet transfer with CGA and completed toileting tasks with S post continent void of bladder.   Pt reports dry mouth throughout session, LPN made aware of pt's request for something for dry mouth.  SatO2 at 97% throughout session on 2L via [place].  Pt left seated in recliner with chair alarm engaged, call bell in reach, and all immediate needs met.    Therapy Documentation Precautions:  Precautions Precautions: Fall Precaution Comments: receptive>expressive aphasia, 2L O2 via  if SpO2 <94%, TEDs daily Restrictions Weight Bearing Restrictions: No  Pain:  No c/o ADL: See Care Tool for more details.   Therapy/Group: Individual Therapy  Volanda Napoleon MS, OTR/L  03/09/2021, 6:46 AM

## 2021-03-09 NOTE — Plan of Care (Signed)
  Problem: RH Balance Goal: LTG Patient will maintain dynamic standing balance (PT) Description: LTG:  Patient will maintain dynamic standing balance with assistance during mobility activities (PT) Flowsheets (Taken 03/08/2021 1949) LTG: Pt will maintain dynamic standing balance during mobility activities with:: Supervision/Verbal cueing   Problem: Sit to Stand Goal: LTG:  Patient will perform sit to stand with assistance level (PT) Description: LTG:  Patient will perform sit to stand with assistance level (PT) Flowsheets (Taken 03/08/2021 1949) LTG: PT will perform sit to stand in preparation for functional mobility with assistance level: Independent with assistive device   Problem: RH Bed Mobility Goal: LTG Patient will perform bed mobility with assist (PT) Description: LTG: Patient will perform bed mobility with assistance, with/without cues (PT). Flowsheets (Taken 03/08/2021 1949) LTG: Pt will perform bed mobility with assistance level of: Independent   Problem: RH Bed to Chair Transfers Goal: LTG Patient will perform bed/chair transfers w/assist (PT) Description: LTG: Patient will perform bed to chair transfers with assistance (PT). Flowsheets (Taken 03/08/2021 1949) LTG: Pt will perform Bed to Chair Transfers with assistance level: Independent with assistive device    Problem: RH Car Transfers Goal: LTG Patient will perform car transfers with assist (PT) Description: LTG: Patient will perform car transfers with assistance (PT). Flowsheets (Taken 03/08/2021 1949) LTG: Pt will perform car transfers with assist:: Supervision/Verbal cueing   Problem: RH Furniture Transfers Goal: LTG Patient will perform furniture transfers w/assist (OT/PT) Description: LTG: Patient will perform furniture transfers  with assistance (OT/PT). Flowsheets (Taken 03/08/2021 1949) LTG: Pt will perform furniture transfers with assist:: Independent with assistive device    Problem: RH Ambulation Goal:  LTG Patient will ambulate in home environment (PT) Description: LTG: Patient will ambulate in home environment, # of feet with assistance (PT). Flowsheets (Taken 03/08/2021 1949) LTG: Pt will ambulate in home environ  assist needed:: Supervision/Verbal cueing LTG: Ambulation distance in home environment: at least 50 ft with LRAD Goal: LTG Patient will ambulate in community environment (PT) Description: LTG: Patient will ambulate in community environment, # of feet with assistance (PT). Flowsheets (Taken 03/08/2021 1949) LTG: Pt will ambulate in community environ  assist needed:: Supervision/Verbal cueing LTG: Ambulation distance in community environment: >200 ft per bout using LRAD   Problem: RH Stairs Goal: LTG Patient will ambulate up and down stairs w/assist (PT) Description: LTG: Patient will ambulate up and down # of stairs with assistance (PT) Flowsheets (Taken 03/08/2021 1949) LTG: Pt will ambulate up/down stairs assist needed:: Supervision/Verbal cueing LTG: Pt will  ambulate up and down number of stairs: at least 4 steps with HR setup as per home environment

## 2021-03-09 NOTE — Progress Notes (Signed)
Speech Language Pathology Daily Session Note  Patient Details  Name: Michelle Dean MRN: [id number] Date of Birth: 1946-03-13  Today's Date: 03/09/2021 SLP Individual Time: 0800-0900 SLP Individual Time Calculation (min): 60 min  Short Term Goals: Week 1: SLP Short Term Goal 1 (Week 1): Patient will follow verbal one-step commands (point to nose, etc) with maxA verbal, visual/modeling/gestural cues. SLP Short Term Goal 2 (Week 1): Patient will name common objects and/or object pictures with 80% accuracy and modA verbal cues. SLP Short Term Goal 3 (Week 1): Patient will be able to point to identify alphabet letters and single digit numbers in field of 2-3, with modA verbal cues. SLP Short Term Goal 4 (Week 1): Patient will be able to answer basic level What and Where questions with modA verbal and visual cues. SLP Short Term Goal 5 (Week 1): Patient will demonstrate ability to answer basic biographical yes/no questions with modA verbal cues.  Skilled Therapeutic Interventions: Pt seen for skilled ST with focus on communication goals, pt up in recliner and motivated for therapeutic tasks. SLP facilitating simple "what" questions related to biographical information and family by providing mod-max A cues to aid in comprehension. Pt mostly unaware of errored responses. Pt naming common pictures with ~50% accuracy, does demonstrate increased awareness when naming pictures incorrectly or when experiencing word finding difficulties or paraphasias. Pt often stating "I know it but I can't say it". Given 2 choices or phrase completion cues, pt able to increase naming to ~80% accuracy. SLP writing 3 letters out of sequential order on white board for pt to locate, able to do this with mod cues 33% of opportunities. Pt required extra time to locate letters, often times forgetting letter she was supposed to be looking for during extended time requiring verbal cues. Pt left in recliner with alarm belt on, lights  off per preference and all needs within reach. Cont ST POC.  Pain Pain Assessment Pain Scale: 0-10 Pain Score: 0-No pain  Therapy/Group: Individual Therapy  Tacey Ruiz 03/09/2021, 8:21 AM

## 2021-03-09 NOTE — Progress Notes (Signed)
Physical Therapy Session Note  Patient Details  Name: Michelle Dean MRN: [id number] Date of Birth: 03-20-1946  Today's Date: 03/09/2021 PT Individual Time: 4098-1191 PT Individual Time Calculation (min): 57 min   Short Term Goals: Week 1:  PT Short Term Goal 1 (Week 1): Pt will perform bed mobility with supervision and no cues for technique or initiation. PT Short Term Goal 2 (Week 1): pt will perform sit<>stand transfers to no AD with supervision and min cues for technique PT Short Term Goal 3 (Week 1): Pt will perform stand pivot transfers with no AD and supervision requiring min cues for technique PT Short Term Goal 4 (Week 1): Pt will consistently ambulate at least 100 ft with close supervision using RW without drop in SpO2 below 94%. PT Short Term Goal 5 (Week 1): Pt will perform dynamic standing activities without AD with CGA and min cues.   Skilled Therapeutic Interventions/Progress Updates:   Pt received sitting in recliner and agreeable to PT. Pt maintain on 2L/min O2 throughout session with SpO2 99% throughout session with activity and at rest.  Ambulatory transfer to [facility] with CGA and no AD. Pt transported rehab gym in [facility]. Dynamic gait training without AD to weave through cones and step up 6inch curb step.   Dynamic balance training to perform ball toss on rebounder level surface x 1 min airex pad x 1 min red wedge x 1 min. Attempted blue wedge but unable to perform due to heavy posterior LOB.   Nustep reciprocal movement training 2x 3 min with cues for decreased speed to prevent excessive fatigue.  Stand pivot transfer to [facility] with supervision assist with cues for safety and use of ar rest for safety.   Patient returned to room and performed stand pivot to recliner without AD. Pt left sitting in recliner with call bell in reach and all needs met.         Therapy Documentation Precautions:  Precautions Precautions: Fall Precaution Comments: receptive>expressive aphasia, 2L  O2 via [place] if SpO2 <94%, TEDs daily Restrictions Weight Bearing Restrictions: No  Vital Signs: Therapy Vitals Temp: 98 F (36.7 C) Pulse Rate: (!) 51 Resp: 18 BP: 101/63 Patient Position (if appropriate): Sitting Oxygen Therapy SpO2: 94 % O2 Device: Nasal Cannula O2 Flow Rate (L/min): 3 L/min Pain: denies    Therapy/Group: Individual Therapy  Lorie Phenix 03/09/2021, 4:33 PM

## 2021-03-10 NOTE — Progress Notes (Signed)
PROGRESS NOTE   Subjective/Complaints:   Pt told son- per son, that she's dizzy/feels drunk- no matter what position.  Thinks it's due to "all the pills".    ROS-limited due to aphasia Objective:   No results found. Recent Labs    03/08/21 0532  WBC 11.0*  HGB 11.1*  HCT 34.7*  PLT 319   Recent Labs    03/08/21 0532  NA 138  K 3.9  CL 97*  CO2 32  GLUCOSE 93  BUN 23  CREATININE 1.07*  CALCIUM 8.9    Intake/Output Summary (Last 24 hours) at 03/10/2021 1707 Last data filed at 03/10/2021 1300 Gross per 24 hour  Intake 358 ml  Output --  Net 358 ml        Physical Exam: Vital Signs Blood pressure (!) 112/55, pulse (!) 54, temperature 98.1 F (36.7 C), resp. rate 18, SpO2 97 %.   General: awake, alert, appropriate,sitting up in chair at bedside; family in room;  NAD HENT: conjugate gaze; oropharynx moist CV: bradycardic rate; no JVD Pulmonary: CTA B/L; no W/R/R- good air movement- O2 via [place]- home O2 GI: soft, NT, ND, (+)BS Psychiatric: appropriate Neurological: mild to moderate global aphasia and HOH  Extremities: No clubbing, cyanosis, or edema Skin: No evidence of breakdown, no evidence of rash Neurologic: Cranial nerves II through XII intact, motor strength is 5/5 in bilateral deltoid, bicep, tricep, grip, hip flexor, knee extensors, ankle dorsiflexor and plantar flexor Sensory exam unable to assess due to aphasia Unable to follow simple commands without gestural cues, has occ spont phrase level expression "I hate these britches" Musculoskeletal: Full range of motion in all 4 extremities. No joint swelling    Assessment/Plan: 1. Functional deficits which require 3+ hours per day of interdisciplinary therapy in a comprehensive inpatient rehab setting. Physiatrist is providing close team supervision and 24 hour management of active medical problems listed below. Physiatrist and rehab team  continue to assess barriers to discharge/monitor patient progress toward functional and medical goals  Care Tool:  Bathing    Body parts bathed by patient: Right arm, Left arm, Chest, Abdomen, Front perineal area, Right upper leg, Buttocks, Left upper leg, Right lower leg, Face, Left lower leg         Bathing assist Assist Level: Contact Guard/Touching assist     Upper Body Dressing/Undressing Upper body dressing   What is the patient wearing?: Pull over shirt (for 02 mgt)    Upper body assist Assist Level: Supervision/Verbal cueing    Lower Body Dressing/Undressing Lower body dressing      What is the patient wearing?: Underwear/pull up, Pants     Lower body assist Assist for lower body dressing: Contact Guard/Touching assist     Toileting Toileting    Toileting assist Assist for toileting: Supervision/Verbal cueing     Transfers Chair/bed transfer  Transfers assist     Chair/bed transfer assist level: Minimal Assistance - Patient > 75% Chair/bed transfer assistive device: Geologist, engineering   Ambulation assist      Assist level: Minimal Assistance - Patient > 75% Assistive device: Walker-rolling Max distance: 100 ft   Walk 10 feet activity  Assist     Assist level: Contact Guard/Touching assist Assistive device: Walker-rolling   Walk 50 feet activity   Assist    Assist level: Contact Guard/Touching assist Assistive device: Walker-rolling    Walk 150 feet activity   Assist Walk 150 feet activity did not occur: Safety/medical concerns         Walk 10 feet on uneven surface  activity   Assist Walk 10 feet on uneven surfaces activity did not occur: Safety/medical concerns         Wheelchair     Assist Is the patient using a wheelchair?: Yes Type of Wheelchair: Manual    Wheelchair assist level: Total Assistance - Patient < 25% Max wheelchair distance: 300 ft    Wheelchair 50 feet with 2 turns  activity    Assist        Assist Level: Total Assistance - Patient < 25%   Wheelchair 150 feet activity     Assist      Assist Level: Total Assistance - Patient < 25%   Blood pressure (!) 112/55, pulse (!) 54, temperature 98.1 F (36.7 C), resp. rate 18, SpO2 97 %.  Medical Problem List and Plan: 1.   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  Antithrombotics: -DVT/anticoagulation:  Pharmaceutical: Other (comment) Eliquis             -antiplatelet therapy: N/A 3. Pain Management: Tylenol as needed 4. Mood: Provide emotional support             -antipsychotic agents: N/A 5. Neuropsych: This patient is not capable of making decisions on her own behalf. 6. Skin/Wound Care: Routine skin checks 7. Fluids/Electrolytes/Nutrition: Routine in and outs with follow-up chemistries 8.  Atrial fibrillation with RVR.  Status post cardioversion.  Follow-up cardiology services.  Amiodarone 200 mg  daily, verapamil 40 mg every 8 hours. 9.  Hypotension.  Currently on ProAmatine 10 mg 3 times daily. Vitals:   03/10/21 0506 03/10/21 1520  BP: (!) 104/51 (!) 112/55  Pulse: 62 (!) 54  Resp: 14 18  Temp: 98.4 F (36.9 C) 98.1 F (36.7 C)  SpO2: 96% 97%   HR and BP controlled cont current meds   10/30- HR is in 50s and BP also soft- and c/o dizzy/lightheadedness- will d/w primary physician- see if any other options for low BP/bradycardia.   10.  Hyperlipidemia.  Lipitor 11.  History of COPD.  Monitor oxygen saturations.  Continue nebulizers  10/30- on home O2 regularly- con't reegimen 12.  Leukocytosis.  Chest x-ray negative.  Completed course of doxycycline  13.  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

## 2021-03-11 LAB — BASIC METABOLIC PANEL
Anion gap: 10 (ref 5–15)
BUN: 21 mg/dL (ref 8–23)
CO2: 30 mmol/L (ref 22–32)
Calcium: 9.2 mg/dL (ref 8.9–10.3)
Chloride: 99 mmol/L (ref 98–111)
Creatinine, Ser: 1.29 mg/dL — ABNORMAL HIGH (ref 0.44–1.00)
GFR, Estimated: 43 mL/min — ABNORMAL LOW (ref >60)
Glucose, Bld: 109 mg/dL — ABNORMAL HIGH (ref 70–99)
Potassium: 4.3 mmol/L (ref 3.5–5.1)
Sodium: 139 mmol/L (ref 135–145)

## 2021-03-11 MED ORDER — VERAPAMIL HCL 40 MG PO TABS
40.0000 mg | ORAL_TABLET | Freq: Two times a day (BID) | ORAL | Status: DC
  Administered 2021-03-11 – 2021-03-16 (×10): 40 mg via ORAL
  Filled 2021-03-11 (×12): qty 1

## 2021-03-11 NOTE — Progress Notes (Signed)
Physical Therapy Session Note  Patient Details  Name: Michelle Dean MRN: [id number] Date of Birth: [birth year]  Today's Date: 03/11/2021 PT Individual Time: 0803-0905 and 1132-1200 PT Individual Time Calculation (min): 62 min and 28 min  Short Term Goals: Week 1:  PT Short Term Goal 1 (Week 1): Pt will perform bed mobility with supervision and no cues for technique or initiation. PT Short Term Goal 2 (Week 1): pt will perform sit<>stand transfers to no AD with supervision and min cues for technique PT Short Term Goal 3 (Week 1): Pt will perform stand pivot transfers with no AD and supervision requiring min cues for technique PT Short Term Goal 4 (Week 1): Pt will consistently ambulate at least 100 ft with close supervision using RW without drop in SpO2 below 94%. PT Short Term Goal 5 (Week 1): Pt will perform dynamic standing activities without AD with CGA and min cues.  Skilled Therapeutic Interventions/Progress Updates:  Session 1: Patient seated EOB on entrance to room. NT present and preparing pt for ambulatory toilet transfer. Patient alert and agreeable to PT session.   Patient with no pain complaint throughout session.  Therapeutic Activity: Bed Mobility: Patient performed supine --> sit with supervision. VC/ tc required for initiation.  Transfers: Patient performed sit<>stand and stand pivot transfers throughout session with close and distant supervision. Provided verbal cues for positioning and locking of brakes on w/c d/t pt's inexperience with w/c use. Toilet transfer performed with distant supervision and pericare with setup as she is provided with warm washcloth for cleansing on request.   MD arrives for morning rounding and pt relates feeling of "being drunk". Unable to answer questions re: when feeling started, when she feels it, and other particulars of pt's feelings. MD requests orthostatic BP this day. Pt taken to gym and completed sitting/ standing BP. Results below.    Gait Training:  Patient ambulated in-room distances without AD and close supervision.  Demonstrated increased bilateral lean with ambulation that was most likely evident pta. Provided vc/ tc for directionality.  Patient seated upright in recliner at end of session with brakes locked, belt alarm set, and all needs within reach.  Session 2:  Patient seated upright in w/c on entrance to room. Patient alert and agreeable to PT session.   Patient with no pain complaint throughout session, but continues to express concern re: feeling of "being drunk" and believes she is taking too many medications.   Therapeutic Activity: Transfers: Patient performed sit<>stand and stand pivot transfers throughout session with close supervision. Provided verbal cues for safe brake application prior to transfers.  Neuromuscular Re-ed: NMR facilitated during session with focus on dynamic standing balance. Pt guided in forward minilunges performed bilaterally with bil HHA. X10 Bil. Then progressed to functional use with reach to items on tray table requiring forward step and reach with one hand to grasp item with no UE support. Pt able to perform with one minor wobble in balance and ability to maintain balance. No complete LOB throughout with good ankle strategy noted.  NMR performed for improvements in motor control and coordination, balance, sequencing, judgement, and self confidence/ efficacy in performing all aspects of mobility at highest level of independence.   Patient seated upright in recliner at end of session with brakes locked, belt alarm set, and all needs within reach.   Therapy Documentation Precautions:  Precautions Precautions: Fall Precaution Comments: receptive>expressive aphasia, 2L O2 via [place] if SpO2 <94%, TEDs daily Restrictions Weight Bearing Restrictions: No General:  Vital Signs: (Session 1) Orthostatic Sitting: BP: 120/54, Pulse: 56 Orthostatic Standing at 0 min: BP: 106/55, Pulse:  124 Orthostatic Standing at 3 min: BP: 117/ 55, Pulse: 55 Orthostatic Return to Sitting: BP: 130/ 61, Pulse: 56 Pain: Pain Assessment Pain Scale: 0-10 Pain Score: 0-No pain  Therapy/Group: Individual Therapy  Loel Dubonnet PT, DPT 03/11/2021, 10:14 AM

## 2021-03-11 NOTE — Progress Notes (Signed)
Occupational Therapy Session Note  Patient Details  Name: Michelle Dean MRN: [id number] Date of Birth: Oct 08, 1945  Today's Date: 03/11/2021 OT Individual Time: 1016-1059 OT Individual Time Calculation (min): 43 min    Short Term Goals: Week 1:  OT Short Term Goal 1 (Week 1): Pt will complete ambulatory toilet transfer with CGA OT Short Term Goal 2 (Week 1): Pt will complete grooming tasks while standing at the sink with supervision for balance OT Short Term Goal 3 (Week 1): Pt will complete shower transfer with no more than CGA  Skilled Therapeutic Interventions/Progress Updates:    Session 1 (4098-1191) No report of pain at start of session.  Pt in recliner to start, agreeable to working on shower and Optician, dispensing.  She declined donning clothing from her closet, even though she has pants and shirts.  Oxygen sats on 2Ls at rest were 96%.  She was able to ambulate to the shower with supervision to begin session.  Mod demonstrational cueing needed to recall the need to remove her gripper socks secondary to shower.  Pt with moderate receptive difficulty understanding therapist when he would try to just verbally tell her to remove them.  She was able to complete bathing at supervision level sit to stand with min instructional cueing for washing her feet.  Dyspnea 2/4 during task.  She was able to dry sit to stand and transfer out to the 3:1 over the toilet to rest.  Oxygen sats 94% on 2Ls which was kept on during shower.  She was able to then donn a hospital gown with setup and then gripper socks at the same level.  She ambulated out to the recliner to rest before ambulating to the sink for oral hygiene.  Supervision to complete brushing her teeth in standing with rest break taken sitting on the EOB prior to ambulation down the hallway.  Pt was able to walk to the nurses station and back without assistive device at supervision level.  Dyspnea 3/4 post mobility on 2Ls but sats still greater than 92% when  checked.  Finished session with pt in the recliner and with the call button and phone in reach.     Session 2: (4782-9562)  No report of pain during session.  She began with completion of toilet transfer and toileting tasks with supervision and no assistive device.  She then ambulated to the sink and washed her hands at the same level.  Transfer back to the recliner to rest once this was completed with O2 sats at 96% on 2Ls.  Next, had her work on BUE strengthening exercises in standing with use of a 3 pound weight.  She completed 2 sets of 20 reps for elbow flexion and extension, 1 set of 15 reps for shoulder overhead press with 1 set of 10 reps for shoulder flexion.  Rest breaks completed between each set of approximately 1 minute with O2 sats at 95% on 2Ls.  Completed 2 sets of 1 min Zoom Ball activity in sitting to complete session and work on posterior shoulder strengthening and posture.  Finished session with pt in the recliner with the call button and phone in reach and safety alarm in place.    Therapy Documentation Precautions:  Precautions Precautions: Fall Precaution Comments: receptive>expressive aphasia, 2L O2 via [place] if SpO2 <94%, TEDs daily Restrictions Weight Bearing Restrictions: No   Pain: Pain Assessment Pain Scale: Faces Pain Score: 0-No pain ADL: See Care Tool Section for some details of mobility and selfcare  Therapy/Group: Individual Therapy  Anntoinette Haefele OTR/L 03/11/2021, 12:09 PM

## 2021-03-11 NOTE — Progress Notes (Signed)
PROGRESS NOTE   Subjective/Complaints: "Feels drunk" Pt Aphasic , cannot say onset , but di dnot complain of this on Friday  Up in [facility] with PT going to therapy   ROS-limited due to aphasia Objective:   No results found. No results for input(s): WBC, HGB, HCT, PLT in the last 72 hours.  No results for input(s): NA, K, CL, CO2, GLUCOSE, BUN, CREATININE, CALCIUM in the last 72 hours.   Intake/Output Summary (Last 24 hours) at 03/11/2021 0741 Last data filed at 03/10/2021 1845 Gross per 24 hour  Intake 338 ml  Output --  Net 338 ml         Physical Exam: Vital Signs Blood pressure 104/61, pulse (!) 58, temperature 98.2 F (36.8 C), temperature source Oral, resp. rate 16, SpO2 97 %.   General: No acute distress Mood and affect are appropriate Heart: Regular rate and rhythm no rubs murmurs or extra sounds Lungs: Clear to auscultation, breathing unlabored, no rales or wheezes Abdomen: Positive bowel sounds, soft nontender to palpation, nondistended   Extremities: No clubbing, cyanosis, or edema Skin: No evidence of breakdown, no evidence of rash Neurologic: Cranial nerves II through XII intact, motor strength is 5/5 in bilateral deltoid, bicep, tricep, grip, hip flexor, knee extensors, ankle dorsiflexor and plantar flexor Sensory exam unable to assess due to aphasia Unable to follow simple commands without gestural cues,  Musculoskeletal: Full range of motion in all 4 extremities. No joint swelling    Assessment/Plan: 1. Functional deficits which require 3+ hours per day of interdisciplinary therapy in a comprehensive inpatient rehab setting. Physiatrist is providing close team supervision and 24 hour management of active medical problems listed below. Physiatrist and rehab team continue to assess barriers to discharge/monitor patient progress toward functional and medical goals  Care Tool:  Bathing    Body  parts bathed by patient: Right arm, Left arm, Chest, Abdomen, Front perineal area, Right upper leg, Buttocks, Left upper leg, Right lower leg, Face, Left lower leg         Bathing assist Assist Level: Contact Guard/Touching assist     Upper Body Dressing/Undressing Upper body dressing   What is the patient wearing?: Pull over shirt (for 02 mgt)    Upper body assist Assist Level: Supervision/Verbal cueing    Lower Body Dressing/Undressing Lower body dressing      What is the patient wearing?: Underwear/pull up, Pants     Lower body assist Assist for lower body dressing: Contact Guard/Touching assist     Toileting Toileting    Toileting assist Assist for toileting: Supervision/Verbal cueing     Transfers Chair/bed transfer  Transfers assist     Chair/bed transfer assist level: Minimal Assistance - Patient > 75% Chair/bed transfer assistive device: Geologist, engineering   Ambulation assist      Assist level: Minimal Assistance - Patient > 75% Assistive device: Walker-rolling Max distance: 100 ft   Walk 10 feet activity   Assist     Assist level: Contact Guard/Touching assist Assistive device: Walker-rolling   Walk 50 feet activity   Assist    Assist level: Contact Guard/Touching assist Assistive device: Walker-rolling    Walk  150 feet activity   Assist Walk 150 feet activity did not occur: Safety/medical concerns         Walk 10 feet on uneven surface  activity   Assist Walk 10 feet on uneven surfaces activity did not occur: Safety/medical concerns         Wheelchair     Assist Is the patient using a wheelchair?: Yes Type of Wheelchair: Manual    Wheelchair assist level: Total Assistance - Patient < 25% Max wheelchair distance: 300 ft    Wheelchair 50 feet with 2 turns activity    Assist        Assist Level: Total Assistance - Patient < 25%   Wheelchair 150 feet activity     Assist       Assist Level: Total Assistance - Patient < 25%   Blood pressure 104/61, pulse (!) 58, temperature 98.2 F (36.8 C), temperature source Oral, resp. rate 16, SpO2 97 %.  Medical Problem List and Plan: 1.   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  Antithrombotics: -DVT/anticoagulation:  Pharmaceutical: Other (comment) Eliquis             -antiplatelet therapy: N/A 3. Pain Management: Tylenol as needed 4. Mood: Provide emotional support             -antipsychotic agents: N/A 5. Neuropsych: This patient is not capable of making decisions on her own behalf. 6. Skin/Wound Care: Routine skin checks 7. Fluids/Electrolytes/Nutrition: Routine in and outs with follow-up chemistries 8.  Atrial fibrillation with RVR.  Status post cardioversion.  Follow-up cardiology services.  Amiodarone 200 mg  daily, verapamil 40 mg every 8 hours. 9.  Hypotension.  Currently on ProAmatine 10 mg 3 times daily.hope to wean as BP allows  Vitals:   03/10/21 2111 03/11/21 0526  BP: (!) 103/54 104/61  Pulse: 60 (!) 58  Resp:  16  Temp:  98.2 F (36.8 C)  SpO2:  97%   HR and BP controlled but having some diziness and has been on Proamatine, HR on low side reduce [place] to q 12h   But occ dizziness , check ortho vitals CHeck BMET  10.  Hyperlipidemia.  Lipitor 11.  History of COPD.  Monitor oxygen saturations.  Continue nebulizers  10/30- on home O2 regularly- con't reegimen 12.  Leukocytosis.  Chest x-ray negative.  Completed course of doxycycline  13.  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

## 2021-03-11 NOTE — Progress Notes (Signed)
Physical Therapy Session Note  Patient Details  Name: Michelle Dean MRN: [id number] Date of Birth: [birth year]  Today's Date: 03/11/2021 PT Individual Time: 1430-1500 PT Individual Time Calculation (min): 30 min   Short Term Goals: Week 1:  PT Short Term Goal 1 (Week 1): Pt will perform bed mobility with supervision and no cues for technique or initiation. PT Short Term Goal 2 (Week 1): pt will perform sit<>stand transfers to no AD with supervision and min cues for technique PT Short Term Goal 3 (Week 1): Pt will perform stand pivot transfers with no AD and supervision requiring min cues for technique PT Short Term Goal 4 (Week 1): Pt will consistently ambulate at least 100 ft with close supervision using RW without drop in SpO2 below 94%. PT Short Term Goal 5 (Week 1): Pt will perform dynamic standing activities without AD with CGA and min cues.  Skilled Therapeutic Interventions/Progress Updates:     Pt misses 15 minutes of skilled PT due to visiting with family upon PT arrival. PT follows up and pt is agreeable to therapy. No complaint of pain. Pt on 2L supplemental oxygen throughout session. Stand step transfer to [facility] with CGA and cues for sequencing and positioning. Pt ambulates 150'x2 without AD and with CGA and cues to maintain upright gaze to improve posture and balance, and increasing gait speed to decrease risk for falls. PT cues for pursed lip breathing between bouts to optimize oxygen sats. Pt then performs Nustep for strength and endurance training. Pt performs at workload of 4 with average steps per minute ~45. Pt completes total of 8:00 and requires 2 rest breaks. Pt then ambulates x200' back to room with CGA and cues for upright gaze and increased trunk rotation. Left seated in recliner with alarm intact and all needs within reach.  Therapy Documentation Precautions:  Precautions Precautions: Fall Precaution Comments: receptive>expressive aphasia, 2L O2 via  if SpO2 <94%,  TEDs daily Restrictions Weight Bearing Restrictions: No    Therapy/Group: Individual Therapy  Beau Fanny 03/11/2021, 2:53 PM

## 2021-03-11 NOTE — Care Management (Signed)
Inpatient Rehabilitation Center Individual Statement of Services  Patient Name:  Michelle Dean  Date:  03/11/2021  Welcome to the Inpatient Rehabilitation Center.  Our goal is to provide you with an individualized program based on your diagnosis and situation, designed to meet your specific needs.  With this comprehensive rehabilitation program, you will be expected to participate in at least 3 hours of rehabilitation therapies Monday-Friday, with modified therapy programming on the weekends.  Your rehabilitation program will include the following services:  Physical Therapy (PT), Occupational Therapy (OT), Speech Therapy (ST), 24 hour per day rehabilitation nursing, Therapeutic Recreaction (TR), Psychology, Neuropsychology, Care Coordinator, Rehabilitation Medicine, Nutrition Services, Pharmacy Services, and Other  Weekly team conferences will be held on Wednesdays to discuss your progress.  Your Inpatient Rehabilitation Care Coordinator will talk with you frequently to get your input and to update you on team discussions.  Team conferences with you and your family in attendance may also be held.  Expected length of stay: 10-14 days    Overall anticipated outcome: Supervision  Depending on your progress and recovery, your program may change. Your Inpatient Rehabilitation Care Coordinator will coordinate services and will keep you informed of any changes. Your Inpatient Rehabilitation Care Coordinator's name and contact numbers are listed  below.  The following services may also be recommended but are not provided by the Inpatient Rehabilitation Center:  Driving Evaluations Home Health Rehabiltiation Services Outpatient Rehabilitation Services Vocational Rehabilitation   Arrangements will be made to provide these services after discharge if needed.  Arrangements include referral to agencies that provide these services.  Your insurance has been verified to be:  [facility] Medicare  Your primary  doctor is:  Not listed in system  Pertinent information will be shared with your doctor and your insurance company.  Inpatient Rehabilitation Care Coordinator:  Lavera Guise, [place] [phone] or [phone]  Information discussed with and copy given to patient by: Gretchen Short, 03/11/2021, 7:57 AM

## 2021-03-12 ENCOUNTER — Encounter (HOSPITAL_COMMUNITY): Payer: Self-pay | Admitting: Physical Medicine & Rehabilitation

## 2021-03-12 NOTE — Progress Notes (Signed)
Physical Therapy Session Note  Patient Details  Name: Michelle Dean MRN: [id number] Date of Birth: [birth year]  Today's Date: 03/12/2021 PT Individual Time: 7564-3329 and 5188-4166 PT Individual Time Calculation (min): 56 min and 42 min  Short Term Goals: Week 1:  PT Short Term Goal 1 (Week 1): Pt will perform bed mobility with supervision and no cues for technique or initiation. PT Short Term Goal 2 (Week 1): pt will perform sit<>stand transfers to no AD with supervision and min cues for technique PT Short Term Goal 3 (Week 1): Pt will perform stand pivot transfers with no AD and supervision requiring min cues for technique PT Short Term Goal 4 (Week 1): Pt will consistently ambulate at least 100 ft with close supervision using RW without drop in SpO2 below 94%. PT Short Term Goal 5 (Week 1): Pt will perform dynamic standing activities without AD with CGA and min cues.   Skilled Therapeutic Interventions/Progress Updates:  Session 1: Patient seated upright in w/c on entrance to room. Patient alert and agreeable to PT session.   Patient with no pain complaint throughout session. Continues to c/o "feeling drunk" and believes this is d/t taking so many medications all at once.   Therapeutic Activity: Transfers: Patient performed sit<>stand and stand pivot transfers throughout session with close/ distant supervision. Provided verbal cues for safety awareness with brake application prior to transfers.   Gait Training:  Patient ambulated 100 ft x2 through obstacle course with no AD and CGA. Pt guided onto/ off Airex pad, over 6" hurdles and around 4 tightly spaced cones. Demos good balance throughout until tight turns and performs one crossover step requiring step out strategy to maintain balance.   Neuromuscular Re-ed: NMR facilitated during session with focus on standing balance. Pt guided in stance on Airex pad with self perturbations using quick arm changes, addition of 2# weighted ball  in AP arm extensions as well as pot stirrers x10 in each direction. Performs well with no LOB. Progressed to stepping onto/ off of Airex pad leading with RLE , then LLE and alternating back and forth. NMR performed for improvements in motor control and coordination, balance, sequencing, judgement, and self confidence/ efficacy in performing all aspects of mobility at highest level of independence.   Patient seated upright in recliner at end of session with brakes locked, belt alarm set, and all needs within reach.    Session 2:  Patient seated upright in recliner and finishing lunch on entrance to room. Patient alert and agreeable to PT session.   Patient with no pain complaint throughout session.  Therapeutic Activity: Transfers: Patient performed sit<>stand and stand pivot transfers throughout session with distant supervision. No vc/ tc provided. Toilet transfer at start of session with Mod I and intermittent use of safety rail prn. Performed pericare with distant supervision. SpO2 on 2L via [place] at start of session at 99%. O2 removed and checked again after toileting at 95%. Session performed with no supplemental O2.   Gait Training:  Patient ambulated 125 ft using no AD with close supervision. Demonstrated lateral lean bilaterally throughout. Pt demonstrating increased respiratory rate around 90 feet and provided with vc/ tc for slowing pace in order to conserve energy and reach destination. On reaching goal, SpO2 checked with reading at 92% and rising to 94% within .   Therapeutic Exercise: Pt guided in continuous reciprocation of BUE and BLE using NuStep L2 x x3 bouts with rest breaks between. Focus on increasing activity tolerance and challenge  to cardiorespiratory system. Pt maintains ~2.0 METs and pace of ~47 steps/ min. SpO2 checked following each bout with SpO2 reading at 92%, 91% and 92%. All requiring rest to return to 94%.  Patient seated in recliner at end of session  with brakes locked, belt alarm set, and all needs within reach.   Therapy Documentation Precautions:  Precautions Precautions: Fall Precaution Comments: receptive>expressive aphasia, 2L O2 via [place] if SpO2 <94%, TEDs daily Restrictions Weight Bearing Restrictions: No General:    Pain: Pain Assessment Pain Scale: 0-10 Pain Score: 0-No pain  Therapy/Group: Individual Therapy  Loel Dubonnet PT, DPT 03/12/2021, 12:43 PM

## 2021-03-12 NOTE — Progress Notes (Signed)
PROGRESS NOTE   Subjective/Complaints:  No longer ""feeling drunk", per OT receptive skill partially improved   ROS-limited due to aphasia Objective:   No results found. No results for input(s): WBC, HGB, HCT, PLT in the last 72 hours.  Recent Labs    03/11/21 0920  NA 139  K 4.3  CL 99  CO2 30  GLUCOSE 109*  BUN 21  CREATININE 1.29*  CALCIUM 9.2     Intake/Output Summary (Last 24 hours) at 03/12/2021 0736 Last data filed at 03/11/2021 1813 Gross per 24 hour  Intake 368 ml  Output --  Net 368 ml         Physical Exam: Vital Signs Blood pressure (!) 102/50, pulse 63, temperature 97.7 F (36.5 C), resp. rate 18, height 5\' 7"  (1.702 m), weight 90.2 kg, SpO2 98 %.  General: No acute distress Mood and affect are appropriate Heart: Regular rate and rhythm no rubs murmurs or extra sounds Lungs: Clear to auscultation, breathing unlabored, no rales or wheezes Abdomen: Positive bowel sounds, soft nontender to palpation, nondistended  Extremities: No clubbing, cyanosis, or edema Skin: No evidence of breakdown, no evidence of rash Neurologic: Cranial nerves II through XII intact, motor strength is 5/5 in bilateral deltoid, bicep, tricep, grip, hip flexor, knee extensors, ankle dorsiflexor and plantar flexor Sensory exam unable to assess due to aphasia Still requiring gestural cues for body part ID as well as simple commands Musculoskeletal: Full range of motion in all 4 extremities. No joint swelling    Assessment/Plan: 1. Functional deficits which require 3+ hours per day of interdisciplinary therapy in a comprehensive inpatient rehab setting. Physiatrist is providing close team supervision and 24 hour management of active medical problems listed below. Physiatrist and rehab team continue to assess barriers to discharge/monitor patient progress toward functional and medical goals  Care Tool:  Bathing     Body parts bathed by patient: Right arm, Left arm, Chest, Abdomen, Front perineal area, Right upper leg, Buttocks, Left upper leg, Right lower leg, Face, Left lower leg         Bathing assist Assist Level: Supervision/Verbal cueing (shower)     Upper Body Dressing/Undressing Upper body dressing   What is the patient wearing?: Hospital gown only    Upper body assist Assist Level: Set up assist    Lower Body Dressing/Undressing Lower body dressing      What is the patient wearing?: Hospital gown only     Lower body assist Assist for lower body dressing: Set up assist     Toileting Toileting    Toileting assist Assist for toileting: Supervision/Verbal cueing     Transfers Chair/bed transfer  Transfers assist     Chair/bed transfer assist level: Minimal Assistance - Patient > 75% Chair/bed transfer assistive device: Programmer, multimedia   Ambulation assist      Assist level: Supervision/Verbal cueing Assistive device: No Device Max distance: 75'   Walk 10 feet activity   Assist     Assist level: Contact Guard/Touching assist Assistive device: Walker-rolling   Walk 50 feet activity   Assist    Assist level: Contact Guard/Touching assist Assistive device: Walker-rolling  Walk 150 feet activity   Assist Walk 150 feet activity did not occur: Safety/medical concerns         Walk 10 feet on uneven surface  activity   Assist Walk 10 feet on uneven surfaces activity did not occur: Safety/medical concerns         Wheelchair     Assist Is the patient using a wheelchair?: Yes Type of Wheelchair: Manual    Wheelchair assist level: Total Assistance - Patient < 25% Max wheelchair distance: 300 ft    Wheelchair 50 feet with 2 turns activity    Assist        Assist Level: Total Assistance - Patient < 25%   Wheelchair 150 feet activity     Assist      Assist Level: Total Assistance - Patient < 25%    Blood pressure (!) 102/50, pulse 63, temperature 97.7 F (36.5 C), resp. rate 18, height 5\' 7"  (1.702 m), weight 90.2 kg, SpO2 98 %.  Medical Problem List and Plan: 1.   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  Antithrombotics: -DVT/anticoagulation:  Pharmaceutical: Other (comment) Eliquis             -antiplatelet therapy: N/A 3. Pain Management: Tylenol as needed 4. Mood: Provide emotional support             -antipsychotic agents: N/A 5. Neuropsych: This patient is not capable of making decisions on her own behalf. 6. Skin/Wound Care: Routine skin checks 7. Fluids/Electrolytes/Nutrition: Routine in and outs with follow-up chemistries 8.  Atrial fibrillation with RVR.  Status post cardioversion.  Follow-up cardiology services.  Amiodarone 200 mg  daily, verapamil 40 mg every 8 hours. 9.  Hypotension.  Currently on ProAmatine 10 mg 3 times daily.hope to wean as BP allows  Vitals:   03/12/21 0203 03/12/21 0528  BP: (!) 132/50 (!) 102/50  Pulse: 66 63  Resp: 20 18  Temp: 98.4 F (36.9 C) 97.7 F (36.5 C)  SpO2:  98%   HR and BP controlled but having some diziness and has been on Proamatine, HR on low side reduce [place] to q 12h   Dizziness improved  BMET mild increase in creat o/w nl 10.  Hyperlipidemia.  Lipitor 11.  History of COPD.  Monitor oxygen saturations.  Continue nebulizers  10/30- on home O2 regularly- con't reegimen 12.  Leukocytosis.  Chest x-ray negative.  Completed course of doxycycline  13.  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

## 2021-03-12 NOTE — Progress Notes (Signed)
Occupational Therapy Session Note  Patient Details  Name: Michelle Dean MRN: [id number] Date of Birth: 1946/01/06  Today's Date: 03/12/2021 OT Individual Time: 7829-5621 OT Individual Time Calculation (min): 58 min    Short Term Goals: Week 1:  OT Short Term Goal 1 (Week 1): Pt will complete ambulatory toilet transfer with CGA OT Short Term Goal 2 (Week 1): Pt will complete grooming tasks while standing at the sink with supervision for balance OT Short Term Goal 3 (Week 1): Pt will complete shower transfer with no more than CGA  Skilled Therapeutic Interventions/Progress Updates:    Pt received seated in recliner, no c/o pain, agreeable to therapy. Session focus on self-care retraining, activity tolerance, dynamic standing balance, functional transfers in prep for improved ADL/IADL/func mobility performance + decreased caregiver burden. Endorses "nausea" after "eating breakfast too quickly." Donned Teds and B socks with total A while seated. BP in sitting read at 113/53 (69), in standing at 0 minutes read at 84/60 (68). Endorses dizziness. After seated rest break and activity, BP recovered to 120/93 (101).   Declined ADL. Donned house coat with S. Short ambulatory transfers throughout session with close S due to intermittent feelings of nausea and no AD, able to hold water cup while ambulating with no spillage.   Played several rounds of corn hole while standing. Required only min visual and verbal cues for novel game to participate appropriately in game play. Able to tally each bag on board, but unable to tally up from previous rounds when verbally instructed.  Copied 2 level 2 difficulty designs on peg board; the first design pt unable to accurately complete despite max visual and direct verbal cues to copy design picture. Did complete second design with min A for 100% accuracy from picture. Overall, receptively pt requiring less cues than previous session with this therapist for novel tasks  and able to accurately answer more biographical/conversation questions.   MD in/out morning assessment.  Pt left seated in recliner with safety belt alarm engaged, call bell in reach, and all immediate needs met.    Therapy Documentation Precautions:  Precautions Precautions: Fall Precaution Comments: receptive>expressive aphasia, 2L O2 via [place] if SpO2 <94%, TEDs daily Restrictions Weight Bearing Restrictions: No  Pain: c/o discomfort upon initial donning of Teds, did not rate and no further c/o later in session ADL: See Care Tool for more details.    Therapy/Group: Individual Therapy  Volanda Napoleon MS, OTR/L  03/12/2021, 6:41 AM

## 2021-03-12 NOTE — Progress Notes (Signed)
Speech Language Pathology Daily Session Note  Patient Details  Name: Michelle Dean MRN: [id number] Date of Birth: 1945/10/04  Today's Date: 03/12/2021 SLP Individual Time: 0930-1000 SLP Individual Time Calculation (min): 30 min and Today's Date: 03/12/2021 SLP Missed Time: 15 Minutes Missed Time Reason: Other (Comment) (scheduling conflict)  Short Term Goals: Week 1: SLP Short Term Goal 1 (Week 1): Patient will follow verbal one-step commands (point to nose, etc) with maxA verbal, visual/modeling/gestural cues. SLP Short Term Goal 2 (Week 1): Patient will name common objects and/or object pictures with 80% accuracy and modA verbal cues. SLP Short Term Goal 3 (Week 1): Patient will be able to point to identify alphabet letters and single digit numbers in field of 2-3, with modA verbal cues. SLP Short Term Goal 4 (Week 1): Patient will be able to answer basic level What and Where questions with modA verbal and visual cues. SLP Short Term Goal 5 (Week 1): Patient will demonstrate ability to answer basic biographical yes/no questions with modA verbal cues.  Skilled Therapeutic Interventions: Pt received upright in recliner chair and agreeable to skilled ST intervention with focus on language goals. SLP facilitated session by providing mod-to-max A verbal/visual cues for object naming using common items in patient's room. Pt achieved 20% accuracy without cues, 35% accuracy with min A verbal (phonemic cue), and 75% accuracy given mod-to-max A (phonemic + written initial letter + written word). Pt did not respond to auditory cloze phrase/sentence completion cues. Pt with frequent semantic paraphasias (e.g., curtain for mirror) and only appeared aware of errors approximately 25% of occasions. Following SLP correction, she would often express "yeah, that's what I said". SLP wrote down corrections on dry erase board to enhance comprehension. Pt missed 15 minutes of skilled ST intervention due to scheduling  conflict. Patient was left in chair with alarm activated and immediate needs within reach at end of session. Continue per current plan of care.      Pain Pain Assessment Pain Scale: 0-10 Pain Score: 0-No pain  Therapy/Group: Individual Therapy  Michelle Dean 03/12/2021, 11:19 AM

## 2021-03-13 MED ORDER — MIDODRINE HCL 5 MG PO TABS
2.5000 mg | ORAL_TABLET | Freq: Three times a day (TID) | ORAL | Status: DC
  Administered 2021-03-13 – 2021-03-15 (×6): 2.5 mg via ORAL
  Filled 2021-03-13 (×6): qty 1

## 2021-03-13 MED ORDER — PANTOPRAZOLE SODIUM 20 MG PO TBEC
20.0000 mg | DELAYED_RELEASE_TABLET | Freq: Every day | ORAL | Status: DC
  Administered 2021-03-13 – 2021-03-19 (×7): 20 mg via ORAL
  Filled 2021-03-13 (×7): qty 1

## 2021-03-13 NOTE — Progress Notes (Signed)
Physical Therapy Session Note  Patient Details  Name: Michelle Dean MRN: [id number] Date of Birth: 09/29/1945  Today's Date: 03/13/2021 PT Individual Time: 1303-1400 PT Individual Time Calculation (min): 57 min   Short Term Goals: Week 1:  PT Short Term Goal 1 (Week 1): Pt will perform bed mobility with supervision and no cues for technique or initiation. PT Short Term Goal 2 (Week 1): pt will perform sit<>stand transfers to no AD with supervision and min cues for technique PT Short Term Goal 3 (Week 1): Pt will perform stand pivot transfers with no AD and supervision requiring min cues for technique PT Short Term Goal 4 (Week 1): Pt will consistently ambulate at least 100 ft with close supervision using RW without drop in SpO2 below 94%. PT Short Term Goal 5 (Week 1): Pt will perform dynamic standing activities without AD with CGA and min cues.  Skilled Therapeutic Interventions/Progress Updates:   Pt received sitting in WC and agreeable to PT. Pt performed gait training through rehab unit and hall of hospital 3 x ~200ft with no AD and supervision assist for safety. Pt also performed gait training on unlevel cement sidewalk and up/down handicap ramp with CGA-supervision A* with cues for direction and safety in turns 4 x ~250ft.  Pt engaged in visual scanning/balance task of BIT object location and sequencing. Initially required only min cues for alphabet letter identification x 2 bouts but fading to ma assist beyond letter E.  Shap identification x 2 bouts min assist initially to prevent mixing words but then fading to mod assist with fatigue.   Patient returned to room and performed stand pivot to recliner with supervision assist and no AD. Pt left sitting in recliner with call bell in reach and all needs met.         Therapy Documentation Precautions:  Precautions Precautions: Fall Precaution Comments: receptive>expressive aphasia, 2L O2 via [place] if SpO2 <94%, TEDs  daily Restrictions Weight Bearing Restrictions: No   Pain:   denies     Therapy/Group: Individual Therapy  Austin E Tucker 03/13/2021, 2:01 PM  

## 2021-03-13 NOTE — Patient Care Conference (Signed)
Inpatient RehabilitationTeam Conference and Plan of Care Update Date: 03/13/2021   Time: 1:50 PM 10:14 AM  Patient Name: Michelle Dean      Medical Record Number: [id number]  Date of Birth: 1946-01-12 Sex: Female         Room/Bed: 5C06C/5C06C-01 Payor Info: Payor: [employer] MEDICARE / Plan: [facility] MEDICARE / Product Type: *No Product type* /    Admit Date/Time:  03/07/2021  3:45 PM  Primary Diagnosis:  Left middle cerebral artery stroke [facility])  Hospital Problems: Principal Problem:   Left middle cerebral artery stroke [facility])    Expected Discharge Date: Expected Discharge Date: 03/19/21  Team Members Present: Physician leading conference: Dr. Claudette Laws Social Worker Present: Lavera Guise, BSW Nurse Present: Chana Bode, RN PT Present: Grier Rocher, PT OT Present: Annye English, OT SLP Present: Eilene Ghazi, SLP PPS Coordinator present : Fae Pippin, SLP     Current Status/Progress Goal Weekly Team Focus  Bowel/Bladder   Pt is continent of bowel/bladder  Pt will remain continent of bowel/bladder  Will assess qshift and PRN   Swallow/Nutrition/ Hydration             ADL's   S for grooming, UB dressing, full-body bathing at shower level, LB dressing although primarily wears gowns only, S to CGA for ambulatory bathroom transfers with no AD  S overall  functional cognition, self-care/balance/transfer retraining, pt/family education, DME education   Mobility   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   Communication   mod-to-max A receptive, min-to-mod A expressive  modA receptive, minA  expressive language  multimodal communication, following one-step verbal commands, yes/no questions, objetc naming   Safety/Cognition/ Behavioral Observations  difficult to assess 2/2 aphasia min-to-mod A  sup A  basic command following/problem solving, awareness   Pain   Pt is currently pain free  Pt will remain pain free  Will assess qshift and PRN   Skin   Pt's skin is intact  Pt's skin will remain intact  Will assess qshift and PRN     Discharge Planning:  D/c home with son and other family providing 24/7 supervision   Team Discussion: Orthostasis improved per MD. GERD addressed Patient on target to meet rehab goals: yes, currently supervision for grooming and toileting. Needs cues for safety awareness especially in the kitchen. Has trouble following step-by-step instructions and recipes; will need help with cooking and medication management. Appears to do ok with novel tasks/instructions. Progress limited by poor safety awareness, processing issues and error awareness. Near goal level for transfers. Note minor loss of balance without and assistive device/dizziness that patient brushes off. Nees min - mod assist for expression and mod - max assist for receptive aphasia issues. Goals for discharge set for supervision overall.  *See Care Plan and progress notes for long and short-term goals.   Revisions to Treatment Plan:  none  Teaching Needs: Safety, medication management, secondary risk management, toileting, transfers, etc  Current Barriers to Discharge: Home enviroment access/layout and Lack of/limited family support  Possible Resolutions to Barriers: Family education with son Recommend 24/7 assistance/supervision     Medical Summary Current Status: labile BP, orthostasis improved, + GERD, aphsia still severe  Barriers  to Discharge: Medical stability   Possible Resolutions to Barriers/Weekly Focus: BP management , may need cardiology input   Continued Need for Acute  Rehabilitation Level of Care: The patient requires daily medical management by a physician with specialized training in physical medicine and rehabilitation for the following reasons: Direction of a multidisciplinary physical rehabilitation program to maximize functional independence : Yes Medical management of patient stability for increased activity during participation in an intensive rehabilitation regime.: Yes Analysis of laboratory values and/or radiology reports with any subsequent need for medication adjustment and/or medical intervention. : Yes   I attest that I was present, lead the team conference, and concur with the assessment and plan of the team.   Dorien Chihuahua B 03/13/2021, 1:50 PM

## 2021-03-13 NOTE — Progress Notes (Signed)
Physical Therapy Session Note  Patient Details  Name: Michelle Dean MRN: [id number] Date of Birth: February 16, 1946  Today's Date: 03/13/2021 PT Individual Time: 1003-1029 PT Individual Time Calculation (min): 26 min   Short Term Goals: Week 1:  PT Short Term Goal 1 (Week 1): Pt will perform bed mobility with supervision and no cues for technique or initiation. PT Short Term Goal 2 (Week 1): pt will perform sit<>stand transfers to no AD with supervision and min cues for technique PT Short Term Goal 3 (Week 1): Pt will perform stand pivot transfers with no AD and supervision requiring min cues for technique PT Short Term Goal 4 (Week 1): Pt will consistently ambulate at least 100 ft with close supervision using RW without drop in SpO2 below 94%. PT Short Term Goal 5 (Week 1): Pt will perform dynamic standing activities without AD with CGA and min cues.  Skilled Therapeutic Interventions/Progress Updates:    Pt received sitting in recliner and agreeable to therapy session with min encouragement due to pt reporting having just finished with previous therapy session. Pt very HOH and would benefit from use of clear face mask to improve communication with therapist. Pt received and maintained on 2L of O2 via nasal cannula throughout session. Vitals at beginning of session: BP 110/60 (MAP 77), HR 58bpm, SpO2 97%  Sit<>stands, no AD, with CGA for safety but no unsteadiness noted. Gait training, no AD, ~114ft to therapy gym in room 5C17 with CGA for steadying and pt requiring 1x standing rest break due to labored breathing from exertion and impaired endurance - assessed SpO2 96% and HR raging 40-66bpm.  Reassessed BP 120/61 (MAP 79). Dynamic standing balance task via alternate B LE foot taps on 3" step, no UE support, with light min assist for steadying due to slight L lean. Dynamic gait training side stepping R/L 3x77ft with CGA for steadying. Pt requires frequent seated rest breaks due to labored breathing with  activity but SpO2 maintaining >93%. Gait training ~155ft back to room, no AD, as described above but pt did not require standing break. Pt left seated in recliner with needs in reach and seat belt alarm on.  Therapy Documentation Precautions:  Precautions Precautions: Fall Precaution Comments: receptive>expressive aphasia, 2L O2 via [place] if SpO2 <94%, TEDs daily Restrictions Weight Bearing Restrictions: No   Pain:  No reports of pain throughout session.    Therapy/Group: Individual Therapy  Ginny Forth , PT, DPT, NCS, CSRS 03/13/2021, 7:53 AM

## 2021-03-13 NOTE — Progress Notes (Signed)
Occupational Therapy Session Note  Patient Details  Name: Michelle Dean MRN: [id number] Date of Birth: 03/30/1946  Today's Date: 03/13/2021 OT Individual Time: 0814-4818 OT Individual Time Calculation (min): 53 min    Short Term Goals: Week 1:  OT Short Term Goal 1 (Week 1): Pt will complete ambulatory toilet transfer with CGA OT Short Term Goal 2 (Week 1): Pt will complete grooming tasks while standing at the sink with supervision for balance OT Short Term Goal 3 (Week 1): Pt will complete shower transfer with no more than CGA  Skilled Therapeutic Interventions/Progress Updates:  Pt greeted seated in recliner agreeable to OT intervention. Session focus on BADL reeducation, dynamic standing balance, and problem solving via therapeutic activities to faciliate improved safety awareness during ADL participation. Pt request to take a shower, supervision for ambulatory transfer from recliner>TTB with no AD. MOD verbal cues for safety awareness as pt attempting to shower with clothes on and trying to step with one foot on threshold to enter shower. Pt completed bathing with overall supervision able to sit<>stand using grab bar to wash buttock. Pt on 2L [place] during session although initially declining O2. Pt exited shower with MIN A for safety, ambulatory transfer back to recliner with CGA for safety and no AD. Pt completed dressing from recliner with pt requesting to don gown. S/u assist to don hospital gown, MAX A to don TEDs and socks. Remainder of session to focus on dynamic standing balance and problem solving skills via connect 4. Pt needed MAX cues to problem solve object of the game. Pt initially unaware of pt winning first round. Needed MAX demonstrative cues, and backwards chaining cues to problem solve through how to win the game. Pt continues to present with aphasia often having difficulty word finding. Pt presents with working memory deficits noted to need cues to recall directions of game  provided as pt with difficulty holding onto instructions for a limited amount of time. pt presents with impaired mental flexibility having a hard time adapting or shifting her mindset based on events that occurred during the game such as this OTA blocking her "3 in a row."  Pt completed a total of 4 games all in standing with gross supervision for balance.   pt left  seated in recliner with all needs within reach and alarm belt activated.   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

## 2021-03-13 NOTE — Progress Notes (Signed)
PROGRESS NOTE   Subjective/Complaints:  C/o heartburn after drinking OJ, has had this problem at home prior to CVA , PCP prescribed a pill for this.  Remains aphasic .  On O2 per pt report at home  PCP note 12/17/20 listed omeprazole  ROS-limited due to aphasia Objective:   No results found. No results for input(s): WBC, HGB, HCT, PLT in the last 72 hours.  Recent Labs    03/11/21 0920  NA 139  K 4.3  CL 99  CO2 30  GLUCOSE 109*  BUN 21  CREATININE 1.29*  CALCIUM 9.2     Intake/Output Summary (Last 24 hours) at 03/13/2021 3704 Last data filed at 03/13/2021 0801 Gross per 24 hour  Intake 295 ml  Output --  Net 295 ml         Physical Exam: Vital Signs Blood pressure (!) 118/47, pulse 61, temperature 97.7 F (36.5 C), temperature source Oral, resp. rate 16, height 5' 7" (1.702 m), weight 90.2 kg, SpO2 98 %.  General: No acute distress Mood and affect are appropriate Heart: Regular rate and rhythm no rubs murmurs or extra sounds Lungs: Clear to auscultation, breathing unlabored, no rales or wheezes Abdomen: Positive bowel sounds, soft nontender to palpation, nondistended Extremities: No clubbing, cyanosis, or edema Skin: No evidence of breakdown, no evidence of rash Neurologic: Cranial nerves II through XII intact, motor strength is 5/5 in bilateral deltoid, bicep, tricep, grip, hip flexor, knee extensors, ankle dorsiflexor and plantar flexor Sensory exam normal sensation to light touch and proprioception in bilateral upper and lower extremities  Neurologic: Cranial nerves II through XII intact, motor strength is 5/5 in bilateral deltoid, bicep, tricep, grip, hip flexor, knee extensors, ankle dorsiflexor and plantar flexor Sensory exam unable to assess due to aphasia Still requiring gestural cues for body part ID as well as simple commands Musculoskeletal: Full range of motion in all 4 extremities. No joint  swelling    Assessment/Plan: 1. Functional deficits which require 3+ hours per day of interdisciplinary therapy in a comprehensive inpatient rehab setting. Physiatrist is providing close team supervision and 24 hour management of active medical problems listed below. Physiatrist and rehab team continue to assess barriers to discharge/monitor patient progress toward functional and medical goals  Care Tool:  Bathing    Body parts bathed by patient: Right arm, Left arm, Chest, Abdomen, Front perineal area, Right upper leg, Buttocks, Left upper leg, Right lower leg, Face, Left lower leg         Bathing assist Assist Level: Supervision/Verbal cueing (shower)     Upper Body Dressing/Undressing Upper body dressing   What is the patient wearing?: Hospital gown only    Upper body assist Assist Level: Set up assist    Lower Body Dressing/Undressing Lower body dressing      What is the patient wearing?: Hospital gown only     Lower body assist Assist for lower body dressing: Set up assist     Toileting Toileting    Toileting assist Assist for toileting: Supervision/Verbal cueing     Transfers Chair/bed transfer  Transfers assist     Chair/bed transfer assist level: Minimal Assistance - Patient > 75%  Chair/bed transfer assistive device: Museum/gallery exhibitions officer assist      Assist level: Supervision/Verbal cueing Assistive device: No Device Max distance: 75'   Walk 10 feet activity   Assist     Assist level: Contact Guard/Touching assist Assistive device: Walker-rolling   Walk 50 feet activity   Assist    Assist level: Contact Guard/Touching assist Assistive device: Walker-rolling    Walk 150 feet activity   Assist Walk 150 feet activity did not occur: Safety/medical concerns         Walk 10 feet on uneven surface  activity   Assist Walk 10 feet on uneven surfaces activity did not occur: Safety/medical concerns          Wheelchair     Assist Is the patient using a wheelchair?: Yes Type of Wheelchair: Manual    Wheelchair assist level: Total Assistance - Patient < 25% Max wheelchair distance: 300 ft    Wheelchair 50 feet with 2 turns activity    Assist        Assist Level: Total Assistance - Patient < 25%   Wheelchair 150 feet activity     Assist      Assist Level: Total Assistance - Patient < 25%   Blood pressure (!) 118/47, pulse 61, temperature 97.7 F (36.5 C), temperature source Oral, resp. rate 16, height 5' 7" (1.702 m), weight 90.2 kg, SpO2 98 %.  Medical Problem List and Plan: 1.   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 Formulized individual treatment plan based on medical history, underlying problem and comorbidities.  2.  Antithrombotics: -DVT/anticoagulation:  Pharmaceutical: Other (comment) Eliquis             -antiplatelet therapy: N/A 3. Pain Management: Tylenol as needed 4. Mood: Provide emotional support             -antipsychotic agents: N/A 5. Neuropsych: This patient is not capable of making decisions on her own behalf. 6. Skin/Wound Care: Routine skin checks 7. Fluids/Electrolytes/Nutrition: Routine in and outs with follow-up chemistries 8.  Atrial fibrillation with RVR.  Status post cardioversion.  Follow-up cardiology services.  Amiodarone 200 mg  daily, verapamil 40 mg every 8 hours. 9.  Hypotension.  Currently on ProAmatine10m 3 times daily.hope to wean as BP allows  Vitals:   03/12/21 1935 03/13/21 0535  BP: (!) 150/79 (!) 118/47  Pulse: 68 61  Resp: 20 16  Temp: 98.4 F (36.9 C) 97.7 F (36.5 C)   SpO2: 98% 98%   HR and BP controlled but having some diziness and has been on Proamatine, HR on low side reduce [place] to q 12h   Dizziness improved  BMET mild increase in creat o/w nl Reduce proamatine to 2.525mTID , was on Cozar at home which may need to be restarted if BP remains up , HR too low to increase [place]  10.  Hyperlipidemia.  Lipitor 11.  History of COPD.  Monitor oxygen saturations.  Continue nebulizers  10/30- on home O2 regularly- con't reegimen 12.  Leukocytosis.  Chest x-ray negative.  Completed  course of doxycycline  13.  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  Hx GERD restart PPI as pt with intermittent sx  LOS: 6 days A FACE TO FACE EVALUATION WAS PERFORMED  Andrew E Kirsteins 03/13/2021, 8:08 AM     

## 2021-03-13 NOTE — Progress Notes (Signed)
Occupational Therapy Session Note  Patient Details  Name: Michelle Dean MRN: [id number] Date of Birth: [birth year]  Today's Date: 03/13/2021 OT Individual Time: 0922-1000 OT Individual Time Calculation (min): 38 min + 8 min; 20 missed min due to fatigue   Short Term Goals: Week 1:  OT Short Term Goal 1 (Week 1): Pt will complete ambulatory toilet transfer with CGA OT Short Term Goal 2 (Week 1): Pt will complete grooming tasks while standing at the sink with supervision for balance OT Short Term Goal 3 (Week 1): Pt will complete shower transfer with no more than CGA  Skilled Therapeutic Interventions/Progress Updates:   Session 1 (6967-8938): Pt received seated in recliner, c/o ongoing "heart burn" but, agreeable to therapy. Session focus on self-care retraining, activity tolerance, energy conservation, path finding, cardiovascular endurance, BUE/BLE strengthening in prep for improved ADL/IADL/func mobility performance + decreased caregiver burden. Doffed socks with S and donned teds with total A. Completed ambulatory transfer to toilet with S and completed toilet tasks mod I post continent void of bladder. Washed hands standing at sink with S, as well. Ambulated throughout session with no AD and S to CGA due to fatigue/and needing cues for PLB post activity.   Completed total of 10 min on nustep at level 6/10 resistive with average step/min over 30. Required seated rest break at 5 min 30 secs. HR at 60 bpm to start, at 108 bpm post activity. Sat at 98% + on 2L via [place] throughout session.   Ambulated back to room with no further cues to locate room, but did require seated rest break halfway through due to fatigue/ cues for PLB.  Pt left seated in recliner with safety belt alarm engaged, call bell in reach, and all immediate needs met.    Session 2 [phone]): Pt received seated in recliner, upon presentation of portable O2, pt adamantly declining therapy due to fatigue, stating " I just sat  down, I walked all over with that man." Agreeable to toilet. SatO2 at 98% on 2L via [place]. Doffed [place] for ambulatory transfer to toilet with S, completed toileting tasks mod I post continent void of bladder. Ambulatory transfer to sink and back to recliner with S. Able to wash hands and redonn [place] with independence. Pt missed 20 min of OT due to fatigue. Pt left seated in recliner with safety belt alarm engaged, call bell in reach, and all immediate needs met.    Therapy Documentation Precautions:  Precautions Precautions: Fall Precaution Comments: receptive>expressive aphasia, 2L O2 via  if SpO2 <94%, TEDs daily Restrictions Weight Bearing Restrictions: No  Pain: c/o "heart burn" did not rate  ADL: See Care Tool for more details. Therapy/Group: Individual Therapy  Volanda Napoleon MS, OTR/L  03/13/2021, 6:39 AM

## 2021-03-13 NOTE — Progress Notes (Signed)
Patient ID: Michelle Dean, female   DOB: 12-26-1945, 75 y.o.   MRN: [id number] Team Conference Report to Patient/Family  Team Conference discussion was reviewed with the patient and caregiver, including goals, any changes in plan of care and target discharge date.  Patient and caregiver express understanding and are in agreement.  The patient has a target discharge date of 03/19/21.  SW met with patient and called son Marcelina Morel) at bedside. Pt son reports patient is ready to d/c home and has family to provide 24/7 supervision. SW informed pt son if he can attend family edu, patient will be ready for d/c by 11/8. Patient son in agreement plans to attend family education on Friday 11/4, 8-12. Son plans to provide SW with [facility] paperwork. E-mail and contact information provided.   Dyanne Iha 03/13/2021, 1:18 PM

## 2021-03-14 NOTE — Plan of Care (Signed)
  Problem: RH Expression Communication Goal: LTG Patient will verbally express basic/complex needs(SLP) Description: LTG:  Patient will verbally express basic/complex needs, wants or ideas with cues  (SLP) Flowsheets (Taken 03/14/2021 1159) LTG: Patient will verbally express basic/complex needs, wants or ideas (SLP):  Minimal Assistance - Patient > 75%  Moderate Assistance - Patient 50 - 74% Note: Goal downgraded due to slow progress Goal: LTG Patient will increase word finding of common (SLP) Description: LTG:  Patient will increase word finding of common objects/daily info/abstract thoughts with cues using compensatory strategies (SLP). Flowsheets (Taken 03/14/2021 1159) LTG: Patient will increase word finding of common (SLP):  Moderate Assistance - Patient 50 - 74%  Minimal Assistance - Patient > 75% Note: Goal downgraded due to slow progress   Problem: RH Awareness Goal: LTG: Patient will demonstrate awareness during functional activites type of (SLP) Description: LTG: Patient will demonstrate awareness during functional activites type of (SLP) Flowsheets (Taken 03/14/2021 1159) LTG: Patient will demonstrate awareness during cognitive/linguistic activities with assistance of (SLP): Moderate Assistance - Patient 50 - 74% Note: Goal downgraded due to slow progress

## 2021-03-14 NOTE — Progress Notes (Signed)
Speech Language Pathology Weekly Progress and Session Note  Patient Details  Name: Michelle Dean MRN: [id number] Date of Birth: 08/28/1945  Beginning of progress report period: March 08, 2021 End of progress report period: March 14, 2021  Today's Date: 03/14/2021 SLP Individual Time: 4315-4008 SLP Individual Time Calculation (min): 45 min  Short Term Goals: Week 1: SLP Short Term Goal 1 (Week 1): Patient will follow verbal one-step commands (point to nose, etc) with maxA verbal, visual/modeling/gestural cues. SLP Short Term Goal 1 - Progress (Week 1): Met SLP Short Term Goal 2 (Week 1): Patient will name common objects and/or object pictures with 80% accuracy and modA verbal cues. SLP Short Term Goal 2 - Progress (Week 1): Met SLP Short Term Goal 3 (Week 1): Patient will be able to point to identify alphabet letters and single digit numbers in field of 2-3, with modA verbal cues. SLP Short Term Goal 3 - Progress (Week 1): Not met SLP Short Term Goal 4 (Week 1): Patient will be able to answer basic level What and Where questions with modA verbal and visual cues. SLP Short Term Goal 4 - Progress (Week 1): Met SLP Short Term Goal 5 (Week 1): Patient will demonstrate ability to answer basic biographical yes/no questions with modA verbal cues. SLP Short Term Goal 5 - Progress (Week 1): Not met  New Short Term Goals: Week 2: SLP Short Term Goal 1 (Week 2): STG=LTG due to ELOS  Weekly Progress Updates: Patient has made slow progress with ST over the past week and has met 3 of 5 short term goals this reporting period. Patient is currently completing language tasks with mod-to-max A multimodal cues and continues to exhibit most difficulty with receptive language skills as compared to expressive language which impacts her ability to comprehend basic information and communicate functional needs. Patient/family education is ongoing. Recommend continued skilled ST intervention to maximize  expressive/receptive language function and functional independence prior to discharge. Recommend establishing 24 hour supervision at discharge due to severity of language deficits.    Intensity: Minumum of 1-2 x/day, 30 to 90 minutes Frequency: 3 to 5 out of 7 days Duration/Length of Stay: 11/8 Treatment/Interventions: Cognitive remediation/compensation;Speech/Language facilitation;Environmental controls;Patient/family education;Functional tasks  Daily Session Skilled Therapeutic Interventions: Pt received upright in chair and agreeable to skilled ST intervention with focus on language goals. SLP facilitated session by providing mod-to-max A for answering biographical yes/no questions to achieve 53% accuracy. Max A verbal/visual cues required for pointing to alphabet letters in field of 3 to achieve 42% accuracy, and mod-to-max A verbal cues in field of 2 to achieve 83% accuracy. Pt required mod-to-max A verbal/visual/written cues for following 1-step commands to achieve 60% accuracy. Pt with improved accuracy when pointing to body parts vs. environmental (e.g., point to the window). Pt named objects using LARK kit with mod-to-max A multimodal cues to achieve 80% accuracy, pt with 40% accuracy without cues. Pt exhibited frequent semantic paraphasias with minimal awareness or ability to self correct. Written word cues were most successful in repairing. Pt answered "what" questions on object function with mod A verbal/visual cues. Patient was left in chair with alarm activated and immediate needs within reach at end of session. Continue per current plan of care.      General    Pain Pain Assessment Pain Scale: 0-10 Pain Score: 0-No pain  Therapy/Group: Individual Therapy  Adiyah Lame T Clorine Swing 03/14/2021, 8:02 AM

## 2021-03-14 NOTE — Progress Notes (Signed)
Physical Therapy Session Note  Patient Details  Name: Rudi Knippenberg MRN: [id number] Date of Birth: 11/03/1945  Today's Date: 03/14/2021 PT Individual Time: 1305-1400 PT Individual Time Calculation (min): 55 min   Short Term Goals: Week 1:  PT Short Term Goal 1 (Week 1): Pt will perform bed mobility with supervision and no cues for technique or initiation. PT Short Term Goal 2 (Week 1): pt will perform sit<>stand transfers to no AD with supervision and min cues for technique PT Short Term Goal 3 (Week 1): Pt will perform stand pivot transfers with no AD and supervision requiring min cues for technique PT Short Term Goal 4 (Week 1): Pt will consistently ambulate at least 100 ft with close supervision using RW without drop in SpO2 below 94%. PT Short Term Goal 5 (Week 1): Pt will perform dynamic standing activities without AD with CGA and min cues.   Skilled Therapeutic Interventions/Progress Updates:   Pt received sitting in WC and agreeable to PT  Ambulatory transfer to [facility] with supervision assist and no AD. Gait training with no AD and supervision assist 2 x 11f with cues for safety in turns and attention to task.   [facility] assessed O2 on 2l/min O2 100%. Removed O2 and SpO2remained 99%. Gait traingin without O2 with SpO2 99%. Pt demonstrates mild SOB upon completion with mild change in SpO2 to 94-96%  Dynamic gait/balance training with agility ladder:  1 foot in each x 4.  Side stepping R and L x 2 each.   SLS with 3 sec hold x 2 with rest break between bouts.  Min assist with SLS and side stepping, supervision assist for stepping through. Tactile cues for safety and to prevent compensations.   Rocker board AP control x 10 and then lateral control x 10 with BUE support and cues for [place] ankle strategy from PT. SpO2 desat to 92%, rest for 30sec and increased to 96% on room air.   Patient returned to room and performed stand pivot to recliner with  supervision assist for safety . Pt left sitting in recliner with call bell in reach and all needs met.  Pt on 1L/min O2 per request.       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

## 2021-03-14 NOTE — Progress Notes (Signed)
PROGRESS NOTE   Subjective/Complaints:  No issues overnite , some nausea but no heartbun, no "drunk feeling" this am   ROS-limited due to aphasia Objective:   No results found. No results for input(s): WBC, HGB, HCT, PLT in the last 72 hours.  Recent Labs    03/11/21 0920  NA 139  K 4.3  CL 99  CO2 30  GLUCOSE 109*  BUN 21  CREATININE 1.29*  CALCIUM 9.2     Intake/Output Summary (Last 24 hours) at 03/14/2021 0737 Last data filed at 03/13/2021 1305 Gross per 24 hour  Intake 218 ml  Output --  Net 218 ml         Physical Exam: Vital Signs Blood pressure (!) 106/50, pulse (!) 59, temperature 97.8 F (36.6 C), resp. rate 18, height 5\' 7"  (1.702 m), weight 90.2 kg, SpO2 97 %.   General: No acute distress Mood and affect are appropriate Heart: Regular rate and rhythm no rubs murmurs or extra sounds Lungs: Clear to auscultation, breathing unlabored, no rales or wheezes Abdomen: Positive bowel sounds, soft nontender to palpation, nondistended Extremities: No clubbing, cyanosis, or edema Skin: No evidence of breakdown, no evidence of rash  Neurologic: Cranial nerves II through XII intact, motor strength is 5/5 in bilateral deltoid, bicep, tricep, grip, hip flexor, knee extensors, ankle dorsiflexor and plantar flexor Sensory exam unable to assess due to aphasia Still requiring gestural cues for body part ID as well as simple commands Musculoskeletal: Full range of motion in all 4 extremities. No joint swelling    Assessment/Plan: 1. Functional deficits which require 3+ hours per day of interdisciplinary therapy in a comprehensive inpatient rehab setting. Physiatrist is providing close team supervision and 24 hour management of active medical problems listed below. Physiatrist and rehab team continue to assess barriers to discharge/monitor patient progress toward functional and medical goals  Care  Tool:  Bathing    Body parts bathed by patient: Right arm, Left arm, Chest, Abdomen, Front perineal area, Right upper leg, Buttocks, Left upper leg, Right lower leg, Face, Left lower leg         Bathing assist Assist Level: Supervision/Verbal cueing     Upper Body Dressing/Undressing Upper body dressing   What is the patient wearing?: Hospital gown only    Upper body assist Assist Level: Set up assist    Lower Body Dressing/Undressing Lower body dressing      What is the patient wearing?: Hospital gown only     Lower body assist Assist for lower body dressing: Set up assist     Toileting Toileting    Toileting assist Assist for toileting: Supervision/Verbal cueing     Transfers Chair/bed transfer  Transfers assist     Chair/bed transfer assist level: Contact Guard/Touching assist Chair/bed transfer assistive device:   Ambulation assist      Assist level: Contact Guard/Touching assist Assistive device: No Device Max distance: 168ft   Walk 10 feet activity   Assist     Assist level: Contact Guard/Touching assist Assistive device: Walker-rolling   Walk 50 feet activity   Assist    Assist level: Contact Guard/Touching assist Assistive device:  Walker-rolling    Walk 150 feet activity   Assist Walk 150 feet activity did not occur: Safety/medical concerns         Walk 10 feet on uneven surface  activity   Assist Walk 10 feet on uneven surfaces activity did not occur: Safety/medical concerns         Wheelchair     Assist Is the patient using a wheelchair?: Yes Type of Wheelchair: Manual    Wheelchair assist level: Total Assistance - Patient < 25% Max wheelchair distance: 300 ft    Wheelchair 50 feet with 2 turns activity    Assist        Assist Level: Total Assistance - Patient < 25%   Wheelchair 150 feet activity     Assist      Assist Level: Total Assistance - Patient  < 25%   Blood pressure (!) 106/50, pulse (!) 59, temperature 97.8 F (36.6 C), resp. rate 18, height 5\' 7"  (1.702 m), weight 90.2 kg, SpO2 97 %.  Medical Problem List and Plan: 1.   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  Antithrombotics: -DVT/anticoagulation:  Pharmaceutical: Other (comment) Eliquis             -antiplatelet therapy: N/A 3. Pain Management: Tylenol as needed 4. Mood: Provide emotional support             -antipsychotic agents: N/A 5. Neuropsych: This patient is not capable of making decisions on her own behalf. 6. Skin/Wound Care: Routine skin checks 7. Fluids/Electrolytes/Nutrition: Routine in and outs with follow-up chemistries 8.  Atrial fibrillation with RVR.  Status post cardioversion.  Follow-up cardiology services.  Amiodarone 200 mg  daily, verapamil 40 mg every 8 hours. 9.  Hypotension.  Currently on ProAmatine5mg  3 times daily.hope to wean as BP allows  Vitals:   03/13/21 2026 03/14/21 0332  BP: (!) 116/47 (!) 106/50  Pulse: (!) 59 (!) 59  Resp: 18 18  Temp: 98.8 F (37.1 C) 97.8 F (36.6 C)  SpO2: 94% 97%   HR and BP controlled but having some diziness and has been on Proamatine, HR on low side reduce Calan to q 12h   Sys BP>100 on reduced proamatine - monitor for symptoms of hypotension  BMET mild increase in creat o/w nl Reduce proamatine to 2.5mg  TID , was on Cozaar at home which may need to be restarted if BP remains up , HR too low to increase Calan  10.  Hyperlipidemia.  Lipitor 11.  History of COPD.  Monitor oxygen saturations.  Continue nebulizers  10/30- on home O2 regularly- con't reegimen 12.  Leukocytosis.  Chest x-ray negative.  Completed course of doxycycline  13.  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  Hx GERD restart PPI as pt with intermittent sx  LOS: 7 days A FACE TO FACE EVALUATION WAS PERFORMED  11/30 03/14/2021, 7:37 AM

## 2021-03-14 NOTE — Progress Notes (Signed)
Occupational Therapy Session Note  Patient Details  Name: Michelle Dean MRN: [id number] Date of Birth: 01-Aug-1945  Today's Date: 03/14/2021 OT Individual Time: 4098-1191 OT Individual Time Calculation (min): 38 min + 55 min   Short Term Goals: Week 1:  OT Short Term Goal 1 (Week 1): Pt will complete ambulatory toilet transfer with CGA OT Short Term Goal 2 (Week 1): Pt will complete grooming tasks while standing at the sink with supervision for balance OT Short Term Goal 3 (Week 1): Pt will complete shower transfer with no more than CGA  Skilled Therapeutic Interventions/Progress Updates:    Session 1 ([id number]) : Pt received seated in recliner, no c/o pain but endorses fatigue from poor night's sleep, agreeable to therapy. Session focus on self-care retraining, activity tolerance, functional cognition, transfers in prep for improved ADL/IADL/func mobility performance + decreased caregiver burden. Doffed/donned B socks with S and increased time, cues for PLB breathing throughout. Donned B teds with mod A to don over feet, pt able to pull them up to knees. Donned house coat with S and declined further ADL, states she took shower yesterday and just went to bathroom. Ambulated throughout session with no  AD and S to cue for PLB as pt required frequent rest breaks with UE support on hallway rail. Sat02 at 98% on 2L via [place], HR at 72 bpm post activity.  Standing at [place], completed the following games to target written / visual command follow in prep for improved IADL performance (e.g., medication management, meal prep, etc.)   -Bell Cancellation, 3 min 19 secs, 0 errors, 5 distractors hit; required moderate amount of visual/verbal cues for initial instructions  -Trail Making Part A, required 4 min 34 secs to complete, 3 misses, and ~50% visual cues to locate correct number; pt able to state next number with 90% accuracy, but with difficulty locating on screen and asking therapist to confirm accuracy  of number  -Memory Game, pt able to read written words and identify pictures when cued at ~75% accuracy. Improved from previous sessions.  Pt left seated in recliner with nursing staff present with safety belt alarm engaged, call bell in reach, and all immediate needs met.    Session 2 [phone]): Pt received seated in recliner, no c/o pain but reporting dry nasal passages throughout session due to session, agreeable to therapy. Session focus on self-care retraining, activity tolerance, functional cognition, transfers in prep for improved ADL/IADL/func mobility performance + decreased caregiver burden. Ambulated throughout session with no  AD and S to cue for PLB as pt required frequent rest breaks with UE support on hallway rail. CGA at times due to fatigue. Ambulated to and from Day room.   Seated, played matching card game with numbers. Able to recognize matches without assist, but required total A to accurately state ~20% of the numbers. Lined up numbers 1 - 10 in numerical order with overall min to mod A to recognize numbers 7 and 8.   Presented with 2 $50 and 2 $20 bills. Practiced identifying numerical amount and totaling up cash when instructed (e.g., give me $100). Pt required total A to accurately tally up any amounts that required 2 different bills. At times, able to accurately hand over $20 and $50 when instructed. Pt demoing fair emergent awareness of difficulties with money management, stating frequently " I won't be able to ....my son will have to be with me."  Finally, presented with pill bottle and pill box. Pt read instructions at 90% accuracy (take  one tablet every morning), but required total A and step-by-step visual cues to accurately place in pill box.  Pt left seated in recliner with safety belt in place,  call bell in reach, and all immediate needs met.    Therapy Documentation Precautions:  Precautions Precautions: Fall Precaution Comments: receptive>expressive  aphasia, 2L O2 via [place] if SpO2 <94%, TEDs daily Restrictions Weight Bearing Restrictions: No  Pain: no c/o, ongoing c/o dry nasal passages   ADL: See Care Tool for more details.   Therapy/Group: Individual Therapy  Volanda Napoleon MS, OTR/L  03/14/2021, 6:42 AM

## 2021-03-15 MED ORDER — MIDODRINE HCL 5 MG PO TABS
2.5000 mg | ORAL_TABLET | Freq: Two times a day (BID) | ORAL | Status: AC
  Administered 2021-03-15 – 2021-03-17 (×5): 2.5 mg via ORAL
  Filled 2021-03-15 (×5): qty 1

## 2021-03-15 NOTE — Progress Notes (Signed)
Patient ID: Michelle Dean, female   DOB: 1945-10-30, 75 y.o.   MRN: [id number]  Patient referral sent to [facility]

## 2021-03-15 NOTE — Progress Notes (Signed)
Patient ID: Michelle Dean, female   DOB: 02-07-1946, 75 y.o.   MRN: [id number]  Completed FMLA paperwork sent to pt son

## 2021-03-15 NOTE — Progress Notes (Signed)
Speech Language Pathology Daily Session Note  Patient Details  Name: Michelle Dean MRN: [id number] Date of Birth: 12-19-1945  Today's Date: 03/15/2021 SLP Individual Time: 0900-0945 SLP Individual Time Calculation (min): 45 min  Short Term Goals: Week 2: SLP Short Term Goal 1 (Week 2): STG=LTG due to ELOS  Skilled Therapeutic Interventions: Pt received in recliner and agreeable to skilled ST intervention with focus on family education with son and daughter-in-law. SLP provided both verbal and written education on aphasia, communication strategies, and follow-up therapy recommendations. SLP also facilitated hands-on demonstration on communication strategies with emphasis on comprehension (e.g., yes/no questions, providing field of choices, using visuals, written cues, etc.). Family expressed preference for [facility] therapy vs. OP which was communicated to therapy team and SW via secure chat. All questions were addressed. Patient was left in chair with alarm activated and immediate needs within reach at end of session. Continue per current plan of care.      Pain Pain Assessment Pain Scale: 0-10 Pain Score: 0-No pain  Therapy/Group: Individual Therapy  Aneita Kiger T Vandell Kun 03/15/2021, 9:09 AM

## 2021-03-15 NOTE — Progress Notes (Signed)
PROGRESS NOTE   Subjective/Complaints:Spoke with family about receptive aphasia and demonstrated pt's inability to follow simple commands  ROS-limited due to aphasia Objective:   No results found. No results for input(s): WBC, HGB, HCT, PLT in the last 72 hours.  No results for input(s): NA, K, CL, CO2, GLUCOSE, BUN, CREATININE, CALCIUM in the last 72 hours.   Intake/Output Summary (Last 24 hours) at 03/15/2021 0749 Last data filed at 03/15/2021 0728 Gross per 24 hour  Intake 395 ml  Output --  Net 395 ml         Physical Exam: Vital Signs Blood pressure (!) 113/50, pulse 65, temperature (!) 97.4 F (36.3 C), resp. rate 16, height 5\' 7"  (1.702 m), weight 90.2 kg, SpO2 97 %.  General: No acute distress Mood and affect are appropriate Heart: Regular rate and rhythm no rubs murmurs or extra sounds Lungs: Clear to auscultation, breathing unlabored, no rales or wheezes Abdomen: Positive bowel sounds, soft nontender to palpation, nondistended Extremities: No clubbing, cyanosis, or edema Skin: No evidence of breakdown, no evidence of rash  Neurologic: Cranial nerves II through XII intact, motor strength is 5/5 in bilateral deltoid, bicep, tricep, grip, hip flexor, knee extensors, ankle dorsiflexor and plantar flexor Sensory exam unable to assess due to aphasia Still requiring gestural cues for body part ID as well as simple commands Musculoskeletal: Full range of motion in all 4 extremities. No joint swelling    Assessment/Plan: 1. Functional deficits which require 3+ hours per day of interdisciplinary therapy in a comprehensive inpatient rehab setting. Physiatrist is providing close team supervision and 24 hour management of active medical problems listed below. Physiatrist and rehab team continue to assess barriers to discharge/monitor patient progress toward functional and medical goals  Care Tool:  Bathing     Body parts bathed by patient: Right arm, Left arm, Chest, Abdomen, Front perineal area, Right upper leg, Buttocks, Left upper leg, Right lower leg, Face, Left lower leg         Bathing assist Assist Level: Supervision/Verbal cueing     Upper Body Dressing/Undressing Upper body dressing   What is the patient wearing?: Hospital gown only    Upper body assist Assist Level: Set up assist    Lower Body Dressing/Undressing Lower body dressing      What is the patient wearing?: Hospital gown only     Lower body assist Assist for lower body dressing: Set up assist     Toileting Toileting    Toileting assist Assist for toileting: Supervision/Verbal cueing     Transfers Chair/bed transfer  Transfers assist     Chair/bed transfer assist level: Contact Guard/Touching assist Chair/bed transfer assistive device:   Ambulation assist      Assist level: Contact Guard/Touching assist Assistive device: No Device Max distance: 132ft   Walk 10 feet activity   Assist     Assist level: Contact Guard/Touching assist Assistive device: Walker-rolling   Walk 50 feet activity   Assist    Assist level: Contact Guard/Touching assist Assistive device: Walker-rolling    Walk 150 feet activity   Assist Walk 150 feet activity did not occur: Safety/medical concerns  Walk 10 feet on uneven surface  activity   Assist Walk 10 feet on uneven surfaces activity did not occur: Safety/medical concerns         Wheelchair     Assist Is the patient using a wheelchair?: Yes Type of Wheelchair: Manual    Wheelchair assist level: Total Assistance - Patient < 25% Max wheelchair distance: 300 ft    Wheelchair 50 feet with 2 turns activity    Assist        Assist Level: Total Assistance - Patient < 25%   Wheelchair 150 feet activity     Assist      Assist Level: Total Assistance - Patient < 25%   Blood  pressure (!) 113/50, pulse 65, temperature (!) 97.4 F (36.3 C), resp. rate 16, height 5\' 7"  (1.702 m), weight 90.2 kg, SpO2 97 %.  Medical Problem List and Plan: 1.   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  Antithrombotics: -DVT/anticoagulation:  Pharmaceutical: Other (comment) Eliquis             -antiplatelet therapy: N/A 3. Pain Management: Tylenol as needed 4. Mood: Provide emotional support             -antipsychotic agents: N/A 5. Neuropsych: This patient is not capable of making decisions on her own behalf. 6. Skin/Wound Care: Routine skin checks 7. Fluids/Electrolytes/Nutrition: Routine in and outs with follow-up chemistries 8.  Atrial fibrillation with RVR.  Status post cardioversion.  Follow-up cardiology services.  Amiodarone 200 mg  daily, verapamil 40 mg every 8 hours. 9.  Hypotension.  Currently on ProAmatine5mg  3 times daily.hope to wean as BP allows  Vitals:   03/14/21 1939 03/15/21 0355  BP: (!) 132/48 (!) 113/50  Pulse: (!) 58 65  Resp: 20 16  Temp: 98.2 F (36.8 C) (!) 97.4 F (36.3 C)  SpO2: 97% 97%   HR and BP controlled but having some diziness and has been on Proamatine, HR on low side reduce Calan to q 12h   Sys BP>100 on reduced proamatine - monitor for symptoms of hypotension  BMET mild increase in creat o/w nl Reduce proamatine to 2.5mg  BID , was on Cozaar at home which may need to be restarted if BP remains up , HR too low to increase Calan  10.  Hyperlipidemia.  Lipitor 11.  History of COPD.  Monitor oxygen saturations.  Continue nebulizers  10/30- on home O2 regularly- con't reegimen 12.  Leukocytosis.  Chest x-ray negative.  Completed course of doxycycline  13.  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  Hx GERD restart PPI as pt with intermittent sx  LOS: 8 days A FACE TO FACE EVALUATION WAS PERFORMED  11/30 03/15/2021, 7:49 AM

## 2021-03-15 NOTE — Progress Notes (Signed)
Physical Therapy Weekly Progress Note  Patient Details  Name: Michelle Dean MRN: [id number] Date of Birth: 1946/04/06  Beginning of progress report period: March 08, 2021 End of progress report period: March 15, 2021  Today's Date: 03/15/2021 PT Individual Time: 1004-1100 PT Individual Time Calculation (min): 56 min   Patient has met 5 of 5 short term goals.  Pt is making steady progress towards LTG for supervision-mod I without AD. Currently pt requires supervision assist for gait, transfers, and stair management with min cues for technique intermittently. Pt's family present today for education.   Patient continues to demonstrate the following deficits muscle weakness, decreased cardiorespiratoy endurance, decreased coordination, decreased attention to left, decreased awareness, decreased problem solving, decreased safety awareness, decreased memory, and delayed processing, and decreased standing balance, hemiplegia, and decreased balance strategies and therefore will continue to benefit from skilled PT intervention to increase functional independence with mobility.  Patient progressing toward long term goals..  Continue plan of care.  PT Short Term Goals Week 1:  PT Short Term Goal 1 (Week 1): Pt will perform bed mobility with supervision and no cues for technique or initiation. PT Short Term Goal 1 - Progress (Week 1): Met PT Short Term Goal 2 (Week 1): pt will perform sit<>stand transfers to no AD with supervision and min cues for technique PT Short Term Goal 2 - Progress (Week 1): Met PT Short Term Goal 3 (Week 1): Pt will perform stand pivot transfers with no AD and supervision requiring min cues for technique PT Short Term Goal 3 - Progress (Week 1): Met PT Short Term Goal 4 (Week 1): Pt will consistently ambulate at least 100 ft with close supervision using RW without drop in SpO2 below 94%. PT Short Term Goal 4 - Progress (Week 1): Met PT Short Term Goal 5 (Week 1): Pt will  perform dynamic standing activities without AD with CGA and min cues. PT Short Term Goal 5 - Progress (Week 1): Met Week 2:  PT Short Term Goal 1 (Week 2): STG=LTG due to ELOS  Skilled Therapeutic Interventions/Progress Updates:   Pt received sitting in WC and agreeable to PT. Pt's family present for entire session for education.    Car transfer training performed x2 with supervision assist and visual cues for sit<>pivot technique. Son provided supervision assist on 2nd bout.   Step management training without AD at ascend/descend 6inch step x 4 with CGA for safety with son providing assist on the third and fourth bouts.   Pt instruction in modified [place] level A balance program with hand out provided and verbal/visual cues for technique throughout and UE supported on Parallel bars.   Patient returned to room and performed stand pivot to recliner with supervision assist. Pt left sitting in recliner with call bell in reach and all needs met.    Pt on 1L/min O2 throughout session with SpO2 98% following gait training.        Therapy Documentation Precautions:  Precautions Precautions: Fall Precaution Comments: receptive>expressive aphasia, 2L O2 via [place] if SpO2 <94%, TEDs daily Restrictions Weight Bearing Restrictions: No  Pain: Pain Assessment Pain Scale: 0-10 Pain Score: 0-No pain   Therapy/Group: Individual Therapy  Lorie Phenix 03/15/2021, 2:03 PM

## 2021-03-15 NOTE — Progress Notes (Signed)
Occupational Therapy Session Note  Patient Details  Name: Michelle Dean MRN: [id number] Date of Birth: 04-19-1946  Today's Date: 03/15/2021 OT Individual Time: 4132-4401 OT Individual Time Calculation (min): 54 min + 38 min   Short Term Goals: Week 1:  OT Short Term Goal 1 (Week 1): Pt will complete ambulatory toilet transfer with CGA OT Short Term Goal 2 (Week 1): Pt will complete grooming tasks while standing at the sink with supervision for balance OT Short Term Goal 3 (Week 1): Pt will complete shower transfer with no more than CGA  Skilled Therapeutic Interventions/Progress Updates:    Session 1 (0272-5366): Pt received seated in recliner with son/DIL present for family education, agreeable to therapy. Session focus on self-care retraining, activity tolerance, family education, transfer training, functional cognition in prep for improved ADL/IADL/func mobility performance + decreased caregiver burden. No c/o pain throughout. MD in/out morning assessment. On 1L of O2 via [place] throughout session.  Reviewed ADL performance and bathroom transfer status at overall S level with no AD. Pt able to doff/don socks with S and donned teds with mod A to thread over heels. Pt completed TTB transfer in room with overall S and min verbal and visual cues for technique.  Rec 24/7 S for overall safety, medication administration, meal prep, shopping, and general IADL assistance. Family verbalized understanding but with questions about how long 24/7 S will be needed.   Issued printed hand out detailing energy conservation techniques, falls risk reduction, and overall safety consideration for ADL performance upon DC. Issued stroke support group flyer and reviewed "Be Fast" acronym and importance of recognizing s/sx of stroke.   Pt participated in money management activity, requiring max A overall to identify name of coin/dollar and monetary amount. Demonstrates perseveration of previous words stated in  activity. Additionally, participated in medication management activity, requiring total A to accurately read 2 pill bottle instructions and step-by-step visual cues to accurately place in pill box.  Family with no further questions at this time. Pt left seated in recliner with safety belt placed, family present, call bell in reach, and all immediate needs met.    Session 2 [id number]): Pt received seated in recliner, no c/o pain but cont to indicate nasal passage dryness, agreeable to therapy. Session focus on dynamic standing balance, activity tolerance, IADL retraining in prep for improved ADL/IADL/func mobility performance + decreased caregiver burden. On 1L of O2 via [place] throughout session. Ambulation throughout session with S and no AD.  Seated in kitchen, practiced naming and sorting assorted fruits/ cutlery by color in prep for improved food prep/grocery shopping performance. Pt able to name fruits with ~15% accuracy initially and multimodal cuing to indicate correct name and color. Able to sort by color with ~50% accuracy with use of written cards. Pt able to correctly name fork, spoon, knife, and bowl on first attempt. Total A to name plate and to sort cutlery by "utensil" or "dishware."  Ambulated back to room while holding cup of water with S and no spillage.  Pt left seated in recliner with safety belt alarm engaged, call bell in reach, and all immediate needs met.    Therapy Documentation Precautions:  Precautions Precautions: Fall Precaution Comments: receptive>expressive aphasia, 2L O2 via [place] if SpO2 <94%, TEDs daily Restrictions Weight Bearing Restrictions: No  Pain:  No c/o ADL: See Care Tool for more details.  Therapy/Group: Individual Therapy  Volanda Napoleon MS, OTR/L  03/15/2021, 6:46 AM

## 2021-03-16 NOTE — Progress Notes (Signed)
Speech Language Pathology Daily Session Note  Patient Details  Name: Michelle Dean MRN: [id number] Date of Birth: 03/31/1946  Today's Date: 03/16/2021 SLP Individual Time: 9563-8756 SLP Individual Time Calculation (min): 42 min  Short Term Goals: Week 2: SLP Short Term Goal 1 (Week 2): STG=LTG due to ELOS  Skilled Therapeutic Interventions: Pt seen for skilled ST with focus on communication goals, SLP donning clear mask to aid in lip reading and comprehension. SLP providing ongoing education to patient on term "Aphasia" and recommendations for ongoing therapy. Pt frequently saying "I'm trying" and "I just can't say it" during moments of significant word finding. SLP facilitating communication of simple biographical info by providing max A verbal and visual cues (white board written cues helpful) for ~50% accuracy. Pt was emotional this date regarding family issues and missing her dog, pt unable to recall name and breed of dog which was causing increasing frustration. Pt would benefit from labeled family pictures to increase recall of important names while reducing frustration. Pt left in recliner with belt alarm set, call button within reach and independently utilizing Spirometer. Cont ST POC.   Pain Pain Assessment Pain Scale: 0-10 Pain Score: 0-No pain  Therapy/Group: Individual Therapy  Tacey Ruiz 03/16/2021, 10:24 AM

## 2021-03-16 NOTE — Progress Notes (Signed)
Received a message 2 days ago from Dr. Rodman Pickle regarding soft blood pressure.  I reviewed the chart, blood pressure is hovering around 110 mmHg systolic.  In view of her stroke, no need to continue verapamil as she is maintaining sinus rhythm and continue amiodarone only.  Continue midodrine as well for blood pressure support.  Orders placed.   Yates Decamp, MD, [facility] 03/16/2021, 6:09 PM Office: [phone] Fax: [phone] Pager: [phone]

## 2021-03-16 NOTE — Progress Notes (Signed)
Physical Therapy Session Note  Patient Details  Name: Michelle Dean MRN: [id number] Date of Birth: 13-Nov-1945  Today's Date: 03/16/2021 PT Individual Time: 1517-6160 PT Individual Time Calculation (min): 40 min   Short Term Goals: Week 2:  PT Short Term Goal 1 (Week 2): STG=LTG due to ELOS  Skilled Therapeutic Interventions/Progress Updates:    Patient received sitting up in recliner,agreeable to PT. She denies pain. Patient on 1L O2 via [place] satting at 97%. Patient ambulating to [facility] therapy gym with no AD and CGA. She reports ambulating slower than she typically does due to B quad soreness from exercises. Patient completed x10 mins on NuStep for improved B LE large amplitude reciprocal stepping. O2 95-98%. PT guiding patient through LE stretching. She tolerated well and ambulated back to her room with supervision. Up in recliner, seatbelt alarm on, call light within reach.    Therapy Documentation Precautions:  Precautions Precautions: Fall Precaution Comments: receptive>expressive aphasia, 2L O2 via [place] if SpO2 <94%, TEDs daily Restrictions Weight Bearing Restrictions: No     Therapy/Group: Individual Therapy  Elizebeth Koller, PT, DPT, CBIS  03/16/2021, 7:44 AM

## 2021-03-16 NOTE — Progress Notes (Signed)
Occupational Therapy Discharge Summary  Patient Details  Name: Michelle Dean MRN: [id number] Date of Birth: Oct 12, 1945   Patient has met 9 of 9 long term goals due to improved activity tolerance, improved balance, postural control, ability to compensate for deficits, functional use of  LEFT upper extremity, improved awareness, and improved coordination.  Patient to discharge at overall Supervision level.  Patient's care partner is independent to provide the necessary physical and cognitive assistance at discharge.    Reasons goals not met: NA  Recommendation:  Patient will benefit from ongoing skilled OT services in home health setting to continue to advance functional skills in the area of BADL, iADL, and Reduce care partner burden.  Equipment: TTB  Reasons for discharge: treatment goals met and discharge from hospital  Patient/family agrees with progress made and goals achieved: Yes  OT Discharge Precautions/Restrictions  Precautions Precautions: Fall Precaution Comments: receptive>expressive aphasia, 1L O2 via [place] if SpO2 <94%, TEDs daily Restrictions Weight Bearing Restrictions: No  Pain Pain Assessment Pain Scale: 0-10 Pain Score: 0-No pain ADL ADL Eating: Independent Grooming: Supervision/safety Where Assessed-Grooming: Standing at sink Upper Body Bathing: Supervision/safety Where Assessed-Upper Body Bathing: Shower Lower Body Bathing: Supervision/safety Where Assessed-Lower Body Bathing: Shower Upper Body Dressing: Independent Where Assessed-Upper Body Dressing: Chair Lower Body Dressing: Supervision/safety Where Assessed-Lower Body Dressing: Chair Toileting: Supervision/safety Where Assessed-Toileting: Glass blower/designer: Distant supervision Armed forces technical officer Method: Counselling psychologist: Energy manager: Close supervison Clinical cytogeneticist Method: Optometrist: Radio broadcast assistant, Physiological scientist: Not assessed Social research officer, government Method: Heritage manager: Radio broadcast assistant Vision Baseline Vision/History: 1 Wears glasses (not in hospital) Patient Visual Report: Other (comment) (unable to state due to global aphasia) Vision Assessment?: No apparent visual deficits (grossly [facility] for ADL) Perception  Perception: Within Functional Limits Praxis Praxis: Intact Cognition Overall Cognitive Status: Difficult to assess (due to global aphasia) Arousal/Alertness: Awake/alert Orientation Level: Oriented to person;Oriented to place Year: Other (Comment) (unable to state due to global aphasia) Month:  (unable to state due to global aphasia) Day of Week: Other (Comment) (unable to state due to global aphasia) Memory:  (unable to fully assess due to global aphasia) Immediate Memory Recall:  (unable to state due to global aphasia) Memory Recall Sock:  (unable to state due to global aphasia) Memory Recall Blue:  (unable to state due to global aphasia) Memory Recall Bed:  (unable to state due to global aphasia) Problem Solving: Appears intact Safety/Judgment: Impaired Sensation Sensation Light Touch: Appears Intact Hot/Cold: Appears Intact Proprioception: Appears Intact Stereognosis: Appears Intact Coordination Gross Motor Movements are Fluid and Coordinated: Yes Fine Motor Movements are Fluid and Coordinated: Yes Coordination and Movement Description: Limited due to generalized deconditioning, mild balance deficits improved since eval, but WFL for BADL Finger Nose Finger Test: Unable to perform due to receptive deficits, pt stating "I don't understand" when given multimodal cues regarding how to complete Motor  Motor Motor: Other (comment) Motor - Discharge Observations: Limited due to generalized deconditioning, mild balance deficits improved since eval Mobility  Bed Mobility Bed Mobility: Sit to Supine;Supine to Sit Supine to Sit:  Independent Sit to Supine: Independent Transfers Sit to Stand: Supervision/Verbal cueing Stand to Sit: Supervision/Verbal cueing  Trunk/Postural Assessment  Cervical Assessment Cervical Assessment: Exceptions to [facility] (forward head) Thoracic Assessment Thoracic Assessment: Exceptions to [facility] (rounded shoulders) Lumbar Assessment Lumbar Assessment: Exceptions to [facility] (posterior pelvic tilt in sitting) Postural Control Postural Control: Within Functional Limits  Balance Balance Balance Assessed:  Yes Static Sitting Balance Static Sitting - Balance Support: No upper extremity supported;Feet supported Static Sitting - Level of Assistance: 7: Independent Dynamic Sitting Balance Dynamic Sitting - Balance Support: During functional activity;No upper extremity supported;Feet supported Dynamic Sitting - Level of Assistance: 5: Stand by assistance Static Standing Balance Static Standing - Balance Support: No upper extremity supported;During functional activity Static Standing - Level of Assistance: 5: Stand by assistance Dynamic Standing Balance Dynamic Standing - Balance Support: During functional activity;No upper extremity supported Dynamic Standing - Level of Assistance: 5: Stand by assistance Extremity/Trunk Assessment RUE Assessment RUE Assessment: Within Functional Limits LUE Assessment LUE Assessment: Within Functional Limits General Strength Comments: 4-/5 in shoulder flexion   Volanda Napoleon MS, OTR/L   03/17/2021, 4:05 PM

## 2021-03-16 NOTE — Progress Notes (Signed)
Occupational Therapy Weekly Progress Note  Patient Details  Name: Michelle Dean MRN: [id number] Date of Birth: [birth year]  Beginning of progress report period: March 09, 2021 End of progress report period: March 16, 2021  Today's Date: 03/16/2021 OT Individual Time: 9892-1194 OT Individual Time Calculation (min): 53 min    Patient has met 3 of 3 short term goals.  Pt has made good progress this week towards LTG. Pt has demonstrated improved dynamic standing balance, energy conservation techniques, carryover of PLB during activity, receptive language comprehension to presently complete BADL at overall S level and ambulatory bathroom transfers with S and no AD. Does require mod A to don B teds.  Pt cont to be primarily limited by receptive/expressive language deficits impairing ability to safely complete IADL. Anticipate 24/7 S required upon DC. Family education completed earlier this week.   Patient continues to demonstrate the following deficits: and therefore will continue to benefit from skilled OT intervention to enhance overall performance with BADL, iADL, and Reduce care partner burden.  Patient progressing toward long term goals..  Continue plan of care.  OT Short Term Goals Week 1:  OT Short Term Goal 1 (Week 1): Pt will complete ambulatory toilet transfer with CGA OT Short Term Goal 1 - Progress (Week 1): Met OT Short Term Goal 2 (Week 1): Pt will complete grooming tasks while standing at the sink with supervision for balance OT Short Term Goal 2 - Progress (Week 1): Met OT Short Term Goal 3 (Week 1): Pt will complete shower transfer with no more than CGA OT Short Term Goal 3 - Progress (Week 1): Met Week 2:  OT Short Term Goal 1 (Week 2): STG = LTG 2/2 ELOS  Skilled Therapeutic Interventions/Progress Updates:    Pt received side-lying in bed, no c/o pain, agreeable to therapy. Session focus on self-care retraining, activity tolerance, PLB, dynamic standing balance, in prep  for improved ADL/IADL/func mobility performance + decreased caregiver burden. Completed bed mobility with independence. Ambulatory toilet transfer with S and completed toileting tasks distant S post continent void of bladder. Transferred to TTB with min gestural cues for correct technique. Completed full-body bathing with S, min cues for safety. Pt adamantly declining to wear [place] in shower. SatO2 post shower at 86%, recovered in 30 seconds to 96% on 1L via [place]. Donned hospital gown with min A to tie in back/ house coat with S. Mod A to don teds over heels and pt able to pull up to knees. S to don/doff gripper socks. Completed seated grooming with independence.   Ambulated > 4W > 5N tower with overall S and no AD. Required standing / seated rest breaks every ~100 ft with min cues to initiate. SatO2 at 88 to 96% on 1L via [place] throughout activity, min demonstrational cuing for PLB.   Pt left seated in recliner with safety belt alarm engaged, call bell in reach, and all immediate needs met.    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   Therapy/Group: Individual Therapy  Volanda Napoleon MS, OTR/L  03/16/2021, 6:46 AM

## 2021-03-17 LAB — BASIC METABOLIC PANEL WITH GFR
Anion gap: 8 (ref 5–15)
BUN: 14 mg/dL (ref 8–23)
CO2: 25 mmol/L (ref 22–32)
Calcium: 9 mg/dL (ref 8.9–10.3)
Chloride: 105 mmol/L (ref 98–111)
Creatinine, Ser: 1.1 mg/dL — ABNORMAL HIGH (ref 0.44–1.00)
GFR, Estimated: 52 mL/min — ABNORMAL LOW
Glucose, Bld: 130 mg/dL — ABNORMAL HIGH (ref 70–99)
Potassium: 4.6 mmol/L (ref 3.5–5.1)
Sodium: 138 mmol/L (ref 135–145)

## 2021-03-17 LAB — CBC
HCT: 39.1 % (ref 36.0–46.0)
Hemoglobin: 12 g/dL (ref 12.0–15.0)
MCH: 29.7 pg (ref 26.0–34.0)
MCHC: 30.7 g/dL (ref 30.0–36.0)
MCV: 96.8 fL (ref 80.0–100.0)
Platelets: 336 10*3/uL (ref 150–400)
RBC: 4.04 MIL/uL (ref 3.87–5.11)
RDW: 13.9 % (ref 11.5–15.5)
WBC: 8 10*3/uL (ref 4.0–10.5)
nRBC: 0 % (ref 0.0–0.2)

## 2021-03-17 MED ORDER — SENNOSIDES-DOCUSATE SODIUM 8.6-50 MG PO TABS
2.0000 | ORAL_TABLET | Freq: Two times a day (BID) | ORAL | Status: DC
  Administered 2021-03-17 – 2021-03-19 (×5): 2 via ORAL
  Filled 2021-03-17 (×5): qty 2

## 2021-03-17 MED ORDER — BISACODYL 10 MG RE SUPP
10.0000 mg | Freq: Once | RECTAL | Status: AC
  Administered 2021-03-17: 10 mg via RECTAL
  Filled 2021-03-17: qty 1

## 2021-03-17 NOTE — Progress Notes (Addendum)
PROGRESS NOTE   Subjective/Complaints: Appreciate cardiology note , pt c/o constipation , last BM 11/3 ROS-limited due to aphasia Objective:   No results found. No results for input(s): WBC, HGB, HCT, PLT in the last 72 hours.  No results for input(s): NA, K, CL, CO2, GLUCOSE, BUN, CREATININE, CALCIUM in the last 72 hours.   Intake/Output Summary (Last 24 hours) at 03/17/2021 0734 Last data filed at 03/16/2021 1840 Gross per 24 hour  Intake 716 ml  Output --  Net 716 ml         Physical Exam: Vital Signs Blood pressure 106/77, pulse 70, temperature 97.9 F (36.6 C), temperature source Oral, resp. rate 18, height 5\' 7"  (1.702 m), weight 90 kg, SpO2 94 %.  General: No acute distress Mood and affect are appropriate Heart: Regular rate and rhythm no rubs murmurs or extra sounds Lungs: Clear to auscultation, breathing unlabored, no rales or wheezes Abdomen: Positive bowel sounds, soft nontender to palpation, nondistended Extremities: No clubbing, cyanosis, or edema Skin: No evidence of breakdown, no evidence of rash  Neurologic: Cranial nerves II through XII intact, motor strength is 5/5 in bilateral deltoid, bicep, tricep, grip, hip flexor, knee extensors, ankle dorsiflexor and plantar flexor Sensory exam unable to assess due to aphasia Still requiring gestural cues for body part ID as well as simple commands Musculoskeletal: Full range of motion in all 4 extremities. No joint swelling    Assessment/Plan: 1. Functional deficits which require 3+ hours per day of interdisciplinary therapy in a comprehensive inpatient rehab setting. Physiatrist is providing close team supervision and 24 hour management of active medical problems listed below. Physiatrist and rehab team continue to assess barriers to discharge/monitor patient progress toward functional and medical goals  Care Tool:  Bathing    Body parts bathed by  patient: Right arm, Left arm, Chest, Abdomen, Front perineal area, Right upper leg, Buttocks, Left upper leg, Right lower leg, Face, Left lower leg         Bathing assist Assist Level: Supervision/Verbal cueing     Upper Body Dressing/Undressing Upper body dressing   What is the patient wearing?: Hospital gown only    Upper body assist Assist Level: Set up assist    Lower Body Dressing/Undressing Lower body dressing      What is the patient wearing?: Hospital gown only     Lower body assist Assist for lower body dressing: Set up assist     Toileting Toileting    Toileting assist Assist for toileting: Supervision/Verbal cueing     Transfers Chair/bed transfer  Transfers assist     Chair/bed transfer assist level: Contact Guard/Touching assist Chair/bed transfer assistive device: Programmer, multimedia   Ambulation assist      Assist level: Contact Guard/Touching assist Assistive device: No Device Max distance: 140ft   Walk 10 feet activity   Assist     Assist level: Contact Guard/Touching assist Assistive device: Walker-rolling   Walk 50 feet activity   Assist    Assist level: Contact Guard/Touching assist Assistive device: Walker-rolling    Walk 150 feet activity   Assist Walk 150 feet activity did not occur: Safety/medical concerns  Walk 10 feet on uneven surface  activity   Assist Walk 10 feet on uneven surfaces activity did not occur: Safety/medical concerns         Wheelchair     Assist Is the patient using a wheelchair?: Yes Type of Wheelchair: Manual    Wheelchair assist level: Total Assistance - Patient < 25% Max wheelchair distance: 300 ft    Wheelchair 50 feet with 2 turns activity    Assist        Assist Level: Total Assistance - Patient < 25%   Wheelchair 150 feet activity     Assist      Assist Level: Total Assistance - Patient < 25%   Blood pressure 106/77, pulse 70,  temperature 97.9 F (36.6 C), temperature source Oral, resp. rate 18, height 5\' 7"  (1.702 m), weight 90 kg, SpO2 94 %.  Medical Problem List and Plan: 1.   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  Antithrombotics: -DVT/anticoagulation:  Pharmaceutical: Other (comment) Eliquis             -antiplatelet therapy: N/A 3. Pain Management: Tylenol as needed 4. Mood: Provide emotional support             -antipsychotic agents: N/A 5. Neuropsych: This patient is not capable of making decisions on her own behalf. 6. Skin/Wound Care: Routine skin checks 7. Fluids/Electrolytes/Nutrition: Routine in and outs with follow-up chemistries 8.  Atrial fibrillation with RVR.  Status post cardioversion.  Follow-up cardiology services.  Amiodarone 200 mg  daily, verapamil 40 mg every 8 hours. 9.  Hypotension.  Currently on ProAmatine5mg  3 times daily.hope to wean as BP allows  Vitals:   03/17/21 0338 03/17/21 0340  BP: 103/63 106/77  Pulse: 74 70  Resp: 18 18  Temp:  97.9 F (36.6 C)  SpO2: 95% 94%   HR and BP controlled but having some diziness and has been on Proamatine, off  [place] per cardiology last dose was ~24h ago. Will stop proamatine in am now that pt off of verapamil   , 10.  Hyperlipidemia.  Lipitor 11.  History of COPD.  Monitor oxygen saturations.  Continue nebulizers  10/30- on home O2 regularly- con't reegimen 12.  Leukocytosis.  Chest x-ray negative.  Completed course of doxycycline  13.  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  Hx GERD restart PPI as pt with intermittent sx 15.  Constipation should improve off verapamil , will start senna s bid, dulc supp today  LOS: 10 days A FACE TO FACE EVALUATION WAS PERFORMED  11/30 03/17/2021, 7:34 AM

## 2021-03-17 NOTE — Discharge Summary (Signed)
Physician Discharge Summary  Patient ID: Michelle Dean MRN: [id number] DOB/AGE: 12-Aug-1945 75 y.o.  Admit date: 03/07/2021 Discharge date: 03/19/2021  Discharge Diagnoses:  Principal Problem:   Left middle cerebral artery stroke [facility]) DVT prophylaxis Atrial fibrillation with RVR Hypotension COPD Constipation  Discharged Condition: Stable  Significant Diagnostic Studies: CT HEAD WO CONTRAST (5MM)  Result Date: 02/26/2021 CLINICAL DATA:  Stroke follow-up EXAM: CT HEAD WITHOUT CONTRAST TECHNIQUE: Contiguous axial images were obtained from the base of the skull through the vertex without intravenous contrast. COMPARISON:  None. FINDINGS: Brain: High-density material in the left temporal and posterior frontal lobe, status post interval interventional thrombectomy. Area of hypodensity in the left temporal lobe, most likely edema. No new cortical infarct. No hydrocephalus, extra-axial collection, mass, mass effect, or midline shift. Vascular: No hyperdense vessel or unexpected calcification. Skull: Normal. Negative for fracture or focal lesion. Sinuses/Orbits: No acute finding. Other: None. IMPRESSION: 1. Hyperdense material in the left temporal and frontal lobes, in an area that has recently been treated with interventional thrombectomy, favored to represent contrast staining and not hemorrhage. Recommend follow-up exam in 6 hours for further evaluation. 2. Hypodensity in the left temporal lobe, possibly edema from recent infarct. These results were called by telephone at the time of interpretation on 02/26/2021 at 3:02 Am to provider [facility] , who verbally acknowledged these results. Electronically Signed   By: Merilyn Baba M.D.   On: 02/26/2021 03:03   MR BRAIN WO CONTRAST  Result Date: 02/26/2021 CLINICAL DATA:  Stroke follow-up. Aphasia. Status post TNK and revascularization of an occluded left MCA branch vessel. EXAM: MRI HEAD WITHOUT CONTRAST TECHNIQUE: Multiplanar, multiecho pulse  sequences of the brain and surrounding structures were obtained without intravenous contrast. COMPARISON:  Head CT 02/26/2021 FINDINGS: Brain: There is a moderate-sized acute infarct involving the left temporal lobe, inferior left parietal lobe, lateral left occipital lobe, and left insula (posterior MCA territory and MCA/PCA border zone). There is associated cytotoxic edema with sulcal effacement. Associated susceptibility artifact along the left sylvian fissure and left temporoparietal sulci corresponds to hyperdensity on CT and may reflect a combination of subarachnoid hemorrhage and cortical petechial hemorrhage. T2 hyperintensities elsewhere in the cerebral white matter bilaterally are nonspecific but compatible with mild-to-moderate chronic small vessel ischemic disease. There is mild cerebral atrophy. No midline shift or extra-axial fluid collection is evident. Vascular: Major intracranial vascular flow voids are preserved. Skull and upper cervical spine: Unremarkable bone marrow signal. Sinuses/Orbits: Bilateral cataract extraction. Trace left mastoid effusion. Clear paranasal sinuses. Other: None. IMPRESSION: 1. Moderate-sized acute posterior left MCA infarct. 2. Mild-to-moderate chronic small vessel ischemic disease. Electronically Signed   By: Logan Bores M.D.   On: 02/26/2021 16:21   IR CT Head Ltd  Result Date: 02/28/2021 INDICATION: New onset aphasia. Occluded left middle cerebral artery inferior division in the distal M2 M3 region on CT angiogram of the head and neck. EXAM: 1. EMERGENT LARGE VESSEL OCCLUSION THROMBOLYSIS (anterior CIRCULATION) COMPARISON:  CT angiogram of the head and neck of 02/25/2021. MEDICATIONS: Ancef 2 g IV antibiotic was administered within 1 hour of the procedure. ANESTHESIA/SEDATION: General anesthesia. CONTRAST:  Omnipaque 300 approximately 90 cc. FLUOROSCOPY TIME:  Fluoroscopy Time: 49 minutes 0 seconds (2707 mGy). COMPLICATIONS: None immediate. TECHNIQUE: Following a  full explanation of the procedure along with the potential associated complications, an informed witnessed consent was obtained. The risks of intracranial hemorrhage of 10%, worsening neurological deficit, ventilator dependency, death and inability to revascularize were all reviewed in detail with the  patient's son. The patient was then put under general anesthesia by the Department of Anesthesiology at [facility]. The right groin was prepped and draped in the usual sterile fashion. Thereafter using modified Seldinger technique, transfemoral access into the right common femoral artery was obtained without difficulty. Over a 0.035 inch guidewire an8 [place] Pinnacle 25 cm sheath was inserted. Through this, and also over a 0.035 inch guidewire a 5 [place] JB 1 catheter was advanced to the aortic arch region and selectively positioned in the left common carotid artery. FINDINGS: The left common carotid arteriogram demonstrates the left external carotid artery and its major branches to be widely patent. The left internal carotid artery at the bulb to the skull base is widely patent. The petrous, the cavernous and the supraclinoid segments are widely patent. A left posterior communicating artery is seen opacifying the left posterior cerebral artery distribution. The left anterior cerebral artery opacifies into the capillary and venous phases. The left middle cerebral artery M1 segment and superior division is widely patent. The inferior division superior parietal branch demonstrates wide patency into the capillary and venous phases. Complete angiographic occlusion is seen of the inferior division in the distal M2 M3 segment. PROCEDURE: Over a 0.035 inch exchange Roadrunner guidewire, the 5 [place] diagnostic catheter was exchanged for a 95 cm 087 balloon guide catheter which had been prepped with 50% contrast and 50% heparinized saline infusion and advanced to the distal cervical left ICA. The guidewire was  removed. Good aspiration obtained from the hub of the balloon guide catheter. Gentle control arteriogram performed through the balloon guide catheter demonstrated no evidence of spasms, dissections or of intraluminal filling defects. No change was seen in the intracranial circulation. Over a 0.014 inch standard Synchro micro guidewire with a moderate J configuration, an 021 160 cm microcatheter was advanced in combination with an 071 132 cm Zoom aspiration catheter to supraclinoid left ICA. The micro guidewire was then gently manipulated with a torque device and advanced into the occluded angular branch of the inferior division in the distal M2 M3 segment. The guidewire was removed. Slow aspiration of contrast was noted from the hub of the microcatheter. A gentle contrast injection demonstrates safe position of the tip of the microcatheter which was then connected to continuous heparinized saline infusion. A 3 mm x 40 mm Solitaire X retrieval device was then advanced to the distal end of the microcatheter and deployed in the usual manner. The 071 Zoom aspiration catheter was advanced into the origin of the inferior division. With constant aspiration at the hub of the balloon guide catheter with proximal flow arrest in the left internal carotid artery and aspiration with a Penumbra device the hub of the Zoom aspiration catheter for approximately 2 minutes, the combination of the retrieval device, the microcatheter and the Zoom aspiration catheter was retrieved and removed. A control arteriogram performed following reversal of flow arrest in the left internal carotid artery demonstrated modest improvement in distal flow into the M3 region of the angular branch. However, this was not sustained. A second pass was then made again using combination of the 132 071 Zoom aspiration catheter advanced with an 021 microcatheter again over a 0.014 inch. Access was obtained into the occluded inferior division angular branch  followed by the microcatheter. However, the microcatheter could not be advanced more distally. A gentle control arteriogram performed demonstrated safe position without evidence of any extravasation. At this time the Zoom aspiration catheter was advanced to as close as possible  to the occluded middle cerebral artery branch. Contact aspiration was then performed with an 035 Zoom aspiration catheter which had been advanced and engaged in the occluded segment of the angular branch. Aspiration was then performed with a Penumbra aspiration device at the hub of the 071 Zoom aspiration catheter with gentle aspiration with a 20 mL syringe at the hub of the 035 Zoom aspiration catheter for approximately 2 minutes. After that, the combination of the Zoom aspiration catheter was retrieved and removed, copious amounts of clot was noted in the aspirate from the 071 Zoom aspiration catheter. A control arteriogram performed through the balloon guide catheter in the left internal carotid artery continued to demonstrate near occlusion of the distal left M2 segment into the M3 region. 3 aliquots of 25 mcg of nitroglycerin was then infused through the balloon aspiration catheter in the left internal carotid artery. However, again noted was just a minimal amount of distal flow past the occlusion site. A third pass was made again using just an 021 160 cm microcatheter which was advanced to the site of the occlusion of the angular branch over a 0.014 inch standard Synchro micro guidewire without difficulty. Multiple attempts were made to advance the micro guidewire gently through the occlusive site without success. The microcatheter was removed. Control arteriogram performed through the balloon catheter in the left internal carotid artery continued to demonstrate wide patency of the left middle cerebral artery territory superior divisions and the anterior parietal branch of the inferior division. No significant change was noted in the  occluded angular branch in the distal left M2 M3 region. Throughout the procedure, the patient's blood pressure and neurological status remained stable. No evidence of extravasation was noted. The balloon guide was then removed. The 8 French Pinnacle sheath was removed. Hemostasis was achieved with a 6 French Angio-Seal closure device, and a quick clot compression for about 20 minutes. A CT of the brain demonstrated no evidence of mass effect or midline shift. Contrast was noted in the left anterior perisylvian fissure, and also in the temporoparietal convexity area. Distal pulses remained Dopplerable in both feet unchanged. The patient was extubated. Upon recovery, the patient was able to move all fours spontaneously and to command. She did exhibit difficulty with comprehension. She was then transferred to the neuro ICU for post revascularization care. IMPRESSION: Status post attempted revascularization of occluded distal left MCA inferior division angular branch in the M2 M3 region with 1 pass with a 3 mm x 40 mm Solitaire X retrieval device and contact aspiration, 1 pass with contact aspiration and 1 pass with mechanical thrombolysis with a TICI 2C revascularization. PLAN: Follow-up in the clinic approximately 4 weeks post discharge. Electronically Signed   By: Luanne Bras M.D.   On: 02/28/2021 08:36   DG CHEST PORT 1 VIEW  Result Date: 03/02/2021 CLINICAL DATA:  Cough EXAM: PORTABLE CHEST 1 VIEW COMPARISON:  None FINDINGS: Normal heart size and mediastinal contours. Lung volumes are low. No pleural effusion or edema. No airspace consolidation. Visualized osseous structures are unremarkable. IMPRESSION: Low lung volumes.  No signs of pneumonia. Electronically Signed   By: Kerby Moors M.D.   On: 03/02/2021 09:45   ECHOCARDIOGRAM COMPLETE  Result Date: 02/26/2021    ECHOCARDIOGRAM REPORT   Patient Name:   BILLI SCHUHMACHER Date of Exam: 02/26/2021 Medical Rec #:  [id number]     Height:       67.0 in  Accession #:    [id number]    Weight:  198.9 lb Date of Birth:  09-Jun-1945    BSA:          2.018 m Patient Age:    74 years      BP:           161/66 mmHg Patient Gender: F             HR:           86 bpm. Exam Location:  Inpatient Procedure: 2D Echo, Color Doppler and Cardiac Doppler Indications:    Stroke i63.9  History:        Patient has no prior history of Echocardiogram examinations.  Sonographer:    Irving Burton Senior RDCS Referring Phys: [phone] ERIC LINDZEN IMPRESSIONS  1. Left ventricular ejection fraction, by estimation, is 55 to 60%. The left ventricle has normal function. The left ventricle has no regional wall motion abnormalities. Left ventricular diastolic parameters are consistent with Grade II diastolic dysfunction (pseudonormalization).  2. Right ventricular systolic function is normal. The right ventricular size is mildly enlarged. Tricuspid regurgitation signal is inadequate for assessing PA pressure.  3. Left atrial size was mildly dilated.  4. Right atrial size was mildly dilated.  5. The mitral valve is normal in structure. No evidence of mitral valve regurgitation. No evidence of mitral stenosis.  6. The aortic valve is tricuspid. Aortic valve regurgitation is not visualized. Mild aortic valve sclerosis is present, with no evidence of aortic valve stenosis.  7. The inferior vena cava is dilated in size with >50% respiratory variability, suggesting right atrial pressure of 8 mmHg. FINDINGS  Left Ventricle: Left ventricular ejection fraction, by estimation, is 55 to 60%. The left ventricle has normal function. The left ventricle has no regional wall motion abnormalities. The left ventricular internal cavity size was normal in size. There is  no left ventricular hypertrophy. Left ventricular diastolic parameters are consistent with Grade II diastolic dysfunction (pseudonormalization). Right Ventricle: The right ventricular size is mildly enlarged. No increase in right ventricular wall thickness.  Right ventricular systolic function is normal. Tricuspid regurgitation signal is inadequate for assessing PA pressure. Left Atrium: Left atrial size was mildly dilated. Right Atrium: Right atrial size was mildly dilated. Pericardium: There is no evidence of pericardial effusion. Mitral Valve: The mitral valve is normal in structure. No evidence of mitral valve regurgitation. No evidence of mitral valve stenosis. Tricuspid Valve: The tricuspid valve is normal in structure. Tricuspid valve regurgitation is not demonstrated. Aortic Valve: The aortic valve is tricuspid. Aortic valve regurgitation is not visualized. Mild aortic valve sclerosis is present, with no evidence of aortic valve stenosis. Pulmonic Valve: The pulmonic valve was normal in structure. Pulmonic valve regurgitation is not visualized. Aorta: The aortic root is normal in size and structure. Venous: The inferior vena cava is dilated in size with greater than 50% respiratory variability, suggesting right atrial pressure of 8 mmHg. IAS/Shunts: No atrial level shunt detected by color flow Doppler.  LEFT VENTRICLE PLAX 2D LVIDd:         3.80 cm   Diastology LVIDs:         2.40 cm   LV e' medial:    6.96 cm/s LV PW:         0.80 cm   LV E/e' medial:  11.3 LV IVS:        0.90 cm   LV e' lateral:   9.25 cm/s LVOT diam:     1.90 cm   LV E/e' lateral: 8.5 LV SV:  51 LV SV Index:   25 LVOT Area:     2.84 cm  RIGHT VENTRICLE RV S prime:     15.40 cm/s TAPSE (M-mode): 2.8 cm LEFT ATRIUM             Index        RIGHT ATRIUM           Index LA diam:        1.90 cm 0.94 cm/m   RA Area:     20.60 cm LA Vol (A2C):   45.9 ml 22.75 ml/m  RA Volume:   62.20 ml  30.83 ml/m LA Vol (A4C):   60.5 ml 29.99 ml/m LA Biplane Vol: 53.0 ml 26.27 ml/m  AORTIC VALVE LVOT Vmax:   109.00 cm/s LVOT Vmean:  72.733 cm/s LVOT VTI:    0.179 m  AORTA Ao Root diam: 3.30 cm Ao Asc diam:  3.30 cm MITRAL VALVE MV Area (PHT): 3.61 cm    SHUNTS MV Decel Time: 210 msec    Systemic  VTI:  0.18 m MV E velocity: 78.50 cm/s  Systemic Diam: 1.90 cm MV A velocity: 74.40 cm/s MV E/A ratio:  1.06 Dalton McleanMD Electronically signed by Franki Monte Signature Date/Time: 02/26/2021/1:42:18 PM    Final    ECHO TEE  Result Date: 03/06/2021    TRANSESOPHOGEAL ECHO REPORT   Patient Name:   SHAWNTEE FOUNTAIN Date of Exam: 03/01/2021 Medical Rec #:  [id number]     Height:       67.0 in Accession #:    [id number]    Weight:       198.9 lb Date of Birth:  12/05/1945    BSA:          2.018 m Patient Age:    9 years      BP:           175/132 mmHg Patient Gender: F             HR:           106 bpm. Exam Location:  Inpatient Procedure: Transesophageal Echo, Cardiac Doppler and Color Doppler Indications:     Atrial Fibrillation / Flutter  History:         Patient has prior history of Echocardiogram examinations, most                  recent 02/26/2021.  Sonographer:     Philipp Deputy RDCS Referring Phys:  [id number] Rex Kras Diagnosing Phys: Rex Kras DO PROCEDURE: After discussion of the risks and benefits of a TEE, an informed consent was obtained from the patient. The transesophogeal probe was passed without difficulty through the esophogus of the patient. Imaged were obtained with the patient in a left lateral decubitus position. Sedation performed by different physician. The patient was monitored while under deep sedation. Anesthestetic sedation was provided intravenously by Anesthesiology: 323.25mg  of Propofol, 100mg  of Lidocaine. Image quality was good. The patient's vital signs; including heart rate, blood pressure, and oxygen saturation; remained stable throughout the procedure. Supplementary images were obtained from transthoracic windows as indicated to answer the clinical question. The patient developed no complications during the procedure. A successful direct current cardioversion was performed at 100 joules with 1 attempt. Transitioned to normal sinus post direct current cardioversion;  however, converted back to atrial fibrillation by the time she was in post-procedural holding. IMPRESSIONS  1. Left ventricular ejection fraction, by estimation, is 50 to 55%. The left ventricle has low normal  function. The left ventricle has no regional wall motion abnormalities.  2. Right ventricular systolic function is normal. The right ventricular size is mildly enlarged.  3. Left atrial size was mildly dilated. No left atrial/left atrial appendage thrombus was detected. The LAA emptying velocity was 61 cm/s.  4. Right atrial size was mildly dilated.  5. The mitral valve is normal in structure. Mild mitral valve regurgitation. No evidence of mitral stenosis.  6. The aortic valve is normal in structure. Aortic valve regurgitation is not visualized. No aortic stenosis is present.  7. There is mild (Grade II) plaque involving the descending aorta.  8. Agitated saline contrast bubble study was negative, with no evidence of any interatrial shunt. FINDINGS  Left Ventricle: Left ventricular ejection fraction, by estimation, is 50 to 55%. The left ventricle has low normal function. The left ventricle has no regional wall motion abnormalities. The left ventricular internal cavity size was normal in size. Right Ventricle: The right ventricular size is mildly enlarged. No increase in right ventricular wall thickness. Right ventricular systolic function is normal. Left Atrium: Left atrial size was mildly dilated. No left atrial/left atrial appendage thrombus was detected. The LAA emptying velocity was 61 cm/s. Right Atrium: Right atrial size was mildly dilated. Pericardium: Trivial pericardial effusion is present. Mitral Valve: The mitral valve is normal in structure. Mild mitral valve regurgitation. No evidence of mitral valve stenosis. Tricuspid Valve: The tricuspid valve is normal in structure. Tricuspid valve regurgitation is mild . No evidence of tricuspid stenosis. Aortic Valve: The aortic valve is normal in  structure. Aortic valve regurgitation is not visualized. No aortic stenosis is present. Pulmonic Valve: The pulmonic valve was grossly normal. Pulmonic valve regurgitation is not visualized. No evidence of pulmonic stenosis. Aorta: The aortic root and ascending aorta are structurally normal, with no evidence of dilitation. There is mild (Grade II) plaque involving the descending aorta. IAS/Shunts: There is redundancy of the interatrial septum. The atrial septum is grossly normal. Agitated saline contrast was given intravenously to evaluate for intracardiac shunting. Agitated saline contrast bubble study was negative, with no evidence of any interatrial shunt.  LEFT VENTRICLE PLAX 2D LVOT diam:     2.30 cm LVOT Area:     4.15 cm   SHUNTS Systemic Diam: 2.30 cm Sunit Tolia DO Electronically signed by Rex Kras DO Signature Date/Time: 03/06/2021/12:14:56 AM    Final    IR PERCUTANEOUS ART THROMBECTOMY/INFUSION INTRACRANIAL INC DIAG ANGIO  Result Date: 02/28/2021 INDICATION: New onset aphasia. Occluded left middle cerebral artery inferior division in the distal M2 M3 region on CT angiogram of the head and neck. EXAM: 1. EMERGENT LARGE VESSEL OCCLUSION THROMBOLYSIS (anterior CIRCULATION) COMPARISON:  CT angiogram of the head and neck of 02/25/2021. MEDICATIONS: Ancef 2 g IV antibiotic was administered within 1 hour of the procedure. ANESTHESIA/SEDATION: General anesthesia. CONTRAST:  Omnipaque 300 approximately 90 cc. FLUOROSCOPY TIME:  Fluoroscopy Time: 49 minutes 0 seconds (2707 mGy). COMPLICATIONS: None immediate. TECHNIQUE: Following a full explanation of the procedure along with the potential associated complications, an informed witnessed consent was obtained. The risks of intracranial hemorrhage of 10%, worsening neurological deficit, ventilator dependency, death and inability to revascularize were all reviewed in detail with the patient's son. The patient was then put under general anesthesia by the  Department of Anesthesiology at [facility]. The right groin was prepped and draped in the usual sterile fashion. Thereafter using modified Seldinger technique, transfemoral access into the right common femoral artery was obtained without difficulty.  Over a 0.035 inch guidewire an8 [place] Pinnacle 25 cm sheath was inserted. Through this, and also over a 0.035 inch guidewire a 5 [place] JB 1 catheter was advanced to the aortic arch region and selectively positioned in the left common carotid artery. FINDINGS: The left common carotid arteriogram demonstrates the left external carotid artery and its major branches to be widely patent. The left internal carotid artery at the bulb to the skull base is widely patent. The petrous, the cavernous and the supraclinoid segments are widely patent. A left posterior communicating artery is seen opacifying the left posterior cerebral artery distribution. The left anterior cerebral artery opacifies into the capillary and venous phases. The left middle cerebral artery M1 segment and superior division is widely patent. The inferior division superior parietal branch demonstrates wide patency into the capillary and venous phases. Complete angiographic occlusion is seen of the inferior division in the distal M2 M3 segment. PROCEDURE: Over a 0.035 inch exchange Roadrunner guidewire, the 5 [place] diagnostic catheter was exchanged for a 95 cm 087 balloon guide catheter which had been prepped with 50% contrast and 50% heparinized saline infusion and advanced to the distal cervical left ICA. The guidewire was removed. Good aspiration obtained from the hub of the balloon guide catheter. Gentle control arteriogram performed through the balloon guide catheter demonstrated no evidence of spasms, dissections or of intraluminal filling defects. No change was seen in the intracranial circulation. Over a 0.014 inch standard Synchro micro guidewire with a moderate J configuration, an 021 160  cm microcatheter was advanced in combination with an 071 132 cm Zoom aspiration catheter to supraclinoid left ICA. The micro guidewire was then gently manipulated with a torque device and advanced into the occluded angular branch of the inferior division in the distal M2 M3 segment. The guidewire was removed. Slow aspiration of contrast was noted from the hub of the microcatheter. A gentle contrast injection demonstrates safe position of the tip of the microcatheter which was then connected to continuous heparinized saline infusion. A 3 mm x 40 mm Solitaire X retrieval device was then advanced to the distal end of the microcatheter and deployed in the usual manner. The 071 Zoom aspiration catheter was advanced into the origin of the inferior division. With constant aspiration at the hub of the balloon guide catheter with proximal flow arrest in the left internal carotid artery and aspiration with a Penumbra device the hub of the Zoom aspiration catheter for approximately 2 minutes, the combination of the retrieval device, the microcatheter and the Zoom aspiration catheter was retrieved and removed. A control arteriogram performed following reversal of flow arrest in the left internal carotid artery demonstrated modest improvement in distal flow into the M3 region of the angular branch. However, this was not sustained. A second pass was then made again using combination of the 132 071 Zoom aspiration catheter advanced with an 021 microcatheter again over a 0.014 inch. Access was obtained into the occluded inferior division angular branch followed by the microcatheter. However, the microcatheter could not be advanced more distally. A gentle control arteriogram performed demonstrated safe position without evidence of any extravasation. At this time the Zoom aspiration catheter was advanced to as close as possible to the occluded middle cerebral artery branch. Contact aspiration was then performed with an 035 Zoom  aspiration catheter which had been advanced and engaged in the occluded segment of the angular branch. Aspiration was then performed with a Penumbra aspiration device at the hub of the 071 Zoom  aspiration catheter with gentle aspiration with a 20 mL syringe at the hub of the 035 Zoom aspiration catheter for approximately 2 minutes. After that, the combination of the Zoom aspiration catheter was retrieved and removed, copious amounts of clot was noted in the aspirate from the 071 Zoom aspiration catheter. A control arteriogram performed through the balloon guide catheter in the left internal carotid artery continued to demonstrate near occlusion of the distal left M2 segment into the M3 region. 3 aliquots of 25 mcg of nitroglycerin was then infused through the balloon aspiration catheter in the left internal carotid artery. However, again noted was just a minimal amount of distal flow past the occlusion site. A third pass was made again using just an 021 160 cm microcatheter which was advanced to the site of the occlusion of the angular branch over a 0.014 inch standard Synchro micro guidewire without difficulty. Multiple attempts were made to advance the micro guidewire gently through the occlusive site without success. The microcatheter was removed. Control arteriogram performed through the balloon catheter in the left internal carotid artery continued to demonstrate wide patency of the left middle cerebral artery territory superior divisions and the anterior parietal branch of the inferior division. No significant change was noted in the occluded angular branch in the distal left M2 M3 region. Throughout the procedure, the patient's blood pressure and neurological status remained stable. No evidence of extravasation was noted. The balloon guide was then removed. The 8 French Pinnacle sheath was removed. Hemostasis was achieved with a 6 French Angio-Seal closure device, and a quick clot compression for about 20  minutes. A CT of the brain demonstrated no evidence of mass effect or midline shift. Contrast was noted in the left anterior perisylvian fissure, and also in the temporoparietal convexity area. Distal pulses remained Dopplerable in both feet unchanged. The patient was extubated. Upon recovery, the patient was able to move all fours spontaneously and to command. She did exhibit difficulty with comprehension. She was then transferred to the neuro ICU for post revascularization care. IMPRESSION: Status post attempted revascularization of occluded distal left MCA inferior division angular branch in the M2 M3 region with 1 pass with a 3 mm x 40 mm Solitaire X retrieval device and contact aspiration, 1 pass with contact aspiration and 1 pass with mechanical thrombolysis with a TICI 2C revascularization. PLAN: Follow-up in the clinic approximately 4 weeks post discharge. Electronically Signed   By: Luanne Bras M.D.   On: 02/28/2021 08:36   CT HEAD CODE STROKE WO CONTRAST  Result Date: 02/25/2021 CLINICAL DATA:  Code stroke. EXAM: CT HEAD WITHOUT CONTRAST TECHNIQUE: Contiguous axial images were obtained from the base of the skull through the vertex without intravenous contrast. COMPARISON:  None. FINDINGS: Brain: No acute intracranial hemorrhage, mass effect, or edema. Gray-white differentiation is preserved. Age-indeterminate small vessel infarct of the left gangliocapsular region. Additional patchy low-density in the supratentorial white matter is nonspecific but may reflect mild chronic microvascular ischemic changes. Ventricles and sulci are within normal limits in size and configuration. No extra-axial collection. Vascular: No hyperdense vessel. Mild intracranial atherosclerotic calcification at the skull base. Skull: Unremarkable. Sinuses/Orbits: Aerated. Other: Mastoid air cells are clear. ASPECTS ([place] Stroke Program Early CT Score) - Ganglionic level infarction (caudate, lentiform nuclei, internal  capsule, insula, M1-M3 cortex): 7 - Supraganglionic infarction (M4-M6 cortex): 3 Total score (0-10 with 10 being normal): 10 IMPRESSION: There is no acute intracranial hemorrhage or evidence of acute infarction. ASPECT score is 10. Age-indeterminate small  vessel infarct of the left gangliocapsular region. These results were called by telephone at the time of interpretation on 02/25/2021 at 2:37 pm to provider Tanda Rockers , who verbally acknowledged these results. Electronically Signed   By: Guadlupe Spanish M.D.   On: 02/25/2021 14:39   VAS [place] LOWER EXTREMITY VENOUS (DVT)  Result Date: 02/26/2021  Lower Venous DVT Study Patient Name:  AMARIE VILES  Date of Exam:   02/26/2021 Medical Rec #: [id number]      Accession #:    [id number] Date of Birth: 1945/08/15     Patient Gender: F Patient Age:   59 years Exam Location:  [facility] Procedure:      VAS [place] LOWER EXTREMITY VENOUS (DVT) Referring Phys: Scheryl Marten XU --------------------------------------------------------------------------------  Indications: Stroke.  Comparison Study: No prior study Performing Technologist: Gertie Fey MHA, RDMS, RVT, RDCS  Examination Guidelines: A complete evaluation includes B-mode imaging, spectral Doppler, color Doppler, and power Doppler as needed of all accessible portions of each vessel. Bilateral testing is considered an integral part of a complete examination. Limited examinations for reoccurring indications may be performed as noted. The reflux portion of the exam is performed with the patient in reverse Trendelenburg.  +---------+---------------+---------+-----------+----------+--------------+ RIGHT    CompressibilityPhasicitySpontaneityPropertiesThrombus Aging +---------+---------------+---------+-----------+----------+--------------+ CFV      Full           Yes      Yes                                 +---------+---------------+---------+-----------+----------+--------------+ SFJ      Full                                                         +---------+---------------+---------+-----------+----------+--------------+ FV Prox  Full                                                        +---------+---------------+---------+-----------+----------+--------------+ FV Mid   Full                                                        +---------+---------------+---------+-----------+----------+--------------+ FV DistalFull                                                        +---------+---------------+---------+-----------+----------+--------------+ PFV      Full                                                        +---------+---------------+---------+-----------+----------+--------------+ POP      Full           Yes  Yes                                 +---------+---------------+---------+-----------+----------+--------------+ PTV      Full                                                        +---------+---------------+---------+-----------+----------+--------------+ PERO     Full                                                        +---------+---------------+---------+-----------+----------+--------------+   +---------+---------------+---------+-----------+----------+--------------+ LEFT     CompressibilityPhasicitySpontaneityPropertiesThrombus Aging +---------+---------------+---------+-----------+----------+--------------+ CFV      Full           Yes      Yes                                 +---------+---------------+---------+-----------+----------+--------------+ SFJ      Full                                                        +---------+---------------+---------+-----------+----------+--------------+ FV Prox  Full                                                        +---------+---------------+---------+-----------+----------+--------------+ FV Mid   Full                                                         +---------+---------------+---------+-----------+----------+--------------+ FV DistalFull                                                        +---------+---------------+---------+-----------+----------+--------------+ PFV      Full                                                        +---------+---------------+---------+-----------+----------+--------------+ POP      Full           Yes      Yes                                 +---------+---------------+---------+-----------+----------+--------------+ PTV      Full                                                        +---------+---------------+---------+-----------+----------+--------------+  PERO     Full                                                        +---------+---------------+---------+-----------+----------+--------------+     Summary: RIGHT: - There is no evidence of deep vein thrombosis in the lower extremity.  - No cystic structure found in the popliteal fossa.  LEFT: - There is no evidence of deep vein thrombosis in the lower extremity.  - No cystic structure found in the popliteal fossa.  *See table(s) above for measurements and observations. Electronically signed by Deitra Mayo MD on 02/26/2021 at 4:59:49 PM.    Final    CT ANGIO HEAD NECK W WO CM W PERF (CODE STROKE)  Result Date: 02/25/2021 CLINICAL DATA:  Neuro deficit, acute, stroke suspected EXAM: CT ANGIOGRAPHY HEAD AND NECK CT PERFUSION BRAIN TECHNIQUE: Multidetector CT imaging of the head and neck was performed using the standard protocol during bolus administration of intravenous contrast. Multiplanar CT image reconstructions and MIPs were obtained to evaluate the vascular anatomy. Carotid stenosis measurements (when applicable) are obtained utilizing NASCET criteria, using the distal internal carotid diameter as the denominator. Multiphase CT imaging of the brain was performed following IV bolus contrast injection. Subsequent  parametric perfusion maps were calculated using RAPID software. CONTRAST:  117mL OMNIPAQUE IOHEXOL 350 MG/ML SOLN COMPARISON:  None. FINDINGS: CTA NECK Aortic arch: Trace calcified plaque along the included arch. Great vessel origins are patent. Right carotid system: Patent.  No stenosis. Left carotid system: Patent. Trace mixed plaque at the bifurcation. No stenosis. Vertebral arteries: Patent and codominant.  No stenosis. Skeleton: Mild cervical spine degenerative changes. Degenerative changes at the temporomandibular joints. Other neck: Unremarkable. Upper chest: Centrilobular emphysema. Nodular but somewhat tubular opacities at the left apex probably reflect impacted airways. Review of the MIP images confirms the above findings CTA HEAD Anterior circulation: Intracranial internal carotid arteries are patent with mild calcified plaque. Left M1 MCA is patent. There is occlusion of a distal left M2 MCA branch within the posterior sylvian fissure. This may reconstitute more distally. Anterior and right middle cerebral arteries are patent. Posterior circulation: Intracranial vertebral arteries are patent. Basilar artery is patent. Major cerebellar artery origins are patent. A left posterior communicating artery is present. Posterior cerebral arteries are patent. Venous sinuses: Patent as allowed by contrast bolus timing. Review of the MIP images confirms the above findings CT Brain Perfusion Findings: CBF (<30%) Volume: 76mL Perfusion (Tmax>6.0s) volume: 92mL Mismatch Volume: 25mL Infarction Location: Area of infarction and most territory at risk within the posterior left MCA territory. There is some artifactual calculated territory at risk within the inferior frontal lobes. 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 May reconstitute more distally. Perfusion imaging demonstrates 3 mL of core infarction and 18 mL of penumbra within the posterior left MCA territory. The penumbra volume  is overestimated as there is some artifact in the inferior frontal lobes included in the number. No occlusion or hemodynamically significant stenosis in the neck. These results were provided by telephone at the time of interpretation on 02/25/2021 at 3:55 pm to provider [facility] , who verbally acknowledged these results. Electronically Signed   By: Macy Mis M.D.   On: 02/25/2021 16:01    Labs:  Basic Metabolic Panel: Recent Labs  Lab 03/17/21  0710  NA 138  K 4.6  CL 105  CO2 25  GLUCOSE 130*  BUN 14  CREATININE 1.10*  CALCIUM 9.0    CBC: Recent Labs  Lab 03/17/21 0710  WBC 8.0  HGB 12.0  HCT 39.1  MCV 96.8  PLT 336    CBG: No results for input(s): GLUCAP in the last 168 hours.  Family history.  Positive for hypertension as well as hyperlipidemia.  Denies any colon cancer esophageal cancer or rectal cancer  Brief HPI:   Glanda Cozad is a 75 y.o. right-handed female with history of COPD with intermittent oxygen at home hypertension and hyperlipidemia.  Patient lives alone independent prior to admission.  Presented 02/25/2021 [facility] with expressive and receptive aphasia.  CT of the head showed no acute intracranial hemorrhage or evidence of acute infarction.  Age-indeterminate small vessel infarcts of the left ganglia capsular region.  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  Patient was administered TNK.  CTA revealed a left M2/M3 occlusion patient is transferred to [facility] course four-vessel angiogram underwent partial recanalization with reocclusion followed by mechanical thrombolysis achieving revascularization.  Hospital course 10/19 developed atrial fibrillation RVR started on Cardizem however RVR persisted cardiology service follow-up underwent cardioversion 10/21 unfortunately back into A. fib again cardioversion 03/05/2021 sustaining normal sinus rhythm.  TEE ejection fraction of 50 to 55% no thrombus or  vegetation.  Maintained on Eliquis for CVA prophylaxis atrial fibrillation.  Close monitoring of blood pressure bouts of hypotension placed on ProAmatine.  Tolerating a regular consistency diet.  Therapy evaluations completed due to patient decreased functional mobility and aphasia was admitted for a comprehensive rehab program.   Hospital Course: Yeslie Herpin was admitted to rehab 03/07/2021 for inpatient therapies to consist of PT, ST and OT at least three hours five days a week. Past admission physiatrist, therapy team and rehab RN have worked together to provide customized collaborative inpatient rehab.  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  Maintained on Eliquis.  Patient would follow-up with neurology services.  Atrial fibrillation RVR cardiology service follow-up amiodarone verapamil as advised cardiac rate controlled.  Bouts of hypotension ProAmatine as directed.  History of COPD intermittent oxygen use continue nebulizers.  GERD placed on PPI.  Bouts of constipation resolved with laxative assistance.   Blood pressures were monitored on TID basis and soft and monitored      Rehab course: During patient's stay in rehab weekly team conferences were held to monitor patient's progress, set goals and discuss barriers to discharge. At admission, patient required minimal assist 30 feet rolling walker supervision sit to stand  Physical exam.  Blood pressure 110/76 pulse 62 temperature 92 respirations 20 oxygen saturation 96% room air Constitutional.  No acute distress HEENT Head.  Normocephalic and atraumatic Eyes.  Pupils round and reactive to light no discharge without nystagmus Neck.  Supple nontender no JVD without thyromegaly Cardiac regular rate rhythm not extra sounds or murmur heard Abdomen.  Soft nontender positive bowel sounds without rebound Respiratory effort normal no respiratory  distress without wheeze Musculoskeletal.  Normal range of motion no rigidity Comments.  Right side 5 -/5 in biceps triceps wrist extension grip and finger abduction as well as hip flexors knee extension dorsi plantarflexion.  Left side 5/5 in same muscles Neurological.  Patient alert makes eye contact with examiner aphasic receptive greater than expressive.  She did not follow commands.  He/She  has had improvement in activity tolerance, balance, postural control as well as ability to compensate for deficits. He/She has had improvement in functional use RUE/LUE  and RLE/LLE as well as improvement in awareness.  Ambulating without assist device contact-guard monitoring oxygen saturations.  ADLs working with activity tolerance transfer training functional cognition preparation of ADLs.  Reviewed ADL performance and bathroom transfers with family status and overall supervision level without assistive device.  Able to doff and don socks with supervision and donned teds with moderate assist.  Was recommended 24/7 supervision for patient's safety.  Speech therapy follow-up for aphasia she was quite emotional she was able to communicate some simple needs.  Full family teaching completed plan discharged home       Disposition: Discharge to home    Diet: Regular  Special Instructions: No driving smoking or alcohol  Continue oxygen use as prior to admission.  Medications at discharge 1.  Tylenol as needed 2.  Amiodarone 200 mg p.o. daily 3.  Eliquis 5 mg p.o. twice daily 4.  Lipitor 10 mg p.o. daily 5.  Magnesium oxide 400 mg p.o. daily 6.  ProAmatine 2.5 mg p.o. twice daily with meals 7.  Dulera 2 puffs twice daily 8.  Multivitamin daily 9.  Protonix 20 mg p.o. daily 10.  Senokot S2 tabs p.o. twice daily hold for loose stools 11.Spiriva 18 mcg daily  30-35 minutes were spent completing discharge summary and discharge planning  Discharge Instructions     Ambulatory referral to Neurology    Complete by: As directed    An appointment is requested in approximately: 4 weeks left MCA infarction   Ambulatory referral to Physical Medicine Rehab   Complete by: As directed    Moderate complexity follow-up 1 to 2 weeks left MCA infarction        Follow-up Information     Kirsteins, Luanna Salk, MD Follow up.   Specialty: Physical Medicine and Rehabilitation Why: Office to call for appointment Contact information: [place] [address] [phone]         Rex Kras, DO Follow up on 04/11/2021.   Specialties: Cardiology, Vascular Surgery Why: @1 :30pm Afib, recent stroke & hospitalization. Contact information: [place] [address] [phone]         Fannie Knee, MD Follow up.   Specialty: Internal Medicine Contact information: [address] [place] [address] [phone]                 Signed: Cathlyn Parsons 03/19/2021, 7:13 AM

## 2021-03-18 ENCOUNTER — Other Ambulatory Visit (HOSPITAL_COMMUNITY): Payer: Self-pay

## 2021-03-18 DIAGNOSIS — R4701 Aphasia: Secondary | ICD-10-CM

## 2021-03-18 DIAGNOSIS — I952 Hypotension due to drugs: Secondary | ICD-10-CM

## 2021-03-18 DIAGNOSIS — I4891 Unspecified atrial fibrillation: Secondary | ICD-10-CM

## 2021-03-18 MED ORDER — ATORVASTATIN CALCIUM 10 MG PO TABS
10.0000 mg | ORAL_TABLET | Freq: Every day | ORAL | 0 refills | Status: AC
  Filled 2021-03-18: qty 30, 30d supply, fill #0

## 2021-03-18 MED ORDER — SENNOSIDES-DOCUSATE SODIUM 8.6-50 MG PO TABS: 2.0000 | ORAL_TABLET | Freq: Two times a day (BID) | ORAL | Status: AC

## 2021-03-18 MED ORDER — UMECLIDINIUM BROMIDE 62.5 MCG/ACT IN AEPB
1.0000 | INHALATION_SPRAY | Freq: Every day | RESPIRATORY_TRACT | 0 refills | Status: DC
  Filled 2021-03-18: qty 30, 30d supply, fill #0

## 2021-03-18 MED ORDER — DULERA 100-5 MCG/ACT IN AERO
2.0000 | INHALATION_SPRAY | Freq: Two times a day (BID) | RESPIRATORY_TRACT | 0 refills | Status: AC
  Filled 2021-03-18: qty 13, 30d supply, fill #0

## 2021-03-18 MED ORDER — APIXABAN 5 MG PO TABS
5.0000 mg | ORAL_TABLET | Freq: Two times a day (BID) | ORAL | 0 refills | Status: DC
  Filled 2021-03-18: qty 60, 30d supply, fill #0

## 2021-03-18 MED ORDER — AMIODARONE HCL 200 MG PO TABS
200.0000 mg | ORAL_TABLET | Freq: Every day | ORAL | 0 refills | Status: DC
  Filled 2021-03-18: qty 30, 30d supply, fill #0

## 2021-03-18 MED ORDER — MAGNESIUM OXIDE 400 MG PO TABS
400.0000 mg | ORAL_TABLET | Freq: Every day | ORAL | 0 refills | Status: DC
  Filled 2021-03-18: qty 30, 30d supply, fill #0

## 2021-03-18 MED ORDER — SPIRIVA HANDIHALER 18 MCG IN CAPS
18.0000 ug | ORAL_CAPSULE | Freq: Every day | RESPIRATORY_TRACT | 0 refills | Status: DC
  Filled 2021-03-18 – 2021-03-19 (×2): qty 30, 30d supply, fill #0

## 2021-03-18 MED ORDER — OMEPRAZOLE 20 MG PO CPDR
20.0000 mg | DELAYED_RELEASE_CAPSULE | Freq: Every day | ORAL | 0 refills | Status: AC
  Filled 2021-03-18: qty 30, 30d supply, fill #0

## 2021-03-18 NOTE — Progress Notes (Signed)
Patient ID: Michelle Dean, female   DOB: 04/27/1946, 75 y.o.   MRN: [id number]  Patient declined by [facility]

## 2021-03-18 NOTE — Plan of Care (Signed)
  Problem: RH Balance Goal: LTG Patient will maintain dynamic standing balance (PT) Description: LTG:  Patient will maintain dynamic standing balance with assistance during mobility activities (PT) Outcome: Completed/Met   Problem: Sit to Stand Goal: LTG:  Patient will perform sit to stand with assistance level (PT) Description: LTG:  Patient will perform sit to stand with assistance level (PT) Outcome: Completed/Met   Problem: RH Bed Mobility Goal: LTG Patient will perform bed mobility with assist (PT) Description: LTG: Patient will perform bed mobility with assistance, with/without cues (PT). Outcome: Completed/Met   Problem: RH Bed to Chair Transfers Goal: LTG Patient will perform bed/chair transfers w/assist (PT) Description: LTG: Patient will perform bed to chair transfers with assistance (PT). Outcome: Completed/Met   Problem: RH Car Transfers Goal: LTG Patient will perform car transfers with assist (PT) Description: LTG: Patient will perform car transfers with assistance (PT). Outcome: Completed/Met   Problem: RH Furniture Transfers Goal: LTG Patient will perform furniture transfers w/assist (OT/PT) Description: LTG: Patient will perform furniture transfers  with assistance (OT/PT). Outcome: Completed/Met   Problem: RH Ambulation Goal: LTG Patient will ambulate in home environment (PT) Description: LTG: Patient will ambulate in home environment, # of feet with assistance (PT). Outcome: Completed/Met Goal: LTG Patient will ambulate in community environment (PT) Description: LTG: Patient will ambulate in community environment, # of feet with assistance (PT). Outcome: Completed/Met   Problem: RH Stairs Goal: LTG Patient will ambulate up and down stairs w/assist (PT) Description: LTG: Patient will ambulate up and down # of stairs with assistance (PT) Outcome: Completed/Met

## 2021-03-18 NOTE — Progress Notes (Signed)
Patient ID: Michelle Dean, female   DOB: [birth year], 75 y.o.   MRN: [id number]  Patient referral sent to [facility]

## 2021-03-18 NOTE — Progress Notes (Signed)
PROGRESS NOTE   Subjective/Complaints: No new issues. Up in chair. Feels comfortable.  ROS: limited due to language/communication   Objective:   No results found. Recent Labs    03/17/21 0710  WBC 8.0  HGB 12.0  HCT 39.1  PLT 336   Recent Labs    03/17/21 0710  NA 138  K 4.6  CL 105  CO2 25  GLUCOSE 130*  BUN 14  CREATININE 1.10*  CALCIUM 9.0    Intake/Output Summary (Last 24 hours) at 03/18/2021 0919 Last data filed at 03/17/2021 1800 Gross per 24 hour  Intake 356 ml  Output --  Net 356 ml        Physical Exam: Vital Signs Blood pressure (!) 145/62, pulse 68, temperature 97.9 F (36.6 C), temperature source Oral, resp. rate 18, height 5\' 7"  (1.702 m), weight 90 kg, SpO2 96 %.  Constitutional: No distress . Vital signs reviewed. HEENT: NCAT, EOMI, oral membranes moist Neck: supple Cardiovascular: RRR without murmur. No JVD    Respiratory/Chest: CTA Bilaterally without wheezes or rales. Normal effort    GI/Abdomen: BS +, non-tender, non-distended Ext: no clubbing, cyanosis, or edema Psych: pleasant and cooperative  Skin: No evidence of breakdown, no evidence of rash Neurologic: Cranial nerves II through XII intact, motor strength is 5/5 in bilateral deltoid, bicep, tricep, grip, hip flexor, knee extensors, ankle dorsiflexor and plantar flexor Sensory exam unable to assess due to aphasia Fluent aphasia, was appropriate with several of her responses though. Musculoskeletal: Full range of motion in all 4 extremities. No joint swelling    Assessment/Plan: 1. Functional deficits which require 3+ hours per day of interdisciplinary therapy in a comprehensive inpatient rehab setting. Physiatrist is providing close team supervision and 24 hour management of active medical problems listed below. Physiatrist and rehab team continue to assess barriers to discharge/monitor patient progress toward functional and  medical goals  Care Tool:  Bathing    Body parts bathed by patient: Right arm, Left arm, Chest, Abdomen, Front perineal area, Right upper leg, Buttocks, Left upper leg, Right lower leg, Face, Left lower leg         Bathing assist Assist Level: Supervision/Verbal cueing     Upper Body Dressing/Undressing Upper body dressing   What is the patient wearing?: Hospital gown only    Upper body assist Assist Level: Set up assist    Lower Body Dressing/Undressing Lower body dressing      What is the patient wearing?: Hospital gown only     Lower body assist Assist for lower body dressing: Set up assist     Toileting Toileting    Toileting assist Assist for toileting: Supervision/Verbal cueing     Transfers Chair/bed transfer  Transfers assist     Chair/bed transfer assist level: Contact Guard/Touching assist Chair/bed transfer assistive device: Programmer, multimedia   Ambulation assist      Assist level: Contact Guard/Touching assist Assistive device: No Device Max distance: 156ft   Walk 10 feet activity   Assist     Assist level: Contact Guard/Touching assist Assistive device: Walker-rolling   Walk 50 feet activity   Assist    Assist level: Contact  Guard/Touching assist Assistive device: Walker-rolling    Walk 150 feet activity   Assist Walk 150 feet activity did not occur: Safety/medical concerns         Walk 10 feet on uneven surface  activity   Assist Walk 10 feet on uneven surfaces activity did not occur: Safety/medical concerns         Wheelchair     Assist Is the patient using a wheelchair?: Yes Type of Wheelchair: Manual    Wheelchair assist level: Total Assistance - Patient < 25% Max wheelchair distance: 300 ft    Wheelchair 50 feet with 2 turns activity    Assist        Assist Level: Total Assistance - Patient < 25%   Wheelchair 150 feet activity     Assist      Assist Level: Total  Assistance - Patient < 25%   Blood pressure (!) 145/62, pulse 68, temperature 97.9 F (36.6 C), temperature source Oral, resp. rate 18, height 5\' 7"  (1.702 m), weight 90 kg, SpO2 96 %.  Medical Problem List and Plan: 1.   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 That doesn't appear the case  2.  Antithrombotics: -DVT/anticoagulation:  Pharmaceutical: Other (comment) Eliquis             -antiplatelet therapy: N/A 3. Pain Management: Tylenol as needed 4. Mood: Provide emotional support             -antipsychotic agents: N/A 5. Neuropsych: This patient is not capable of making decisions on her own behalf. 6. Skin/Wound Care: Routine skin checks 7. Fluids/Electrolytes/Nutrition: I personally reviewed the patient's labs today.  11/7 8.  Atrial fibrillation with RVR.  Status post cardioversion.  Follow-up cardiology services.  Amiodarone 200 mg  daily, verapamil 40 mg every 8 hours.  -HR controlled 9.  Hypotension.  Currently on ProAmatine5mg  3 times daily.hope to wean as BP allows  Vitals:   03/18/21 0349 03/18/21 0807  BP: (!) 108/55 (!) 145/62  Pulse: 66 68  Resp:    Temp:    SpO2: 93% 96%   HR and BP controlled but having some diziness and has been on Proamatine, off  [place] per cardiology last dose was ~24h ago.  proamatine stopped now that pt off of verapamil  -HR/BP controlled 10.  Hyperlipidemia.  Lipitor 11.  History of COPD.  Monitor oxygen saturations.  Continue nebulizers  10/30- on home O2 regularly- con't reegimen 12.  Leukocytosis.  Chest x-ray negative.  Completed course of doxycycline  13.  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   Hx GERD restart PPI as pt with intermittent sx 15.  Constipation should improve off verapamil , will start senna s bid, dulc supp today   -moved bowels 11/6   LOS: 11 days A FACE TO FACE EVALUATION WAS PERFORMED  13/6 03/18/2021, 9:19 AM

## 2021-03-18 NOTE — Plan of Care (Signed)

## 2021-03-18 NOTE — Plan of Care (Signed)
  Problem: RH Comprehension Communication Goal: LTG Patient will comprehend basic/complex auditory (SLP) Description: LTG: Patient will comprehend basic/complex auditory information with cues (SLP). Outcome: Completed/Met   Problem: RH Expression Communication Goal: LTG Patient will verbally express basic/complex needs(SLP) Description: LTG:  Patient will verbally express basic/complex needs, wants or ideas with cues  (SLP) Outcome: Completed/Met Goal: LTG Patient will increase word finding of common (SLP) Description: LTG:  Patient will increase word finding of common objects/daily info/abstract thoughts with cues using compensatory strategies (SLP). Outcome: Completed/Met   Problem: RH Problem Solving Goal: LTG Patient will demonstrate problem solving for (SLP) Description: LTG:  Patient will demonstrate problem solving for basic/complex daily situations with cues  (SLP) Outcome: Completed/Met   Problem: RH Awareness Goal: LTG: Patient will demonstrate awareness during functional activites type of (SLP) Description: LTG: Patient will demonstrate awareness during functional activites type of (SLP) Outcome: Completed/Met

## 2021-03-18 NOTE — Progress Notes (Signed)
Physical Therapy Discharge Summary  Patient Details  Name: Michelle Dean MRN: [id number] Date of Birth: 26-Jun-1945  Today's Date: 03/18/2021 PT Individual Time: 1000-1055; 1300-1340 PT Individual Time Calculation (min): 55 min and 40 mins  Session 1: Patient received sitting up in recliner, agreeable to PT. She denies pain. Patient ambulating to therapy gym on 4W with no AD and PT managing O2 tank, independent otherwise. O2 100% on 1L. Patient completing dc assessment as noted below. Grossly independent. Per RN, okay to trial no supplemental O2 during session. O2 turned off for seated dc assessment, O2 98-100%. She remains globally aphasic and has difficulty following cues accurately. Seated therex completed with 3# ankle weights: LAQ, marches, DF. PF, HSC. Difficulty following cues for simple movements even with visual demonstration. O2 remained >96% with no supplemental O2. Patient ambulating >280ft with no AD and no O2. 94% upon arriving back in her room. Patient able to use restroom independently. Remaining up in recliner, seatbelt alarm on, call light within reach.   Session 2: Patient received sitting up in recliner, agreeable to PT. She denies pain. Patient ambulating >376ft to dayroom on 4W with no AD and independently. Improving gait speed noted. PT unable to assess RPE due to aphasia, but O2 >96% throughout session on RA. Patient completed 4x2 min bouts on NuStep for improved CV endurance. She did require rest breaks between due to fatigue. Patient returning to room. Up in recliner, seatbelt alarm on, call light within reach, visitors at bedside.    Patient has met 9 of 9 long term goals due to improved activity tolerance, improved balance, improved postural control, increased strength, ability to compensate for deficits, functional use of  right upper extremity and right lower extremity, improved attention, improved awareness, and improved coordination.  Patient to discharge at an ambulatory  level Modified Independent.   Patient's care partner is independent to provide the necessary cognitive assistance at discharge.   Recommendation:  Patient will benefit from ongoing skilled PT services in home health setting to continue to advance safe functional mobility, address ongoing impairments in awareness, dynamic balance, dual tasking, and minimize fall risk.  Equipment: No equipment provided  Reasons for discharge: treatment goals met and discharge from hospital  Patient/family agrees with progress made and goals achieved: Yes  PT Discharge Precautions/Restrictions Precautions Precautions: Fall Precaution Comments: receptive>expressive aphasia, 1L O2 via [place] if SpO2 <94%, TEDs daily Restrictions Weight Bearing Restrictions: No Vital Signs Therapy Vitals Pulse Rate: 68 BP: (!) 145/62 Oxygen Therapy SpO2: 96 % O2 Device: Nasal Cannula O2 Flow Rate (L/min): 1 L/min Pain Pain Assessment Pain Scale: 0-10 Pain Score: 0-No pain Pain Interference Pain Interference Pain Effect on Sleep: 0. Does not apply - I have not had any pain or hurting in the past 5 days Pain Interference with Therapy Activities: 0. Does not apply - I have not received rehabilitationtherapy in the past 5 days Pain Interference with Day-to-Day Activities: 1. Rarely or not at all Vision/Perception  Vision - History Ability to See in Adequate Light: 0 Adequate Perception Perception: Within Functional Limits Praxis Praxis: Intact  Cognition Overall Cognitive Status: Difficult to assess Arousal/Alertness: Awake/alert Orientation Level: Oriented to person;Oriented to place Focused Attention: Impaired Memory: Impaired Awareness: Impaired Problem Solving: Appears intact Organizing: Impaired Self Monitoring: Impaired Safety/Judgment: Impaired Sensation Sensation Hot/Cold: Appears Intact Proprioception: Appears Intact Stereognosis: Appears Intact Additional Comments: difficult to assess secondary  to global aphasia Coordination Gross Motor Movements are Fluid and Coordinated: Yes Fine Motor Movements are Fluid and  Coordinated: Yes Motor  Motor Motor: Abnormal postural alignment and control Motor - Discharge Observations: Limited due to generalized deconditioning, mild balance deficits improved since eval  Mobility Bed Mobility Bed Mobility: Sit to Supine;Supine to Sit;Rolling Right;Rolling Left Rolling Right: Independent Rolling Left: Independent Supine to Sit: Independent Sit to Supine: Independent Transfers Transfers: Sit to Stand;Stand to Sit;Stand Pivot Transfers Sit to Stand: Independent Stand to Sit: Independent Stand Pivot Transfers: Independent Transfer (Assistive device): None Locomotion  Gait Ambulation: Yes Gait Assistance: Independent Gait Distance (Feet): 250 Feet Assistive device: None Gait Gait: Yes Gait Pattern: Impaired Gait Pattern: Step-through pattern;Decreased hip/knee flexion - left;Decreased hip/knee flexion - right;Decreased stance time - right Gait velocity: decreased Stairs / Additional Locomotion Stairs: Yes Stair Management Technique: Two rails Ramp: Sales executive Mobility: No  Trunk/Postural Assessment  Cervical Assessment Cervical Assessment: Exceptions to [facility] Thoracic Assessment Thoracic Assessment: Exceptions to [facility] Lumbar Assessment Lumbar Assessment: Exceptions to [facility] Postural Control Postural Control: Within Functional Limits  Balance Balance Balance Assessed: Yes Static Sitting Balance Static Sitting - Balance Support: No upper extremity supported;Feet supported Static Sitting - Level of Assistance: 7: Independent Dynamic Sitting Balance Dynamic Sitting - Balance Support: During functional activity;No upper extremity supported;Feet supported Dynamic Sitting - Level of Assistance: 7: Independent Static Standing Balance Static Standing - Balance Support: No upper extremity supported;During  functional activity Static Standing - Level of Assistance: 7: Independent Dynamic Standing Balance Dynamic Standing - Balance Support: During functional activity;No upper extremity supported Dynamic Standing - Level of Assistance: 7: Independent Extremity Assessment      RLE Assessment RLE Assessment: Exceptions to [facility] General Strength Comments: difficulty following cues for formal MMT RLE Strength Right Hip Flexion: 4/5 Right Hip Extension: 4/5 Right Hip ABduction: 4/5 Right Hip ADduction: 4/5 Right Knee Flexion: 4/5 Right Knee Extension: 4/5 Right Ankle Dorsiflexion: 4/5 Right Ankle Plantar Flexion: 4/5 LLE Assessment LLE Assessment: Exceptions to [facility] General Strength Comments: difficulty following cues for formal MMT LLE Strength Left Hip Flexion: 4/5 Left Hip Extension: 4/5 Left Hip ABduction: 4/5 Left Hip ADduction: 4/5 Left Knee Flexion: 4/5 Left Knee Extension: 4/5 Left Ankle Dorsiflexion: 4/5 Left Ankle Plantar Flexion: 4/5    Trashawn Oquendo A Noemi Bellissimo 03/18/2021, 10:21 AM

## 2021-03-18 NOTE — Progress Notes (Signed)
Patient ID: Michelle Dean, female   DOB: [birth year], 75 y.o.   MRN: [id number] Follow up with the patient regarding pending discharge. Patient reported she is anxious to go but has some reservations about going. Noted son has been working for past 4 days straight to get ready to take her home. Will work 1/2 day tomorrow to get everything ready. Speech is much better than previously noted and spontaneous speech is also improved. Patient able to communicate needs and states an understanding of information given to manage secondary risk of stroke. Pamelia Hoit

## 2021-03-18 NOTE — Progress Notes (Signed)
Speech Language Pathology Discharge Summary  Patient Details  Name: Michelle Dean MRN: [id number] Date of Birth: [birth year]  Today's Date: 03/18/2021 SLP Individual Time: 1100-1130 SLP Individual Time Calculation (min): 30 min  Skilled Therapeutic Interventions: Pt received in recliner and agreeable to skilled ST intervention with focus on language goals. Upon arrival pt exhibited confusion regarding discharge date. She continuously stated she was leaving today. SLP facilitated communication repair with mod A verbal repetition and written cues on dry erase board. SLP also facilitated session by providing mod A verbal/visual cues for generative naming task with common categories. Pt benefited from written cues at word level to aid comprehension and acceptance of feedback. Patient was left in chair with alarm activated and immediate needs within reach at end of session. Continue per current plan of care.    Patient has met 5 of 5 long term goals.  Patient to discharge at overall Min;Mod level.  Reasons goals not met: NA   Clinical Impression/Discharge Summary:  Patient has made functional gains and has met 5 of 5 long-term goals this admission. Patient is currently an overall min-to-mod A for expressive language tasks, and mod A for receptive language skills. Pt continues to benefit from verbal (repetition), visual (gestures, pointing, articulatory placement cues) and written reinforcement at word level and phrase level to aid receptive language abilities. Patient and family education is complete and patient to discharge at overall min-to-mod A level. Patient's care partner is independent to provide the necessary physical and cognitive assistance at discharge. Patient would benefit from continued SLP services to maximize language and cognitive functioning and functional independence.    Care Partner:  Caregiver Able to Provide Assistance: Yes  Type of Caregiver Assistance:  Physical;Cognitive  Recommendation:  Home Health SLP;24 hour supervision/assistance  Rationale for SLP Follow Up: Maximize functional communication;Maximize cognitive function and independence   Equipment: None   Reasons for discharge: Treatment goals met;Discharged from hospital   Patient/Family Agrees with Progress Made and Goals Achieved: Yes    Naresh Althaus T Enyah Moman 03/18/2021, 12:51 PM

## 2021-03-18 NOTE — Progress Notes (Signed)
Occupational Therapy Session Note  Patient Details  Name: Michelle Dean MRN: [id number] Date of Birth: 1946/02/14  Today's Date: 03/18/2021 OT Individual Time: 0702-0759 OT Individual Time Calculation (min): 57 min    Short Term Goals: Week 2:  OT Short Term Goal 1 (Week 2): STG = LTG 2/2 ELOS  Patient met seated in recliner in agreement with OT treatment session. 0/10 pain reported at rest and with activity. Patient indicating need for toileting. Declined use of OT and RW for task. 3/3 parts of toileting task and hand hygiene standing at sink level with supervision A. Patient also completed oral hygiene and face washing standing at sink level with supervision A. Functional mobility to elevators. Patient required Max cues to sequence pressing "down" button and "4th floor" button. BITs system with focus on correctly naming numbers/letter and tapping in correct sequence. Max multimodal cues  needed to complete task correctly. Patient able to maintain static standing balance without external assist for >5 min x4 trials during task. Patient then completed trail making task on BITs with numbers only requiring 4:57 minutes. 7 errors made and Max question cues for correct sequencing. Patient quickly able to complete task from 1-10 but had increased difficulty with sequencing numbers from 11-25. Session concluded with patient seated in recliner with call bell within reach, belt alarm activated and all needs met.   Therapy Documentation Precautions:  Precautions Precautions: Fall Precaution Comments: receptive>expressive aphasia, 1L O2 via [place] if SpO2 <94%, TEDs daily Restrictions Weight Bearing Restrictions: No General:    Therapy/Group: Individual Therapy  Tahirah Sara R How[place]-Davis 03/18/2021, 6:52 AM

## 2021-03-18 NOTE — Progress Notes (Signed)
Patient ID: Michelle Dean, female   DOB: 25-Jan-1946, 75 y.o.   MRN: [id number]  Pt referral sent to [facility]

## 2021-03-18 NOTE — Progress Notes (Signed)
Inpatient Rehabilitation Discharge Medication Review by a Pharmacist  A complete drug regimen review was completed for this patient to identify any potential clinically significant medication issues.  High Risk Drug Classes Is patient taking? Indication by Medication  Antipsychotic No   Anticoagulant Yes Eliquis for Afib / CVA  Antibiotic No   Opioid No   Antiplatelet No   Hypoglycemics/insulin No   Vasoactive Medication Yes Amiodarone for Afib  Chemotherapy No   Other No      Type of Medication Issue Identified Description of Issue Recommendation(s)  Drug Interaction(s) (clinically significant)     Duplicate Therapy     Allergy     No Medication Administration End Date     Incorrect Dose     Additional Drug Therapy Needed     Significant med changes from prior encounter (inform family/care partners about these prior to discharge).    Other       Clinically significant medication issues were identified that warrant physician communication and completion of prescribed/recommended actions by midnight of the next day:  No  Pharmacist comments: None  Time spent performing this drug regimen review (minutes):  20 minutes   Elwin Sleight 03/18/2021 8:07 AM

## 2021-03-19 ENCOUNTER — Other Ambulatory Visit (HOSPITAL_COMMUNITY): Payer: Self-pay

## 2021-03-19 NOTE — Progress Notes (Signed)
Inpatient Rehabilitation Care Coordinator Discharge Note   Patient Details  Name: Michelle Dean MRN: [id number] Date of Birth: [birth year]   Discharge location: Home  Length of Stay: 12 Days  Discharge activity level: Supervison  Home/community participation: Son and daughter in Social worker providing 24/7 supervision  Patient response BB:CWUGQB Literacy - How often do you need to have someone help you when you read instructions, pamphlets, or other written material from your doctor or pharmacy?: Sometimes  Patient response VQ:XIHWTU Isolation - How often do you feel lonely or isolated from those around you?: Never  Services provided included: MD, RD, PT, OT, SLP, RN, CM, TR, Pharmacy, SW  Financial Services:  Field seismologist Utilized: Media planner [facility] Medicare  Choices offered to/list presented to: pt son  Follow-up services arranged:  Home Health [facility] Health Agency: [facility]         Patient response to transportation need: Is the patient able to respond to transportation needs?: Yes In the past 12 months, has lack of transportation kept you from medical appointments or from getting medications?: No In the past 12 months, has lack of transportation kept you from meetings, work, or from getting things needed for daily living?: No    Comments (or additional information):  Patient/Family verbalized understanding of follow-up arrangements:  Yes  Individual responsible for coordination of the follow-up plan: Dickie La [phone]  Confirmed correct DME delivered: Andria Rhein 03/19/2021    Andria Rhein

## 2021-03-19 NOTE — Progress Notes (Signed)
PROGRESS NOTE   Subjective/Complaints:  Aware of D/C very thankful to staff, still with receptive communication deficits when out of context   ROS: limited due to language/communication   Objective:   No results found. Recent Labs    03/17/21 0710  WBC 8.0  HGB 12.0  HCT 39.1  PLT 336    Recent Labs    03/17/21 0710  NA 138  K 4.6  CL 105  CO2 25  GLUCOSE 130*  BUN 14  CREATININE 1.10*  CALCIUM 9.0     Intake/Output Summary (Last 24 hours) at 03/19/2021 0801 Last data filed at 03/18/2021 1825 Gross per 24 hour  Intake 440 ml  Output --  Net 440 ml         Physical Exam: Vital Signs Blood pressure (!) 111/47, pulse 67, temperature 97.9 F (36.6 C), resp. rate 16, height 5\' 7"  (1.702 m), weight 90 kg, SpO2 97 %.   General: No acute distress Mood and affect are appropriate Heart: Regular rate and rhythm no rubs murmurs or extra sounds Lungs: Clear to auscultation, breathing unlabored, no rales or wheezes Abdomen: Positive bowel sounds, soft nontender to palpation, nondistended Extremities: No clubbing, cyanosis, or edema Skin: No evidence of breakdown, no evidence of rash Neurologic: Cranial nerves II through XII intact, motor strength is 5/5 in bilateral deltoid, bicep, tricep, grip, hip flexor, knee extensors, ankle dorsiflexor and plantar flexor  Musculoskeletal: Full range of motion in all 4 extremities. No joint swelling     Assessment/Plan: 1. Functional deficits Left MCA M2 infarct with apraxia, receptive aphasia reduced balance Stable for D/C today F/u PCP in 3-4 weeks F/u PM&R 2 weeks See D/C summary See D/C instructions   Care Tool:  Bathing    Body parts bathed by patient: Right arm, Left arm, Chest, Abdomen, Front perineal area, Right upper leg, Buttocks, Left upper leg, Right lower leg, Face, Left lower leg         Bathing assist Assist Level: Supervision/Verbal cueing      Upper Body Dressing/Undressing Upper body dressing   What is the patient wearing?: Hospital gown only    Upper body assist Assist Level: Independent    Lower Body Dressing/Undressing Lower body dressing      What is the patient wearing?: Hospital gown only     Lower body assist Assist for lower body dressing: Supervision/Verbal cueing     Toileting Toileting    Toileting assist Assist for toileting: Supervision/Verbal cueing     Transfers Chair/bed transfer  Transfers assist     Chair/bed transfer assist level: Independent Chair/bed transfer assistive device: Museum/gallery exhibitions officer assist      Assist level: Independent Assistive device: No Device Max distance: 250   Walk 10 feet activity   Assist     Assist level: Independent Assistive device: No Device   Walk 50 feet activity   Assist    Assist level: Independent Assistive device: No Device    Walk 150 feet activity   Assist Walk 150 feet activity did not occur: Safety/medical concerns  Assist level: Independent Assistive device: No Device    Walk 10 feet on  uneven surface  activity   Assist Walk 10 feet on uneven surfaces activity did not occur: Safety/medical concerns   Assist level: Independent     Wheelchair     Assist Is the patient using a wheelchair?: No Type of Wheelchair: Manual    Wheelchair assist level: Total Assistance - Patient < 25% Max wheelchair distance: 300 ft    Wheelchair 50 feet with 2 turns activity    Assist        Assist Level: Total Assistance - Patient < 25%   Wheelchair 150 feet activity     Assist      Assist Level: Total Assistance - Patient < 25%   Blood pressure (!) 111/47, pulse 67, temperature 97.9 F (36.6 C), resp. rate 16, height 5\' 7"  (1.702 m), weight 90 kg, SpO2 97 %.  Medical Problem List and Plan: 1.   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  Antithrombotics: -DVT/anticoagulation:  Pharmaceutical: Other (comment) Eliquis             -antiplatelet therapy: N/A 3. Pain Management: Tylenol as needed 4. Mood: Provide emotional support             -antipsychotic agents: N/A 5. Neuropsych: This patient is not capable of making decisions on her own behalf. 6. Skin/Wound Care: Routine skin checks 7. Fluids/Electrolytes/Nutrition: I personally reviewed the patient's labs today.  11/7 8.  Atrial fibrillation with RVR.  Status post cardioversion.  Follow-up cardiology services.  Amiodarone 200 mg  daily, verapamil 40 mg every 8 hours.  -HR controlled 9.  Hypotension.  Currently on ProAmatine5mg  3 times daily.hope to wean as BP allows  Vitals:   03/18/21 1946 03/19/21 0356  BP:  (!) 111/47  Pulse:  67  Resp:  16  Temp:  97.9 F (36.6 C)  SpO2: 97% 97%    -HR/BP controlled- off proamatine and Calan , on amiodarone  10.  Hyperlipidemia.  Lipitor 11.  History of COPD.  Monitor oxygen saturations.  Continue nebulizers  10/30- on home O2 regularly- con't reegimen 12.  Leukocytosis.  Chest x-ray negative.  Completed course of doxycycline  13.  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  Hx GERD restart PPI as pt with intermittent sx 15.  Constipation should improve off verapamil , will start senna s bid, dulc supp today   -moved bowels 11/6   LOS: 12 days A FACE TO FACE EVALUATION WAS PERFORMED  13/6 03/19/2021, 8:01 AM

## 2021-03-19 NOTE — Progress Notes (Signed)
Patient ID: Michelle Dean, female   DOB: 1945/08/28, 75 y.o.   MRN: [id number]  Patient approved by [facility].

## 2021-03-20 ENCOUNTER — Telehealth: Payer: Self-pay

## 2021-03-20 NOTE — Telephone Encounter (Signed)
Transitional Care Call--who you spoke with Michelle Dean (Son ph#  [phone]).   Are you/is patient experiencing any problems since coming home? None. Are there any questions regarding any aspect of care? No. Are there any questions regarding medications administration/dosing? When should Spiriva be taken. Son advised per discharge notes to give daily. Are meds being taken as prescribed? Yes. Patient should review meds with caller to confirm. Done. Have there been any falls? No falls.  Has Home Health been to the house and/or have they contacted you?Yes. A visit is scheduled for  today between 12 & 1 pm. If not, have you tried to contact them? Advised if they do not come to call back.  Can we help you contact them? N/A Are bowels and bladder emptying properly? Yes.  Are there any unexpected incontinence issues? None. If applicable, is patient following bowel/bladder programs? No problem.  Any fevers, problems with breathing, unexpected pain? No,  Are there any skin problems or new areas of breakdown? None. Has the patient/family member arranged specialty MD follow up (ie cardiology/neurology/renal/surgical/etc)? Appointments scheduled and confirm with Son. Can we help arrange? N/A Does the patient need any other services or support that we can help arrange? No. Are caregivers following through as expected in assisting the patient? Yes.        11. Has the patient quit smoking, drinking alcohol, or using drugs as recommended? Patient does not take part in such activities.   Appointment Date/Time/ Arrival time/ and who they are seeing Appointment on 04/19/2021 at PM&R with Riley Lam NP [address]

## 2021-04-01 ENCOUNTER — Telehealth (HOSPITAL_COMMUNITY): Payer: Self-pay | Admitting: Pharmacist

## 2021-04-01 NOTE — Telephone Encounter (Signed)
LVM

## 2021-04-02 ENCOUNTER — Other Ambulatory Visit (HOSPITAL_COMMUNITY): Payer: Self-pay

## 2021-04-02 ENCOUNTER — Telehealth (HOSPITAL_COMMUNITY): Payer: Self-pay | Admitting: Pharmacist

## 2021-04-02 NOTE — Telephone Encounter (Signed)
Pharmacy Transitions of Care Follow-up Telephone Call  Date of discharge: 03/19/2021   How have you been since you were released from the hospital? Per son, good    Medication Accessibility:  Home Pharmacy: [facility]   Was the patient provided with refills on discharged medications? Yes   Have all prescriptions been transferred from [facility] to home pharmacy? Yes   Medication Review:  APIXABAN (ELIQUIS)  Apixaban 10 mg BID initiated on . Will switch to apixaban 5 mg BID after 7 days (DATE).  - Discussed importance of taking medication around the same time everyday  - Reviewed potential DDIs with patient  - Advised patient of medications to avoid (NSAIDs, ASA)  - Educated that Tylenol (acetaminophen) will be the preferred analgesic to prevent risk of bleeding  - Emphasized importance of monitoring for signs and symptoms of bleeding (abnormal bruising, prolonged bleeding, nose bleeds, bleeding from gums, discolored urine, black tarry stools)  - Advised patient to alert all providers of anticoagulation therapy prior to starting a new medication or having a procedure   If their condition worsens, is the pt aware to call PCP or go to the Emergency Dept.? Yes  Final Patient Assessment: Per son/caregiver, patient doing well.

## 2021-04-10 ENCOUNTER — Emergency Department (HOSPITAL_COMMUNITY): Payer: Medicare Other

## 2021-04-10 ENCOUNTER — Emergency Department (HOSPITAL_COMMUNITY)
Admission: EM | Admit: 2021-04-10 | Discharge: 2021-04-10 | Disposition: A | Discharge status: Home / Self Care | Payer: Medicare Other | Attending: Emergency Medicine | Admitting: Emergency Medicine

## 2021-04-10 ENCOUNTER — Other Ambulatory Visit: Payer: Self-pay

## 2021-04-10 DIAGNOSIS — I480 Paroxysmal atrial fibrillation: Secondary | ICD-10-CM | POA: Insufficient documentation

## 2021-04-10 DIAGNOSIS — R0600 Dyspnea, unspecified: Secondary | ICD-10-CM

## 2021-04-10 DIAGNOSIS — Z79899 Other long term (current) drug therapy: Secondary | ICD-10-CM | POA: Diagnosis not present

## 2021-04-10 DIAGNOSIS — Z20822 Contact with and (suspected) exposure to covid-19: Secondary | ICD-10-CM | POA: Diagnosis not present

## 2021-04-10 DIAGNOSIS — M7989 Other specified soft tissue disorders: Secondary | ICD-10-CM | POA: Diagnosis not present

## 2021-04-10 DIAGNOSIS — R0602 Shortness of breath: Secondary | ICD-10-CM | POA: Insufficient documentation

## 2021-04-10 DIAGNOSIS — J441 Chronic obstructive pulmonary disease with (acute) exacerbation: Secondary | ICD-10-CM | POA: Diagnosis not present

## 2021-04-10 DIAGNOSIS — Z7901 Long term (current) use of anticoagulants: Secondary | ICD-10-CM | POA: Insufficient documentation

## 2021-04-10 DIAGNOSIS — I10 Essential (primary) hypertension: Secondary | ICD-10-CM | POA: Diagnosis not present

## 2021-04-10 DIAGNOSIS — R059 Cough, unspecified: Secondary | ICD-10-CM | POA: Diagnosis not present

## 2021-04-10 LAB — BASIC METABOLIC PANEL
Anion gap: 7 (ref 5–15)
BUN: 7 mg/dL — ABNORMAL LOW (ref 8–23)
CO2: 29 mmol/L (ref 22–32)
Calcium: 8.6 mg/dL — ABNORMAL LOW (ref 8.9–10.3)
Chloride: 104 mmol/L (ref 98–111)
Creatinine, Ser: 0.69 mg/dL (ref 0.44–1.00)
GFR, Estimated: >60 mL/min (ref >60)
Glucose, Bld: 118 mg/dL — ABNORMAL HIGH (ref 70–99)
Potassium: 3.4 mmol/L — ABNORMAL LOW (ref 3.5–5.1)
Sodium: 140 mmol/L (ref 135–145)

## 2021-04-10 LAB — CBC WITH DIFFERENTIAL/PLATELET
Abs Immature Granulocytes: 0.05 10*3/uL (ref 0.00–0.07)
Basophils Absolute: 0.1 10*3/uL (ref 0.0–0.1)
Basophils Relative: 0 %
Eosinophils Absolute: 0.1 10*3/uL (ref 0.0–0.5)
Eosinophils Relative: 1 %
HCT: 36.3 % (ref 36.0–46.0)
Hemoglobin: 11.3 g/dL — ABNORMAL LOW (ref 12.0–15.0)
Immature Granulocytes: 0 %
Lymphocytes Relative: 13 %
Lymphs Abs: 1.7 10*3/uL (ref 0.7–4.0)
MCH: 29.7 pg (ref 26.0–34.0)
MCHC: 31.1 g/dL (ref 30.0–36.0)
MCV: 95.5 fL (ref 80.0–100.0)
Monocytes Absolute: 2 10*3/uL — ABNORMAL HIGH (ref 0.1–1.0)
Monocytes Relative: 15 %
Neutro Abs: 9.5 10*3/uL — ABNORMAL HIGH (ref 1.7–7.7)
Neutrophils Relative %: 71 %
Platelets: 381 10*3/uL (ref 150–400)
RBC: 3.8 MIL/uL — ABNORMAL LOW (ref 3.87–5.11)
RDW: 14.3 % (ref 11.5–15.5)
WBC: 13.3 10*3/uL — ABNORMAL HIGH (ref 4.0–10.5)
nRBC: 0 % (ref 0.0–0.2)

## 2021-04-10 LAB — RESP PANEL BY RT-PCR (FLU A&B, COVID) ARPGX2
Influenza A by PCR: NEGATIVE
Influenza B by PCR: NEGATIVE
SARS Coronavirus 2 by RT PCR: NEGATIVE

## 2021-04-10 LAB — BRAIN NATRIURETIC PEPTIDE: B Natriuretic Peptide: 174.2 pg/mL — ABNORMAL HIGH (ref 0.0–100.0)

## 2021-04-10 MED ORDER — IOHEXOL 350 MG/ML SOLN
125.0000 mL | Freq: Once | INTRAVENOUS | Status: AC | PRN
  Administered 2021-04-10: 125 mL via INTRAVENOUS

## 2021-04-10 MED ORDER — CEFDINIR 300 MG PO CAPS: 300.0000 mg | ORAL_CAPSULE | Freq: Two times a day (BID) | ORAL | 0 refills | Status: AC

## 2021-04-10 MED ORDER — IPRATROPIUM-ALBUTEROL 0.5-2.5 (3) MG/3ML IN SOLN
3.0000 mL | Freq: Once | RESPIRATORY_TRACT | Status: AC
  Administered 2021-04-10: 3 mL via RESPIRATORY_TRACT
  Filled 2021-04-10: qty 3

## 2021-04-10 MED ORDER — METHYLPREDNISOLONE SODIUM SUCC 125 MG IJ SOLR
125.0000 mg | Freq: Once | INTRAMUSCULAR | Status: AC
  Administered 2021-04-10: 125 mg via INTRAVENOUS
  Filled 2021-04-10: qty 2

## 2021-04-10 MED ORDER — PREDNISONE 20 MG PO TABS
40.0000 mg | ORAL_TABLET | Freq: Every day | ORAL | 0 refills | Status: AC
Start: 2021-04-10 — End: 2021-04-15

## 2021-04-10 MED ORDER — ALBUTEROL SULFATE HFA 108 (90 BASE) MCG/ACT IN AERS: 2.0000 | INHALATION_SPRAY | RESPIRATORY_TRACT | Status: DC | PRN

## 2021-04-10 NOTE — ED Provider Notes (Signed)
[facility] Provider Triage Evaluation Note  Michelle Dean , a 75 y.o. female  was evaluated in triage.  Pt complains of shortness of breath for the past 2 to 3 days.  Patient has a history of COPD.  Patient reports her cough is productive when she is coughing up sputum.  Denies any fevers.  Reports some nasal congestion and rhinorrhea.  Denies any chest pain.  Reports dyspnea on exertion.  Denies any leg swelling.  Patient was hospitalized last month for stroke.  Family member at bedside says she is baseline..  Review of Systems  Positive: SOB, DOE Negative: Chest pain, weakness, fevers  Physical Exam  BP 136/76   Pulse 76   Temp 98.8 F (37.1 C) (Oral)   Resp 16   SpO2 91%  Gen:   Awake, no distress   Resp:  Normal effort, speaking in full sentences MSK:   Moves extremities without difficulty  Other:  Lung sounds diminished  Medical Decision Making  Medically screening exam initiated at 10:24 AM.  Appropriate orders placed.  Elaijah Munoz was informed that the remainder of the evaluation will be completed by another provider, this initial triage assessment does not replace that evaluation, and the importance of remaining in the ED until their evaluation is complete.  Labs and imaging placed   Achille Rich, [place] 04/10/21 1025    Blane Ohara, MD 04/11/21 1001

## 2021-04-10 NOTE — ED Notes (Signed)
Trialed pt on room air lying in bed and pt dropped sats to 86% but never felt SOB.  Will ambulate on 02 for this reason.

## 2021-04-10 NOTE — ED Provider Notes (Signed)
I saw and evaluated the patient, reviewed the resident's note and I agree with the findings and plan.  EKG Interpretation  Date/Time:  Wednesday April 10 2021 10:12:42 EST Ventricular Rate:  78 PR Interval:  146 QRS Duration: 114 QT Interval:  370 QTC Calculation: 421 R Axis:   -37 Text Interpretation: Normal sinus rhythm with sinus arrhythmia Left axis deviation Low voltage QRS Right bundle branch block Abnormal ECG No significant change since last tracing Confirmed by Lorre Nick ([id number]) on 04/10/2021 4:51:17 PM   75 year old female presents with increasing dyspnea x2 weeks.  Does have a history of COPD.  Chest x-ray has been concerning findings which require CT.  Will treat with bronchodilators and steroids and reassess.  Patient may require admission   Lorre Nick, MD 04/10/21 1700

## 2021-04-10 NOTE — ED Notes (Signed)
DC instructions reviewed with pt. PT verbalized understanding. PT DC °

## 2021-04-10 NOTE — Discharge Instructions (Signed)
You were seen for shortness of breath. We are treating your for COPD exacerbation.

## 2021-04-10 NOTE — ED Triage Notes (Signed)
BIB POV after pt reports increase SHOB x 2 days, worse with exertion. PMH: COPD, TIA

## 2021-04-10 NOTE — ED Provider Notes (Signed)
[facility] EMERGENCY DEPARTMENT Provider Note   CSN: [id number] Arrival date & time: 04/10/21  0935     History Chief Complaint  Patient presents with   Shortness of Breath    Michelle Dean is a 75 y.o. female w/ PMHx of COPD, CVA, aphasia, Afib, HFpEF, and HTN who presents for 2 days of worsening shortness of breath and cough. Her son is at bedside and contributes in most of the HPI given the patient's comprehensive aphasia. He states she has been very mobile until two days ago when she started to have cough and shortness of breath. She has a history of prior illness of emesis and diarrhea and has known sick contacts of family member with respiratory issues. Her son states today she was given Incruse which seemed to improve her shortness of breath. She states at rest she feels she is working hard to breath. She also endorses lower extremity swelling but has improved from her past. Her cough is with sputum production and she has chest discomfort from excessive coughing. She also had associated decreased appetite. She has not eaten anything to day but candy. She has home O2 2.5 L which she uses intermittently and has not required until now.   The history is provided by the patient and a relative. The history is limited by the condition of the patient.  Shortness of Breath Severity:  Moderate Onset quality:  Sudden Duration:  2 days Timing:  Constant Progression:  Improving Chronicity:  New Context: activity and URI   Context: not smoke exposure   Relieved by:  Inhaler Worsened by:  Exertion, movement, activity and coughing Associated symptoms: cough and sputum production   Associated symptoms: no abdominal pain, no chest pain, no claudication, no fever, no headaches, no sore throat, no vomiting and no wheezing   Risk factors: obesity       No past medical history on file.  Patient Active Problem List   Diagnosis Date Noted   Left middle cerebral artery stroke  (HCC) 03/07/2021   Paroxysmal atrial fibrillation (HCC)    Long term (current) use of anticoagulants    Long term current use of antiarrhythmic drug    Acute heart failure with preserved ejection fraction (HFpEF) (HCC)    Hypotension, iatrogenic    Aphasia    Atrial fibrillation with rapid ventricular response (HCC)    Atrial flutter with rapid ventricular response (HCC)    Benign hypertension    Mixed hyperlipidemia    Acute ischemic stroke (HCC) 02/25/2021   Stroke (cerebrum) (HCC) 02/25/2021   Middle cerebral artery embolism, 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    Past Surgical History:  Procedure Laterality Date   BUBBLE STUDY  03/01/2021   Procedure: BUBBLE STUDY;  Surgeon: Tessa Lerner, DO;  Location: [facility] ENDOSCOPY;  Service: Cardiovascular;;   CARDIOVERSION N/A 03/01/2021   Procedure: CARDIOVERSION;  Surgeon: Tessa Lerner, DO;  Location: [facility] ENDOSCOPY;  Service: Cardiovascular;  Laterality: N/A;   CARDIOVERSION N/A 03/05/2021   Procedure: CARDIOVERSION;  Surgeon: Yates Decamp, MD;  Location: [facility] ENDOSCOPY;  Service: Cardiovascular;  Laterality: N/A;   IR CT HEAD LTD  02/25/2021   IR PERCUTANEOUS ART THROMBECTOMY/INFUSION INTRACRANIAL INC DIAG ANGIO  02/25/2021   RADIOLOGY WITH ANESTHESIA N/A 02/25/2021   Procedure: IR WITH ANESTHESIA;  Surgeon: Radiologist, Medication, MD;  Location: [facility] OR;  Service: Radiology;  Laterality: N/A;   TEE WITHOUT CARDIOVERSION N/A 03/01/2021   Procedure: TRANSESOPHAGEAL ECHOCARDIOGRAM (TEE);  Surgeon: Tessa Lerner, DO;  Location: [facility] ENDOSCOPY;  Service:  Cardiovascular;  Laterality: N/A;     OB History   No obstetric history on file.     No family history on file.  Social History   Tobacco Use   Smoking status: Never    Passive exposure: Never   Smokeless tobacco: Never  Vaping Use   Vaping Use: Unknown  Substance Use Topics   Alcohol use: Never   Drug use: Never    Home Medications Prior to Admission medications   Medication Sig Start Date End Date  Taking? Authorizing Provider  acetaminophen (TYLENOL) 325 MG tablet Take 2 tablets (650 mg total) by mouth every 4 (four) hours as needed for mild pain (or temp > 37.5 C (99.5 F)). 03/07/21   de Saintclair Halsted, Cortney E, NP  amiodarone (PACERONE) 200 MG tablet Take 1 tablet (200 mg total) by mouth daily. 03/18/21   Angiulli, Mcarthur Rossetti, PA-C  apixaban (ELIQUIS) 5 MG TABS tablet Take 1 tablet (5 mg total) by mouth 2 (two) times daily. 03/18/21   Angiulli, Mcarthur Rossetti, PA-C  atorvastatin (LIPITOR) 10 MG tablet Take 1 tablet (10 mg total) by mouth daily. 03/18/21   Angiulli, Mcarthur Rossetti, PA-C  carboxymethylcellulose (REFRESH PLUS) 0.5 % SOLN Place 1 drop into both eyes 4 (four) times daily as needed (dry eyes).    [provider]  magnesium oxide (MAG-OX) 400 MG tablet Take 1 tablet (400 mg total) by mouth daily. 03/18/21   Angiulli, Mcarthur Rossetti, PA-C  mometasone-formoterol (DULERA) 100-5 MCG/ACT AERO Inhale 2 puffs into the lungs 2 (two) times daily. 03/18/21   Angiulli, Mcarthur Rossetti, PA-C  Multiple Vitamins-Minerals (CERTAVITE/ANTIOXIDANTS) TABS Take 1 tablet by mouth daily. 03/08/21   de Saintclair Halsted, Cortney E, NP  omeprazole (PRILOSEC) 20 MG capsule Take 1 capsule (20 mg total) by mouth daily. 03/18/21   Angiulli, Mcarthur Rossetti, PA-C  senna-docusate (SENOKOT-S) 8.6-50 MG tablet Take 2 tablets by mouth 2 (two) times daily. 03/18/21   Angiulli, Mcarthur Rossetti, PA-C  tiotropium (SPIRIVA HANDIHALER) 18 MCG inhalation capsule Place 1 capsule (18 mcg total) into inhaler and inhale daily. 03/18/21   Angiulli, Mcarthur Rossetti, PA-C    Allergies    Aspirin, Penicillins, and Lisinopril  Review of Systems   Review of Systems  Constitutional:  Negative for fever.  HENT:  Negative for congestion and sore throat.   Eyes:  Negative for visual disturbance.  Respiratory:  Positive for cough, sputum production and shortness of breath. Negative for wheezing.   Cardiovascular:  Negative for chest pain and claudication.  Gastrointestinal:  Negative  for abdominal pain, nausea and vomiting.  Genitourinary:  Negative for difficulty urinating.  Musculoskeletal:  Negative for gait problem.  Neurological:  Positive for speech difficulty. Negative for headaches.  Psychiatric/Behavioral:  Negative for confusion.    Physical Exam Updated Vital Signs BP 136/75   Pulse 81   Temp 98.8 F (37.1 C) (Oral)   Resp 18   SpO2 96%   Physical Exam Vitals reviewed.  Constitutional:      General: She is in acute distress.     Appearance: She is obese. She is not ill-appearing or toxic-appearing.  HENT:     Head: Normocephalic.     Mouth/Throat:     Pharynx: No pharyngeal swelling or oropharyngeal exudate.  Eyes:     Extraocular Movements: Extraocular movements intact.     Pupils: Pupils are equal, round, and reactive to light.  Cardiovascular:     Rate and Rhythm: Normal rate and regular rhythm.  Pulmonary:  Effort: Tachypnea and respiratory distress present.     Breath sounds: Examination of the right-upper field reveals rales. Examination of the left-upper field reveals rales. Examination of the right-lower field reveals rales. Examination of the left-lower field reveals rales. Rales present. No wheezing or rhonchi.  Abdominal:     General: Bowel sounds are normal.     Palpations: Abdomen is soft.     Tenderness: There is no abdominal tenderness. There is no guarding.  Musculoskeletal:     Right lower leg: No tenderness. Edema present.     Left lower leg: No tenderness. Edema present.  Neurological:     Mental Status: She is alert and oriented to person, place, and time.  Psychiatric:        Mood and Affect: Mood normal.        Behavior: Behavior normal.    ED Results / Procedures / Treatments   Labs (all labs ordered are listed, but only abnormal results are displayed) Labs Reviewed  BASIC METABOLIC PANEL - Abnormal; Notable for the following components:      Result Value   Potassium 3.4 (*)    Glucose, Bld 118 (*)    BUN  7 (*)    Calcium 8.6 (*)    All other components within normal limits  CBC WITH DIFFERENTIAL/PLATELET - Abnormal; Notable for the following components:   WBC 13.3 (*)    RBC 3.80 (*)    Hemoglobin 11.3 (*)    Neutro Abs 9.5 (*)    Monocytes Absolute 2.0 (*)    All other components within normal limits  BRAIN NATRIURETIC PEPTIDE - Abnormal; Notable for the following components:   B Natriuretic Peptide 174.2 (*)    All other components within normal limits  RESP PANEL BY RT-PCR (FLU A&B, COVID) [id number]    EKG None  Radiology DG Chest 2 View  Result Date: 04/10/2021 CLINICAL DATA:  Shortness of breath EXAM: CHEST - 2 VIEW COMPARISON:  Chest x-ray dated March 02, 2021 FINDINGS: Cardiac and mediastinal contours are within normal limits. Blunting of the left costophrenic angle. Indeterminate nodular opacity of the left upper lung. Mild bibasilar opacities. No evidence of pneumothorax. IMPRESSION: 1.   Mild bibasilar opacities, likely due to atelectasis. 2. Blunting of the left costophrenic angle, possibly due to trace pleural effusion versus pleural thickening. 3. Indeterminate nodular opacity of the left upper lung. Recommend further evaluation with chest CT, which can be performed non emergently. Electronically Signed   By: Allegra Lai M.D.   On: 04/10/2021 10:53    Procedures Procedures   Medications Ordered in ED Medications  albuterol (VENTOLIN HFA) 108 (90 Base) MCG/ACT inhaler 2 puff (has no administration in time range)    ED Course  I have reviewed the triage vital signs and the nursing notes.  Pertinent labs & imaging results that were available during my care of the patient were reviewed by me and considered in my medical decision making (see chart for details).    MDM Rules/Calculators/A&P                           75 yo F w/ PMHx of COPD, CVA, aphasia, Afib, HFpEF, and HTN who presents for 2 days of worsening shortness of breath and cough. Known sick contacts  over the thanksgiving holiday. Respiratory panel negative.   She appears to have increase work of breathing at baseline. On 2L of O2 satting at 96%. She is afebrile.  Lung exam is remarkable for bibasilar crackles and diffuse rales. She has non-pitting BLE edema. WBC 13.3. BNP 174.2. CXR w/ possible pleural effusion and unknown LUL opacity.   Ddx includes PE, PNA, COPD exacerbation, viral URI.   Plan to obatin CTA chest, administer duonebs, and steroids.   CT w/o PE or pneumonia but evidence of multiple pulmonary nodules the worst being 10 mm.  Upon reassessment patient feeling better post breathing treatment. Dicussed findings with patient and son and need to follow up with PCP on lung findings and continued surveillance. Patient would like to go home.   Ambulated with pulse ox on 2L O2 no desaturation. Saturation maintained 98-100%.   Will treat for COPD exacerbation and encourage follow up with PCP.   Final Clinical Impression(s) / ED Diagnoses Final diagnoses:  Dyspnea, unspecified type    Rx / DC Orders ED Discharge Orders          Ordered    predniSONE (DELTASONE) 20 MG tablet  Daily with breakfast        04/10/21 2035    cefdinir (OMNICEF) 300 MG capsule  2 times daily        04/10/21 2035             Autry-Lott, Randa Evens, DO 04/10/21 2105    Lorre Nick, MD 04/11/21 [phone]

## 2021-04-10 NOTE — ED Notes (Signed)
Pt ambulated around room on 2L and maintained 02 at 98-100%

## 2021-04-11 ENCOUNTER — Ambulatory Visit: Payer: Medicare Other | Admitting: Cardiology

## 2021-04-19 ENCOUNTER — Encounter: Payer: Medicare Other | Attending: Registered Nurse | Admitting: Registered Nurse

## 2021-04-29 ENCOUNTER — Encounter: Payer: Self-pay | Admitting: Adult Health

## 2021-04-29 ENCOUNTER — Ambulatory Visit: Payer: Medicare Other | Admitting: Adult Health

## 2021-04-29 VITALS — BP 158/77 | HR 70 | Ht 62.0 in | Wt 188.0 lb

## 2021-04-29 DIAGNOSIS — I480 Paroxysmal atrial fibrillation: Secondary | ICD-10-CM

## 2021-04-29 DIAGNOSIS — I63412 Cerebral infarction due to embolism of left middle cerebral artery: Secondary | ICD-10-CM | POA: Diagnosis not present

## 2021-04-29 DIAGNOSIS — I10 Essential (primary) hypertension: Secondary | ICD-10-CM

## 2021-04-29 DIAGNOSIS — E785 Hyperlipidemia, unspecified: Secondary | ICD-10-CM | POA: Diagnosis not present

## 2021-04-29 NOTE — Patient Instructions (Addendum)
Continue Eliquis and atorvastatin 10mg  daily for secondary stroke prevention measures   Continue working with speech therapy for hopeful ongoing recovery - may consider working without additional outpatient speech therapy is needed once home health completed    Follow up in 3 months or call earlier if needed     Stroke Prevention Some medical conditions and lifestyle choices can lead to a higher risk for a stroke. You can help to prevent a stroke by eating healthy foods and exercising. It also helps to not smoke and to manage any health problems you may have. How can this condition affect me? A stroke is an emergency. It should be treated right away. A stroke can lead to brain damage or threaten your life. There is a better chance of surviving and getting better after a stroke if you get medical help right away. What can increase my risk? The following medical conditions may increase your risk of a stroke: Diseases of the heart and blood vessels (cardiovascular disease). High blood pressure (hypertension). Diabetes. High cholesterol. Sickle cell disease. Problems with blood clotting. Being very overweight. Sleeping problems (obstructivesleep apnea). Other risk factors include: Being older than age 53. A history of blood clots, stroke, or mini-stroke (TIA). Race, ethnic background, or a family history of stroke. Smoking or using tobacco products. Taking birth control pills, especially if you smoke. Heavy alcohol and drug use. Not being active. What actions can I take to prevent this? Manage your health conditions High cholesterol. Eat a healthy diet. If this is not enough to manage your cholesterol, you may need to take medicines. Take medicines as told by your doctor. High blood pressure. Try to keep your blood pressure below 130/80. If your blood pressure cannot be managed through a healthy diet and regular exercise, you may need to take medicines. Take medicines as  told by your doctor. Ask your doctor if you should check your blood pressure at home. Have your blood pressure checked every year. Diabetes. Eat a healthy diet and get regular exercise. If your blood sugar (glucose) cannot be managed through diet and exercise, you may need to take medicines. Take medicines as told by your doctor. Talk to your doctor about getting checked for sleeping problems. Signs of a problem can include: Snoring a lot. Feeling very tired. Make sure that you manage any other conditions you have. Nutrition  Follow instructions from your doctor about what to eat or drink. You may be told to: Eat and drink fewer calories each day. Limit how much salt (sodium) you use to 1,500 milligrams (mg) each day. Use only healthy fats for cooking, such as olive oil, canola oil, and sunflower oil. Eat healthy foods. To do this: Choose foods that are high in fiber. These include whole grains, and fresh fruits and vegetables. Eat at least 5 servings of fruits and vegetables a day. Try to fill one-half of your plate with fruits and vegetables at each meal. Choose low-fat (lean) proteins. These include low-fat cuts of meat, chicken without skin, fish, tofu, beans, and nuts. Eat low-fat dairy products. Avoid foods that: Are high in salt. Have saturated fat. Have trans fat. Have cholesterol. Are processed or pre-made. Count how many carbohydrates you eat and drink each day. Lifestyle If you drink alcohol: Limit how much you have to: 0-1 drink a day for women who are not pregnant. 0-2 drinks a day for men. Know how much alcohol is in your drink. In the [place] one drink equals  one 12 oz bottle of beer (374mL), one 5 oz glass of wine (166mL), or one 1 oz glass of hard liquor (44mL). Do not smoke or use any products that have nicotine or tobacco. If you need help quitting, ask your doctor. Avoid secondhand smoke. Do not use drugs. Activity  Try to stay at a healthy weight. Get at  least 30 minutes of exercise on most days, such as: Fast walking. Biking. Swimming. Medicines Take over-the-counter and prescription medicines only as told by your doctor. Avoid taking birth control pills. Talk to your doctor about the risks of taking birth control pills if: You are over 60 years old. You smoke. You get very bad headaches. You have had a blood clot. Where to find more information American Stroke Association: www.strokeassociation.org Get help right away if: You or a loved one has any signs of a stroke. "BE FAST" is an easy way to remember the warning signs: B - Balance. Dizziness, sudden trouble walking, or loss of balance. E - Eyes. Trouble seeing or a change in how you see. F - Face. Sudden weakness or loss of feeling of the face. The face or eyelid may droop on one side. A - Arms. Weakness or loss of feeling in an arm. This happens all of a sudden and most often on one side of the body. S - Speech. Sudden trouble speaking, slurred speech, or trouble understanding what people say. T - Time. Time to call emergency services. Write down what time symptoms started. You or a loved one has other signs of a stroke, such as: A sudden, very bad headache with no known cause. Feeling like you may vomit (nausea). Vomiting. A seizure. These symptoms may be an emergency. Get help right away. Call your local emergency services (911 in the [place] Do not wait to see if the symptoms will go away. Do not drive yourself to the hospital. Summary You can help to prevent a stroke by eating healthy, exercising, and not smoking. It also helps to manage any health problems you have. Do not smoke or use any products that contain nicotine or tobacco. Get help right away if you or a loved one has any signs of a stroke. This information is not intended to replace advice given to you by your health care provider. Make sure you discuss any questions you have with your health care  provider. Document Revised: 11/28/2019 Document Reviewed: 11/28/2019 Elsevier Patient Education  [employer].

## 2021-04-29 NOTE — Progress Notes (Signed)
[facility] [address] [place]. [place] [address] [phone]       HOSPITAL FOLLOW UP NOTE  [name] Date of Birth:  08-07-1945 Medical Record Number:  [id number]   Reason for Referral:  hospital stroke follow up    SUBJECTIVE:   CHIEF COMPLAINT:  Chief Complaint  Patient presents with   Follow-up    RM 2 with son Toby Pt is well and stable, son states she is still in Speech therapy. Having some trouble speaking and understanding.      HPI:   Michelle Dean is a 75 y.o. female with a PMHx of COPD, HTN and HLD who presented to the [facility] on 02/25/2021 for evaluation of acute onset confusion. Eval by teleneurology - per note, exam more consistent with aphasia. CTH negative and TNK administered. CTA left M2/M3 occlusion and transferred to [facility] for angiogram and possible thrombectomy.  Personally reviewed hospitalization pertinent progress notes, lab work and imaging.  Proceeded to IR once arrived to [facility] with TICI 2C flow and reocclusion. MR brain showed left MCA with petechial hemorrhage.  EF 55 to 60%.  LE Doppler negative.  LDL 63 on atorvastatin 10 mg daily.  A1c 5.5.  Found to be in atrial fibrillation with unsuccessful cardioversion 10/21 and second attempt 10/25 after amiodarone load which was successful. New dx of A fib -Eliquis started 10/23.  TEE no evidence of thrombus or vegetation. Hx of HTN with hypotension during admission requiring midodrine 10 mg TID.  No prior stroke history.  Treated with 10-day course of doxycycline for cough with sputum production and leukocytosis.  Therapy eval's recommended CIR for ongoing therapy needs.    Today, 04/29/2021, patient being seen for hospital follow up accompanied by her son.   Speech gradually improving - per son, still having issues with word finding, understanding what is being said,  reading comprehension and writing. Living by herself but son checks on her frequently -she is maintaining all ADLs  independently and some IADLs such as cooking, cleaning, and medications. Son paying bills, provides transportation and grocery shopping. She questions return to driving.  Denies cognitive issues.  Denies any weakness or swallowing difficulties. She does become tearful during visit due to loss of independence and inability to drive.  Continues to work with [facility] SLP. Cleared by PT/OT.  Denies new stroke/TIA symptoms.  Compliant on apixaban 5 mg twice daily and atorvastatin 10 mg daily without side effects.  Blood pressure today 158/77 -monitored by SLP which has been stable (unable to provide exact numbers). Has since been seen by PCP. Has initial evaluation with cardiologist Dr. Terri Skains this week. No further concerns at this time.       PERTINENT IMAGING  Per recent hospitalization CTH no acute finding CTA head and neck left M2/M3 occlusion S/p IR with TICI2c and then re-occlusion Repeat CT showed left MCA contrast staining but more clear margin of left temporoparietal infarct.  MRI large inferior MCA infarct with petechial hemorrhage 2D Echo EF 55-60% LE venous doppler no DVT LDL 63 HgbA1c 5.5    ROS:   N/A d/t speech impairment   PMH: History reviewed. No pertinent past medical history.  PSH:  Past Surgical History:  Procedure Laterality Date   BUBBLE STUDY  03/01/2021   Procedure: BUBBLE STUDY;  Surgeon: Rex Kras, DO;  Location: [place];  Service: Cardiovascular;;   CARDIOVERSION N/A 03/01/2021   Procedure: CARDIOVERSION;  Surgeon: Rex Kras, DO;  Location: [place];  Service: Cardiovascular;  Laterality: N/A;   CARDIOVERSION N/A 03/05/2021   Procedure: CARDIOVERSION;  Surgeon: Adrian Prows, MD;  Location: [place];  Service: Cardiovascular;  Laterality: N/A;   IR CT HEAD LTD  02/25/2021   IR PERCUTANEOUS ART THROMBECTOMY/INFUSION INTRACRANIAL INC DIAG ANGIO  02/25/2021   RADIOLOGY WITH ANESTHESIA N/A 02/25/2021   Procedure: IR WITH ANESTHESIA;  Surgeon:  Radiologist, Medication, MD;  Location: [place];  Service: Radiology;  Laterality: N/A;   TEE WITHOUT CARDIOVERSION N/A 03/01/2021   Procedure: TRANSESOPHAGEAL ECHOCARDIOGRAM (TEE);  Surgeon: Rex Kras, DO;  Location: [facility];  Service: Cardiovascular;  Laterality: N/A;    Social History:  Social History   Socioeconomic History   Marital status: Widowed    Spouse name: Not on file   Number of children: Not on file   Years of education: Not on file   Highest education level: Not on file  Occupational History   Not on file  Tobacco Use   Smoking status: Never    Passive exposure: Never   Smokeless tobacco: Never  Vaping Use   Vaping Use: Unknown  Substance and Sexual Activity   Alcohol use: Never   Drug use: Never   Sexual activity: Not Currently  Other Topics Concern   Not on file  Social History Narrative   Not on file   Social Determinants of Health   Financial Resource Strain: Not on file  Food Insecurity: Not on file  Transportation Needs: Not on file  Physical Activity: Not on file  Stress: Not on file  Social Connections: Not on file  Intimate Partner Violence: Not on file    Family History: History reviewed. No pertinent family history.  Medications:   Current Outpatient Medications on File Prior to Visit  Medication Sig Dispense Refill   acetaminophen (TYLENOL) 325 MG tablet Take 2 tablets (650 mg total) by mouth every 4 (four) hours as needed for mild pain (or temp > 37.5 C (99.5 F)). 30 tablet 0   amiodarone (PACERONE) 200 MG tablet Take 1 tablet (200 mg total) by mouth daily. 30 tablet 0   apixaban (ELIQUIS) 5 MG TABS tablet Take 1 tablet (5 mg total) by mouth 2 (two) times daily. 60 tablet 0   atorvastatin (LIPITOR) 10 MG tablet Take 1 tablet (10 mg total) by mouth daily. 30 tablet 0   carboxymethylcellulose (REFRESH PLUS) 0.5 % SOLN Place 1 drop into both eyes 4 (four) times daily as needed (dry eyes).     magnesium oxide (MAG-OX) 400 MG tablet  Take 1 tablet (400 mg total) by mouth daily. 30 tablet 0   mometasone-formoterol (DULERA) 100-5 MCG/ACT AERO Inhale 2 puffs into the lungs 2 (two) times daily. 13 g 0   Multiple Vitamins-Minerals (CERTAVITE/ANTIOXIDANTS) TABS Take 1 tablet by mouth daily. 60 tablet 0   omeprazole (PRILOSEC) 20 MG capsule Take 1 capsule (20 mg total) by mouth daily. 30 capsule 0   senna-docusate (SENOKOT-S) 8.6-50 MG tablet Take 2 tablets by mouth 2 (two) times daily.     No current facility-administered medications on file prior to visit.    Allergies:   Allergies  Allergen Reactions   Aspirin     Other reaction(s): Bleeding   Penicillins Hives and Rash   Lisinopril Cough      OBJECTIVE:  Physical Exam  Vitals:   04/29/21 1307  BP: (!) 158/77  Pulse: 70  Weight: 188 lb (85.3 kg)  Height: 5\' 2"  (1.575 m)   Body mass index is 34.39 kg/m. No  results found.  Post stroke PHQ 2/9 Depression screen PHQ 2/9 04/29/2021  Decreased Interest 0  Down, Depressed, Hopeless 1  PHQ - 2 Score 1     General: well developed, well nourished, very pleasant elderly Caucasian female, seated, in no evident distress Head: head normocephalic and atraumatic.   Neck: supple with no carotid or supraclavicular bruits Cardiovascular: regular rate and rhythm, no murmurs Musculoskeletal: no deformity Skin:  no rash/petichiae Vascular:  Normal pulses all extremities   Neurologic Exam Mental Status: Awake and fully alert. moderate expressive and receptive aphasia. Able to follow commands if demonstrated first.  Some difficulty with naming and repetition.  Unable to read "close your eyes" and follow that command.  Difficulty assessing orientation with son providing majority of history.  Mood and affect appropriate.  Cranial Nerves: Fundoscopic exam reveals sharp disc margins. Pupils equal, briskly reactive to light. Extraocular movements full without nystagmus. Visual fields full to confrontation. Hearing intact.  Facial sensation intact. Face, tongue, palate moves normally and symmetrically.  Motor: Normal bulk and tone. Normal strength in all tested extremity muscles Sensory.: intact to touch , pinprick , position and vibratory sensation.  Coordination: Rapid alternating movements normal in all extremities. Finger-to-nose and heel-to-shin performed accurately bilaterally. Gait and Station: Arises from chair without difficulty. Stance is normal. Gait demonstrates normal stride length and balance without use of RW. Tandem walk and heel toe with mild difficulty.  Reflexes: 1+ and symmetric. Toes downgoing.     NIHSS  3 Modified Rankin  3      ASSESSMENT: Michelle Dean is a 75 y.o. 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 fib. Vascular risk factors include new dx of A fib, HTN, HLD and advanced age.      PLAN:  Left MCA stroke:  Residual deficit: Moderate aphasia. Continue working with [facility] SLP with likely transition to outpatient SLP once completed. Due to continued difficulty with reading comprehension, would not recommend return to driving at this time but did provide son with list of driving rehab's to have this officially evaluated Continue Eliquis (apixaban) daily  and atorvastatin 10 mg daily for secondary stroke prevention.   Discussed secondary stroke prevention measures and importance of close PCP follow up for aggressive stroke risk factor management. I have gone over the pathophysiology of stroke, warning signs and symptoms, risk factors and their management in some detail with instructions to go to the closest emergency room for symptoms of concern. Atrial fibrillation: On Eliquis 5 mg twice daily for CHA2DS2-VASc score of at least 6. Initial eval with cardiology this week.  Remains on amiodarone currently NSR.  HTN: BP goal <130/90.   Slightly elevated today although stable at home per report on nonpharmacological management HLD: LDL goal <70. Recent LDL 63 on atorvastatin 10 mg daily per PCP.      Follow up in 3 months or call earlier if needed   CC:  [facility] provider: Dr. Leonie Man PCP: Fannie Knee, MD    I spent 59 minutes of face-to-face and non-face-to-face time with patient and son.  This included previsit chart review including review of recent hospitalization, lab review, study review, electronic health record documentation, patient and son education regarding recent stroke including etiology, secondary stroke prevention measures and importance of managing stroke risk factors, residual deficits and typical recovery time and answered  all other questions to patient and sons satisfaction  Ihor Austin, [facility]  [facility] [address] [place], [place] [address]  Phone [phone] Fax [phone] Note: This document was prepared with digital dictation and possible smart phrase technology. Any transcriptional errors that result from this process are unintentional.

## 2021-05-01 ENCOUNTER — Ambulatory Visit: Payer: Medicare Other | Admitting: Cardiology

## 2021-05-01 ENCOUNTER — Other Ambulatory Visit: Payer: Self-pay

## 2021-05-01 ENCOUNTER — Encounter: Payer: Self-pay | Admitting: Cardiology

## 2021-05-01 VITALS — BP 136/73 | HR 76 | Temp 98.3°F | Resp 17 | Ht 62.0 in | Wt 188.2 lb

## 2021-05-01 DIAGNOSIS — E782 Mixed hyperlipidemia: Secondary | ICD-10-CM

## 2021-05-01 DIAGNOSIS — I10 Essential (primary) hypertension: Secondary | ICD-10-CM

## 2021-05-01 DIAGNOSIS — I700 Atherosclerosis of aorta: Secondary | ICD-10-CM

## 2021-05-01 DIAGNOSIS — J449 Chronic obstructive pulmonary disease, unspecified: Secondary | ICD-10-CM

## 2021-05-01 DIAGNOSIS — I480 Paroxysmal atrial fibrillation: Secondary | ICD-10-CM

## 2021-05-01 DIAGNOSIS — R911 Solitary pulmonary nodule: Secondary | ICD-10-CM

## 2021-05-01 DIAGNOSIS — I5032 Chronic diastolic (congestive) heart failure: Secondary | ICD-10-CM

## 2021-05-01 DIAGNOSIS — I6602 Occlusion and stenosis of left middle cerebral artery: Secondary | ICD-10-CM

## 2021-05-01 DIAGNOSIS — Z7901 Long term (current) use of anticoagulants: Secondary | ICD-10-CM

## 2021-05-01 DIAGNOSIS — Z79899 Other long term (current) drug therapy: Secondary | ICD-10-CM

## 2021-05-01 DIAGNOSIS — Z87891 Personal history of nicotine dependence: Secondary | ICD-10-CM

## 2021-05-01 MED ORDER — VERAPAMIL HCL ER 120 MG PO TBCR: 120.0000 mg | EXTENDED_RELEASE_TABLET | Freq: Every day | ORAL | 0 refills | Status: DC

## 2021-05-01 NOTE — Progress Notes (Signed)
I agree with the above plan 

## 2021-05-01 NOTE — Progress Notes (Signed)
Date:  05/01/2021   ID:  Michelle Dean, DOB [birth year], MRN [id number]  PCP:  Fannie Knee, MD  Cardiologist:  Rex Kras, DO, [facility] (established care 02/27/2021)  Date: 05/01/21 Last Visit: 03/07/2021  Chief Complaint  Patient presents with   Follow-up   Atrial Fibrillation    HPI  Michelle Dean is a 75 y.o. Caucasian female who presents to the office with a chief complaint of " atrial fibrillation and heart failure management." Patient's past medical history and cardiovascular risk factors include: Paroxysmal atrial fibrillation, status post cardioversion, long-term oral anticoagulation/antiarrhythmic medications, chronic HFpEF, left MCA stroke, hyperlipidemia, COPD, pulmonary nodules, hypertension, aortic atherosclerosis, advanced age, postmenopausal female.   Patient was initially seen in the hospital for atrial fibrillation management.  She now presents to the office accompanied by her [name] to be established care with cardiology post hospitalization.  Initially presented to the ED on 02/25/2021 with acute onset of confusion/expressive and receptive aphasia.  CT of the head was performed was negative for hemorrhage and received TNKase.  CTA noted left M2/M3 occlusion and was transferred to [facility] for angiogram and possible thrombectomy. Patient is status post partial recannulation with reocclusion followed by mechanical thrombolysis achieving TICI 2C revascularization.   Postprocedure patient was found to be in A. fib with RVR and cardiology is consulted for management of atrial fibrillation.  She was initially managed with parenteral medications; however, due to poor ventricular rate control/rhythm control she underwent TEE guided cardioversion.  She converted to normal sinus rhythm for short period of time however reverted back to A. fib.  She was later loaded with amiodarone and due to continued HFpEF most likely due to A. fib with RVR underwent direct-current  cardioversion with my partner Dr. Einar Gip and since then has remained in normal sinus rhythm.  She now follows up in the office to establish care given her diagnosis of paroxysmal atrial fibrillation and HFpEF.  Patient denies any chest pain or shortness of breath at rest or with effort related activities.  She remains euvolemic.  Her blood pressures are now better controlled and not requiring midodrine.  She does not have a blood pressure cuff at home but states that recently they have been elevated.  She does not check her daily weights and was not aware of salt restrictions.  Patient's son states that she is doing overall well since discharge from the hospitalization but at times continues to have both expressive and receptive aphasia.  However she is moving all 4 extremities and ambulating.  FUNCTIONAL STATUS: No structured exercise program or daily routine.   ALLERGIES: Allergies  Allergen Reactions   Aspirin     Other reaction(s): Bleeding   Penicillins Hives and Rash   Lisinopril Cough    MEDICATION LIST PRIOR TO VISIT: Current Meds  Medication Sig   Acetaminophen (ACETAMINOPHEN EXTRA STRENGTH) 500 MG capsule Take 1 tablet by mouth daily.   amiodarone (PACERONE) 200 MG tablet Take 1 tablet (200 mg total) by mouth daily.   apixaban (ELIQUIS) 5 MG TABS tablet Take 1 tablet (5 mg total) by mouth 2 (two) times daily.   atorvastatin (LIPITOR) 10 MG tablet Take 1 tablet (10 mg total) by mouth daily.   carboxymethylcellulose (REFRESH PLUS) 0.5 % SOLN Place 1 drop into both eyes 4 (four) times daily as needed (dry eyes).   losartan (COZAAR) 25 MG tablet Take 25 mg by mouth daily.   magnesium oxide (MAG-OX) 400 MG tablet Take 1 tablet (400 mg total)  by mouth daily.   mometasone-formoterol (DULERA) 100-5 MCG/ACT AERO Inhale 2 puffs into the lungs 2 (two) times daily.   Multiple Vitamins-Minerals (CERTAVITE/ANTIOXIDANTS) TABS Take 1 tablet by mouth daily.   omeprazole (PRILOSEC) 20 MG capsule  Take 1 capsule (20 mg total) by mouth daily.   senna-docusate (SENOKOT-S) 8.6-50 MG tablet Take 2 tablets by mouth 2 (two) times daily.   umeclidinium bromide (INCRUSE ELLIPTA) 62.5 MCG/ACT AEPB Inhale 1 puff into the lungs daily.   verapamil (CALAN-SR) 120 MG CR tablet Take 1 tablet (120 mg total) by mouth at bedtime.     PAST MEDICAL HISTORY: Past Medical History:  Diagnosis Date   (HFpEF) heart failure with preserved ejection fraction ([place])    Hyperlipidemia    Hypertension    Paroxysmal atrial fibrillation (HCC)    RBBB    Stroke [facility])     PAST SURGICAL HISTORY: Past Surgical History:  Procedure Laterality Date   BUBBLE STUDY  03/01/2021   Procedure: BUBBLE STUDY;  Surgeon: Rex Kras, DO;  Location: [place];  Service: Cardiovascular;;   CARDIOVERSION N/A 03/01/2021   Procedure: CARDIOVERSION;  Surgeon: Rex Kras, DO;  Location: [place];  Service: Cardiovascular;  Laterality: N/A;   CARDIOVERSION N/A 03/05/2021   Procedure: CARDIOVERSION;  Surgeon: Adrian Prows, MD;  Location: [place];  Service: Cardiovascular;  Laterality: N/A;   IR CT HEAD LTD  02/25/2021   IR PERCUTANEOUS ART THROMBECTOMY/INFUSION INTRACRANIAL INC DIAG ANGIO  02/25/2021   RADIOLOGY WITH ANESTHESIA N/A 02/25/2021   Procedure: IR WITH ANESTHESIA;  Surgeon: Radiologist, Medication, MD;  Location: [place];  Service: Radiology;  Laterality: N/A;   TEE WITHOUT CARDIOVERSION N/A 03/01/2021   Procedure: TRANSESOPHAGEAL ECHOCARDIOGRAM (TEE);  Surgeon: Rex Kras, DO;  Location: [facility];  Service: Cardiovascular;  Laterality: N/A;    FAMILY HISTORY: The patient family history includes Cancer (age of onset: 31) in her mother; Colon cancer in her sister; Heart disease in her mother; Kidney failure in her father.  SOCIAL HISTORY:  The patient  reports that she quit smoking about 22 years ago. Her smoking use included cigarettes. She has a 27.00 pack-year smoking history. She has never been  exposed to tobacco smoke. She has never used smokeless tobacco. She reports that she does not drink alcohol and does not use drugs.  REVIEW OF SYSTEMS: Review of Systems  Constitutional: Negative for chills and fever.  HENT:  Negative for hoarse voice and nosebleeds.   Eyes:  Negative for discharge, double vision and pain.  Cardiovascular:  Negative for chest pain, claudication, dyspnea on exertion, leg swelling, near-syncope, orthopnea, palpitations, paroxysmal nocturnal dyspnea and syncope.  Respiratory:  Negative for hemoptysis and shortness of breath.   Musculoskeletal:  Negative for muscle cramps and myalgias.  Gastrointestinal:  Negative for abdominal pain, constipation, diarrhea, hematemesis, hematochezia, melena, nausea and vomiting.  Neurological:  Negative for dizziness and light-headedness.       Degree of expressive and receptive aphasia - per son.    PHYSICAL EXAM: Vitals with BMI 05/01/2021 04/29/2021 04/10/2021  Height 5\' 2"  5\' 2"  -  Weight 188 lbs 3 oz 188 lbs -  BMI [id number] -  Systolic [id number]  Diastolic 73 77 69  Pulse 76 70 82    CONSTITUTIONAL: Appears older than stated age, hemodynamically stable, no acute distress.  SKIN: Skin is warm and dry. No rash noted. No cyanosis. No pallor. No jaundice HEAD: Normocephalic and atraumatic.  EYES: No scleral icterus MOUTH/THROAT: Moist oral membranes.  NECK: No JVD present. No thyromegaly noted. No carotid bruits  LYMPHATIC: No visible cervical adenopathy.  CHEST Normal respiratory effort. No intercostal retractions  LUNGS: Clear to auscultation bilaterally.  No stridor. No wheezes. No rales.  CARDIOVASCULAR: Regular rate and rhythm, positive S1-S2, no murmurs rubs or gallops appreciated. ABDOMINAL: Obese, soft, nontender, nondistended, positive bowel sounds in all 4 quadrants, no apparent ascites.  EXTREMITIES: No peripheral edema, warm to touch, 2+ bilateral DP and PT pulses HEMATOLOGIC: No significant  bruising NEUROLOGIC: Oriented to person, place, and time. Nonfocal. Normal muscle tone.  PSYCHIATRIC: Normal mood and affect. Normal behavior. Cooperative  RADIOLOGY: CT PE protocol 04/10/2021 1. No pulmonary embolus or acute aortic syndrome. 2. Hiatal hernia. 3. Multiple pulmonary nodules. Most severe: 10 mm right lower lobe pulmonary nodule. Consider a non-contrast Chest CT at 3 months, aPET/CT, or tissue sampling. These guidelines do not apply to immunocompromised patients and patients with cancer. Follow up in patients with significant comorbidities as clinically warranted. For lung cancer screening, adhere to Lung-RADS guidelines. Reference: Radiology. 2017; 284(1):228-43. 4. Aortic Atherosclerosis (ICD10-I70.0) and Emphysema (ICD10-J43.9).  CARDIAC DATABASE: EKG: 02/27/2021: Atrial flutter 2:1, 130bpm, right bundle branch block.  No prior EKGs available for comparison.   03/05/2021: Normal sinus rhythm, 66 bpm, right bundle branch block without underlying injury pattern.  05/01/2021: Normal sinus 75 bpm, IVCD suggestive of RBBB, without underlying injury pattern.   Echocardiogram: 02/26/2021: 1. Left ventricular ejection fraction, by estimation, is 55 to 60%. The  left ventricle has normal function. The left ventricle has no regional  wall motion abnormalities. Left ventricular diastolic parameters are  consistent with Grade II diastolic dysfunction (pseudonormalization).   2. Right ventricular systolic function is normal. The right ventricular  size is mildly enlarged. Tricuspid regurgitation signal is inadequate for  assessing PA pressure.   3. Left atrial size was mildly dilated.   4. Right atrial size was mildly dilated.   5. The mitral valve is normal in structure. No evidence of mitral valve regurgitation. No evidence of mitral stenosis.   6. The aortic valve is tricuspid. Aortic valve regurgitation is not visualized. Mild aortic valve sclerosis is present, with no evidence  of aortic valve stenosis.   7. The inferior vena cava is dilated in size with >50% respiratory variability, suggesting right atrial pressure of 8 mmHg.    TEE guided DDCV:  03/01/2021:  1. Left ventricular ejection fraction, by estimation, is 50 to 55%. The  left ventricle has low normal function. The left ventricle has no regional  wall motion abnormalities.   2. Right ventricular systolic function is normal. The right ventricular  size is mildly enlarged.   3. Left atrial size was mildly dilated. No left atrial/left atrial  appendage thrombus was detected. The LAA emptying velocity was 61 cm/s.   4. Right atrial size was mildly dilated.   5. The mitral valve is normal in structure. Mild mitral valve  regurgitation. No evidence of mitral stenosis.   6. The aortic valve is normal in structure. Aortic valve regurgitation is  not visualized. No aortic stenosis is present.   7. There is mild (Grade II) plaque involving the descending aorta.   8. Agitated saline contrast bubble study was negative, with no evidence  of any interatrial shunt A successful direct current cardioversion was performed at 100 joules with 1 attempt. Transitioned to normal sinus post direct current cardioversion; however, converted back to atrial fibrillation by the time she was in post-procedural holding.    DCCV:  03/01/2021: 100J x 1 Converted to SR; reverted back to Afib. 03/05/2021: 100J x 1 Converted to SR.    LABORATORY DATA: CBC Latest Ref Rng & Units 04/10/2021 03/17/2021 03/08/2021  WBC 4.0 - 10.5 K/uL 13.3(H) 8.0 11.0(H)  Hemoglobin 12.0 - 15.0 g/dL 11.3(L) 12.0 11.1(L)  Hematocrit 36.0 - 46.0 % 36.3 39.1 34.7(L)  Platelets 150 - 400 K/uL 381 336 319    CMP Latest Ref Rng & Units 04/10/2021 03/17/2021 03/11/2021  Glucose 70 - 99 mg/dL 767(H) 419(F) 790(W)  BUN 8 - 23 mg/dL 7(L) 14 21  Creatinine 0.44 - 1.00 mg/dL 4.09 7.35(H) 2.99(M)  Sodium 135 - 145 mmol/L 140 138 139  Potassium 3.5 - 5.1 mmol/L  3.4(L) 4.6 4.3  Chloride 98 - 111 mmol/L 104 105 99  CO2 22 - 32 mmol/L 29 25 30   Calcium 8.9 - 10.3 mg/dL ) 9.0 9.2  Total Protein 6.5 - 8.1 g/dL - - -  Total Bilirubin 0.3 - 1.2 mg/dL - - -  Alkaline Phos 38 - 126 U/L - - -  AST 15 - 41 U/L - - -  ALT 0 - 44 U/L - - -    Lipid Panel     Component Value Date/Time   CHOL 131 02/26/2021 0525   TRIG 55 02/26/2021 0525   HDL 57 02/26/2021 0525   CHOLHDL 2.3 02/26/2021 0525   VLDL 11 02/26/2021 0525   LDLCALC 63 02/26/2021 0525    No components found for: NTPROBNP No results for input(s): PROBNP in the last 8760 hours. Recent Labs    02/27/21 1211  TSH 0.633    BMP Recent Labs    03/11/21 0920 03/17/21 0710 04/10/21 1018  NA 139 138 140  K 4.3 4.6 3.4*  CL 99 105 104  CO2 30 25 29   GLUCOSE 109* 130* 118*  BUN 21 14 7*  CREATININE 1.29* 1.10* 0.69  CALCIUM 9.2 9.0 8.6*  GFRNONAA 43* 52* >60    HEMOGLOBIN A1C Lab Results  Component Value Date   HGBA1C 5.5 02/26/2021   MPG 111.15 02/26/2021    IMPRESSION:    ICD-10-CM   1. Paroxysmal A-fib (HCC)  I48.0 EKG 12-Lead    verapamil (CALAN-SR) 120 MG CR tablet    Basic metabolic panel    Magnesium    Pro b natriuretic peptide (BNP)    2. Long term (current) use of anticoagulants  Z79.01     3. Long term current use of antiarrhythmic drug  Z79.899     4. Chronic heart failure with preserved ejection fraction (HFpEF) (HCC)  I50.32     5. 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 Benign hypertension  I10     7. Mixed hyperlipidemia  E78.2     8. Atherosclerosis of aorta (HCC)  I70.0     9. Chronic obstructive pulmonary disease, unspecified COPD type (HCC)  J44.9     10. Pulmonary nodule  R91.1        RECOMMENDATIONS: Michelle Dean is a 75 y.o. Caucasian female whose past medical history and cardiac risk factors include: Paroxysmal atrial fibrillation, status post cardioversion, long-term oral anticoagulation/antiarrhythmic  medications, chronic HFpEF, left MCA stroke, hyperlipidemia, COPD, pulmonary nodules, hypertension, aortic atherosclerosis, advanced age, postmenopausal female.  Paroxysmal atrial fibrillation  Rate control: Verapamil Rhythm control: Amiodarone Thromboembolic prophylaxis: Eliquis Michelle Dean SCORE is 5 which correlates to 7.2% risk of stroke per year.   Status post TEE guided cardioversion on 03/01/2021 -converted to  SR but shortly reverted back to A. fib.   After IV amiodarone loading patient underwent direct-current cardioversion on 03/05/2021 restored normal sinus rhythm.   EKG today shows normal sinus rhythm. Will start the patient on verapamil 120 mg p.o. daily with goals of better rate control in the future would like to reduce the dose of amiodarone to 100 mg p.o. daily. Will monitor closely.  Long-term oral anticoagulation: Indication: Paroxysmal atrial fibrillation. Reemphasized the risks, benefits, and alternatives to oral anticoagulation. Patient has been educated on the importance of monitoring for evidence of bleeding which includes but not limited to hemoptysis, hematochezia, melanotic stools, and hematuria. Patient educated on fall precautions and if she was to be injured despite the mechanism of injury she is to seek medical attention at the closest ER since she is on blood thinners.  Patient understands importance of this because if internal bleeding is not treated in a timely manner it may further lead to morbidity and/or mortality.  Patient voices understanding of these recommendations and provides verbal feedback.   Long-term antiarrhythmic medications: To restore normal sinus rhythm as the patient was experiencing acute HFpEF due to A. fib with RVR she was started on amiodarone during hospitalization. Currently on amiodarone 200 mg p.o. daily. Would like to transition her to amiodarone 100 mg p.o. daily at the next visit. TSH: Last checked 02/27/2021 AST ALT: Last checked  03/08/2021 Chest x-ray: 04/10/2021 PFTs: Need to be ordered Annual eye exam: We will discuss at the next visit   Chronic heart failure with preserved EF stage C, NYHA class II: Diagnosed in the setting of A. fib with RVR. Echo 02/2021: LVEF 55 to 123456, grade 2 diastolic dysfunction, no significant valvular heart disease, estimated RAP 8 mmHg. Up titration of GDMT was not feasible during her last hospitalization due to iatrogenic hypotension requiring midodrine support. For now patient is started on verapamil for better rate control. She is enrolled into the CCM and PCM for heart failure and blood pressure management respectively. Will uptitrate GDMT once we know her blood pressures and her daily weights. Will check BMP, NT proBNP, and magnesium prior to the next office visit.   Left MCA stroke: Likely cardioembolic in the setting of newly diagnosed A. fib  Follow-up with neurology 04/29/2021 office visit reviewed. Educated importance of secondary prevention BP goal <130/90 - Will enroll into principal care management. LDL goal <70 -02/2021 LDL 63 mg/dL. Maintenance of normal sinus rhythm Glycemic control Improving her modifiable cardiovascular risk factors.   Hyperlipidemia:  Currently on atorvastatin.   She denies myalgia or other side effects. Most recent lipids dated 02/2021 reviewed as noted above -LDL is less than 70 mg/dL  FINAL MEDICATION LIST END OF ENCOUNTER: Meds ordered this encounter  Medications   verapamil (CALAN-SR) 120 MG CR tablet    Sig: Take 1 tablet (120 mg total) by mouth at bedtime.    Dispense:  90 tablet    Refill:  0    Medications Discontinued During This Encounter  Medication Reason   acetaminophen (TYLENOL) 325 MG tablet      Current Outpatient Medications:    Acetaminophen (ACETAMINOPHEN EXTRA STRENGTH) 500 MG capsule, Take 1 tablet by mouth daily., Disp: , Rfl:    amiodarone (PACERONE) 200 MG tablet, Take 1 tablet (200 mg total) by mouth  daily., Disp: 30 tablet, Rfl: 0   apixaban (ELIQUIS) 5 MG TABS tablet, Take 1 tablet (5 mg total) by mouth 2 (two) times daily., Disp: 60 tablet, Rfl: 0  atorvastatin (LIPITOR) 10 MG tablet, Take 1 tablet (10 mg total) by mouth daily., Disp: 30 tablet, Rfl: 0   carboxymethylcellulose (REFRESH PLUS) 0.5 % SOLN, Place 1 drop into both eyes 4 (four) times daily as needed (dry eyes)., Disp: , Rfl:    losartan (COZAAR) 25 MG tablet, Take 25 mg by mouth daily., Disp: , Rfl:    magnesium oxide (MAG-OX) 400 MG tablet, Take 1 tablet (400 mg total) by mouth daily., Disp: 30 tablet, Rfl: 0   mometasone-formoterol (DULERA) 100-5 MCG/ACT AERO, Inhale 2 puffs into the lungs 2 (two) times daily., Disp: 13 g, Rfl: 0   Multiple Vitamins-Minerals (CERTAVITE/ANTIOXIDANTS) TABS, Take 1 tablet by mouth daily., Disp: 60 tablet, Rfl: 0   omeprazole (PRILOSEC) 20 MG capsule, Take 1 capsule (20 mg total) by mouth daily., Disp: 30 capsule, Rfl: 0   senna-docusate (SENOKOT-S) 8.6-50 MG tablet, Take 2 tablets by mouth 2 (two) times daily., Disp: , Rfl:    umeclidinium bromide (INCRUSE ELLIPTA) 62.5 MCG/ACT AEPB, Inhale 1 puff into the lungs daily., Disp: , Rfl:    verapamil (CALAN-SR) 120 MG CR tablet, Take 1 tablet (120 mg total) by mouth at bedtime., Disp: 90 tablet, Rfl: 0  Orders Placed This Encounter  Procedures   Basic metabolic panel   Magnesium   Pro b natriuretic peptide (BNP)   EKG 12-Lead    There are no Patient Instructions on file for this visit.   --Continue cardiac medications as reconciled in final medication list. --Return in about 6 weeks (around 06/12/2021) for Follow up, A. fib, heart failure management.. Or sooner if needed. --Continue follow-up with your primary care physician regarding the management of your other chronic comorbid conditions.  Patient's questions and concerns were addressed to her satisfaction. She voices understanding of the instructions provided during this encounter.   This  note was created using a voice recognition software as a result there may be grammatical errors inadvertently enclosed that do not reflect the nature of this encounter. Every attempt is made to correct such errors.  Total encounter time 65 minutes. *Total Encounter Time as defined by the Centers for Medicare and Medicaid Services includes, in addition to the face-to-face time of a patient visit (documented in the note above) non-face-to-face time: obtaining and reviewing outside history, ordering and reviewing medications, tests or procedures, care coordination (communications with other health care professionals or caregivers) and documentation in the medical record.  Reviewed the recent records from her hospitalization including discharge summary, recent neurology office note dated 04/29/2021, CT PE protocol study results, diagnostic cardiovascular testing results, discuss management for atrial fibrillation and HFpEF, ordering medications and diagnostic lab work.  She is also enrolled into principal care management for blood pressure and CCM for heart failure.  Had a long discussion with the patient's son at today's office visit as well.   Rex Kras, [place], [facility]  Pager: [phone] Office: [phone]

## 2021-05-02 ENCOUNTER — Ambulatory Visit: Payer: Medicare Other | Admitting: Cardiology

## 2021-05-16 ENCOUNTER — Other Ambulatory Visit: Payer: Self-pay

## 2021-05-16 DIAGNOSIS — I480 Paroxysmal atrial fibrillation: Secondary | ICD-10-CM

## 2021-06-05 LAB — BASIC METABOLIC PANEL
BUN/Creatinine Ratio: 13 (ref 12–28)
BUN: 13 mg/dL (ref 8–27)
CO2: 24 mmol/L (ref 20–29)
Calcium: 9.6 mg/dL (ref 8.7–10.3)
Chloride: 108 mmol/L — ABNORMAL HIGH (ref 96–106)
Creatinine, Ser: 1 mg/dL (ref 0.57–1.00)
Glucose: 92 mg/dL (ref 70–99)
Potassium: 4.9 mmol/L (ref 3.5–5.2)
Sodium: 150 mmol/L — ABNORMAL HIGH (ref 134–144)
eGFR: 58 mL/min/{1.73_m2} — ABNORMAL LOW (ref >59)

## 2021-06-05 LAB — MAGNESIUM: Magnesium: 2.1 mg/dL (ref 1.6–2.3)

## 2021-06-05 LAB — PRO B NATRIURETIC PEPTIDE: NT-Pro BNP: 570 pg/mL (ref 0–738)

## 2021-06-07 NOTE — Progress Notes (Signed)
Called and reviewed results with pt's son. Per son, pt has been making significant effort in reducing her salt intake. Using Ms. Dash and other salt alternatives. Home BP continues to remain stable and controlled. Per son, pt has been feeling significantly better and hasnt further episodes of lightheadedness, dizziness, SOB, CP, syncope episode. Encouraged pt to maintain adequate fluid intake and bring her home medication to her upcoming OV.

## 2021-06-07 NOTE — Progress Notes (Signed)
Called and spoke to pts son, he will relay the message to the pt. He voiced understanding.

## 2021-06-13 ENCOUNTER — Encounter: Payer: Self-pay | Admitting: Cardiology

## 2021-06-13 ENCOUNTER — Other Ambulatory Visit: Payer: Self-pay

## 2021-06-13 ENCOUNTER — Ambulatory Visit: Payer: Medicare Other | Admitting: Cardiology

## 2021-06-13 VITALS — BP 149/77 | HR 71 | Temp 98.2°F | Resp 16 | Ht 62.0 in | Wt 189.0 lb

## 2021-06-13 DIAGNOSIS — Z79899 Other long term (current) drug therapy: Secondary | ICD-10-CM

## 2021-06-13 DIAGNOSIS — I700 Atherosclerosis of aorta: Secondary | ICD-10-CM

## 2021-06-13 DIAGNOSIS — Z7901 Long term (current) use of anticoagulants: Secondary | ICD-10-CM

## 2021-06-13 DIAGNOSIS — I6602 Occlusion and stenosis of left middle cerebral artery: Secondary | ICD-10-CM

## 2021-06-13 DIAGNOSIS — I480 Paroxysmal atrial fibrillation: Secondary | ICD-10-CM

## 2021-06-13 DIAGNOSIS — I5032 Chronic diastolic (congestive) heart failure: Secondary | ICD-10-CM

## 2021-06-13 DIAGNOSIS — Z87891 Personal history of nicotine dependence: Secondary | ICD-10-CM

## 2021-06-13 DIAGNOSIS — I10 Essential (primary) hypertension: Secondary | ICD-10-CM

## 2021-06-13 DIAGNOSIS — E870 Hyperosmolality and hypernatremia: Secondary | ICD-10-CM

## 2021-06-13 DIAGNOSIS — E782 Mixed hyperlipidemia: Secondary | ICD-10-CM

## 2021-06-13 MED ORDER — AMIODARONE HCL 200 MG PO TABS: 100.0000 mg | ORAL_TABLET | Freq: Every day | ORAL | 1 refills | Status: DC

## 2021-06-13 MED ORDER — APIXABAN 5 MG PO TABS: 5.0000 mg | ORAL_TABLET | Freq: Two times a day (BID) | ORAL | 0 refills | Status: DC

## 2021-06-13 MED ORDER — VERAPAMIL HCL ER 240 MG PO TBCR: 240.0000 mg | EXTENDED_RELEASE_TABLET | Freq: Every morning | ORAL | 0 refills | Status: DC

## 2021-06-13 NOTE — Progress Notes (Signed)
Date:  06/13/2021   ID:  Michelle Dean, DOB November 28, 1945, MRN [id number]  PCP:  Fannie Knee, MD  Cardiologist:  Rex Kras, DO, [facility] (established care 02/27/2021)  Date: 06/13/21 Last Office Visit: 05/01/2021  Chief Complaint  Patient presents with   Atrial Fibrillation   heart failure management   Follow-up    HPI  Michelle Dean is a 76 y.o. Caucasian female who presents to the office with a chief complaint of " heart failure and atrial fibrillation management." Patient's past medical history and cardiovascular risk factors include: Paroxysmal atrial fibrillation, status post cardioversion, long-term oral anticoagulation/antiarrhythmic medications, chronic HFpEF, left MCA stroke, hyperlipidemia, COPD, pulmonary nodules, hypertension, aortic atherosclerosis, advanced age, postmenopausal female.   Patient was initially seen in the hospital for atrial fibrillation management.  She now presents to the office accompanied by her son Marcelina Morel to be established care with cardiology post hospitalization.  Presented to the ED on 02/25/2021 with acute onset of confusion/expressive and receptive aphasia.  CT of the head was performed was negative for hemorrhage and received TNKase.  CTA noted left M2/M3 occlusion and was transferred to [facility] for angiogram and possible thrombectomy. Patient is status post partial recannulation with reocclusion followed by mechanical thrombolysis achieving TICI 2C revascularization.   Postprocedure patient was found to be in A. fib with RVR and cardiology is consulted for management of atrial fibrillation.  Patient was initially on parenteral medication without successful conversion to normal sinus rhythm.  She underwent TEE guided cardioversion which converted her to NSR but shortly thereafter reverted back into A. fib.  She was loaded with IV amiodarone and scheduled for repeat cardioversion and since then remains in normal sinus rhythm.  Based on her symptoms  and repeat echocardiogram patient has heart failure with preserved EF and her medications were slowly uptitrated given her hemodynamics and laboratory values.  Since last office visit patient is doing well from a cardiovascular standpoint.  She was enrolled into principal care management for congestive heart failure.  Average blood pressure is 134/78 with a pulse of 63.  Weight remains relatively stable since last office visit.  She is compliant with her medications.  Recent labs from 05/31/2021 independently reviewed.  Patient has hypernatremia with a sodium level of 150 but asymptomatic.  It appears the patient is only drinking 10 ounces of water per day.  I have asked her to increase her fluid consumption to up to 1500 cc/day.   FUNCTIONAL STATUS: No structured exercise program or daily routine.   ALLERGIES: Allergies  Allergen Reactions   Aspirin     Other reaction(s): Bleeding   Penicillins Hives and Rash   Lisinopril Cough    MEDICATION LIST PRIOR TO VISIT: Current Meds  Medication Sig   Acetaminophen (ACETAMINOPHEN EXTRA STRENGTH) 500 MG capsule Take 1 tablet by mouth daily.   atorvastatin (LIPITOR) 10 MG tablet Take 1 tablet (10 mg total) by mouth daily.   carboxymethylcellulose (REFRESH PLUS) 0.5 % SOLN Place 1 drop into both eyes 4 (four) times daily as needed (dry eyes).   cholecalciferol (VITAMIN D) 25 MCG (1000 UNIT) tablet Take by mouth.   losartan (COZAAR) 25 MG tablet Take 25 mg by mouth daily.   magnesium oxide (MAG-OX) 400 MG tablet Take 1 tablet (400 mg total) by mouth daily.   mometasone-formoterol (DULERA) 100-5 MCG/ACT AERO Inhale 2 puffs into the lungs 2 (two) times daily.   Multiple Vitamins-Minerals (CERTAVITE/ANTIOXIDANTS) TABS Take 1 tablet by mouth daily.   omeprazole (PRILOSEC)  20 MG capsule Take 1 capsule (20 mg total) by mouth daily.   senna-docusate (SENOKOT-S) 8.6-50 MG tablet Take 2 tablets by mouth 2 (two) times daily.   umeclidinium bromide (INCRUSE  ELLIPTA) 62.5 MCG/ACT AEPB Inhale 1 puff into the lungs daily.   [DISCONTINUED] amiodarone (PACERONE) 200 MG tablet Take 1 tablet (200 mg total) by mouth daily.   [DISCONTINUED] apixaban (ELIQUIS) 5 MG TABS tablet Take 1 tablet (5 mg total) by mouth 2 (two) times daily.   [DISCONTINUED] verapamil (CALAN-SR) 120 MG CR tablet Take 1 tablet (120 mg total) by mouth at bedtime.     PAST MEDICAL HISTORY: Past Medical History:  Diagnosis Date   (HFpEF) heart failure with preserved ejection fraction ([place])    Hyperlipidemia    Hypertension    Paroxysmal atrial fibrillation (HCC)    RBBB    Stroke [facility])     PAST SURGICAL HISTORY: Past Surgical History:  Procedure Laterality Date   BUBBLE STUDY  03/01/2021   Procedure: BUBBLE STUDY;  Surgeon: Rex Kras, DO;  Location: [place];  Service: Cardiovascular;;   CARDIOVERSION N/A 03/01/2021   Procedure: CARDIOVERSION;  Surgeon: Rex Kras, DO;  Location: [place];  Service: Cardiovascular;  Laterality: N/A;   CARDIOVERSION N/A 03/05/2021   Procedure: CARDIOVERSION;  Surgeon: Adrian Prows, MD;  Location: [place];  Service: Cardiovascular;  Laterality: N/A;   IR CT HEAD LTD  02/25/2021   IR PERCUTANEOUS ART THROMBECTOMY/INFUSION INTRACRANIAL INC DIAG ANGIO  02/25/2021   RADIOLOGY WITH ANESTHESIA N/A 02/25/2021   Procedure: IR WITH ANESTHESIA;  Surgeon: Radiologist, Medication, MD;  Location: [place];  Service: Radiology;  Laterality: N/A;   TEE WITHOUT CARDIOVERSION N/A 03/01/2021   Procedure: TRANSESOPHAGEAL ECHOCARDIOGRAM (TEE);  Surgeon: Rex Kras, DO;  Location: [facility];  Service: Cardiovascular;  Laterality: N/A;    FAMILY HISTORY: The patient family history includes Cancer (age of onset: 50) in her mother; Colon cancer in her sister; Heart disease in her mother; Kidney failure in her father.  SOCIAL HISTORY:  The patient  reports that she quit smoking about 23 years ago. Her smoking use included cigarettes. She has a  27.00 pack-year smoking history. She has never been exposed to tobacco smoke. She has never used smokeless tobacco. She reports that she does not drink alcohol and does not use drugs.  REVIEW OF SYSTEMS: Review of Systems  Constitutional: Negative for chills and fever.  HENT:  Negative for hoarse voice and nosebleeds.   Eyes:  Negative for discharge, double vision and pain.  Cardiovascular:  Negative for chest pain, claudication, dyspnea on exertion, leg swelling, near-syncope, orthopnea, palpitations, paroxysmal nocturnal dyspnea and syncope.  Respiratory:  Negative for hemoptysis and shortness of breath.   Musculoskeletal:  Negative for muscle cramps and myalgias.  Gastrointestinal:  Negative for abdominal pain, constipation, diarrhea, hematemesis, hematochezia, melena, nausea and vomiting.  Neurological:  Negative for dizziness and light-headedness.       Degree of expressive and receptive aphasia - per son.    PHYSICAL EXAM: Vitals with BMI 06/13/2021 05/01/2021 04/29/2021  Height 5\' 2"  5\' 2"  5\' 2"   Weight 189 lbs 188 lbs 3 oz 188 lbs  BMI 34.56 [id number]  Systolic 123456 [id number]  Diastolic 77 73 77  Pulse 71 76 70    CONSTITUTIONAL: Appears older than stated age, hemodynamically stable, no acute distress.  SKIN: Skin is warm and dry. No rash noted. No cyanosis. No pallor. No jaundice HEAD: Normocephalic and atraumatic.  EYES: No scleral icterus  MOUTH/THROAT: Moist oral membranes.  NECK: No JVD present. No thyromegaly noted. No carotid bruits  LYMPHATIC: No visible cervical adenopathy.  CHEST Normal respiratory effort. No intercostal retractions  LUNGS: Clear to auscultation bilaterally.  No stridor. No wheezes. No rales.  CARDIOVASCULAR: Regular rate and rhythm, positive S1-S2, no murmurs rubs or gallops appreciated. ABDOMINAL: Obese, soft, nontender, nondistended, positive bowel sounds in all 4 quadrants, no apparent ascites.  EXTREMITIES: No peripheral edema, warm to touch, 2+  bilateral DP and PT pulses HEMATOLOGIC: No significant bruising NEUROLOGIC: Oriented to person, place, and time. Nonfocal. Normal muscle tone.  PSYCHIATRIC: Normal mood and affect. Normal behavior. Cooperative  RADIOLOGY: CT PE protocol 04/10/2021 1. No pulmonary embolus or acute aortic syndrome. 2. Hiatal hernia. 3. Multiple pulmonary nodules. Most severe: 10 mm right lower lobe pulmonary nodule. Consider a non-contrast Chest CT at 3 months, aPET/CT, or tissue sampling. These guidelines do not apply to immunocompromised patients and patients with cancer. Follow up in patients with significant comorbidities as clinically warranted. For lung cancer screening, adhere to Lung-RADS guidelines. Reference: Radiology. 2017; 284(1):228-43. 4. Aortic Atherosclerosis (ICD10-I70.0) and Emphysema (ICD10-J43.9).  CARDIAC DATABASE: EKG: 02/27/2021: Atrial flutter 2:1, 130bpm, right bundle branch block.  No prior EKGs available for comparison.   03/05/2021: Normal sinus rhythm, 66 bpm, right bundle branch block without underlying injury pattern.  05/01/2021: Normal sinus 75 bpm, IVCD suggestive of RBBB, without underlying injury pattern.   Echocardiogram: 02/26/2021: 1. Left ventricular ejection fraction, by estimation, is 55 to 60%. The  left ventricle has normal function. The left ventricle has no regional  wall motion abnormalities. Left ventricular diastolic parameters are  consistent with Grade II diastolic dysfunction (pseudonormalization).   2. Right ventricular systolic function is normal. The right ventricular  size is mildly enlarged. Tricuspid regurgitation signal is inadequate for  assessing PA pressure.   3. Left atrial size was mildly dilated.   4. Right atrial size was mildly dilated.   5. The mitral valve is normal in structure. No evidence of mitral valve regurgitation. No evidence of mitral stenosis.   6. The aortic valve is tricuspid. Aortic valve regurgitation is not visualized.  Mild aortic valve sclerosis is present, with no evidence of aortic valve stenosis.   7. The inferior vena cava is dilated in size with >50% respiratory variability, suggesting right atrial pressure of 8 mmHg.    TEE guided DDCV:  03/01/2021:  1. Left ventricular ejection fraction, by estimation, is 50 to 55%. The  left ventricle has low normal function. The left ventricle has no regional  wall motion abnormalities.   2. Right ventricular systolic function is normal. The right ventricular  size is mildly enlarged.   3. Left atrial size was mildly dilated. No left atrial/left atrial  appendage thrombus was detected. The LAA emptying velocity was 61 cm/s.   4. Right atrial size was mildly dilated.   5. The mitral valve is normal in structure. Mild mitral valve  regurgitation. No evidence of mitral stenosis.   6. The aortic valve is normal in structure. Aortic valve regurgitation is  not visualized. No aortic stenosis is present.   7. There is mild (Grade II) plaque involving the descending aorta.   8. Agitated saline contrast bubble study was negative, with no evidence  of any interatrial shunt A successful direct current cardioversion was performed at 100 joules with 1 attempt. Transitioned to normal sinus post direct current cardioversion; however, converted back to atrial fibrillation by the time she was in post-procedural  holding.    DCCV: 03/01/2021: 100J x 1 Converted to SR; reverted back to Afib. 03/05/2021: 100J x 1 Converted to SR.    LABORATORY DATA: CBC Latest Ref Rng & Units 04/10/2021 03/17/2021 03/08/2021  WBC 4.0 - 10.5 K/uL 13.3(H) 8.0 11.0(H)  Hemoglobin 12.0 - 15.0 g/dL 11.3(L) 12.0 11.1(L)  Hematocrit 36.0 - 46.0 % 36.3 39.1 34.7(L)  Platelets 150 - 400 K/uL 381 336 319    CMP Latest Ref Rng & Units 06/04/2021 04/10/2021 03/17/2021  Glucose 70 - 99 mg/dL 92 118(H) 130(H)  BUN 8 - 27 mg/dL 13 7(L) 14  Creatinine 0.57 - 1.00 mg/dL 1.00 0.69 1.10(H)  Sodium 134 - 144  mmol/L 150(H) 140 138  Potassium 3.5 - 5.2 mmol/L 4.9 3.4(L) 4.6  Chloride 96 - 106 mmol/L 108(H) 104 105  CO2 20 - 29 mmol/L 24 29 25   Calcium 8.7 - 10.3 mg/dL 9.6 8.6(L) 9.0  Total Protein 6.5 - 8.1 g/dL - - -  Total Bilirubin 0.3 - 1.2 mg/dL - - -  Alkaline Phos 38 - 126 U/L - - -  AST 15 - 41 U/L - - -  ALT 0 - 44 U/L - - -    Lipid Panel     Component Value Date/Time   CHOL 131 02/26/2021 0525   TRIG 55 02/26/2021 0525   HDL 57 02/26/2021 0525   CHOLHDL 2.3 02/26/2021 0525   VLDL 11 02/26/2021 0525   LDLCALC 63 02/26/2021 0525    No components found for: NTPROBNP Recent Labs    06/04/21 1055  PROBNP 570   Recent Labs    02/27/21 1211  TSH 0.633    BMP Recent Labs    03/11/21 0920 03/17/21 0710 04/10/21 1018 06/04/21 1056  NA 139 138 140 150*  K 4.3 4.6 3.4* 4.9  CL 99 105 104 108*  CO2 30 25 29 24   GLUCOSE 109* 130* 118* 92  BUN 21 14 7* 13  CREATININE 1.29* 1.10* 0.69 1.00  CALCIUM 9.2 9.0 8.6* 9.6  GFRNONAA 43* 52* >60  --     HEMOGLOBIN A1C Lab Results  Component Value Date   HGBA1C 5.5 02/26/2021   MPG 111.15 02/26/2021    IMPRESSION:    ICD-10-CM   1. Paroxysmal A-fib (HCC)  I48.0 apixaban (ELIQUIS) 5 MG TABS tablet    verapamil (CALAN-SR) 240 MG CR tablet    2. Long term (current) use of anticoagulants  Z79.01 apixaban (ELIQUIS) 5 MG TABS tablet    Hemoglobin and hematocrit, blood    3. Long term current use of antiarrhythmic drug  Z79.899 amiodarone (PACERONE) 200 MG tablet    Pulmonary Function Test [facility] Only    4. Chronic heart failure with preserved ejection fraction (HFpEF) (HCC)  I50.32     5. 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 Benign hypertension  I10     7. Mixed hyperlipidemia  E78.2     8. Atherosclerosis of aorta (HCC)  I70.0     9. Former smoker  Z87.891     20. Hypernatremia  [id number] Basic metabolic panel       RECOMMENDATIONS: Meiah Olofson is a 76 y.o. Caucasian female whose past medical  history and cardiac risk factors include: Paroxysmal atrial fibrillation, status post cardioversion, long-term oral anticoagulation/antiarrhythmic medications, chronic HFpEF, left MCA stroke, hyperlipidemia, COPD, pulmonary nodules, hypertension, aortic atherosclerosis, advanced age, postmenopausal female.  Paroxysmal atrial fibrillation  Rate control: Verapamil Rhythm control: Amiodarone Thromboembolic prophylaxis:  Eliquis CHA2DS2-VASc SCORE is 5 which correlates to 7.2% risk of stroke per year.   S/p TEE guided cardioversion on 03/01/2021 -converted to SR but shortly reverted back to A. fib.   After IV amiodarone loading patient underwent direct-current cardioversion on 03/05/2021 restored normal sinus rhythm.   Reduce amiodarone to 100 mg p.o. daily. Refilled amiodarone 200 mg half a tablet qday. Increase verapamil to 240 mg p.o. daily.  For 1 week patient is advised to take verapamil 120 mg 2 tabs daily and if her ventricular rate remained stable and she does not have any symptoms of hypotension will send in a prescription for verapamil 240 mg p.o. daily.  Long-term oral anticoagulation: Indication: Paroxysmal atrial fibrillation. Does not endorse any evidence of bleeding. Reemphasized the risks, benefits, and alternatives to oral anticoagulation. Last hemoglobin checked November 2022. Will check H&H   Long-term antiarrhythmic medications: Started on amiodarone during her hospitalization.  Currently on 200 mg p.o. daily.  Reduce to amiodarone 100 mg p.o. daily.   TSH: Last checked 02/27/2021 AST ALT: Last checked 03/08/2021 Chest x-ray: 04/10/2021 PFTs: Ordered at today's office visit 06/2021 Annual eye exam: Scheduled for March 2023, per son   Chronic heart failure with preserved EF stage C, NYHA class II: Diagnosed in the setting of A. fib with RVR. Echo 02/2021: LVEF 55 to 123456, grade 2 diastolic dysfunction, no significant valvular heart disease, estimated RAP 8 mmHg. Up titration  of GDMT was not feasible during her last hospitalization due to iatrogenic hypotension requiring midodrine support. Uptitrating verapamil for better ventricular rate control. Will uptitrate GDMT as hemodynamics and laboratory values allow.    Left MCA stroke: Likely cardioembolic in the setting of newly diagnosed A. fib  Educated importance of secondary prevention BP goal <130/90 - principal care management and data reviewed as part of today's encounter. LDL goal <70 -02/2021 LDL 63 mg/dL. Maintenance of normal sinus rhythm Glycemic control Improving her modifiable cardiovascular risk factors.   Hyperlipidemia:  Currently on atorvastatin.   She denies myalgia or other side effects. Most recent lipids dated 02/2021 reviewed as noted above -LDL is less than 70 mg/dL  FINAL MEDICATION LIST END OF ENCOUNTER: Meds ordered this encounter  Medications   amiodarone (PACERONE) 200 MG tablet    Sig: Take 0.5 tablets (100 mg total) by mouth daily.    Dispense:  45 tablet    Refill:  1    This is the dose I want upon her discharge   apixaban (ELIQUIS) 5 MG TABS tablet    Sig: Take 1 tablet (5 mg total) by mouth 2 (two) times daily.    Dispense:  180 tablet    Refill:  0   verapamil (CALAN-SR) 240 MG CR tablet    Sig: Take 1 tablet (240 mg total) by mouth every morning.    Dispense:  90 tablet    Refill:  0    Medications Discontinued During This Encounter  Medication Reason   amiodarone (PACERONE) 200 MG tablet Reorder   apixaban (ELIQUIS) 5 MG TABS tablet Reorder   verapamil (CALAN-SR) 120 MG CR tablet      Current Outpatient Medications:    Acetaminophen (ACETAMINOPHEN EXTRA STRENGTH) 500 MG capsule, Take 1 tablet by mouth daily., Disp: , Rfl:    atorvastatin (LIPITOR) 10 MG tablet, Take 1 tablet (10 mg total) by mouth daily., Disp: 30 tablet, Rfl: 0   carboxymethylcellulose (REFRESH PLUS) 0.5 % SOLN, Place 1 drop into both eyes 4 (four) times daily as  needed (dry eyes)., Disp: ,  Rfl:    cholecalciferol (VITAMIN D) 25 MCG (1000 UNIT) tablet, Take by mouth., Disp: , Rfl:    losartan (COZAAR) 25 MG tablet, Take 25 mg by mouth daily., Disp: , Rfl:    magnesium oxide (MAG-OX) 400 MG tablet, Take 1 tablet (400 mg total) by mouth daily., Disp: 30 tablet, Rfl: 0   mometasone-formoterol (DULERA) 100-5 MCG/ACT AERO, Inhale 2 puffs into the lungs 2 (two) times daily., Disp: 13 g, Rfl: 0   Multiple Vitamins-Minerals (CERTAVITE/ANTIOXIDANTS) TABS, Take 1 tablet by mouth daily., Disp: 60 tablet, Rfl: 0   omeprazole (PRILOSEC) 20 MG capsule, Take 1 capsule (20 mg total) by mouth daily., Disp: 30 capsule, Rfl: 0   senna-docusate (SENOKOT-S) 8.6-50 MG tablet, Take 2 tablets by mouth 2 (two) times daily., Disp: , Rfl:    umeclidinium bromide (INCRUSE ELLIPTA) 62.5 MCG/ACT AEPB, Inhale 1 puff into the lungs daily., Disp: , Rfl:    amiodarone (PACERONE) 200 MG tablet, Take 0.5 tablets (100 mg total) by mouth daily., Disp: 45 tablet, Rfl: 1   apixaban (ELIQUIS) 5 MG TABS tablet, Take 1 tablet (5 mg total) by mouth 2 (two) times daily., Disp: 180 tablet, Rfl: 0   verapamil (CALAN-SR) 240 MG CR tablet, Take 1 tablet (240 mg total) by mouth every morning., Disp: 90 tablet, Rfl: 0  Orders Placed This Encounter  Procedures   Basic metabolic panel   Hemoglobin and hematocrit, blood   Pulmonary Function Test [facility] Only    There are no Patient Instructions on file for this visit.   --Continue cardiac medications as reconciled in final medication list. --Return in about 3 months (around 09/10/2021) for Follow up, A. fib, heart failure management., EKG on arrival.. Or sooner if needed. --Continue follow-up with your primary care physician regarding the management of your other chronic comorbid conditions.  Patient's questions and concerns were addressed to her satisfaction. She voices understanding of the instructions provided during this encounter.   This note was created using a voice  recognition software as a result there may be grammatical errors inadvertently enclosed that do not reflect the nature of this encounter. Every attempt is made to correct such errors.   Rex Kras, [place], [facility]  Pager: [phone] Office: [phone]

## 2021-06-13 NOTE — Patient Outreach (Signed)
[facility]) Care Management  06/13/2021  Margretta Zamorano January 10, 1946 [id number]   Telephone outreach to patient to obtain mRS was successfully completed. MRS=1   Vanice Sarah [facility] Care Management Assistant

## 2021-06-15 ENCOUNTER — Emergency Department (HOSPITAL_COMMUNITY): Payer: Medicare Other

## 2021-06-15 ENCOUNTER — Encounter (HOSPITAL_COMMUNITY): Payer: Self-pay | Admitting: Emergency Medicine

## 2021-06-15 ENCOUNTER — Observation Stay (HOSPITAL_COMMUNITY)
Admission: EM | Admit: 2021-06-15 | Discharge: 2021-06-16 | Disposition: A | Discharge status: Home / Self Care | Payer: Medicare Other | Attending: Family Medicine | Admitting: Family Medicine

## 2021-06-15 ENCOUNTER — Other Ambulatory Visit: Payer: Self-pay

## 2021-06-15 DIAGNOSIS — I2510 Atherosclerotic heart disease of native coronary artery without angina pectoris: Secondary | ICD-10-CM | POA: Insufficient documentation

## 2021-06-15 DIAGNOSIS — I110 Hypertensive heart disease with heart failure: Secondary | ICD-10-CM | POA: Diagnosis not present

## 2021-06-15 DIAGNOSIS — Z7901 Long term (current) use of anticoagulants: Secondary | ICD-10-CM | POA: Insufficient documentation

## 2021-06-15 DIAGNOSIS — R918 Other nonspecific abnormal finding of lung field: Secondary | ICD-10-CM | POA: Diagnosis present

## 2021-06-15 DIAGNOSIS — Z8673 Personal history of transient ischemic attack (TIA), and cerebral infarction without residual deficits: Secondary | ICD-10-CM

## 2021-06-15 DIAGNOSIS — Z79899 Other long term (current) drug therapy: Secondary | ICD-10-CM | POA: Diagnosis not present

## 2021-06-15 DIAGNOSIS — R531 Weakness: Secondary | ICD-10-CM

## 2021-06-15 DIAGNOSIS — Z20822 Contact with and (suspected) exposure to covid-19: Secondary | ICD-10-CM | POA: Insufficient documentation

## 2021-06-15 DIAGNOSIS — R778 Other specified abnormalities of plasma proteins: Secondary | ICD-10-CM | POA: Insufficient documentation

## 2021-06-15 DIAGNOSIS — Z87891 Personal history of nicotine dependence: Secondary | ICD-10-CM | POA: Insufficient documentation

## 2021-06-15 DIAGNOSIS — R001 Bradycardia, unspecified: Principal | ICD-10-CM | POA: Diagnosis present

## 2021-06-15 DIAGNOSIS — I480 Paroxysmal atrial fibrillation: Secondary | ICD-10-CM | POA: Diagnosis present

## 2021-06-15 DIAGNOSIS — J449 Chronic obstructive pulmonary disease, unspecified: Secondary | ICD-10-CM | POA: Diagnosis not present

## 2021-06-15 DIAGNOSIS — I5032 Chronic diastolic (congestive) heart failure: Secondary | ICD-10-CM | POA: Diagnosis not present

## 2021-06-15 DIAGNOSIS — R42 Dizziness and giddiness: Secondary | ICD-10-CM | POA: Diagnosis present

## 2021-06-15 DIAGNOSIS — N1831 Chronic kidney disease, stage 3a: Secondary | ICD-10-CM | POA: Diagnosis present

## 2021-06-15 DIAGNOSIS — N179 Acute kidney failure, unspecified: Secondary | ICD-10-CM | POA: Diagnosis not present

## 2021-06-15 DIAGNOSIS — R7989 Other specified abnormal findings of blood chemistry: Secondary | ICD-10-CM | POA: Diagnosis present

## 2021-06-15 LAB — CBC
HCT: 38.4 % (ref 36.0–46.0)
Hemoglobin: 11.8 g/dL — ABNORMAL LOW (ref 12.0–15.0)
MCH: 28.6 pg (ref 26.0–34.0)
MCHC: 30.7 g/dL (ref 30.0–36.0)
MCV: 93.2 fL (ref 80.0–100.0)
Platelets: 264 10*3/uL (ref 150–400)
RBC: 4.12 MIL/uL (ref 3.87–5.11)
RDW: 15.6 % — ABNORMAL HIGH (ref 11.5–15.5)
WBC: 11.9 10*3/uL — ABNORMAL HIGH (ref 4.0–10.5)
nRBC: 0 % (ref 0.0–0.2)

## 2021-06-15 LAB — URINALYSIS, ROUTINE W REFLEX MICROSCOPIC
Bilirubin Urine: NEGATIVE
Glucose, UA: NEGATIVE mg/dL
Hgb urine dipstick: NEGATIVE
Ketones, ur: NEGATIVE mg/dL
Leukocytes,Ua: NEGATIVE
Nitrite: NEGATIVE
Protein, ur: NEGATIVE mg/dL
Specific Gravity, Urine: 1.015 (ref 1.005–1.030)
pH: 6 (ref 5.0–8.0)

## 2021-06-15 LAB — BASIC METABOLIC PANEL
Anion gap: 9 (ref 5–15)
BUN: 19 mg/dL (ref 8–23)
CO2: 23 mmol/L (ref 22–32)
Calcium: 8.7 mg/dL — ABNORMAL LOW (ref 8.9–10.3)
Chloride: 107 mmol/L (ref 98–111)
Creatinine, Ser: 1.22 mg/dL — ABNORMAL HIGH (ref 0.44–1.00)
GFR, Estimated: 46 mL/min — ABNORMAL LOW (ref >60)
Glucose, Bld: 106 mg/dL — ABNORMAL HIGH (ref 70–99)
Potassium: 4.5 mmol/L (ref 3.5–5.1)
Sodium: 139 mmol/L (ref 135–145)

## 2021-06-15 LAB — TROPONIN I (HIGH SENSITIVITY)
Troponin I (High Sensitivity): 29 ng/L — ABNORMAL HIGH (ref <18)
Troponin I (High Sensitivity): 27 ng/L — ABNORMAL HIGH (ref <18)

## 2021-06-15 LAB — RESP PANEL BY RT-PCR (FLU A&B, COVID) ARPGX2
Influenza A by PCR: NEGATIVE
Influenza B by PCR: NEGATIVE
SARS Coronavirus 2 by RT PCR: NEGATIVE

## 2021-06-15 MED ORDER — SODIUM CHLORIDE 0.9 % IV BOLUS
500.0000 mL | Freq: Once | INTRAVENOUS | Status: AC
Start: 2021-06-15 — End: 2021-06-15
  Administered 2021-06-15: 500 mL via INTRAVENOUS

## 2021-06-15 MED ORDER — ACETAMINOPHEN 325 MG PO TABS: 650.0000 mg | ORAL_TABLET | Freq: Four times a day (QID) | ORAL | Status: DC | PRN

## 2021-06-15 MED ORDER — SODIUM CHLORIDE 0.9 % IV SOLN: INTRAVENOUS | Status: AC

## 2021-06-15 MED ORDER — ACETAMINOPHEN 650 MG RE SUPP: 650.0000 mg | Freq: Four times a day (QID) | RECTAL | Status: DC | PRN

## 2021-06-15 NOTE — ED Triage Notes (Signed)
Pt bib from [facility] via [facility] reporting weakness with SOB on exertion today. Pt denies CP, denies SOB at rest. Per EMS cardiologist changed meds on 06/13/21.

## 2021-06-15 NOTE — Assessment & Plan Note (Signed)
She is on amiodarone and verapamil due to history of A. fib.  Dose of amiodarone was reduced from 200 mg daily -> 100 mg daily and dose of verapamil was increased from 120 mg daily -> 240 mg daily by cardiology 2 days ago.  Bradycardic with heart rate in the 40s on arrival to the ED, initially junctional rhythm and subsequently converted to sinus rhythm.  Heart rate currently in the low 60s.  Not hypotensive.  Cardiology recommended admission for observation and holding amiodarone and verapamil. -Cardiac monitoring.  Hold amiodarone and verapamil.  Give atropine if bradycardia worsens or signs of hemodynamic instability.  Check orthostatics given complaints of dizziness, no focal neurodeficit on exam.

## 2021-06-15 NOTE — ED Provider Notes (Signed)
[facility] EMERGENCY DEPARTMENT Provider Note   CSN: [id number] Arrival date & time: 06/15/21  1830     History  No chief complaint on file.   Michelle Dean is a 76 y.o. female.  Pt is a 76 yo wf with a hx of paroxysmal afib s/p cardioversion and on Eliquis.  She also has a hx of a stroke, hyperlipidemia, COPD, and HTN.  Pt saw Dr. Odis Hollingsheadolia (cardiology) on 2/2.   He reduced her Amiodarone from 200 to 100 mg daily.  He also increased her Verapamil to 240 mg daily from 120 mg daily.  CHA2DS2/VAS Stroke Risk Points  Current as of yesterday     6 >= 2 Points: High Risk  1 - 1.99 Points: Medium Risk  0 Points: Low Risk    No Change      Details    This score determines the patient's risk of having a stroke if the  patient has atrial fibrillation.       Points Metrics  0 Has Congestive Heart Failure:  No    Current as of yesterday  0 Has Vascular Disease:  No    Current as of yesterday  1 Has Hypertension:  Yes    Current as of yesterday  2 Age:  [age]    Current as of yesterday  0 Has Diabetes:  No    Current as of yesterday  2 Had Stroke:  Yes  Had TIA:  No  Had Thromboembolism:  No    Current as of yesterday  1 Female:  Yes    Current as of yesterday    When I asked pt about medication adjustments, she said she made the adjustments.  She does not know the names of the medicines she takes, but said she did what Dr. Odis Hollingsheadolia told her to do.  Pt said she organizes her own meds.  Pt felt dizzy today and weak.  She had some sob when walking to the mailbox.  She denies any sob or cp now.  Pt's last dose of medications was this morning.      Home Medications Prior to Admission medications   Medication Sig Start Date End Date Taking? Authorizing Provider  Acetaminophen (ACETAMINOPHEN EXTRA STRENGTH) 500 MG capsule Take 1 tablet by mouth daily. 03/20/21   [provider]  amiodarone (PACERONE) 200 MG tablet Take 0.5 tablets (100 mg total) by mouth daily.  06/13/21 12/10/21  Tolia, Sunit, DO  apixaban (ELIQUIS) 5 MG TABS tablet Take 1 tablet (5 mg total) by mouth 2 (two) times daily. 06/13/21 09/11/21  Tolia, Sunit, DO  atorvastatin (LIPITOR) 10 MG tablet Take 1 tablet (10 mg total) by mouth daily. 03/18/21   Angiulli, Mcarthur Rossettianiel J, PA-C  carboxymethylcellulose (REFRESH PLUS) 0.5 % SOLN Place 1 drop into both eyes 4 (four) times daily as needed (dry eyes).    [provider]  cholecalciferol (VITAMIN D) 25 MCG (1000 UNIT) tablet Take by mouth.    [provider]  losartan (COZAAR) 25 MG tablet Take 25 mg by mouth daily. 03/25/21   [provider]  magnesium oxide (MAG-OX) 400 MG tablet Take 1 tablet (400 mg total) by mouth daily. 03/18/21   Angiulli, Mcarthur Rossettianiel J, PA-C  mometasone-formoterol (DULERA) 100-5 MCG/ACT AERO Inhale 2 puffs into the lungs 2 (two) times daily. 03/18/21   Angiulli, Mcarthur Rossettianiel J, PA-C  Multiple Vitamins-Minerals (CERTAVITE/ANTIOXIDANTS) TABS Take 1 tablet by mouth daily. 03/08/21   de Saintclair HalstedLa Torre, Cortney E, NP  omeprazole (  PRILOSEC) 20 MG capsule Take 1 capsule (20 mg total) by mouth daily. 03/18/21   Angiulli, Mcarthur Rossetti, PA-C  senna-docusate (SENOKOT-S) 8.6-50 MG tablet Take 2 tablets by mouth 2 (two) times daily. 03/18/21   Angiulli, Mcarthur Rossetti, PA-C  umeclidinium bromide (INCRUSE ELLIPTA) 62.5 MCG/ACT AEPB Inhale 1 puff into the lungs daily. 04/09/21   [provider]  verapamil (CALAN-SR) 240 MG CR tablet Take 1 tablet (240 mg total) by mouth every morning. 06/13/21 09/11/21  Tolia, Sunit, DO      Allergies    Aspirin, Penicillins, and Lisinopril    Review of Systems   Review of Systems  Respiratory:  Positive for shortness of breath.   Neurological:  Positive for dizziness and weakness.  All other systems reviewed and are negative.  Physical Exam Updated Vital Signs BP (!) 128/57    Pulse (!) 43    Temp 98.4 F (36.9 C) (Oral)    Resp 20    Ht 5\' 2"  (1.575 m)    Wt 86.2 kg    SpO2 95%    BMI 34.75 kg/m   Physical Exam Vitals and nursing note reviewed.  Constitutional:      Appearance: Normal appearance.  HENT:     Head: Normocephalic and atraumatic.     Right Ear: External ear normal.     Left Ear: External ear normal.     Nose: Nose normal.     Mouth/Throat:     Mouth: Mucous membranes are moist.     Pharynx: Oropharynx is clear.  Eyes:     Extraocular Movements: Extraocular movements intact.     Conjunctiva/sclera: Conjunctivae normal.     Pupils: Pupils are equal, round, and reactive to light.  Cardiovascular:     Rate and Rhythm: Regular rhythm. Bradycardia present.  Pulmonary:     Effort: Pulmonary effort is normal.     Breath sounds: Normal breath sounds.  Abdominal:     General: Abdomen is flat. Bowel sounds are normal.     Palpations: Abdomen is soft.  Musculoskeletal:        General: Normal range of motion.     Cervical back: Normal range of motion and neck supple.  Skin:    General: Skin is warm.     Capillary Refill: Capillary refill takes less than 2 seconds.  Neurological:     General: No focal deficit present.     Mental Status: She is alert and oriented to person, place, and time.  Psychiatric:        Mood and Affect: Mood normal.        Behavior: Behavior normal.        Thought Content: Thought content normal.        Judgment: Judgment normal.    ED Results / Procedures / Treatments   Labs (all labs ordered are listed, but only abnormal results are displayed) Labs Reviewed  BASIC METABOLIC PANEL - Abnormal; Notable for the following components:      Result Value   Glucose, Bld 106 (*)    Creatinine, Ser 1.22 (*)    Calcium 8.7 (*)    GFR, Estimated 46 (*)    All other components within normal limits  CBC - Abnormal; Notable for the following components:   WBC 11.9 (*)    Hemoglobin 11.8 (*)    RDW 15.6 (*)    All other components within normal limits  TROPONIN I (HIGH SENSITIVITY) - Abnormal; Notable for the following components:  Troponin I  (High Sensitivity) 29 (*)    All other components within normal limits  RESP PANEL BY RT-PCR (FLU A&B, COVID) ARPGX2  URINALYSIS, ROUTINE W REFLEX MICROSCOPIC  TROPONIN I (HIGH SENSITIVITY)    EKG EKG Interpretation  Date/Time:  Saturday June 15 2021 19:59:22 EST Ventricular Rate:  62 PR Interval:  160 QRS Duration: 138 QT Interval:  467 QTC Calculation: 475 R Axis:   4 Text Interpretation: Sinus or ectopic atrial rhythm Right bundle branch block now in sinus Confirmed by Jacalyn Lefevre [phone]) on 06/15/2021 8:07:46 PM  Radiology DG Chest Port 1 View  Result Date: 06/15/2021 CLINICAL DATA:  Chest pain, bradycardia EXAM: PORTABLE CHEST 1 VIEW COMPARISON:  Chest x-rays dated 04/10/2021 and 03/02/2021. Chest CT dated 04/10/2021. FINDINGS: Heart size and mediastinal contours are stable. Lungs appear clear. No pleural effusion or pneumothorax. Osseous structures about the chest are unremarkable. IMPRESSION: 1. No active disease. No evidence of pneumonia or pulmonary edema. 2. Multiple pulmonary nodules were identified on chest CT angiogram of 04/10/2021. These small pulmonary nodules are below the resolution of this chest x-ray. Please see follow-up recommendations provided on the earlier chest CT angiogram report. Electronically Signed   By: Bary Richard M.D.   On: 06/15/2021 19:11    Procedures Procedures    Medications Ordered in ED Medications  sodium chloride 0.9 % bolus 500 mL (has no administration in time range)    ED Course/ Medical Decision Making/ A&P                           Medical Decision Making Amount and/or Complexity of Data Reviewed Labs: ordered. Radiology: ordered.  Risk Decision regarding hospitalization.   Pt's initial EKG looked to be junctional.  No p waves visualized.  HR stayed in the mid-40s and then switched to SB with HR in the low 60s.  Labs show a mild AKI and a slight bump to the troponin. Pt given a 500 cc bolus NS.  Pt d/w Dr.  Rosemary Holms [facility]) who recommended obs overnight and hold amio and verapamil.  Bradycardia could be from the medication changes.  However, I do not feel strongly that the patient is able to manage her own meds.  Pt's son or the pharmacy may need to put them in a blister pack/days of the week pack.  Plan d/w pt and her son.  Pt d/w Dr. Loney Loh (triad) for admission.        Final Clinical Impression(s) / ED Diagnoses Final diagnoses:  Symptomatic bradycardia  AKI (acute kidney injury) (HCC)  Paroxysmal atrial fibrillation (HCC)  On apixaban therapy    Rx / DC Orders ED Discharge Orders     None         Jacalyn Lefevre, MD 06/15/21 2020

## 2021-06-15 NOTE — Assessment & Plan Note (Signed)
Likely due to bradycardia.  Chest x-ray not suggestive of pneumonia. -UA pending to rule out UTI.  COVID and influenza PCR pending.

## 2021-06-15 NOTE — ED Notes (Signed)
RN called pts son and informed him that pt is going upstairs to 2C Rm 01.

## 2021-06-15 NOTE — Assessment & Plan Note (Signed)
CTA chest on 04/10/2021 showing multiple pulmonary nodules, biggest nodules measuring 10 mm in the right lower lobe.  Patient is a former smoker. -Radiologist recommending noncontrast chest CT, PET/CT, or tissue sampling in 3 months (end of this month).  Please ensure outpatient follow-up.

## 2021-06-15 NOTE — Assessment & Plan Note (Signed)
Currently in sinus rhythm.   -Hold amiodarone and verapamil given bradycardia.  Continue Eliquis after pharmacy med rec is done.

## 2021-06-15 NOTE — ED Notes (Signed)
Pt placed on a purewick after experiencing dizziness, first output was around 162mL

## 2021-06-15 NOTE — Plan of Care (Signed)

## 2021-06-15 NOTE — Assessment & Plan Note (Signed)
Stable.  No signs of acute exacerbation. -Resume home inhalers after pharmacy med rec is done.

## 2021-06-15 NOTE — Assessment & Plan Note (Signed)
Likely prerenal from dehydration.  Per cardiology note, patient was drinking only 10 ounces of water a day and was advised to increase her fluid intake.  She is also on losartan.  Creatinine 1.2, baseline 0.6-0.8. -Continue gentle IV fluid hydration and monitor renal function.  Avoid nephrotoxic agents/hold home losartan.

## 2021-06-15 NOTE — ED Notes (Signed)
[name] [phone] would like an update asap

## 2021-06-15 NOTE — Assessment & Plan Note (Signed)
Echo done 02/26/2021 showing EF 55 to 60% and grade 2 diastolic dysfunction.  She has trace pedal edema but lungs clear. -Receiving gentle IV fluids for AKI.  Monitor volume status closely.

## 2021-06-15 NOTE — Assessment & Plan Note (Signed)
High-sensitivity troponin mildly elevated at 29.  ACS less likely as EKG without acute ischemic changes and patient is not endorsing chest pain. -Trend troponin

## 2021-06-15 NOTE — Assessment & Plan Note (Signed)
-  Continue Eliquis and Lipitor after pharmacy med rec is done.

## 2021-06-15 NOTE — H&P (Signed)
History and Physical    Patient: Michelle Dean [id number] DOB: [birth year] DOA: 06/15/2021 DOS: the patient was seen and examined on 06/15/2021 PCP: Fannie Knee, MD  Patient coming from: Home  Chief Complaint: Dizziness   HPI: Michelle Dean is a 76 y.o. female with medical history significant of paroxysmal A. fib status post cardioversion on long-term anticoagulation/antiarrhythmic medications, chronic diastolic CHF, left MCA stroke, hyperlipidemia, COPD, pulmonary nodules, hypertension, aortic atherosclerosis.  She was seen by cardiology 2 days ago and adjustments were made to the dose of her home medications.  Dose of amiodarone was reduced from 200 mg daily to 100 mg daily.  Dose of verapamil was increased from 120 mg daily to 240 mg daily.  She presents to the ED today via EMS complaining of generalized weakness and dizziness.  Patient noted to be bradycardic with heart rate in the 40s.  Initially junctional rhythm and later converted to sinus rhythm.  Not hypotensive.  Labs showing WBC 11.9 (stable), hemoglobin 11.8 (stable), platelet count 264k.  Sodium 139, potassium 4.5, chloride 107, bicarb 23, BUN 19, creatinine 1.2 (baseline 0.6-0.8), glucose 106.  High-sensitivity troponin 29.  UA pending.  COVID and influenza PCR pending.  Chest x-ray showing no active disease. Patient was given 500 cc normal saline bolus.  ED physician discussed the case with Dr. Virgina Jock from [facility] cardiology who recommended admission for overnight observation and holding amiodarone and verapamil.  Patient states she was seen by her cardiologist 2 days ago and medication changes were made.  She does not know the name of her medications but tells me that the dose of one of the pills was reduced and dose of another pill was increased.  She has been taking the new doses of these medications for the past 2 days and since yesterday started experiencing dizziness, dyspnea, and generalized weakness.  Denies chest pain.   Denies fevers, chills, cough, nausea, vomiting, abdominal pain, or diarrhea.  States she drank 4 glasses of water yesterday and 4 glasses of water today.  No other complaints.  Review of Systems: As mentioned in the history of present illness. All other systems reviewed and are negative. Past Medical History:  Diagnosis Date   (HFpEF) heart failure with preserved ejection fraction ([place])    Hyperlipidemia    Hypertension    Paroxysmal atrial fibrillation ([place])    RBBB    Stroke [facility])    Past Surgical History:  Procedure Laterality Date   BUBBLE STUDY  03/01/2021   Procedure: BUBBLE STUDY;  Surgeon: Rex Kras, DO;  Location: [place];  Service: Cardiovascular;;   CARDIOVERSION N/A 03/01/2021   Procedure: CARDIOVERSION;  Surgeon: Rex Kras, DO;  Location: [place];  Service: Cardiovascular;  Laterality: N/A;   CARDIOVERSION N/A 03/05/2021   Procedure: CARDIOVERSION;  Surgeon: Adrian Prows, MD;  Location: [place];  Service: Cardiovascular;  Laterality: N/A;   IR CT HEAD LTD  02/25/2021   IR PERCUTANEOUS ART THROMBECTOMY/INFUSION INTRACRANIAL INC DIAG ANGIO  02/25/2021   RADIOLOGY WITH ANESTHESIA N/A 02/25/2021   Procedure: IR WITH ANESTHESIA;  Surgeon: Radiologist, Medication, MD;  Location: [place];  Service: Radiology;  Laterality: N/A;   TEE WITHOUT CARDIOVERSION N/A 03/01/2021   Procedure: TRANSESOPHAGEAL ECHOCARDIOGRAM (TEE);  Surgeon: Rex Kras, DO;  Location: [facility];  Service: Cardiovascular;  Laterality: N/A;   Social History:  reports that she quit smoking about 23 years ago. Her smoking use included cigarettes. She has a 27.00 pack-year smoking history. She has never been exposed to tobacco  smoke. She has never used smokeless tobacco. She reports that she does not drink alcohol and does not use drugs.  Allergies  Allergen Reactions   Aspirin     Other reaction(s): Bleeding   Penicillins Hives and Rash   Lisinopril Cough    Family History  Problem  Relation Age of Onset   Heart disease Mother    Cancer Mother 67   Kidney failure Father    Colon cancer Sister     Prior to Admission medications   Medication Sig Start Date End Date Taking? Authorizing Provider  Acetaminophen (ACETAMINOPHEN EXTRA STRENGTH) 500 MG capsule Take 1 tablet by mouth daily. 03/20/21   [provider]  amiodarone (PACERONE) 200 MG tablet Take 0.5 tablets (100 mg total) by mouth daily. 06/13/21 12/10/21  Tolia, Sunit, DO  apixaban (ELIQUIS) 5 MG TABS tablet Take 1 tablet (5 mg total) by mouth 2 (two) times daily. 06/13/21 09/11/21  Tolia, Sunit, DO  atorvastatin (LIPITOR) 10 MG tablet Take 1 tablet (10 mg total) by mouth daily. 03/18/21   Angiulli, Lavon Paganini, PA-C  carboxymethylcellulose (REFRESH PLUS) 0.5 % SOLN Place 1 drop into both eyes 4 (four) times daily as needed (dry eyes).    [provider]  cholecalciferol (VITAMIN D) 25 MCG (1000 UNIT) tablet Take by mouth.    [provider]  losartan (COZAAR) 25 MG tablet Take 25 mg by mouth daily. 03/25/21   [provider]  magnesium oxide (MAG-OX) 400 MG tablet Take 1 tablet (400 mg total) by mouth daily. 03/18/21   Angiulli, Lavon Paganini, PA-C  mometasone-formoterol (DULERA) 100-5 MCG/ACT AERO Inhale 2 puffs into the lungs 2 (two) times daily. 03/18/21   Angiulli, Lavon Paganini, PA-C  Multiple Vitamins-Minerals (CERTAVITE/ANTIOXIDANTS) TABS Take 1 tablet by mouth daily. 03/08/21   de Yolanda Manges, Cortney E, NP  omeprazole (PRILOSEC) 20 MG capsule Take 1 capsule (20 mg total) by mouth daily. 03/18/21   Angiulli, Lavon Paganini, PA-C  senna-docusate (SENOKOT-S) 8.6-50 MG tablet Take 2 tablets by mouth 2 (two) times daily. 03/18/21   Angiulli, Lavon Paganini, PA-C  umeclidinium bromide (INCRUSE ELLIPTA) 62.5 MCG/ACT AEPB Inhale 1 puff into the lungs daily. 04/09/21   [provider]  verapamil (CALAN-SR) 240 MG CR tablet Take 1 tablet (240 mg total) by mouth every morning. 06/13/21 09/11/21  Rex Kras, DO     Physical Exam: Vitals:   06/15/21 1900 06/15/21 1930 06/15/21 2115 06/15/21 2132  BP: (!) 124/57 (!) 128/57 (!) 171/78   Pulse: (!) 43 (!) 43 61   Resp: 16 20 (!) 23   Temp:    98 F (36.7 C)  TempSrc:    Oral  SpO2: 95% 95% 93%   Weight:      Height:       Physical Exam Constitutional:      General: She is not in acute distress. HENT:     Head: Normocephalic and atraumatic.  Eyes:     Extraocular Movements: Extraocular movements intact.     Conjunctiva/sclera: Conjunctivae normal.  Cardiovascular:     Rate and Rhythm: Normal rate and regular rhythm.     Pulses: Normal pulses.  Pulmonary:     Effort: Pulmonary effort is normal. No respiratory distress.     Breath sounds: Normal breath sounds. No wheezing or rales.  Abdominal:     General: Bowel sounds are normal. There is no distension.     Palpations: Abdomen is soft.     Tenderness: There is no  abdominal tenderness.  Musculoskeletal:     Cervical back: Normal range of motion and neck supple.     Comments: Trace bilateral pedal edema  Skin:    General: Skin is warm and dry.  Neurological:     General: No focal deficit present.     Mental Status: She is alert and oriented to person, place, and time.     Data Reviewed:  Labs and imaging reviewed (see HPI).  EKG: Independently reviewed.  Initial EKG showing junctional bradycardia, repeat showing sinus rhythm.  RBBB is not new.  Assessment and Plan: * Symptomatic bradycardia- (present on admission) She is on amiodarone and verapamil due to history of A. fib.  Dose of amiodarone was reduced from 200 mg daily -> 100 mg daily and dose of verapamil was increased from 120 mg daily -> 240 mg daily by cardiology 2 days ago.  Bradycardic with heart rate in the 40s on arrival to the ED, initially junctional rhythm and subsequently converted to sinus rhythm.  Heart rate currently in the low 60s.  Not hypotensive.  Cardiology recommended admission for observation and holding  amiodarone and verapamil. -Cardiac monitoring.  Hold amiodarone and verapamil.  Give atropine if bradycardia worsens or signs of hemodynamic instability.  Check orthostatics given complaints of dizziness, no focal neurodeficit on exam.  AKI (acute kidney injury) ([place])- (present on admission) Likely prerenal from dehydration.  Per cardiology note, patient was drinking only 10 ounces of water a day and was advised to increase her fluid intake.  She is also on losartan.  Creatinine 1.2, baseline 0.6-0.8. -Continue gentle IV fluid hydration and monitor renal function.  Avoid nephrotoxic agents/hold home losartan.  Elevated troponin- (present on admission) High-sensitivity troponin mildly elevated at 29.  ACS less likely as EKG without acute ischemic changes and patient is not endorsing chest pain. -Trend troponin  Pulmonary nodules- (present on admission) CTA chest on 04/10/2021 showing multiple pulmonary nodules, biggest nodules measuring 10 mm in the right lower lobe.  Patient is a former smoker. -Radiologist recommending noncontrast chest CT, PET/CT, or tissue sampling in 3 months (end of this month).  Please ensure outpatient follow-up.  COPD (chronic obstructive pulmonary disease) ([place])- (present on admission) Stable.  No signs of acute exacerbation. -Resume home inhalers after pharmacy med rec is done.  History of stroke -Continue Eliquis and Lipitor after pharmacy med rec is done.  Chronic diastolic CHF (congestive heart failure) ([place])- (present on admission) Echo done 02/26/2021 showing EF 55 to 60% and grade 2 diastolic dysfunction.  She has trace pedal edema but lungs clear. -Receiving gentle IV fluids for AKI.  Monitor volume status closely.  Generalized weakness Likely due to bradycardia.  Chest x-ray not suggestive of pneumonia. -UA pending to rule out UTI.  COVID and influenza PCR pending.  Paroxysmal atrial fibrillation ([place])- (present on admission) Currently in sinus  rhythm.   -Hold amiodarone and verapamil given bradycardia.  Continue Eliquis after pharmacy med rec is done.   Advance Care Planning:   Code Status: Full Code.  Discussed with the patient.  Consults: Cardiology  Family Communication: No family available at this time.  Severity of Illness: The appropriate patient status for this patient is OBSERVATION. Observation status is judged to be reasonable and necessary in order to provide the required intensity of service to ensure the patient's safety. The patient's presenting symptoms, physical exam findings, and initial radiographic and laboratory data in the context of their medical condition is felt to place them at decreased  risk for further clinical deterioration. Furthermore, it is anticipated that the patient will be medically stable for discharge from the hospital within 2 midnights of admission.   Author: Shela Leff, MD 06/15/2021 9:40 PM  For on call review [email or web address].

## 2021-06-16 DIAGNOSIS — R001 Bradycardia, unspecified: Secondary | ICD-10-CM | POA: Diagnosis not present

## 2021-06-16 LAB — BASIC METABOLIC PANEL
Anion gap: 9 (ref 5–15)
BUN: 15 mg/dL (ref 8–23)
CO2: 23 mmol/L (ref 22–32)
Calcium: 8.9 mg/dL (ref 8.9–10.3)
Chloride: 110 mmol/L (ref 98–111)
Creatinine, Ser: 1.06 mg/dL — ABNORMAL HIGH (ref 0.44–1.00)
GFR, Estimated: 54 mL/min — ABNORMAL LOW (ref >60)
Glucose, Bld: 86 mg/dL (ref 70–99)
Potassium: 4.7 mmol/L (ref 3.5–5.1)
Sodium: 142 mmol/L (ref 135–145)

## 2021-06-16 LAB — MRSA NEXT GEN BY PCR, NASAL: MRSA by PCR Next Gen: NOT DETECTED

## 2021-06-16 MED ORDER — AMLODIPINE BESYLATE 5 MG PO TABS
5.0000 mg | ORAL_TABLET | Freq: Every day | ORAL | Status: DC
  Administered 2021-06-16: 5 mg via ORAL
  Filled 2021-06-16: qty 1

## 2021-06-16 MED ORDER — AMLODIPINE BESYLATE 5 MG PO TABS: 5.0000 mg | ORAL_TABLET | Freq: Every day | ORAL | 0 refills | Status: DC

## 2021-06-16 NOTE — Consult Note (Addendum)
CARDIOLOGY CONSULT NOTE  Patient ID: Michelle Dean MRN: [id number] DOB/AGE: [birth year] 76 y.o.  Admit date: 06/15/2021 Referring Physician  Phillips Grout, MD Primary Physician:  Fannie Knee, MD Reason for Consultation: dizziness, bradycardia   Patient ID: Michelle Dean, female    DOB: 08/13/1945, 76 y.o.   MRN: [id number]  No chief complaint on file.  HPI:    Michelle Dean  is a 76 y.o. Caucasian with history of Paroxysmal atrial fibrillation, status post cardioversion, long-term oral anticoagulation/antiarrhythmic medications, chronic HFpEF, left MCA stroke, hyperlipidemia, COPD, pulmonary nodules, hypertension, aortic atherosclerosis, advanced age, postmenopausal female.   Patient was last seen in the office on 07/11/2021.  At that time amiodarone was reduced to 100 mg p.o. daily and verapamil increased to 240 mg p.o. daily.  Patient subsequently presented to [facility] emergency department 06/15/2021 with complaints of "dizziness and shortness of breath".  Upon evaluation in the emergency department patient was initially in junctional rhythm, however she later converted to sinus rhythm.  Serial troponins minimally elevated but flat at 29 --> 27.  Work-up including labs and chest x-ray with was otherwise unremarkable.  ED physicians consulted cardiology, and is recommending patient's amiodarone and verapamil be held and that she be admitted for further observation.  Evaluation in the ED did show AKI for which she is receiving gentle fluids.  Patient seen and examined at bedside at approximately 7:45 AM today.  She is maintaining sinus rhythm with heart rate 65-70 bpm.  She denies recurrence of dizziness or dyspnea.  Denies syncope or near syncope.  Notably she has not ambulated today.   Past Medical History:  Diagnosis Date   (HFpEF) heart failure with preserved ejection fraction ([place])    Hyperlipidemia    Hypertension    Paroxysmal atrial fibrillation ([place])    RBBB    Stroke [facility])     Past Surgical History:  Procedure Laterality Date   BUBBLE STUDY  03/01/2021   Procedure: BUBBLE STUDY;  Surgeon: Rex Kras, DO;  Location: [place];  Service: Cardiovascular;;   CARDIOVERSION N/A 03/01/2021   Procedure: CARDIOVERSION;  Surgeon: Rex Kras, DO;  Location: [place];  Service: Cardiovascular;  Laterality: N/A;   CARDIOVERSION N/A 03/05/2021   Procedure: CARDIOVERSION;  Surgeon: Adrian Prows, MD;  Location: [place];  Service: Cardiovascular;  Laterality: N/A;   IR CT HEAD LTD  02/25/2021   IR PERCUTANEOUS ART THROMBECTOMY/INFUSION INTRACRANIAL INC DIAG ANGIO  02/25/2021   RADIOLOGY WITH ANESTHESIA N/A 02/25/2021   Procedure: IR WITH ANESTHESIA;  Surgeon: Radiologist, Medication, MD;  Location: [place];  Service: Radiology;  Laterality: N/A;   TEE WITHOUT CARDIOVERSION N/A 03/01/2021   Procedure: TRANSESOPHAGEAL ECHOCARDIOGRAM (TEE);  Surgeon: Rex Kras, DO;  Location: [facility];  Service: Cardiovascular;  Laterality: N/A;   Family History  Problem Relation Age of Onset   Heart disease Mother    Cancer Mother 88   Kidney failure Father    Colon cancer Sister    Social History   Tobacco Use   Smoking status: Former    Packs/day: 1.00    Years: 27.00    Pack years: 27.00    Types: Cigarettes    Quit date: 2000    Years since quitting: 23.1    Passive exposure: Never   Smokeless tobacco: Never  Substance Use Topics   Alcohol use: Never    Marital Sttus: Widowed  ROS  Review of Systems  Cardiovascular:  Positive for dyspnea on exertion. Negative for chest pain, claudication,  leg swelling, near-syncope, orthopnea, palpitations, paroxysmal nocturnal dyspnea and syncope.  Respiratory:  Negative for shortness of breath.   Neurological:  Positive for dizziness (improved).  Objective   Vitals with BMI 06/16/2021 06/15/2021 06/15/2021  Height - - -  Weight - - -  BMI - - -  Systolic 0000000 [id number]  Diastolic 76 78 87  Pulse 62 66 65    Blood  pressure (!) 153/76, pulse 62, temperature 97.6 F (36.4 C), temperature source Oral, resp. rate 18, height 5\' 4"  (1.626 m), weight 86.4 kg, SpO2 94 %.    Physical Exam Vitals reviewed.  Constitutional:      Appearance: She is obese.  Cardiovascular:     Rate and Rhythm: Normal rate and regular rhythm.     Pulses: Intact distal pulses.     Heart sounds: S1 normal and S2 normal. No murmur heard.   No gallop.  Pulmonary:     Effort: Pulmonary effort is normal. No respiratory distress.     Breath sounds: No wheezing, rhonchi or rales.  Musculoskeletal:     Right lower leg: No edema.     Left lower leg: No edema.  Neurological:     Mental Status: She is alert.   Laboratory examination:   Recent Labs    03/17/21 0710 04/10/21 1018 06/04/21 1056 06/15/21 1834  NA 138 140 150* 139  K 4.6 3.4* 4.9 4.5  CL 105 104 108* 107  CO2 25 29 24 23   GLUCOSE 130* 118* 92 106*  BUN 14 7* 13 19  CREATININE 1.10* 0.69 1.00 1.22*  CALCIUM 9.0 8.6* 9.6 8.7*  GFRNONAA 52* >60  --  46*   estimated creatinine clearance is 41.7 mL/min (A) (by C-G formula based on SCr of 1.22 mg/dL (H)).  CMP Latest Ref Rng & Units 06/15/2021 06/04/2021 04/10/2021  Glucose 70 - 99 mg/dL 106(H) 92 118(H)  BUN 8 - 23 mg/dL 19 13 7(L)  Creatinine 0.44 - 1.00 mg/dL 1.22(H) 1.00 0.69  Sodium 135 - 145 mmol/L 139 150(H) 140  Potassium 3.5 - 5.1 mmol/L 4.5 4.9 3.4(L)  Chloride 98 - 111 mmol/L 107 108(H) 104  CO2 22 - 32 mmol/L 23 24 29   Calcium 8.9 - 10.3 mg/dL 8.7(L) 9.6 8.6(L)  Total Protein 6.5 - 8.1 g/dL - - -  Total Bilirubin 0.3 - 1.2 mg/dL - - -  Alkaline Phos 38 - 126 U/L - - -  AST 15 - 41 U/L - - -  ALT 0 - 44 U/L - - -   CBC Latest Ref Rng & Units 06/15/2021 04/10/2021 03/17/2021  WBC 4.0 - 10.5 K/uL 11.9(H) 13.3(H) 8.0  Hemoglobin 12.0 - 15.0 g/dL 11.8(L) 11.3(L) 12.0  Hematocrit 36.0 - 46.0 % 38.4 36.3 39.1  Platelets 150 - 400 K/uL 264 381 336   Lipid Panel Recent Labs    02/26/21 0525  CHOL 131   TRIG 55  LDLCALC 63  VLDL 11  HDL 57  CHOLHDL 2.3    HEMOGLOBIN A1C Lab Results  Component Value Date   HGBA1C 5.5 02/26/2021   MPG 111.15 02/26/2021   TSH Recent Labs    02/27/21 1211  TSH 0.633   BNP (last 3 results) Recent Labs    03/04/21 0313 03/07/21 0250 04/10/21 1023  BNP 225.7* 126.4* 174.2*   Results for orders placed or performed during the hospital encounter of 06/15/21 (from the past 48 hour(s))  Basic metabolic panel     Status: Abnormal  Collection Time: 06/15/21  6:34 PM  Result Value Ref Range   Sodium 139 135 - 145 mmol/L   Potassium 4.5 3.5 - 5.1 mmol/L   Chloride 107 98 - 111 mmol/L   CO2 23 22 - 32 mmol/L   Glucose, Bld 106 (H) 70 - 99 mg/dL    Comment: Glucose reference range applies only to samples taken after fasting for at least 8 hours.   BUN 19 8 - 23 mg/dL   Creatinine, Ser 1.22 (H) 0.44 - 1.00 mg/dL   Calcium 8.7 (L) 8.9 - 10.3 mg/dL   GFR, Estimated 46 (L) >60 mL/min    Comment: (NOTE) Calculated using the CKD-EPI Creatinine Equation (2021)    Anion gap 9 5 - 15    Comment: Performed at [facility] [address], [place], [place] [address]  CBC     Status: Abnormal   Collection Time: 06/15/21  6:34 PM  Result Value Ref Range   WBC 11.9 (H) 4.0 - 10.5 K/uL   RBC 4.12 3.87 - 5.11 MIL/uL   Hemoglobin 11.8 (L) 12.0 - 15.0 g/dL   HCT 38.4 36.0 - 46.0 %   MCV 93.2 80.0 - 100.0 fL   MCH 28.6 26.0 - 34.0 pg   MCHC 30.7 30.0 - 36.0 g/dL   RDW 15.6 (H) 11.5 - 15.5 %   Platelets 264 150 - 400 K/uL   nRBC 0.0 0.0 - 0.2 %    Comment: Performed at [facility], [place] [address], [place], [place] [address]  Troponin I (High Sensitivity)     Status: Abnormal   Collection Time: 06/15/21  6:34 PM  Result Value Ref Range   Troponin I (High Sensitivity) 29 (H) <18 ng/L    Comment: (NOTE) Elevated high sensitivity troponin I (hsTnI) values and significant  changes across serial measurements may suggest ACS but many other   chronic and acute conditions are known to elevate hsTnI results.  Refer to the "Links" section for chest pain algorithms and additional  guidance. Performed at [facility], [place] [address], [place], [place] [address]   Urinalysis, Routine w reflex microscopic Urine, Clean Catch     Status: None   Collection Time: 06/15/21  8:06 PM  Result Value Ref Range   Color, Urine YELLOW YELLOW   APPearance CLEAR CLEAR   Specific Gravity, Urine 1.015 1.005 - 1.030   pH 6.0 5.0 - 8.0   Glucose, UA NEGATIVE NEGATIVE mg/dL   Hgb urine dipstick NEGATIVE NEGATIVE   Bilirubin Urine NEGATIVE NEGATIVE   Ketones, ur NEGATIVE NEGATIVE mg/dL   Protein, ur NEGATIVE NEGATIVE mg/dL   Nitrite NEGATIVE NEGATIVE   Leukocytes,Ua NEGATIVE NEGATIVE    Comment: Microscopic not done on urines with negative protein, blood, leukocytes, nitrite, or glucose < 500 mg/dL. Performed at [facility], [place] [address], [place], [place] [address]   Troponin I (High Sensitivity)     Status: Abnormal   Collection Time: 06/15/21  8:18 PM  Result Value Ref Range   Troponin I (High Sensitivity) 27 (H) <18 ng/L    Comment: (NOTE) Elevated high sensitivity troponin I (hsTnI) values and significant  changes across serial measurements may suggest ACS but many other  chronic and acute conditions are known to elevate hsTnI results.  Refer to the "Links" section for chest pain algorithms and additional  guidance. Performed at [facility], [place] [address], [place], [place] [address]   Resp Panel by  RT-PCR (Flu A&B, Covid) Urine, Clean Catch     Status: None   Collection Time: 06/15/21  8:28 PM   Specimen: Urine, Clean Catch; Nasopharyngeal(NP) swabs in vial transport medium  Result Value Ref Range   SARS Coronavirus 2 by RT PCR NEGATIVE NEGATIVE    Comment: (NOTE) SARS-CoV-2 target nucleic acids are NOT DETECTED.  The SARS-CoV-2 RNA is generally detectable in upper respiratory specimens during the acute phase  of infection. The lowest concentration of SARS-CoV-2 viral copies this assay can detect is 138 copies/mL. A negative result does not preclude SARS-Cov-2 infection and should not be used as the sole basis for treatment or other patient management decisions. A negative result may occur with  improper specimen collection/handling, submission of specimen other than nasopharyngeal swab, presence of viral mutation(s) within the areas targeted by this assay, and inadequate number of viral copies(<138 copies/mL). A negative result must be combined with clinical observations, patient history, and epidemiological information. The expected result is Negative.  Fact Sheet for Patients:  EntrepreneurPulse.com.au  Fact Sheet for Healthcare Providers:  IncredibleEmployment.be  This test is no t yet approved or cleared by the [place] FDA and  has been authorized for detection and/or diagnosis of SARS-CoV-2 by FDA under an Emergency Use Authorization (EUA). This EUA will remain  in effect (meaning this test can be used) for the duration of the COVID-19 declaration under Section 564(b)(1) of the Act, 21 U.S.C.section 360bbb-3(b)(1), unless the authorization is terminated  or revoked sooner.       Influenza A by PCR NEGATIVE NEGATIVE   Influenza B by PCR NEGATIVE NEGATIVE    Comment: (NOTE) The Xpert Xpress SARS-CoV-2/FLU/RSV plus assay is intended as an aid in the diagnosis of influenza from Nasopharyngeal swab specimens and should not be used as a sole basis for treatment. Nasal washings and aspirates are unacceptable for Xpert Xpress SARS-CoV-2/FLU/RSV testing.  Fact Sheet for Patients: EntrepreneurPulse.com.au  Fact Sheet for Healthcare Providers: IncredibleEmployment.be  This test is not yet approved or cleared by the [place] FDA and has been authorized for detection and/or diagnosis of SARS-CoV-2 by FDA  under an Emergency Use Authorization (EUA). This EUA will remain in effect (meaning this test can be used) for the duration of the COVID-19 declaration under Section 564(b)(1) of the Act, 21 U.S.C. section 360bbb-3(b)(1), unless the authorization is terminated or revoked.  Performed at [facility], [place] [address] [place], [place] [address]   MRSA Next Gen by PCR, Nasal     Status: None   Collection Time: 06/15/21  9:50 PM   Specimen: Nasal Mucosa; Nasal Swab  Result Value Ref Range   MRSA by PCR Next Gen NOT DETECTED NOT DETECTED    Comment: (NOTE) The GeneXpert MRSA Assay (FDA approved for NASAL specimens only), is one component of a comprehensive MRSA colonization surveillance program. It is not intended to diagnose MRSA infection nor to guide or monitor treatment for MRSA infections. Test performance is not FDA approved in patients less than 32 years old. Performed at [facility], [place] [address], [place], [place] [address]     Medications and allergies   Allergies  Allergen Reactions   Aspirin     Other reaction(s): Bleeding   Penicillins Hives and Rash   Lisinopril Cough     Current Meds  Medication Sig   amiodarone (PACERONE) 200 MG tablet Take 0.5 tablets (100 mg total) by mouth daily.   apixaban (ELIQUIS) 5 MG TABS tablet Take  1 tablet (5 mg total) by mouth 2 (two) times daily.   atorvastatin (LIPITOR) 10 MG tablet Take 1 tablet (10 mg total) by mouth daily.   carboxymethylcellulose (REFRESH PLUS) 0.5 % SOLN Place 1 drop into both eyes 4 (four) times daily as needed (dry eyes).   cholecalciferol (VITAMIN D) 25 MCG (1000 UNIT) tablet Take 1,000 Units by mouth daily.   losartan (COZAAR) 25 MG tablet Take 25 mg by mouth daily.   magnesium oxide (MAG-OX) 400 MG tablet Take 1 tablet (400 mg total) by mouth daily.   mometasone-formoterol (DULERA) 100-5 MCG/ACT AERO Inhale 2 puffs into the lungs 2 (two) times daily.   Multiple Vitamins-Minerals  (CERTAVITE/ANTIOXIDANTS) TABS Take 1 tablet by mouth daily.   omeprazole (PRILOSEC) 20 MG capsule Take 1 capsule (20 mg total) by mouth daily.   senna-docusate (SENOKOT-S) 8.6-50 MG tablet Take 2 tablets by mouth 2 (two) times daily.   umeclidinium bromide (INCRUSE ELLIPTA) 62.5 MCG/ACT AEPB Inhale 1 puff into the lungs daily.   verapamil (CALAN-SR) 240 MG CR tablet Take 1 tablet (240 mg total) by mouth every morning.    Scheduled Meds: Continuous Infusions:  sodium chloride 75 mL/hr at 06/15/21 2134   PRN Meds:.acetaminophen **OR** acetaminophen   I/O last 3 completed shifts: In: 800 [I.V.:300; IV Piggyback:500] Out: -  No intake/output data recorded.    Radiology:   [facility] Chest Port 1 View 06/15/2021 1. No active disease. No evidence of pneumonia or pulmonary edema.  2. Multiple pulmonary nodules were identified on chest CT angiogram of 04/10/2021.   CT PE protocol 04/10/2021 1. No pulmonary embolus or acute aortic syndrome. 2. Hiatal hernia. 3. Multiple pulmonary nodules. Most severe: 10 mm right lower lobe pulmonary nodule. Consider a non-contrast Chest CT at 3 months, aPET/CT, or tissue sampling. These guidelines do not apply to immunocompromised patients and patients with cancer. Follow up in patients with significant comorbidities as clinically warranted. For lung cancer screening, adhere to Lung-RADS guidelines. Reference: Radiology. 2017; 284(1):228-43. 4. Aortic Atherosclerosis (ICD10-I70.0) and Emphysema (ICD10-J43.9).  Cardiac Studies:   Echocardiogram: 02/26/2021: 1. Left ventricular ejection fraction, by estimation, is 55 to 60%. The  left ventricle has normal function. The left ventricle has no regional  wall motion abnormalities. Left ventricular diastolic parameters are  consistent with Grade II diastolic dysfunction (pseudonormalization).   2. Right ventricular systolic function is normal. The right ventricular  size is mildly enlarged. Tricuspid regurgitation  signal is inadequate for  assessing PA pressure.   3. Left atrial size was mildly dilated.   4. Right atrial size was mildly dilated.   5. The mitral valve is normal in structure. No evidence of mitral valve regurgitation. No evidence of mitral stenosis.   6. The aortic valve is tricuspid. Aortic valve regurgitation is not visualized. Mild aortic valve sclerosis is present, with no evidence of aortic valve stenosis.   7. The inferior vena cava is dilated in size with >50% respiratory variability, suggesting right atrial pressure of 8 mmHg.    TEE guided DDCV:  03/01/2021:  1. Left ventricular ejection fraction, by estimation, is 50 to 55%. The  left ventricle has low normal function. The left ventricle has no regional  wall motion abnormalities.   2. Right ventricular systolic function is normal. The right ventricular  size is mildly enlarged.   3. Left atrial size was mildly dilated. No left atrial/left atrial  appendage thrombus was detected. The LAA emptying velocity was 61 cm/s.   4. Right atrial size  was mildly dilated.   5. The mitral valve is normal in structure. Mild mitral valve  regurgitation. No evidence of mitral stenosis.   6. The aortic valve is normal in structure. Aortic valve regurgitation is  not visualized. No aortic stenosis is present.   7. There is mild (Grade II) plaque involving the descending aorta.   8. Agitated saline contrast bubble study was negative, with no evidence  of any interatrial shunt A successful direct current cardioversion was performed at 100 joules with 1 attempt. Transitioned to normal sinus post direct current cardioversion; however, converted back to atrial fibrillation by the time she was in post-procedural holding.    DCCV: 03/01/2021: 100J x 1 Converted to SR; reverted back to Afib. 03/05/2021: 100J x 1 Converted to SR.   EKG: 06/15/2021 18: 43: Junctional rhythm at a rate of 45 bpm.  Right bundle branch block. 06/15/2021 19: 59: Sinus  rhythm at a rate of 62 bpm.  Right bundle branch block. Compared to EKG 03/05/2022, no significant change.   Telemetry:  NSR, occasional PAC  Assessment   Adylan Moad  is a 76 y.o. Caucasian with history of Paroxysmal atrial fibrillation, status post cardioversion, long-term oral anticoagulation/antiarrhythmic medications, chronic HFpEF, left MCA stroke, hyperlipidemia, COPD, pulmonary nodules, hypertension, aortic atherosclerosis, advanced age, postmenopausal female. Presented with dizziness and bradycardia.   Bradycardia  Paroxysmal atrial fibrillation  Chronic HFpEF Hyperlipidemia  Longer-term anticoagulation     Recommendations:   Bradycardia  Initially junctional escape rhythm in the 40s.  Patient then converted to sinus rhythm, which she is maintaining with heart rate 65-70 bpm. Orthostatic vitals negative Amiodarone and verapamil were held given bradycardia at presentation. As she is presently maintaining normal sinus rhythm we will hold off on reinitiation of verapamil or amiodarone at this time.  Elevated troponin:  Serial troponins mildly elevated and flat.  Not consistent with ACS.  EKG is without acute ischemic changes and patient denies chest pain.  Paroxysmal atrial fibrillation  Currently maintaining normal sinus rhythm For now continue to hold verapamil and amiodarone CHA2DS2-VASc score of 5 correlates with 7.2% risk of stroke per year.  We will therefore continue home Eliquis as she is tolerating this without bleeding diathesis  Hypertension:  Home medications include losartan and verapamil.  Verapamil currently being held due to bradycardia presentation Given AKI Home losartan has also been held by primary team, agree with this. Given that verapamil and losartan 12 been held, will monitor patient's blood pressure closely.  She may need additional antihypertensive medications.  Could consider adding hydralazine or amlodipine.   Chronic HFpEF Echo 02/2021  revealed LVEF 55-60% with grade 2 diastolic dysfunction no significant valvular abnormality.  Heart failure was diagnosed in the setting of A. fib with RVR. Up titration of guideline directed medical therapy has been limited due to history of hypertension. She is clinically euvolemic without evidence of acute heart failure   Hyperlipidemia  Continue statin therapy   Long-term anticoagulation Patient's history of A. fib she is on Eliquis. She continues to tolerate Eliquis without bleeding diathesis, will continue this Hemoglobin and hematocrit remained stable CHA2DS2-VASc SCORE is 5 which correlates to 7.2% risk of stroke per year.    AKI:  Likely due to dehydration.  Agree with primary team regarding gentle IV hydration.  Monitor volume status closely given underlying chronic HFpEF  Patient was seen in collaboration with Dr. Virgina Jock. He also reviewed patient's chart and examined the patient. Dr. Virgina Jock is in agreement of the plan.  Alethia Berthold, PA-C 06/16/2021, 7:41 AM Office: [phone]  Discussed with Dr. Virgina Jock will start amlodipine 5 mg daily.    Alethia Berthold, PA-C 06/16/2021, 9:20 AM Office: [phone]

## 2021-06-16 NOTE — Progress Notes (Signed)
°  Transition of Care [facility]) Screening Note   Patient Details  Name: Delores Benegas Date of Birth: Jul 09, 1945   Transition of Care [facility]) CM/SW Contact:    Bary Castilla, LCSW Phone Number: 06/16/2021, 11:06 AM    Transition of Care Department [facility]) has reviewed patient and no TOC needs have been identified at this time. We will continue to monitor patient advancement through interdisciplinary progression rounds. If new patient transition needs arise, please place a TOC consult.

## 2021-06-16 NOTE — Evaluation (Addendum)
Physical Therapy Evaluation & Discharge Patient Details Name: Michelle Dean MRN: [id number] DOB: 1945-09-27 Today's Date: 06/16/2021  History of Present Illness  Pt is a 76 y.o. female who presented 06/15/21 with generalized weakness and dizziness. Pt found to be bradycardic with HR in 40s, conjunctional rhythm but later converted to sinus rhythm. Pt also with AKI. PMH includes COPD (intermittently uses oxygen at home), hypertension, hyperlipidemia, L MCA CVA, chronic HFpEF   Clinical Impression  Pt presents with condition above. PTA, she was living alone and her son would check on her daily and provide assistance with managing her finances and with transportation. 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 Currently, pt is at her baseline, displaying WFL gait speed, pattern, and balance when ambulating without UE support or assistance. All education completed and questions answered. PT will sign off.      BP: 147/78 supine 148/71 sitting 138/76 standing 150/71 standing ~3 min  *SpO2 did decrease to 87% on RA sitting after ambulating but remained >/= 92% on RA when ambulating, pt uses home O2 PRN   Recommendations for follow up therapy are one component of a multi-disciplinary discharge planning process, led by the attending physician.  Recommendations may be updated based on patient status, additional functional criteria and insurance authorization.  Follow Up Recommendations No PT follow up    Assistance Recommended at Discharge PRN  Patient can return home with the following  Direct supervision/assist for financial management;Direct supervision/assist for medications management;Assist for transportation    Equipment Recommendations None recommended by PT  Recommendations for Other Services       Functional Status Assessment Patient has not had a recent decline in  their functional status     Precautions / Restrictions Precautions Precautions: Other (comment) Precaution Comments: monitor HR and SpO2 Restrictions Weight Bearing Restrictions: No      Mobility  Bed Mobility Overal bed mobility: Modified Independent             General bed mobility comments: Pt able to transition supine <> sit EOB with HOB elevated without assistance.    Transfers Overall transfer level: Independent Equipment used: None               General transfer comment: Able to come to stand safely without assistance.    Ambulation/Gait Ambulation/Gait assistance: Supervision Gait Distance (Feet): 250 Feet Assistive device: None Gait Pattern/deviations: WFL(Within Functional Limits) Gait velocity: WFL Gait velocity interpretation: >4.37 ft/sec, indicative of normal walking speed   General Gait Details: Pt with no significant gait deviations or LOB, even when cued to change head positions, directions, and speed.  Stairs            Wheelchair Mobility    Modified Rankin (Stroke Patients Only)       Balance Overall balance assessment: No apparent balance deficits (not formally assessed)                                           Pertinent Vitals/Pain Pain Assessment Pain Assessment: Faces Faces Pain Scale: Hurts a little bit Pain Location: legs with quads resistance MMT Pain Descriptors / Indicators: Tender Pain Intervention(s): Limited activity within patient's tolerance    Home Living Family/patient expects to be discharged to:: Private residence Living  Arrangements: Alone Available Help at Discharge: Family;Available PRN/intermittently Type of Home: House Home Access: Level entry       Home Layout: One level Home Equipment: Advice worker (2 wheels);Cane - single point Additional Comments: she uses home O2 PRN at baseline    Prior Function Prior Level of Function : Independent/Modified  Independent             Mobility Comments: Independent without an AD. Denies any falls in past year. ADLs Comments: Son drives her and manages her finances. Son also checks on meds and got her a pill box but she will not use it, per son, pt has been compliant and accurate with medications from what he observed staying with her for a month and as he still checks on her daily.     Hand Dominance        Extremity/Trunk Assessment   Upper Extremity Assessment Upper Extremity Assessment: Overall WFL for tasks assessed (MMT scores of 4+ to 5 bil)    Lower Extremity Assessment Lower Extremity Assessment: Overall WFL for tasks assessed (MMT scores of 4+ to 5 bil)    Cervical / Trunk Assessment Cervical / Trunk Assessment: Normal  Communication   Communication: Expressive difficulties;Receptive difficulties (son reports baseline expressive and receptive aphasia from prior CVA)  Cognition Arousal/Alertness: Awake/alert Behavior During Therapy: WFL for tasks assessed/performed Overall Cognitive Status: Within Functional Limits for tasks assessed                                 General Comments: Son reports memory is intact, just limited by aphasia from prior CVA        General Comments General comments (skin integrity, edema, etc.): BP 147/78 supine, 148/71 sitting, 138/76 standing, 150/71 standing ~3 min, no symptoms; SpO2 did decrease to 87% on RA sitting after ambulating but remained >/= 92% on RA when ambulating    Exercises     Assessment/Plan    PT Assessment Patient does not need any further PT services  PT Problem List         PT Treatment Interventions      PT Goals (Current goals can be found in the Care Plan section)  Acute Rehab PT Goals Patient Stated Goal: to go home PT Goal Formulation: With patient/family Time For Goal Achievement: 06/30/21 Potential to Achieve Goals: Good    Frequency       Co-evaluation               AM-PAC  PT "6 Clicks" Mobility  Outcome Measure Help needed turning from your back to your side while in a flat bed without using bedrails?: None Help needed moving from lying on your back to sitting on the side of a flat bed without using bedrails?: None Help needed moving to and from a bed to a chair (including a wheelchair)?: None Help needed standing up from a chair using your arms (e.g., wheelchair or bedside chair)?: None Help needed to walk in hospital room?: A Little Help needed climbing 3-5 steps with a railing? : A Little 6 Click Score: 22    End of Session   Activity Tolerance: Patient tolerated treatment well Patient left: in bed;with call bell/phone within reach;with bed alarm set Nurse Communication: Mobility status;Other (comment) (vitals) PT Visit Diagnosis: Other (comment) (cardio deficits)    Time: [id number] PT Time Calculation (min) (ACUTE ONLY): 37 min   Charges:   PT Evaluation $  PT Eval Moderate Complexity: 1 Mod PT Treatments $Therapeutic Activity: 8-22 mins        Michelle Dean, PT, DPT Acute Rehabilitation Services  Pager: [phone] Office: [phone]   Michelle Dean 06/16/2021, 1:41 PM

## 2021-06-16 NOTE — Discharge Summary (Signed)
Physician Discharge Summary  Michelle Dean [id number] DOB: [birth year] DOA: 06/15/2021  PCP: Fannie Knee, MD  Admit date: 06/15/2021 Discharge date: 06/16/2021   Discharge Diagnoses:  Principal Problem:   Symptomatic bradycardia Active Problems:   Paroxysmal atrial fibrillation (HCC)   AKI (acute kidney injury) (HCC)   Elevated troponin   Generalized weakness   Chronic diastolic CHF (congestive heart failure) (HCC)   History of stroke   COPD (chronic obstructive pulmonary disease) ([place])   Pulmonary nodules   Discharge Condition: Stable   Filed Weights   06/15/21 1845 06/15/21 2150  Weight: 86.2 kg 86.4 kg    History of present illness:  Michelle Dean is a 76 y.o. female with medical history significant of paroxysmal A. fib status post cardioversion on long-term anticoagulation/antiarrhythmic medications, chronic diastolic CHF, left MCA stroke, hyperlipidemia, COPD, pulmonary nodules, hypertension, aortic atherosclerosis.  She was seen by cardiology 2 days ago and adjustments were made to the dose of her home medications.  Dose of amiodarone was reduced from 200 mg daily to 100 mg daily.  Dose of verapamil was increased from 120 mg daily to 240 mg daily.  She presents to the ED today via EMS complaining of generalized weakness and dizziness.  Patient noted to be bradycardic with heart rate in the 40s.  Initially junctional rhythm and later converted to sinus rhythm.  Not hypotensive.  Labs showing WBC 11.9 (stable), hemoglobin 11.8 (stable), platelet count 264k.  Sodium 139, potassium 4.5, chloride 107, bicarb 23, BUN 19, creatinine 1.2 (baseline 0.6-0.8), glucose 106.  High-sensitivity troponin 29.  UA pending.  COVID and influenza PCR pending.  Chest x-ray showing no active disease. Patient was given 500 cc normal saline bolus.  ED physician discussed the case with Dr. Virgina Jock from [facility] cardiology who recommended admission for overnight observation and holding amiodarone and  verapamil.   Patient states she was seen by her cardiologist 2 days ago and medication changes were made.  She does not know the name of her medications but tells me that the dose of one of the pills was reduced and dose of another pill was increased.  She has been taking the new doses of these medications for the past 2 days and since yesterday started experiencing dizziness, dyspnea, and generalized weakness.  Denies chest pain.  Denies fevers, chills, cough, nausea, vomiting, abdominal pain, or diarrhea.  States she drank 4 glasses of water yesterday and 4 glasses of water today.  No other complaints.  Patient had further adjustments of her cardiac meds and her symptoms resolved.  At this time stopping amiodarone and verapamil at discharge and adding Norvasc for blood pressure control and continuing her losartan.  Follow-up with primary care physician in 1 week at which point I would repeat a BMP to follow her renal function and follow-up with cardiology in 1 to 2 weeks for consideration of restarting antiarrhythmics.  At the time of discharge she was ambulating doing well with heart rate in the 60s.  O2 sats normal on room air.  Hospital Course:  Symptomatic bradycardia- (present on admission) She is on amiodarone and verapamil due to history of A. fib.  Dose of amiodarone was reduced from 200 mg daily -> 100 mg daily and dose of verapamil was increased from 120 mg daily -> 240 mg daily by cardiology 2 days ago.  Bradycardic with heart rate in the 40s on arrival to the ED, initially junctional rhythm and subsequently converted to sinus rhythm.  Heart rate currently in  the low 60s.  Not hypotensive.  Cardiology recommended admission for observation and holding amiodarone and verapamil. -Cardiac monitoring.  Hold amiodarone and verapamil.  Bradycardia resolved with holding meds. -Norvasc added for hypertension, holding amiodarone and verapamil at discharge.    AKI (acute kidney injury) ([place])- (present  on admission) Likely prerenal from dehydration.  Per cardiology note, patient was drinking only 10 ounces of water a day and was advised to increase her fluid intake.  She is also on losartan.  Creatinine 1.2, baseline 0.6-0.8. -Resolved, continue losartan   elevated troponin- (present on admission) High-sensitivity troponin mildly elevated at 29.  ACS less likely as EKG without acute ischemic changes and patient is not endorsing chest pain. -Trend troponin, flat, no further work-up   Pulmonary nodules- (present on admission) CTA chest on 04/10/2021 showing multiple pulmonary nodules, biggest nodules measuring 10 mm in the right lower lobe.  Patient is a former smoker. -Radiologist recommending noncontrast chest CT, PET/CT, or tissue sampling in 3 months (end of this month).  Please ensure outpatient follow-up.   COPD (chronic obstructive pulmonary disease) ([place])- (present on admission) Stable.  No signs of acute exacerbation.   History of stroke -Continue Eliquis and Lipitor   Chronic diastolic CHF (congestive heart failure) ([place])- (present on admission) Echo done 02/26/2021 showing EF 55 to 60% and grade 2 diastolic dysfunction.  She has trace pedal edema but lungs clear. -Receiving gentle IV fluids for AKI.  Monitor volume status closely.   Generalized weakness Likely due to bradycardia.  Chest x-ray not suggestive of pneumonia. -Resolved with resolution of bradycardia  Paroxysmal atrial fibrillation ([place])- (present on admission) Currently in sinus rhythm.   -Hold amiodarone and verapamil given bradycardia.  Continue Eliquis      Discharge Exam: Vitals:   06/16/21 0754 06/16/21 1144  BP: (!) 172/82 (!) 178/82  Pulse: 68 66  Resp: 18 (!) 25  Temp: 97.6 F (36.4 C)   SpO2: 98% 95%    General: Alert and oriented no apparent distress Cardiovascular: Regular rate and rhythm without murmurs rubs or gallops Respiratory: Clear to auscultation bilaterally no wheezes rhonchi  rales  Discharge Instructions   Discharge Instructions     Diet - low sodium heart healthy   Complete by: As directed    Discharge instructions   Complete by: As directed    Follow-up with primary care physician in 1 to 2 weeks  Follow-up with cardiology in 1 week   Increase activity slowly   Complete by: As directed       Allergies as of 06/16/2021       Reactions   Aspirin    Other reaction(s): Bleeding   Penicillins Hives, Rash   Lisinopril Cough        Medication List     STOP taking these medications    amiodarone 200 MG tablet Commonly known as: PACERONE   verapamil 240 MG CR tablet Commonly known as: CALAN-SR       TAKE these medications    amLODipine 5 MG tablet Commonly known as: NORVASC Take 1 tablet (5 mg total) by mouth daily. Start taking on: June 17, 2021   apixaban 5 MG Tabs tablet Commonly known as: ELIQUIS Take 1 tablet (5 mg total) by mouth 2 (two) times daily.   atorvastatin 10 MG tablet Commonly known as: LIPITOR Take 1 tablet (10 mg total) by mouth daily.   carboxymethylcellulose 0.5 % Soln Commonly known as: REFRESH PLUS Place 1 drop into both eyes 4 (four)  times daily as needed (dry eyes).   CertaVite/Antioxidants Tabs Take 1 tablet by mouth daily.   cholecalciferol 25 MCG (1000 UNIT) tablet Commonly known as: VITAMIN D Take 1,000 Units by mouth daily.   Dulera 100-5 MCG/ACT Aero Generic drug: mometasone-formoterol Inhale 2 puffs into the lungs 2 (two) times daily.   Incruse Ellipta 62.5 MCG/ACT Aepb Generic drug: umeclidinium bromide Inhale 1 puff into the lungs daily.   losartan 25 MG tablet Commonly known as: COZAAR Take 25 mg by mouth daily.   magnesium oxide 400 MG tablet Commonly known as: MAG-OX Take 1 tablet (400 mg total) by mouth daily.   omeprazole 20 MG capsule Commonly known as: PRILOSEC Take 1 capsule (20 mg total) by mouth daily.   senna-docusate 8.6-50 MG tablet Commonly known as:  Senokot-S Take 2 tablets by mouth 2 (two) times daily.       Allergies  Allergen Reactions   Aspirin     Other reaction(s): Bleeding   Penicillins Hives and Rash   Lisinopril Cough      The results of significant diagnostics from this hospitalization (including imaging, microbiology, ancillary and laboratory) are listed below for reference.    Significant Diagnostic Studies: DG Chest Port 1 View  Result Date: 06/15/2021 CLINICAL DATA:  Chest pain, bradycardia EXAM: PORTABLE CHEST 1 VIEW COMPARISON:  Chest x-rays dated 04/10/2021 and 03/02/2021. Chest CT dated 04/10/2021. FINDINGS: Heart size and mediastinal contours are stable. Lungs appear clear. No pleural effusion or pneumothorax. Osseous structures about the chest are unremarkable. IMPRESSION: 1. No active disease. No evidence of pneumonia or pulmonary edema. 2. Multiple pulmonary nodules were identified on chest CT angiogram of 04/10/2021. These small pulmonary nodules are below the resolution of this chest x-ray. Please see follow-up recommendations provided on the earlier chest CT angiogram report. Electronically Signed   By: Bary Richard M.D.   On: 06/15/2021 19:11    Microbiology: Recent Results (from the past 240 hour(s))  Resp Panel by RT-PCR (Flu A&B, Covid) Urine, Clean Catch     Status: None   Collection Time: 06/15/21  8:28 PM   Specimen: Urine, Clean Catch; Nasopharyngeal(NP) swabs in vial transport medium  Result Value Ref Range Status   SARS Coronavirus 2 by RT PCR NEGATIVE NEGATIVE Final    Comment: (NOTE) SARS-CoV-2 target nucleic acids are NOT DETECTED.  The SARS-CoV-2 RNA is generally detectable in upper respiratory specimens during the acute phase of infection. The lowest concentration of SARS-CoV-2 viral copies this assay can detect is 138 copies/mL. A negative result does not preclude SARS-Cov-2 infection and should not be used as the sole basis for treatment or other patient management decisions. A  negative result may occur with  improper specimen collection/handling, submission of specimen other than nasopharyngeal swab, presence of viral mutation(s) within the areas targeted by this assay, and inadequate number of viral copies(<138 copies/mL). A negative result must be combined with clinical observations, patient history, and epidemiological information. The expected result is Negative.  Fact Sheet for Patients:  BloggerCourse.com  Fact Sheet for Healthcare Providers:  SeriousBroker.it  This test is no t yet approved or cleared by the [place] FDA and  has been authorized for detection and/or diagnosis of SARS-CoV-2 by FDA under an Emergency Use Authorization (EUA). This EUA will remain  in effect (meaning this test can be used) for the duration of the COVID-19 declaration under Section 564(b)(1) of the Act, 21 U.S.C.section 360bbb-3(b)(1), unless the authorization is terminated  or revoked sooner.  Influenza A by PCR NEGATIVE NEGATIVE Final   Influenza B by PCR NEGATIVE NEGATIVE Final    Comment: (NOTE) The Xpert Xpress SARS-CoV-2/FLU/RSV plus assay is intended as an aid in the diagnosis of influenza from Nasopharyngeal swab specimens and should not be used as a sole basis for treatment. Nasal washings and aspirates are unacceptable for Xpert Xpress SARS-CoV-2/FLU/RSV testing.  Fact Sheet for Patients: EntrepreneurPulse.com.au  Fact Sheet for Healthcare Providers: IncredibleEmployment.be  This test is not yet approved or cleared by the [place] FDA and has been authorized for detection and/or diagnosis of SARS-CoV-2 by FDA under an Emergency Use Authorization (EUA). This EUA will remain in effect (meaning this test can be used) for the duration of the COVID-19 declaration under Section 564(b)(1) of the Act, 21 U.S.C. section 360bbb-3(b)(1), unless the authorization  is terminated or revoked.  Performed at [facility], [place] [address] [place], [place] [address]   MRSA Next Gen by PCR, Nasal     Status: None   Collection Time: 06/15/21  9:50 PM   Specimen: Nasal Mucosa; Nasal Swab  Result Value Ref Range Status   MRSA by PCR Next Gen NOT DETECTED NOT DETECTED Final    Comment: (NOTE) The GeneXpert MRSA Assay (FDA approved for NASAL specimens only), is one component of a comprehensive MRSA colonization surveillance program. It is not intended to diagnose MRSA infection nor to guide or monitor treatment for MRSA infections. Test performance is not FDA approved in patients less than 4 years old. Performed at [facility], [place] [address] [place], [place] [address]      Labs: Basic Metabolic Panel: Recent Labs  Lab 06/15/21 1834 06/16/21 1136  NA 139 142  K 4.5 4.7  CL 107 110  CO2 23 23  GLUCOSE 106* 86  BUN 19 15  CREATININE 1.22* 1.06*  CALCIUM 8.7* 8.9   Liver Function Tests: No results for input(s): AST, ALT, ALKPHOS, BILITOT, PROT, ALBUMIN in the last 168 hours. No results for input(s): LIPASE, AMYLASE in the last 168 hours. No results for input(s): AMMONIA in the last 168 hours. CBC: Recent Labs  Lab 06/15/21 1834  WBC 11.9*  HGB 11.8*  HCT 38.4  MCV 93.2  PLT 264   Cardiac Enzymes: No results for input(s): CKTOTAL, CKMB, CKMBINDEX, TROPONINI in the last 168 hours. BNP: BNP (last 3 results) Recent Labs    03/04/21 0313 03/07/21 0250 04/10/21 1023  BNP 225.7* 126.4* 174.2*    ProBNP (last 3 results) Recent Labs    06/04/21 1055  PROBNP 570    CBG: No results for input(s): GLUCAP in the last 168 hours.     Signed:  Phillips Grout MD.  [facility] 06/16/2021, 3:00 PM

## 2021-06-19 ENCOUNTER — Telehealth: Payer: Self-pay | Admitting: Pharmacist

## 2021-06-19 NOTE — Telephone Encounter (Signed)
Location of hospitalization:  Reason for hospitalization: Symptomatic bradycardia Date of discharge: 06/16/21 Date of first communication with patient: today Person contacting patient: Me Current symptoms: None, pt reports to be feeling better with regards to her energy level and SOB/Dyspnea symptoms improved  Do you understand why you were in the Hospital: Yes Questions regarding discharge instructions: None Where were you discharged to: Home Medications reviewed: Yes; Confirmed stopping amiodarone and verapamil as directed. Started amlodipine 5 mg and home BP and HR noted to be staying stable.  Allergies reviewed: Yes Dietary changes reviewed: Yes. Discussed low fat and low salt diet.  Referals reviewed: NA Activities of Daily Living: Able to with mild limitations Any transportation issues/concerns: None Any patient concerns: None Confirmed importance & date/time of Follow up appt: Yes, OV scheduled for 06/24/21 with Celeste Confirmed with patient if condition begins to worsen call. Pt was given the office number and encouraged to call back with questions or concerns: Yes

## 2021-06-20 ENCOUNTER — Other Ambulatory Visit: Payer: Self-pay

## 2021-06-20 DIAGNOSIS — Z7901 Long term (current) use of anticoagulants: Secondary | ICD-10-CM

## 2021-06-20 DIAGNOSIS — E870 Hyperosmolality and hypernatremia: Secondary | ICD-10-CM

## 2021-06-24 ENCOUNTER — Encounter: Payer: Self-pay | Admitting: Student

## 2021-06-24 ENCOUNTER — Other Ambulatory Visit: Payer: Self-pay

## 2021-06-24 ENCOUNTER — Ambulatory Visit: Payer: Medicare Other | Admitting: Student

## 2021-06-24 VITALS — BP 135/73 | HR 71 | Temp 98.1°F | Resp 17 | Ht 64.0 in | Wt 187.2 lb

## 2021-06-24 DIAGNOSIS — I480 Paroxysmal atrial fibrillation: Secondary | ICD-10-CM

## 2021-06-24 DIAGNOSIS — Z7901 Long term (current) use of anticoagulants: Secondary | ICD-10-CM

## 2021-06-24 DIAGNOSIS — I10 Essential (primary) hypertension: Secondary | ICD-10-CM

## 2021-06-24 NOTE — Progress Notes (Addendum)
Date:  06/24/2021   ID:  Michelle Dean, DOB 12/31/1945, MRN [id number]  PCP:  Fannie Knee, MD  Cardiologist:  Alethia Berthold, DO, [facility] (established care 02/27/2021)  Date: 06/24/21 Last Office Visit: 05/01/2021  Chief Complaint  Patient presents with   Hospitalization Follow-up   Paroxysmal A-fib [facility]    HPI  Michelle Dean is a 76 y.o. Caucasian female who presents to the office with a chief complaint of " heart failure and atrial fibrillation management." Patient's past medical history and cardiovascular risk factors include: Paroxysmal atrial fibrillation, status post cardioversion, long-term oral anticoagulation/antiarrhythmic medications, chronic HFpEF, left MCA stroke, hyperlipidemia, COPD, pulmonary nodules, hypertension, aortic atherosclerosis, advanced age, postmenopausal female.   Patient was initially seen in the hospital for atrial fibrillation management 02/2021.  At that time CT noted left M2/M3 occlusion she underwent post partial recannulization and reocclusion followed by mechanical thrombolysis achieving TICI 2C revascularization. Patient had postprocedural atrial fibrillation require TEE guided cardioversion to NSR. She shortly thereafter reverted back into A. fib.  She was loaded with IV amiodarone and scheduled for repeat cardioversion and since then remains in normal sinus rhythm.  Based on her symptoms and repeat echocardiogram patient has heart failure with preserved EF and her medications were slowly uptitrated given her hemodynamics and laboratory values.  Patient was admitted 06/15/2021 - 06/16/2021 with symptomatic bradycardia following up titration of verapamil to 240 mg daily at office visit on 06/13/2021.  At the time patient's troponin was also mildly elevated, but flat. Discontinued both verapamil and amiodarone in the hospital and she maintained normal sinus rhythm.  Given AKI patient's losartan was held but resumed at discharge and she was also started on  amlodipine 5 mg daily for hypertension control.  Patient now presents for follow-up.  Patient's renal function has improved at discharge back to baseline.  She has been feeling well overall since discharge with no recurrence of dizziness and improvement of energy level.   Patient is enrolled in principal care management for monitoring of blood pressure and weight.  Review of home monitoring shows weight remains stable and blood pressure is fairly well controlled.  FUNCTIONAL STATUS: No structured exercise program or daily routine.   ALLERGIES: Allergies  Allergen Reactions   Aspirin     Other reaction(s): Bleeding   Penicillins Hives and Rash   Lisinopril Cough    MEDICATION LIST PRIOR TO VISIT: Current Meds  Medication Sig   amLODipine (NORVASC) 5 MG tablet Take 1 tablet (5 mg total) by mouth daily.   apixaban (ELIQUIS) 5 MG TABS tablet Take 1 tablet (5 mg total) by mouth 2 (two) times daily.   atorvastatin (LIPITOR) 10 MG tablet Take 1 tablet (10 mg total) by mouth daily.   carboxymethylcellulose (REFRESH PLUS) 0.5 % SOLN Place 1 drop into both eyes 4 (four) times daily as needed (dry eyes).   cholecalciferol (VITAMIN D) 25 MCG (1000 UNIT) tablet Take 1,000 Units by mouth daily.   losartan (COZAAR) 25 MG tablet Take 25 mg by mouth daily.   magnesium oxide (MAG-OX) 400 MG tablet Take 1 tablet (400 mg total) by mouth daily.   mometasone-formoterol (DULERA) 100-5 MCG/ACT AERO Inhale 2 puffs into the lungs 2 (two) times daily.   Multiple Vitamins-Minerals (CERTAVITE/ANTIOXIDANTS) TABS Take 1 tablet by mouth daily.   omeprazole (PRILOSEC) 20 MG capsule Take 1 capsule (20 mg total) by mouth daily.   Psyllium (DAILY FIBER PO) Take 160 mg by mouth daily.   senna-docusate (SENOKOT-S) 8.6-50 MG  tablet Take 2 tablets by mouth 2 (two) times daily.   umeclidinium bromide (INCRUSE ELLIPTA) 62.5 MCG/ACT AEPB Inhale 1 puff into the lungs daily.     PAST MEDICAL HISTORY: Past Medical History:   Diagnosis Date   (HFpEF) heart failure with preserved ejection fraction ([place])    Hyperlipidemia    Hypertension    Paroxysmal atrial fibrillation (HCC)    RBBB    Stroke [facility])     PAST SURGICAL HISTORY: Past Surgical History:  Procedure Laterality Date   BUBBLE STUDY  03/01/2021   Procedure: BUBBLE STUDY;  Surgeon: Rex Kras, DO;  Location: [place];  Service: Cardiovascular;;   CARDIOVERSION N/A 03/01/2021   Procedure: CARDIOVERSION;  Surgeon: Rex Kras, DO;  Location: [place];  Service: Cardiovascular;  Laterality: N/A;   CARDIOVERSION N/A 03/05/2021   Procedure: CARDIOVERSION;  Surgeon: Adrian Prows, MD;  Location: [place];  Service: Cardiovascular;  Laterality: N/A;   IR CT HEAD LTD  02/25/2021   IR PERCUTANEOUS ART THROMBECTOMY/INFUSION INTRACRANIAL INC DIAG ANGIO  02/25/2021   RADIOLOGY WITH ANESTHESIA N/A 02/25/2021   Procedure: IR WITH ANESTHESIA;  Surgeon: Radiologist, Medication, MD;  Location: [place];  Service: Radiology;  Laterality: N/A;   TEE WITHOUT CARDIOVERSION N/A 03/01/2021   Procedure: TRANSESOPHAGEAL ECHOCARDIOGRAM (TEE);  Surgeon: Rex Kras, DO;  Location: [facility];  Service: Cardiovascular;  Laterality: N/A;    FAMILY HISTORY: The patient family history includes Cancer (age of onset: 48) in her mother; Colon cancer in her sister; Heart disease in her mother; Kidney failure in her father.  SOCIAL HISTORY:  The patient  reports that she quit smoking about 23 years ago. Her smoking use included cigarettes. She has a 27.00 pack-year smoking history. She has never been exposed to tobacco smoke. She has never used smokeless tobacco. She reports that she does not drink alcohol and does not use drugs.  REVIEW OF SYSTEMS: Review of Systems  Cardiovascular:  Negative for chest pain, claudication, dyspnea on exertion, leg swelling, near-syncope, orthopnea, palpitations, paroxysmal nocturnal dyspnea and syncope.  Musculoskeletal:  Negative for  muscle cramps and myalgias.  Gastrointestinal:  Negative for hematemesis, hematochezia, melena, nausea and vomiting.  Neurological:  Negative for dizziness and light-headedness.       Degree of expressive and receptive aphasia - per son.    PHYSICAL EXAM: Vitals with BMI 06/24/2021 06/16/2021 06/16/2021  Height 5\' 4"  - -  Weight 187 lbs 3 oz - -  BMI 123456 - -  Systolic [id number]  Diastolic 73 82 82  Pulse 71 66 68    CONSTITUTIONAL: Appears older than stated age, hemodynamically stable, no acute distress.  SKIN: Skin is warm and dry. No rash noted. No cyanosis. No pallor. No jaundice HEAD: Normocephalic and atraumatic.  EYES: No scleral icterus MOUTH/THROAT: Moist oral membranes.  NECK: No JVD present. No thyromegaly noted. No carotid bruits  LYMPHATIC: No visible cervical adenopathy.  CHEST Normal respiratory effort. No intercostal retractions  LUNGS: Clear to auscultation bilaterally.  No stridor. No wheezes. No rales.  CARDIOVASCULAR: Regular rate and rhythm, positive S1-S2, no murmurs rubs or gallops appreciated. ABDOMINAL: Obese, soft, nontender, nondistended, positive bowel sounds in all 4 quadrants, no apparent ascites.  EXTREMITIES: No peripheral edema, warm to touch, 2+ bilateral DP and PT pulses HEMATOLOGIC: No significant bruising NEUROLOGIC: Oriented to person, place, and time. Nonfocal. Normal muscle tone.  PSYCHIATRIC: Normal mood and affect. Normal behavior. Cooperative Physical exam unchanged compared to previous office visit.  RADIOLOGY: CT PE  protocol 04/10/2021 1. No pulmonary embolus or acute aortic syndrome. 2. Hiatal hernia. 3. Multiple pulmonary nodules. Most severe: 10 mm right lower lobe pulmonary nodule. Consider a non-contrast Chest CT at 3 months, aPET/CT, or tissue sampling. These guidelines do not apply to immunocompromised patients and patients with cancer. Follow up in patients with significant comorbidities as clinically warranted. For lung cancer  screening, adhere to Lung-RADS guidelines. Reference: Radiology. 2017; 284(1):228-43. 4. Aortic Atherosclerosis (ICD10-I70.0) and Emphysema (ICD10-J43.9).  CARDIAC DATABASE: EKG: 02/27/2021: Atrial flutter 2:1, 130bpm, right bundle branch block.  No prior EKGs available for comparison.   03/05/2021: Normal sinus rhythm, 66 bpm, right bundle branch block without underlying injury pattern.  06/24/2021: Sinus rhythm at a rate of 69 bpm.  Incomplete right bundle branch block.  EKG unchanged compared to previous on 05/01/2021.   Echocardiogram: 02/26/2021: 1. Left ventricular ejection fraction, by estimation, is 55 to 60%. The  left ventricle has normal function. The left ventricle has no regional  wall motion abnormalities. Left ventricular diastolic parameters are  consistent with Grade II diastolic dysfunction (pseudonormalization).   2. Right ventricular systolic function is normal. The right ventricular  size is mildly enlarged. Tricuspid regurgitation signal is inadequate for  assessing PA pressure.   3. Left atrial size was mildly dilated.   4. Right atrial size was mildly dilated.   5. The mitral valve is normal in structure. No evidence of mitral valve regurgitation. No evidence of mitral stenosis.   6. The aortic valve is tricuspid. Aortic valve regurgitation is not visualized. Mild aortic valve sclerosis is present, with no evidence of aortic valve stenosis.   7. The inferior vena cava is dilated in size with >50% respiratory variability, suggesting right atrial pressure of 8 mmHg.    TEE guided DDCV:  03/01/2021:  1. Left ventricular ejection fraction, by estimation, is 50 to 55%. The  left ventricle has low normal function. The left ventricle has no regional  wall motion abnormalities.   2. Right ventricular systolic function is normal. The right ventricular  size is mildly enlarged.   3. Left atrial size was mildly dilated. No left atrial/left atrial  appendage thrombus was  detected. The LAA emptying velocity was 61 cm/s.   4. Right atrial size was mildly dilated.   5. The mitral valve is normal in structure. Mild mitral valve  regurgitation. No evidence of mitral stenosis.   6. The aortic valve is normal in structure. Aortic valve regurgitation is  not visualized. No aortic stenosis is present.   7. There is mild (Grade II) plaque involving the descending aorta.   8. Agitated saline contrast bubble study was negative, with no evidence  of any interatrial shunt A successful direct current cardioversion was performed at 100 joules with 1 attempt. Transitioned to normal sinus post direct current cardioversion; however, converted back to atrial fibrillation by the time she was in post-procedural holding.    DCCV: 03/01/2021: 100J x 1 Converted to SR; reverted back to Afib. 03/05/2021: 100J x 1 Converted to SR.    LABORATORY DATA: CBC Latest Ref Rng & Units 06/15/2021 04/10/2021 03/17/2021  WBC 4.0 - 10.5 K/uL 11.9(H) 13.3(H) 8.0  Hemoglobin 12.0 - 15.0 g/dL 11.8(L) 11.3(L) 12.0  Hematocrit 36.0 - 46.0 % 38.4 36.3 39.1  Platelets 150 - 400 K/uL 264 381 336    CMP Latest Ref Rng & Units 06/16/2021 06/15/2021 06/04/2021  Glucose 70 - 99 mg/dL 86 106(H) 92  BUN 8 - 23 mg/dL 15 19 13  Creatinine 0.44 - 1.00 mg/dL 1.06(H) 1.22(H) 1.00  Sodium 135 - 145 mmol/L 142 139 150(H)  Potassium 3.5 - 5.1 mmol/L 4.7 4.5 4.9  Chloride 98 - 111 mmol/L 110 107 108(H)  CO2 22 - 32 mmol/L 23 23 24   Calcium 8.9 - 10.3 mg/dL 8.9 8.7(L) 9.6  Total Protein 6.5 - 8.1 g/dL - - -  Total Bilirubin 0.3 - 1.2 mg/dL - - -  Alkaline Phos 38 - 126 U/L - - -  AST 15 - 41 U/L - - -  ALT 0 - 44 U/L - - -    Lipid Panel     Component Value Date/Time   CHOL 131 02/26/2021 0525   TRIG 55 02/26/2021 0525   HDL 57 02/26/2021 0525   CHOLHDL 2.3 02/26/2021 0525   VLDL 11 02/26/2021 0525   LDLCALC 63 02/26/2021 0525    No components found for: NTPROBNP Recent Labs    06/04/21 1055   PROBNP 570   Recent Labs    02/27/21 1211  TSH 0.633    BMP Recent Labs    04/10/21 1018 06/04/21 1056 06/15/21 1834 06/16/21 1136  NA 140 150* 139 142  K 3.4* 4.9 4.5 4.7  CL 104 108* 107 110  CO2 29 24 23 23   GLUCOSE 118* 92 106* 86  BUN 7* 13 19 15   CREATININE 0.69 1.00 1.22* 1.06*  CALCIUM 8.6* 9.6 8.7* 8.9  GFRNONAA >60  --  46* 54*    HEMOGLOBIN A1C Lab Results  Component Value Date   HGBA1C 5.5 02/26/2021   MPG 111.15 02/26/2021    IMPRESSION:    ICD-10-CM   1. Paroxysmal A-fib (HCC)  I48.0 EKG 12-Lead    2. Long term (current) use of anticoagulants  Z79.01     3. Benign hypertension  I10        RECOMMENDATIONS: Khyler Martzall is a 76 y.o. Caucasian female whose past medical history and cardiac risk factors include: Paroxysmal atrial fibrillation, status post cardioversion, long-term oral anticoagulation/antiarrhythmic medications, chronic HFpEF, left MCA stroke, hyperlipidemia, COPD, pulmonary nodules, hypertension, aortic atherosclerosis, advanced age, postmenopausal female.  Paroxysmal atrial fibrillation  Rate control: N/A Rhythm control: N/A Thromboembolic prophylaxis: Eliquis CHA2DS2-VASc SCORE is 5 which correlates to 7.2% risk of stroke per year.   S/p TEE guided cardioversion on 03/01/2021 -converted to SR but shortly reverted back to A. fib.   After IV amiodarone loading patient underwent direct-current cardioversion on 03/05/2021 restored normal sinus rhythm.   At office visit on 06/13/2021 amiodarone was reduced from 20 mg to 100 mg once daily and verapamil increased from 120 mg to 240 mg p.o. daily Due to symptomatic bradycardia during hospitalization 06/15/2021 both verapamil and amiodarone were discontinued. Patient continues to tolerate anticoagulation without bleeding diathesis. EKG today reveals patient remains in normal sinus rhythm, will therefore hold off on restarting amiodarone and verapamil at this time.  However will discuss  further with patient's primary cardiologist (Dr. Terri Skains), could consider restarting amiodarone 100 mg p.o. daily.  Long-term oral anticoagulation: Indication: Paroxysmal atrial fibrillation. She continues to tolerate anticoagulation without bleeding diathesis. Hgb 06/15/2021 11.8 g/dL   Long-term antiarrhythmic medications: Patient was previously on amiodarone, however this was discontinued 06/15/2021 due to symptomatic bradycardia.  Could consider restarting amiodarone 100 mg p.o. daily, however we will first discuss with patient's primary cardiologist Dr. Terri Skains.    Chronic heart failure with preserved EF stage C, NYHA class II: Diagnosed in the setting of A. fib with RVR. Echo 02/2021:  LVEF 55 to 123456, grade 2 diastolic dysfunction, no significant valvular heart disease, estimated RAP 8 mmHg. There is no clinical evidence of acute heart failure at today's office visit.  For now we will continue current medications. Can consider up titration of guideline directed medical therapy as hemodynamics and renal function allow.   Left MCA stroke: Likely cardioembolic in the setting of newly diagnosed A. fib  Patient is enrolled in principal care management, reviewed heart rate and blood pressure both of which remain fairly well controlled on home monitoring. Blood pressure goal is <130/90 millimeters Hg. LDL goal is <70, as of 02/2021 LDL well controlled at 63 mg/dL   Hyperlipidemia:  Continue statin therapy She denies myalgia or other side effects.  FINAL MEDICATION LIST END OF ENCOUNTER: No orders of the defined types were placed in this encounter.   There are no discontinued medications.    Current Outpatient Medications:    amLODipine (NORVASC) 5 MG tablet, Take 1 tablet (5 mg total) by mouth daily., Disp: 30 tablet, Rfl: 0   apixaban (ELIQUIS) 5 MG TABS tablet, Take 1 tablet (5 mg total) by mouth 2 (two) times daily., Disp: 180 tablet, Rfl: 0   atorvastatin (LIPITOR) 10 MG tablet, Take 1  tablet (10 mg total) by mouth daily., Disp: 30 tablet, Rfl: 0   carboxymethylcellulose (REFRESH PLUS) 0.5 % SOLN, Place 1 drop into both eyes 4 (four) times daily as needed (dry eyes)., Disp: , Rfl:    cholecalciferol (VITAMIN D) 25 MCG (1000 UNIT) tablet, Take 1,000 Units by mouth daily., Disp: , Rfl:    losartan (COZAAR) 25 MG tablet, Take 25 mg by mouth daily., Disp: , Rfl:    magnesium oxide (MAG-OX) 400 MG tablet, Take 1 tablet (400 mg total) by mouth daily., Disp: 30 tablet, Rfl: 0   mometasone-formoterol (DULERA) 100-5 MCG/ACT AERO, Inhale 2 puffs into the lungs 2 (two) times daily., Disp: 13 g, Rfl: 0   Multiple Vitamins-Minerals (CERTAVITE/ANTIOXIDANTS) TABS, Take 1 tablet by mouth daily., Disp: 60 tablet, Rfl: 0   omeprazole (PRILOSEC) 20 MG capsule, Take 1 capsule (20 mg total) by mouth daily., Disp: 30 capsule, Rfl: 0   Psyllium (DAILY FIBER PO), Take 160 mg by mouth daily., Disp: , Rfl:    senna-docusate (SENOKOT-S) 8.6-50 MG tablet, Take 2 tablets by mouth 2 (two) times daily., Disp: , Rfl:    umeclidinium bromide (INCRUSE ELLIPTA) 62.5 MCG/ACT AEPB, Inhale 1 puff into the lungs daily., Disp: , Rfl:   Orders Placed This Encounter  Procedures   EKG 12-Lead    There are no Patient Instructions on file for this visit.   --Continue cardiac medications as reconciled in final medication list. --No follow-ups on file. Or sooner if needed. --Continue follow-up with your primary care physician regarding the management of your other chronic comorbid conditions.  Patient's questions and concerns were addressed to her satisfaction. She voices understanding of the instructions provided during this encounter.   This note was created using a voice recognition software as a result there may be grammatical errors inadvertently enclosed that do not reflect the nature of this encounter. Every attempt is made to correct such errors.  I reviewed hospital records including provider notes,  laboratory results, EKG.    Alethia Berthold, PA-C 06/24/2021, 1:40 PM Office: [phone]  Addendum 07/18/2021:  Discussed patient with Dr. Terri Skains who agrees with holding off on restarting amiodarone.    Alethia Berthold, PA-C 07/18/2021, 3:32 PM Office: [phone]

## 2021-07-15 ENCOUNTER — Telehealth: Payer: Self-pay

## 2021-07-15 ENCOUNTER — Other Ambulatory Visit: Payer: Self-pay

## 2021-07-15 MED ORDER — AMLODIPINE BESYLATE 5 MG PO TABS: 5.0000 mg | ORAL_TABLET | Freq: Every day | ORAL | 1 refills | Status: DC

## 2021-07-15 NOTE — Telephone Encounter (Signed)
Patient son called asking for refills for Amlodipine 5mg . This has been sent to patient pharmacy. ?

## 2021-07-21 ENCOUNTER — Telehealth: Payer: Self-pay | Admitting: Cardiology

## 2021-07-21 DIAGNOSIS — I480 Paroxysmal atrial fibrillation: Secondary | ICD-10-CM

## 2021-07-21 NOTE — Telephone Encounter (Signed)
Son called to inform that patient had an episode yesterday (07/20/2021) of "Afib" lasting for 5-7 seconds. Son stated that he was asked to inform.  ?Patient currently asymptomatic. No further change needed at this time. Will inform Dr. Odis Hollingshead and Ms Carlynn Purl who see her outpatient. ? ?Time spent: 5 min ? ? ?Elder Negus, MD ?Pager: [phone] ?Office: [phone] ? ?

## 2021-07-24 ENCOUNTER — Telehealth: Payer: Self-pay | Admitting: Student

## 2021-07-24 MED ORDER — VERAPAMIL HCL ER 120 MG PO TBCR: 120.0000 mg | EXTENDED_RELEASE_TABLET | Freq: Every day | ORAL | 3 refills | Status: DC

## 2021-07-24 NOTE — Telephone Encounter (Signed)
Patient felt weak and palpitations for several seconds over the weekend, concerning for an episode of a fib. Will therefore stop amlodipine and start verapamil 120 mg daily. Will see patient in 1-2 weeks for follow up.  ?

## 2021-07-24 NOTE — Telephone Encounter (Signed)
Thanks for the update.  °ST

## 2021-07-27 ENCOUNTER — Telehealth: Payer: Self-pay | Admitting: Cardiology

## 2021-07-29 NOTE — Progress Notes (Deleted)
?[facility] ?[id number] ?[place]. [place] [id number] ?[phone] ? ?     STROKE FOLLOW UP NOTE ? ?[name] ?Date of Birth:  08-20-1945 ?Medical Record Number:  [id number]  ? ?Reason for Referral: stroke follow up ? ? ? ?SUBJECTIVE: ? ? ?CHIEF COMPLAINT:  ?No chief complaint on file. ? ? ?HPI:  ? ?Update 07/29/2021 JM: 76 year old female with history of left MCA stroke in 02/2021 who returns for 7462-month stroke follow-up.  Overall stable without new stroke/TIA symptoms.  Reports residual ***.  ? ?Compliant on Eliquis and atorvastatin without side effects.  Blood pressure today ***.  She is closely followed by PCP and cardiology.  No further concerns at this time. ? ? ? ? ? ? ?History provided for reference purposes only ?Initial visit 04/29/2021 JM: Patient being seen for hospital follow up accompanied by her son.  ? ?Speech gradually improving - per son, still having issues with word finding, understanding what is being said,  reading comprehension and writing. Living by herself but son checks on her frequently -she is maintaining all ADLs independently and some IADLs such as cooking, cleaning, and medications. Son paying bills, provides transportation and grocery shopping. She questions return to driving.  Denies cognitive issues.  Denies any weakness or swallowing difficulties. She does become tearful during visit due to loss of independence and inability to drive.  [facility]. Cleared by PT/OT.  Denies new stroke/TIA symptoms.  Compliant on apixaban 5 mg twice daily and atorvastatin 10 mg daily without side effects.  Blood pressure today 158/77 -monitored by SLP which has been stable (unable to provide exact numbers). Has since been seen by PCP. Has initial evaluation with cardiologist Dr. Odis Hollingsheadolia this week. No further concerns at this time.  ? ?Hospital follow-up 02/25/2021 ?Michelle Dean is a 76 y.o. female with a PMHx of COPD, HTN and HLD who presented to the [facility]  on 02/25/2021 for evaluation of acute onset confusion. Eval by teleneurology - per note, exam more consistent with aphasia. CTH negative and TNK administered. CTA left M2/M3 occlusion and [facility] and possible thromb[facility].  Personally reviewed hospitalization pertinent progress notes, lab work and imaging.  [facility] flow and reocclusion. MR brain showed left MCA with petechial hemorrhage.  EF 55 to 60%.  LE Doppler negative.  LDL 63 on atorvastatin 10 mg daily.  A1c 5.5.  Found to be in atrial fibrillation with unsuccessful cardioversion 10/21 and second attempt 10/25 after amiodarone load which was successful. New dx of A fib -Eliquis started 10/23.  TEE no evidence of thrombus or vegetation. Hx of HTN with hypotension during admission requiring midodrine 10 mg TID.  No prior stroke history.  Treated with 10-day course of doxycycline for cough with sputum production and leukocytosis.  Therapy eval's recommended CIR for ongoing therapy needs. ? ? ? ? ? ? ? ?PERTINENT IMAGING ? ?Per recent hospitalization ?CTH no acute finding ?CTA head and neck left M2/M3 occlusion ?S/p IR with TICI2c and then re-occlusion ?Repeat CT showed left MCA contrast staining but more clear margin of left temporoparietal infarct.  ?MRI large inferior MCA infarct with petechial hemorrhage ?2D Echo EF 55-60% ?LE venous doppler no DVT ?LDL 63 ?HgbA1c 5.5 ? ? ? ?ROS:   ?N/A d/t speech impairment ? ? ?PMH:  ?Past Medical History:  ?Diagnosis Date  ? (HFpEF) heart failure with preserved ejection fraction (HCC)   ?  Hyperlipidemia   ? Hypertension   ? Paroxysmal atrial fibrillation (HCC)   ? RBBB   ? Stroke [facility])   ? ? ?PSH:  ?Past Surgical History:  ?Procedure Laterality Date  ? BUBBLE STUDY  03/01/2021  ? Procedure: BUBBLE STUDY;  Surgeon: Tessa Lerner, DO;  Location: [facility] ENDOSCOPY;  Service: Cardiovascular;;  ? CARDIOVERSION N/A 03/01/2021  ? Procedure: CARDIOVERSION;  Surgeon:  Tessa Lerner, DO;  Location: [facility] ENDOSCOPY;  Service: Cardiovascular;  Laterality: N/A;  ? CARDIOVERSION N/A 03/05/2021  ? Procedure: CARDIOVERSION;  Surgeon: Yates Decamp, MD;  Location: [facility] ENDOSCOPY;  Service: Cardiovascular;  Laterality: N/A;  ? IR CT HEAD LTD  02/25/2021  ? IR PERCUTANEOUS ART THROMBECTOMY/INFUSION INTRACRANIAL INC DIAG ANGIO  02/25/2021  ? RADIOLOGY WITH ANESTHESIA N/A 02/25/2021  ? Procedure: IR WITH ANESTHESIA;  Surgeon: Radiologist, Medication, MD;  Location: [facility] OR;  Service: Radiology;  Laterality: N/A;  ? TEE WITHOUT CARDIOVERSION N/A 03/01/2021  ? Procedure: TRANSESOPHAGEAL ECHOCARDIOGRAM (TEE);  Surgeon: Tessa Lerner, DO;  Location: [facility] ENDOSCOPY;  Service: Cardiovascular;  Laterality: N/A;  ? ? ?Social History:  ?Social History  ? ?Socioeconomic History  ? Marital status: Widowed  ?  Spouse name: Not on file  ? Number of children: 1  ? Years of education: Not on file  ? Highest education level: Not on file  ?Occupational History  ? Not on file  ?Tobacco Use  ? Smoking status: Former  ?  Packs/day: 1.00  ?  Years: 27.00  ?  Pack years: 27.00  ?  Types: Cigarettes  ?  Quit date: 2000  ?  Years since quitting: 23.2  ?  Passive exposure: Never  ? Smokeless tobacco: Never  ?Vaping Use  ? Vaping Use: Never used  ?Substance and Sexual Activity  ? Alcohol use: Never  ? Drug use: Never  ? Sexual activity: Not Currently  ?Other Topics Concern  ? Not on file  ?Social History Narrative  ? Not on file  ? ?Social Determinants of Health  ? ?Financial Resource Strain: Not on file  ?Food Insecurity: Not on file  ?Transportation Needs: Not on file  ?Physical Activity: Not on file  ?Stress: Not on file  ?Social Connections: Not on file  ?Intimate Partner Violence: Not on file  ? ? ?Family History:  ?Family History  ?Problem Relation Age of Onset  ? Heart disease Mother   ? Cancer Mother 18  ? Kidney failure Father   ? Colon cancer Sister   ? ? ?Medications:   ?Current Outpatient Medications on File Prior to  Visit  ?Medication Sig Dispense Refill  ? apixaban (ELIQUIS) 5 MG TABS tablet Take 1 tablet (5 mg total) by mouth 2 (two) times daily. 180 tablet 0  ? atorvastatin (LIPITOR) 10 MG tablet Take 1 tablet (10 mg total) by mouth daily. 30 tablet 0  ? carboxymethylcellulose (REFRESH PLUS) 0.5 % SOLN Place 1 drop into both eyes 4 (four) times daily as needed (dry eyes).    ? cholecalciferol (VITAMIN D) 25 MCG (1000 UNIT) tablet Take 1,000 Units by mouth daily.    ? losartan (COZAAR) 25 MG tablet Take 25 mg by mouth daily.    ? magnesium oxide (MAG-OX) 400 MG tablet Take 1 tablet (400 mg total) by mouth daily. 30 tablet 0  ? mometasone-formoterol (DULERA) 100-5 MCG/ACT AERO Inhale 2 puffs into the lungs 2 (two) times daily. 13 g 0  ? Multiple Vitamins-Minerals (CERTAVITE/ANTIOXIDANTS) TABS Take 1 tablet by mouth daily. 60 tablet 0  ?  omeprazole (PRILOSEC) 20 MG capsule Take 1 capsule (20 mg total) by mouth daily. 30 capsule 0  ? Psyllium (DAILY FIBER PO) Take 160 mg by mouth daily.    ? senna-docusate (SENOKOT-S) 8.6-50 MG tablet Take 2 tablets by mouth 2 (two) times daily.    ? umeclidinium bromide (INCRUSE ELLIPTA) 62.5 MCG/ACT AEPB Inhale 1 puff into the lungs daily.    ? verapamil (CALAN-SR) 120 MG CR tablet Take 1 tablet (120 mg total) by mouth at bedtime. 90 tablet 3  ? ?No current facility-administered medications on file prior to visit.  ? ? ?Allergies:   ?Allergies  ?Allergen Reactions  ? Aspirin   ?  Other reaction(s): Bleeding  ? Penicillins Hives and Rash  ? Lisinopril Cough  ? ? ? ? ?OBJECTIVE: ? ?Physical Exam ? ?There were no vitals filed for this visit. ? ?There is no height or weight on file to calculate BMI. ?No results found. ? ? ?General: well developed, well nourished, very pleasant elderly Caucasian female, seated, in no evident distress ?Head: head normocephalic and atraumatic.   ?Neck: supple with no carotid or supraclavicular bruits ?Cardiovascular: regular rate and rhythm, no  murmurs ?Musculoskeletal: no deformity ?Skin:  no rash/petichiae ?Vascular:  Normal pulses all extremities ?  ?Neurologic Exam ?Mental Status: Awake and fully alert. moderate expressive and receptive aphasia. Able to follow com

## 2021-07-30 ENCOUNTER — Ambulatory Visit: Payer: Medicare Other | Admitting: Adult Health

## 2021-07-30 ENCOUNTER — Encounter: Payer: Self-pay | Admitting: Adult Health

## 2021-07-30 ENCOUNTER — Encounter (HOSPITAL_COMMUNITY): Payer: Self-pay | Admitting: Radiology

## 2021-08-01 NOTE — Telephone Encounter (Signed)
error 

## 2021-09-10 ENCOUNTER — Encounter: Payer: Self-pay | Admitting: Cardiology

## 2021-09-10 ENCOUNTER — Ambulatory Visit: Payer: Medicare Other | Admitting: Cardiology

## 2021-09-10 VITALS — BP 135/79 | HR 75 | Temp 98.0°F | Resp 16 | Ht 64.0 in | Wt 189.0 lb

## 2021-09-10 DIAGNOSIS — I480 Paroxysmal atrial fibrillation: Secondary | ICD-10-CM

## 2021-09-10 DIAGNOSIS — R911 Solitary pulmonary nodule: Secondary | ICD-10-CM

## 2021-09-10 DIAGNOSIS — I5032 Chronic diastolic (congestive) heart failure: Secondary | ICD-10-CM

## 2021-09-10 DIAGNOSIS — I6602 Occlusion and stenosis of left middle cerebral artery: Secondary | ICD-10-CM

## 2021-09-10 DIAGNOSIS — I700 Atherosclerosis of aorta: Secondary | ICD-10-CM

## 2021-09-10 DIAGNOSIS — Z87891 Personal history of nicotine dependence: Secondary | ICD-10-CM

## 2021-09-10 DIAGNOSIS — I10 Essential (primary) hypertension: Secondary | ICD-10-CM

## 2021-09-10 DIAGNOSIS — Z7901 Long term (current) use of anticoagulants: Secondary | ICD-10-CM

## 2021-09-10 DIAGNOSIS — E782 Mixed hyperlipidemia: Secondary | ICD-10-CM

## 2021-09-10 NOTE — Progress Notes (Signed)
? ?Date:  09/10/2021  ? ?ID:  Michelle Dean, DOB Jan 16, 1946, MRN [id number] ? ?PCP:  Fannie Knee, MD  ?Cardiologist:  Rex Kras, DO, [facility] (established care 02/27/2021) ? ?Date: 09/10/21 ?Last Office Visit: 06/13/2021 ? ?Chief Complaint  ?Patient presents with  ? Atrial Fibrillation  ? Follow-up  ? ? ?HPI  ?Michelle Dean is a 76 y.o. Caucasian female whose past medical history and cardiovascular risk factors include: Paroxysmal atrial fibrillation, status post cardioversion, long-term oral anticoagulation/antiarrhythmic medications, chronic HFpEF, left MCA stroke, hyperlipidemia, COPD, pulmonary nodules, hypertension, aortic atherosclerosis, advanced age, postmenopausal female. ?  ?In October 2022 patient presents with acute onset of confusion/expressive and receptive aphasia and underwent TNKase and the CT of the head was negative for intracranial bleeding.  CTA noted left M2/M3 occlusion and she underwent partial recannulation with reocclusion followed by mechanical thrombolysis achieving TICI 2C revascularization.angiography/thrombectomy.  Postprocedure was found to be in A-fib with RVR and cardiology was consulted during that hospitalization.  She was converted to normal sinus rhythm after second attempt and was later discharged home.  She followed up with the practice and at the last visit the shared decision was to discontinue amiodarone due to medication profile and increase diltiazem to 240 mg p.o. daily. ? ?However, with up titration of diltiazem to 240 mg patient became symptomatic bradycardic and went to ED for further evaluation and management.  She now presents for follow-up.  Since last office visit she has had several telephone encounters back-and-forth during which amlodipine was discontinued and she was asked to restart diltiazem 120 mg p.o. daily.  Patient and her son do not know exactly what medication she is taking on a regular basis.  They will call the office back to update the  [facility]. ? ?FUNCTIONAL STATUS: ?No structured exercise program or daily routine.  ? ?ALLERGIES: ?Allergies  ?Allergen Reactions  ? Aspirin   ?  Other reaction(s): Bleeding  ? Penicillins Hives and Rash  ? Lisinopril Cough  ? ? ?MEDICATION LIST PRIOR TO VISIT: ?Current Meds  ?Medication Sig  ? apixaban (ELIQUIS) 5 MG TABS tablet Take 1 tablet (5 mg total) by mouth 2 (two) times daily.  ? atorvastatin (LIPITOR) 10 MG tablet Take 1 tablet (10 mg total) by mouth daily.  ? carboxymethylcellulose (REFRESH PLUS) 0.5 % SOLN Place 1 drop into both eyes 4 (four) times daily as needed (dry eyes).  ? cholecalciferol (VITAMIN D) 25 MCG (1000 UNIT) tablet Take 1,000 Units by mouth daily.  ? losartan (COZAAR) 25 MG tablet Take 25 mg by mouth daily.  ? magnesium oxide (MAG-OX) 400 MG tablet Take 1 tablet (400 mg total) by mouth daily.  ? mometasone-formoterol (DULERA) 100-5 MCG/ACT AERO Inhale 2 puffs into the lungs 2 (two) times daily.  ? Multiple Vitamins-Minerals (CERTAVITE/ANTIOXIDANTS) TABS Take 1 tablet by mouth daily.  ? omeprazole (PRILOSEC) 20 MG capsule Take 1 capsule (20 mg total) by mouth daily.  ? Psyllium (DAILY FIBER PO) Take 160 mg by mouth daily.  ? senna-docusate (SENOKOT-S) 8.6-50 MG tablet Take 2 tablets by mouth 2 (two) times daily.  ? umeclidinium bromide (INCRUSE ELLIPTA) 62.5 MCG/ACT AEPB Inhale 1 puff into the lungs daily.  ?  ? ?PAST MEDICAL HISTORY: ?Past Medical History:  ?Diagnosis Date  ? (HFpEF) heart failure with preserved ejection fraction ([place])   ? Hyperlipidemia   ? Hypertension   ? Paroxysmal atrial fibrillation (HCC)   ? RBBB   ? Stroke [facility])   ? ? ?PAST SURGICAL HISTORY: ?Past Surgical  History:  ?Procedure Laterality Date  ? BUBBLE STUDY  03/01/2021  ? Procedure: BUBBLE STUDY;  Surgeon: Rex Kras, DO;  Location: [place];  Service: Cardiovascular;;  ? CARDIOVERSION N/A 03/01/2021  ? Procedure: CARDIOVERSION;  Surgeon: Rex Kras, DO;  Location: [place] ENDOSCOPY;  Service: Cardiovascular;   Laterality: N/A;  ? CARDIOVERSION N/A 03/05/2021  ? Procedure: CARDIOVERSION;  Surgeon: Adrian Prows, MD;  Location: [place];  Service: Cardiovascular;  Laterality: N/A;  ? IR CT HEAD LTD  02/25/2021  ? IR PERCUTANEOUS ART THROMBECTOMY/INFUSION INTRACRANIAL INC DIAG ANGIO  02/25/2021  ? RADIOLOGY WITH ANESTHESIA N/A 02/25/2021  ? Procedure: IR WITH ANESTHESIA;  Surgeon: Radiologist, Medication, MD;  Location: [place];  Service: Radiology;  Laterality: N/A;  ? TEE WITHOUT CARDIOVERSION N/A 03/01/2021  ? Procedure: TRANSESOPHAGEAL ECHOCARDIOGRAM (TEE);  Surgeon: Rex Kras, DO;  Location: [facility] ENDOSCOPY;  Service: Cardiovascular;  Laterality: N/A;  ? ? ?FAMILY HISTORY: ?The patient family history includes Cancer (age of onset: 32) in her mother; Colon cancer in her sister; Heart disease in her mother; Kidney failure in her father. ? ?SOCIAL HISTORY:  ?The patient  reports that she quit smoking about 23 years ago. Her smoking use included cigarettes. She has a 27.00 pack-year smoking history. She has never been exposed to tobacco smoke. She has never used smokeless tobacco. She reports that she does not drink alcohol and does not use drugs. ? ?REVIEW OF SYSTEMS: ?Review of Systems  ?Constitutional: Negative for chills and fever.  ?HENT:  Negative for hoarse voice and nosebleeds.   ?Eyes:  Negative for discharge, double vision and pain.  ?Cardiovascular:  Negative for chest pain, claudication, dyspnea on exertion, leg swelling, near-syncope, orthopnea, palpitations, paroxysmal nocturnal dyspnea and syncope.  ?Respiratory:  Negative for hemoptysis and shortness of breath.   ?Musculoskeletal:  Negative for muscle cramps and myalgias.  ?Gastrointestinal:  Negative for abdominal pain, constipation, diarrhea, hematemesis, hematochezia, melena, nausea and vomiting.  ?Neurological:  Negative for dizziness and light-headedness.  ?     Degree of expressive and receptive aphasia - per son.   ? ? ?PHYSICAL EXAM: ? ?  09/10/2021  ?   1:28 PM 06/24/2021  ? 12:32 PM 06/16/2021  ? 11:44 AM  ?Vitals with BMI  ?Height 5\' 4"  5\' 4"    ?Weight 189 lbs 187 lbs 3 oz   ?BMI 32.43 32.12   ?Systolic [id number]  ?Diastolic 79 73 82  ?Pulse 75 71 66  ? ? ?CONSTITUTIONAL: Appears older than stated age, hemodynamically stable, no acute distress.  ?SKIN: Skin is warm and dry. No rash noted. No cyanosis. No pallor. No jaundice ?HEAD: Normocephalic and atraumatic.  ?EYES: No scleral icterus ?MOUTH/THROAT: Moist oral membranes.  ?NECK: No JVD present. No thyromegaly noted. No carotid bruits  ?LYMPHATIC: No visible cervical adenopathy.  ?CHEST Normal respiratory effort. No intercostal retractions  ?LUNGS: Clear to auscultation bilaterally.  No stridor. No wheezes. No rales.  ?CARDIOVASCULAR: Regular rate and rhythm, positive S1-S2, no murmurs rubs or gallops appreciated. ?ABDOMINAL: Obese, soft, nontender, nondistended, positive bowel sounds in all 4 quadrants, no apparent ascites.  ?EXTREMITIES: No peripheral edema, warm to touch, 2+ bilateral DP and PT pulses ?HEMATOLOGIC: No significant bruising ?NEUROLOGIC: Oriented to person, place, and time. Nonfocal. Normal muscle tone.  ?PSYCHIATRIC: Normal mood and affect. Normal behavior. Cooperative ? ?RADIOLOGY: ?CT PE protocol 04/10/2021 ?1. No pulmonary embolus or acute aortic syndrome. ?2. Hiatal hernia. ?3. Multiple pulmonary nodules. Most severe: 10 mm right lower lobe pulmonary nodule. Consider a  non-contrast Chest CT at 3 months, aPET/CT, or tissue sampling. These guidelines do not apply to immunocompromised patients and patients with cancer. Follow up in patients with significant comorbidities as clinically warranted. For lung cancer screening, adhere to Lung-RADS guidelines. Reference: Radiology. 2017; 284(1):228-43. ?4. Aortic Atherosclerosis (ICD10-I70.0) and Emphysema (ICD10-J43.9). ? ?CARDIAC DATABASE: ?EKG: ?02/27/2021: Atrial flutter 2:1, 130bpm, right bundle branch block.  No prior EKGs available for  comparison. ?03/01/2021: Atrial fibrillation with rapid ventricular rate, 105 bpm, right bundle branch block. ?09/10/2021: Sinus rhythm, 68 bpm, right bundle branch block. ?  ?Echocardiogram: ?02/26/2021: ?1. Left ventricular ej

## 2021-10-03 ENCOUNTER — Other Ambulatory Visit: Payer: Self-pay | Admitting: Cardiology

## 2021-10-03 DIAGNOSIS — Z7901 Long term (current) use of anticoagulants: Secondary | ICD-10-CM

## 2021-10-03 DIAGNOSIS — I480 Paroxysmal atrial fibrillation: Secondary | ICD-10-CM

## 2022-01-01 ENCOUNTER — Other Ambulatory Visit: Payer: Self-pay | Admitting: Cardiology

## 2022-01-01 DIAGNOSIS — I480 Paroxysmal atrial fibrillation: Secondary | ICD-10-CM

## 2022-01-01 DIAGNOSIS — Z7901 Long term (current) use of anticoagulants: Secondary | ICD-10-CM

## 2022-03-24 ENCOUNTER — Ambulatory Visit: Payer: Medicare Other | Admitting: Cardiology

## 2022-04-01 ENCOUNTER — Other Ambulatory Visit: Payer: Self-pay | Admitting: Cardiology

## 2022-04-01 DIAGNOSIS — I480 Paroxysmal atrial fibrillation: Secondary | ICD-10-CM

## 2022-04-01 DIAGNOSIS — Z7901 Long term (current) use of anticoagulants: Secondary | ICD-10-CM

## 2022-04-10 ENCOUNTER — Ambulatory Visit: Payer: Medicare Other | Admitting: Cardiology

## 2022-04-21 ENCOUNTER — Ambulatory Visit: Payer: Medicare Other | Admitting: Cardiology

## 2022-04-21 ENCOUNTER — Encounter: Payer: Self-pay | Admitting: Cardiology

## 2022-04-21 VITALS — BP 118/75 | HR 76 | Resp 18 | Ht 64.0 in | Wt 196.8 lb

## 2022-04-21 DIAGNOSIS — I700 Atherosclerosis of aorta: Secondary | ICD-10-CM

## 2022-04-21 DIAGNOSIS — I5032 Chronic diastolic (congestive) heart failure: Secondary | ICD-10-CM

## 2022-04-21 DIAGNOSIS — Z7901 Long term (current) use of anticoagulants: Secondary | ICD-10-CM

## 2022-04-21 DIAGNOSIS — I10 Essential (primary) hypertension: Secondary | ICD-10-CM

## 2022-04-21 DIAGNOSIS — E782 Mixed hyperlipidemia: Secondary | ICD-10-CM

## 2022-04-21 DIAGNOSIS — I480 Paroxysmal atrial fibrillation: Secondary | ICD-10-CM

## 2022-04-21 DIAGNOSIS — Z87891 Personal history of nicotine dependence: Secondary | ICD-10-CM

## 2022-04-21 DIAGNOSIS — I6602 Occlusion and stenosis of left middle cerebral artery: Secondary | ICD-10-CM

## 2022-04-21 NOTE — Progress Notes (Signed)
Date:  04/21/2022   ID:  Michelle Dean, DOB 1945-06-05, MRN [id number]  PCP:  De Blanch, MD  Cardiologist:  Tessa Lerner, DO, [facility] (established care 02/27/2021)  Date: 04/21/22 Last Office Visit: 09/10/2021  Chief Complaint  Patient presents with   Atrial Fibrillation   Follow-up    6 month    HPI  Michelle Dean is a 76 y.o. Caucasian female whose past medical history and cardiovascular risk factors include: Paroxysmal atrial fibrillation, status post cardioversion, long-term oral anticoagulation/antiarrhythmic medications, chronic HFpEF, left MCA stroke, hyperlipidemia, COPD, pulmonary nodules, hypertension, aortic atherosclerosis, advanced age, postmenopausal female.   In October 2022 patient presents with acute onset of confusion/expressive and receptive aphasia and underwent TNKase and the CT of the head was negative for intracranial bleeding.  CTA noted left M2/M3 occlusion and she underwent partial recannulation with reocclusion followed by mechanical thrombolysis achieving TICI 2C revascularization.angiography/thrombectomy.  Postprocedure was found to be in A-fib with RVR and cardiology was consulted during that hospitalization.  She was converted to normal sinus rhythm after second attempt and was later discharged home.  She followed up with the practice and at the last visit the shared decision was to discontinue amiodarone due to medication profile and increase diltiazem to 240 mg p.o. daily.  She was later hospitalized due to symptomatic bradycardia and calcium channel blockers were held.  At the time of discharge she was recommended to reduce the dose of diltiazem back down to 120 mg p.o. daily.  She presents today for 62-month follow-up visit with her husband and sister.  She denies anginal discomfort or heart failure symptoms.  She remains in normal sinus rhythm.  She does not endorse any evidence of bleeding. Patient is in remote monitoring for BP and average BP is 118/66  w/ HR 75bpm.   FUNCTIONAL STATUS: No structured exercise program or daily routine.   ALLERGIES: Allergies  Allergen Reactions   Aspirin     Other reaction(s): Bleeding   Penicillins Hives and Rash   Lisinopril Cough    MEDICATION LIST PRIOR TO VISIT: Current Meds  Medication Sig   amLODipine (NORVASC) 5 MG tablet Take 5 mg by mouth daily.   atorvastatin (LIPITOR) 10 MG tablet Take 1 tablet (10 mg total) by mouth daily.   carboxymethylcellulose (REFRESH PLUS) 0.5 % SOLN Place 1 drop into both eyes 4 (four) times daily as needed (dry eyes).   cholecalciferol (VITAMIN D) 25 MCG (1000 UNIT) tablet Take 1,000 Units by mouth daily.   ELIQUIS 5 MG TABS tablet TAKE ONE TABLET TWICE A DAY.   magnesium oxide (MAG-OX) 400 MG tablet Take 1 tablet (400 mg total) by mouth daily.   mometasone-formoterol (DULERA) 100-5 MCG/ACT AERO Inhale 2 puffs into the lungs 2 (two) times daily.   Multiple Vitamins-Minerals (CERTAVITE/ANTIOXIDANTS) TABS Take 1 tablet by mouth daily.   omeprazole (PRILOSEC) 20 MG capsule Take 1 capsule (20 mg total) by mouth daily.   Psyllium (DAILY FIBER PO) Take 160 mg by mouth daily.   senna-docusate (SENOKOT-S) 8.6-50 MG tablet Take 2 tablets by mouth 2 (two) times daily.   umeclidinium bromide (INCRUSE ELLIPTA) 62.5 MCG/ACT AEPB Inhale 1 puff into the lungs daily.     PAST MEDICAL HISTORY: Past Medical History:  Diagnosis Date   (HFpEF) heart failure with preserved ejection fraction (HCC)    Hyperlipidemia    Hypertension    Paroxysmal atrial fibrillation (HCC)    RBBB    Stroke (HCC)     PAST SURGICAL  HISTORY: Past Surgical History:  Procedure Laterality Date   BUBBLE STUDY  03/01/2021   Procedure: BUBBLE STUDY;  Surgeon: Tessa Lerner, DO;  Location: [facility] ENDOSCOPY;  Service: Cardiovascular;;   CARDIOVERSION N/A 03/01/2021   Procedure: CARDIOVERSION;  Surgeon: Tessa Lerner, DO;  Location: [facility] ENDOSCOPY;  Service: Cardiovascular;  Laterality: N/A;   CARDIOVERSION  N/A 03/05/2021   Procedure: CARDIOVERSION;  Surgeon: Yates Decamp, MD;  Location: [facility] ENDOSCOPY;  Service: Cardiovascular;  Laterality: N/A;   IR CT HEAD LTD  02/25/2021   IR PERCUTANEOUS ART THROMBECTOMY/INFUSION INTRACRANIAL INC DIAG ANGIO  02/25/2021   RADIOLOGY WITH ANESTHESIA N/A 02/25/2021   Procedure: IR WITH ANESTHESIA;  Surgeon: Radiologist, Medication, MD;  Location: [facility] OR;  Service: Radiology;  Laterality: N/A;   TEE WITHOUT CARDIOVERSION N/A 03/01/2021   Procedure: TRANSESOPHAGEAL ECHOCARDIOGRAM (TEE);  Surgeon: Tessa Lerner, DO;  Location: [facility] ENDOSCOPY;  Service: Cardiovascular;  Laterality: N/A;    FAMILY HISTORY: The patient family history includes Cancer (age of onset: 41) in her mother; Colon cancer in her sister; Heart attack in her mother; Heart disease in her mother; Kidney failure in her father.  SOCIAL HISTORY:  The patient  reports that she quit smoking about 23 years ago. Her smoking use included cigarettes. She has a 27.00 pack-year smoking history. She has never been exposed to tobacco smoke. She has never used smokeless tobacco. She reports that she does not drink alcohol and does not use drugs.  REVIEW OF SYSTEMS: Review of Systems  Constitutional: Negative for chills and fever.  HENT:  Negative for hoarse voice and nosebleeds.   Eyes:  Negative for discharge, double vision and pain.  Cardiovascular:  Negative for chest pain, claudication, dyspnea on exertion, leg swelling, near-syncope, orthopnea, palpitations, paroxysmal nocturnal dyspnea and syncope.  Respiratory:  Negative for hemoptysis and shortness of breath.   Musculoskeletal:  Negative for muscle cramps and myalgias.  Gastrointestinal:  Negative for abdominal pain, constipation, diarrhea, hematemesis, hematochezia, melena, nausea and vomiting.  Neurological:  Negative for dizziness and light-headedness.     PHYSICAL EXAM:    04/21/2022   12:49 PM 09/10/2021    1:28 PM 06/24/2021   12:32 PM  Vitals  with BMI  Height 5\' 4"  5\' 4"  5\' 4"   Weight 196 lbs 13 oz 189 lbs 187 lbs 3 oz  BMI 33.76 32.43 32.12  Systolic 118 135  Diastolic 75 79 73  Pulse 76 75 71   Physical Exam  Constitutional: No distress.  Appears older than stated age, hemodynamically stable.   Neck: No JVD present.  Cardiovascular: Normal rate, regular rhythm, S1 normal, S2 normal, intact distal pulses and normal pulses. Exam reveals no gallop, no S3 and no S4.  No murmur heard. Pulmonary/Chest: Effort normal and breath sounds normal. No stridor. She has no wheezes. She has no rales.  Abdominal: Soft. Bowel sounds are normal. She exhibits no distension. There is no abdominal tenderness.  Musculoskeletal:        General: No edema.     Cervical back: Neck supple.  Neurological: She is alert and oriented to person, place, and time. She has intact cranial nerves (2-12).  Skin: Skin is warm and moist.   RADIOLOGY: CT PE protocol 04/10/2021 1. No pulmonary embolus or acute aortic syndrome. 2. Hiatal hernia. 3. Multiple pulmonary nodules. Most severe: 10 mm right lower lobe pulmonary nodule. Consider a non-contrast Chest CT at 3 months, aPET/CT, or tissue sampling. These guidelines do not apply to immunocompromised patients and patients with  cancer. Follow up in patients with significant comorbidities as clinically warranted. For lung cancer screening, adhere to Lung-RADS guidelines. Reference: Radiology. 2017; 284(1):228-43. 4. Aortic Atherosclerosis (ICD10-I70.0) and Emphysema (ICD10-J43.9).  CARDIAC DATABASE: EKG: 02/27/2021: Atrial flutter 2:1, 130bpm, right bundle branch block.  No prior EKGs available for comparison. 03/01/2021: Atrial fibrillation with rapid ventricular rate, 105 bpm, right bundle branch block. 09/10/2021: Sinus rhythm, 68 bpm, right bundle branch block. 04/21/2022: Sinus rhythm, 68bpm, right bundle branch block, low voltage in the precordial leads, no significant change compared to prior ECG  09/10/2021   Echocardiogram: 02/26/2021: 1. Left ventricular ejection fraction, by estimation, is 55 to 60%. The  left ventricle has normal function. The left ventricle has no regional  wall motion abnormalities. Left ventricular diastolic parameters are  consistent with Grade II diastolic dysfunction (pseudonormalization).   2. Right ventricular systolic function is normal. The right ventricular  size is mildly enlarged. Tricuspid regurgitation signal is inadequate for  assessing PA pressure.   3. Left atrial size was mildly dilated.   4. Right atrial size was mildly dilated.   5. The mitral valve is normal in structure. No evidence of mitral valve regurgitation. No evidence of mitral stenosis.   6. The aortic valve is tricuspid. Aortic valve regurgitation is not visualized. Mild aortic valve sclerosis is present, with no evidence of aortic valve stenosis.   7. The inferior vena cava is dilated in size with >50% respiratory variability, suggesting right atrial pressure of 8 mmHg.    TEE guided DDCV:  03/01/2021:  1. Left ventricular ejection fraction, by estimation, is 50 to 55%. The  left ventricle has low normal function. The left ventricle has no regional  wall motion abnormalities.   2. Right ventricular systolic function is normal. The right ventricular  size is mildly enlarged.   3. Left atrial size was mildly dilated. No left atrial/left atrial  appendage thrombus was detected. The LAA emptying velocity was 61 cm/s.   4. Right atrial size was mildly dilated.   5. The mitral valve is normal in structure. Mild mitral valve  regurgitation. No evidence of mitral stenosis.   6. The aortic valve is normal in structure. Aortic valve regurgitation is  not visualized. No aortic stenosis is present.   7. There is mild (Grade II) plaque involving the descending aorta.   8. Agitated saline contrast bubble study was negative, with no evidence  of any interatrial shunt A successful direct  current cardioversion was performed at 100 joules with 1 attempt. Transitioned to normal sinus post direct current cardioversion; however, converted back to atrial fibrillation by the time she was in post-procedural holding.    DCCV: 03/01/2021: 100J x 1 Converted to SR; reverted back to Afib. 03/05/2021: 100J x 1 Converted to SR.    LABORATORY DATA:    Latest Ref Rng & Units 06/15/2021    6:34 PM 04/10/2021   10:23 AM 03/17/2021    7:10 AM  CBC  WBC 4.0 - 10.5 K/uL 11.9  13.3  8.0   Hemoglobin 12.0 - 15.0 g/dL 16.111.8  09.611.3  04.512.0   Hematocrit 36.0 - 46.0 % 38.4  36.3  39.1   Platelets 150 - 400 K/uL 264  381  336        Latest Ref Rng & Units 06/16/2021   11:36 AM 06/15/2021    6:34 PM 06/04/2021   10:56 AM  CMP  Glucose 70 - 99 mg/dL 86  409106  92   BUN 8 -  23 mg/dL Creatinine 0.44 - 1.00 mg/dL 9.52  8.41  3.24   Sodium 135 - 145 mmol/L 142  139  150   Potassium 3.5 - 5.1 mmol/L 4.7  4.5  4.9   Chloride 98 - 111 mmol/L 110  107  108   CO2 22 - 32 mmol/L Calcium 8.9 - 10.3 mg/dL 8.9  8.7  9.6     Lipid Panel     Component Value Date/Time   CHOL 131 02/26/2021 0525   TRIG 55 02/26/2021 0525   HDL 57 02/26/2021 0525   CHOLHDL 2.3 02/26/2021 0525   VLDL 11 02/26/2021 0525   LDLCALC 63 02/26/2021 0525    No components found for: "NTPROBNP" Recent Labs    06/04/21 1055  PROBNP 570   No results for input(s): "TSH" in the last 8760 hours.   BMP Recent Labs    06/04/21 1056 06/15/21 1834 06/16/21 1136  NA 150* 139 142  K 4.9 4.5 4.7  CL 108* 107 110  CO2 GLUCOSE 92 106* 86  BUN CREATININE 1.00 1.22* 1.06*  CALCIUM 9.6 8.7* 8.9  GFRNONAA  --  46* 54*    HEMOGLOBIN A1C Lab Results  Component Value Date   HGBA1C 5.5 02/26/2021   MPG 111.15 02/26/2021   External Labs: Collected: 03/25/2022 available in Care Everywhere. Hemoglobin 13.3 g/dL, hematocrit 40.1% BUN 12, creatinine 1.02. Sodium 146, potassium 4.3,  chloride 106, bicarb 24. AST 16, ALT 8, alkaline phosphatase 78 Total cholesterol 166, triglycerides 74, HDL 72, LDL 80   IMPRESSION:    ICD-10-CM   1. Paroxysmal A-fib (HCC)  I48.0 EKG 12-Lead    PCV ECHOCARDIOGRAM COMPLETE    Hemoglobin and hematocrit, blood    Basic metabolic panel    2. Long term (current) use of anticoagulants  Z79.01 Hemoglobin and hematocrit, blood    Basic metabolic panel    3. Chronic heart failure with preserved ejection fraction (HFpEF) (HCC)  I50.32 PCV ECHOCARDIOGRAM COMPLETE    4. 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 Mixed hyperlipidemia  E78.2     6. Benign hypertension  I10     7. Atherosclerosis of aorta (HCC)  I70.0     8. Former smoker  Z87.891        RECOMMENDATIONS: Michelle Dean is a 76 y.o. Caucasian female whose past medical history and cardiac risk factors include: Paroxysmal atrial fibrillation, status post cardioversion, long-term oral anticoagulation/antiarrhythmic medications, chronic HFpEF, left MCA stroke, hyperlipidemia, COPD, pulmonary nodules, hypertension, aortic atherosclerosis, advanced age, postmenopausal female.  Paroxysmal A-fib (HCC) Rate control: Diltiazem Rhythm control: N/A. Thromboembolic prophylaxis: Eliquis. Click Here to Calculate/Change CHADS2VASc Score The patient's CHADS2-VASc score is 8, indicating a 10.8% annual risk of stroke.  Therefore, anticoagulation is recommended.   CHF History: Yes HTN History: Yes Diabetes History: No Stroke History: Yes Vascular Disease History: Yes History of TEE guided cardioversion. Was on amiodarone but later discontinued secondary to medication profile.  Long term (current) use of anticoagulants Indication: Paroxysmal atrial fibrillation. Does not endorse evidence of bleeding. Outside labs from November 2023 available in Care Everywhere notes stable hemoglobin/hematocrit. We emphasized the risks, benefits, and alternatives to anticoagulation. No  recent falls.  Chronic heart failure with preserved ejection fraction (HFpEF) (HCC) Euvolemic not in congestive heart failure. Echo: 02/2021: LVEF 55-60%, grade 2 diastolic dysfunction, no significant valvular heart  disease, RAP 8 mmHg. Likely secondary to A-fib with RVR. Continue current medical therapy -does not want to change medical therapy at this time as she is stable.  If and when needed would recommend transitioning off of amlodipine and considering either spironolactone or [place].  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 Educated on the importance of secondary prevention.  Mixed hyperlipidemia Continue statin therapy. Does not endorse myalgias.  Patient was also asked to follow-up with PCP with regards to having a repeat CT scan to reevaluate the progression of pulmonary nodules given her history of smoking/emphysema.  Written instructions provided to the patient and her sister at today's visit.  Would like to repeat an echocardiogram prior to next office visit to reevaluate LVEF and diastolic function.  Patient is reluctant to uptitrate medications at today's office visit.  Will continue to follow.  FINAL MEDICATION LIST END OF ENCOUNTER: No orders of the defined types were placed in this encounter.   Medications Discontinued During This Encounter  Medication Reason   losartan (COZAAR) 25 MG tablet Change in therapy      Current Outpatient Medications:    amLODipine (NORVASC) 5 MG tablet, Take 5 mg by mouth daily., Disp: , Rfl:    atorvastatin (LIPITOR) 10 MG tablet, Take 1 tablet (10 mg total) by mouth daily., Disp: 30 tablet, Rfl: 0   carboxymethylcellulose (REFRESH PLUS) 0.5 % SOLN, Place 1 drop into both eyes 4 (four) times daily as needed (dry eyes)., Disp: , Rfl:    cholecalciferol (VITAMIN D) 25 MCG (1000 UNIT) tablet, Take 1,000 Units by mouth daily., Disp: , Rfl:    ELIQUIS 5 MG TABS  tablet, TAKE ONE TABLET TWICE A DAY., Disp: 60 tablet, Rfl: 2   magnesium oxide (MAG-OX) 400 MG tablet, Take 1 tablet (400 mg total) by mouth daily., Disp: 30 tablet, Rfl: 0   mometasone-formoterol (DULERA) 100-5 MCG/ACT AERO, Inhale 2 puffs into the lungs 2 (two) times daily., Disp: 13 g, Rfl: 0   Multiple Vitamins-Minerals (CERTAVITE/ANTIOXIDANTS) TABS, Take 1 tablet by mouth daily., Disp: 60 tablet, Rfl: 0   omeprazole (PRILOSEC) 20 MG capsule, Take 1 capsule (20 mg total) by mouth daily., Disp: 30 capsule, Rfl: 0   Psyllium (DAILY FIBER PO), Take 160 mg by mouth daily., Disp: , Rfl:    senna-docusate (SENOKOT-S) 8.6-50 MG tablet, Take 2 tablets by mouth 2 (two) times daily., Disp: , Rfl:    umeclidinium bromide (INCRUSE ELLIPTA) 62.5 MCG/ACT AEPB, Inhale 1 puff into the lungs daily., Disp: , Rfl:    losartan (COZAAR) 50 MG tablet, Take 50 mg by mouth daily., Disp: , Rfl:   Orders Placed This Encounter  Procedures   Hemoglobin and hematocrit, blood   Basic metabolic panel   EKG 12-Lead   PCV ECHOCARDIOGRAM COMPLETE    There are no Patient Instructions on file for this visit.   --Continue cardiac medications as reconciled in final medication list. --No follow-ups on file. Or sooner if needed. --Continue follow-up with your primary care physician regarding the management of your other chronic comorbid conditions.  Patient's questions and concerns were addressed to her satisfaction. She voices understanding of the instructions provided during this encounter.   This note was created using a voice recognition software as a result there may be grammatical errors inadvertently enclosed that do not reflect the nature of this encounter. Every attempt is made to correct such errors.   Marvena Tally Odis Hollingshead, DO, [facility]  Pager:  [phone] Office: [phone]

## 2022-06-24 ENCOUNTER — Other Ambulatory Visit: Payer: Self-pay | Admitting: Cardiology

## 2022-06-25 LAB — BASIC METABOLIC PANEL
BUN/Creatinine Ratio: 13 (ref 12–28)
BUN: 13 mg/dL (ref 8–27)
CO2: 23 mmol/L (ref 20–29)
Calcium: 9.5 mg/dL (ref 8.7–10.3)
Chloride: 105 mmol/L (ref 96–106)
Creatinine, Ser: 0.98 mg/dL (ref 0.57–1.00)
Glucose: 93 mg/dL (ref 70–99)
Potassium: 4.2 mmol/L (ref 3.5–5.2)
Sodium: 144 mmol/L (ref 134–144)
eGFR: 59 mL/min/{1.73_m2} — ABNORMAL LOW (ref >59)

## 2022-06-25 LAB — HEMOGLOBIN AND HEMATOCRIT, BLOOD
Hematocrit: 39 % (ref 34.0–46.6)
Hemoglobin: 12.4 g/dL (ref 11.1–15.9)

## 2022-06-25 NOTE — Progress Notes (Signed)
Gave patient results. She acknowledged understanding and had no further questions.

## 2022-07-02 ENCOUNTER — Other Ambulatory Visit: Payer: Self-pay | Admitting: Cardiology

## 2022-07-02 DIAGNOSIS — Z7901 Long term (current) use of anticoagulants: Secondary | ICD-10-CM

## 2022-07-02 DIAGNOSIS — I480 Paroxysmal atrial fibrillation: Secondary | ICD-10-CM

## 2022-07-22 ENCOUNTER — Telehealth: Payer: Self-pay

## 2022-07-22 NOTE — Telephone Encounter (Signed)
Patient's son

## 2022-07-24 ENCOUNTER — Telehealth: Payer: Self-pay

## 2022-07-24 ENCOUNTER — Encounter: Payer: Self-pay | Admitting: Cardiology

## 2022-07-24 ENCOUNTER — Ambulatory Visit: Payer: Medicare Other | Admitting: Cardiology

## 2022-07-24 VITALS — BP 139/69 | HR 76 | Resp 18 | Ht 64.0 in | Wt 188.0 lb

## 2022-07-24 DIAGNOSIS — I10 Essential (primary) hypertension: Secondary | ICD-10-CM

## 2022-07-24 DIAGNOSIS — Z87891 Personal history of nicotine dependence: Secondary | ICD-10-CM

## 2022-07-24 DIAGNOSIS — I6602 Occlusion and stenosis of left middle cerebral artery: Secondary | ICD-10-CM

## 2022-07-24 DIAGNOSIS — Z7901 Long term (current) use of anticoagulants: Secondary | ICD-10-CM

## 2022-07-24 DIAGNOSIS — I5032 Chronic diastolic (congestive) heart failure: Secondary | ICD-10-CM

## 2022-07-24 DIAGNOSIS — I480 Paroxysmal atrial fibrillation: Secondary | ICD-10-CM

## 2022-07-24 DIAGNOSIS — I700 Atherosclerosis of aorta: Secondary | ICD-10-CM

## 2022-07-24 DIAGNOSIS — E782 Mixed hyperlipidemia: Secondary | ICD-10-CM

## 2022-07-24 MED ORDER — DILTIAZEM HCL ER COATED BEADS 120 MG PO CP24: 120.0000 mg | ORAL_CAPSULE | Freq: Every evening | ORAL | 0 refills | Status: DC

## 2022-07-24 MED ORDER — EMPAGLIFLOZIN 10 MG PO TABS: 10.0000 mg | ORAL_TABLET | Freq: Every day | ORAL | 0 refills | Status: DC

## 2022-07-24 NOTE — Patient Instructions (Addendum)
Stop amlodipine.  Start Jardiance 10 mg p.o. every morning.  Start Cardizem 120 mg p.o. every afternoon  Labs in 1 week to evaluate kidney function and electrolytes after starting Jardiance.  Please bring the caridac medication bottles at the next office visit to help facilitate a more accurate medication reconciliation.  Echo prior to the next office visit

## 2022-07-24 NOTE — Progress Notes (Signed)
ID:  Michelle Dean, DOB 09-06-1945, MRN [id number]  PCP:  Fannie Knee, MD  Cardiologist:  Rex Kras, DO, [facility] (established care 02/27/2021)  Date: 07/24/22 Last Office Visit: 04/21/2022  Chief Complaint  Patient presents with   Follow-up    3 month History of paroxysmal atrial fibrillation and heart failure.    HPI  Michelle Dean is a 77 y.o. Caucasian female whose past medical history and cardiovascular risk factors include: Paroxysmal atrial fibrillation, status post cardioversion, long-term oral anticoagulation/antiarrhythmic medications, chronic HFpEF, left MCA stroke, hyperlipidemia, COPD, pulmonary nodules, hypertension, aortic atherosclerosis, advanced age, postmenopausal female.   In October 2022 she presented with acute onset of confusion and expressive/receptive aphasia and underwent TNK and CT of the head which was negative for intracranial bleed.  CTA noted left M2/M3 occlusion and she underwent partial recannulation with reocclusion followed by mechanical thrombolysis achieving TICI 2C revascularization.angiography/thrombectomy.  Postprocedure was found to be in A-fib with RVR and cardiology was consulted during that hospitalization.  During the hospitalization she was started on pharmacological therapy but eventually needed cardioversion x 2 to maintain sinus rhythm.  At follow-up visit amiodarone was discontinued and diltiazem was increased.  However she developed symptomatic bradycardia and went back to the ED for further evaluation and management.  She now presents for 66-monthfollow-up visit.  She is coming by her cousin at today's visit.  Patient provides verbal consent with having her present during today's encounter she also provides collateral history.  Over the last 3 months she is doing well overall but continues to have dyspnea on exertion.  She denies orthopnea, PND, or lower extremity swelling.  Clinically appears to be euvolemic.  EKG illustrates sinus  rhythm.  For reasons unknown she is no longer on diltiazem.  No hospitalizations or urgent care visits for atrial fibrillation with rapid ventricular rate or heart failure exacerbation.  ALLERGIES: Allergies  Allergen Reactions   Aspirin     Other reaction(s): Bleeding   Penicillins Hives and Rash   Lisinopril Cough    MEDICATION LIST PRIOR TO VISIT: Current Meds  Medication Sig   atorvastatin (LIPITOR) 10 MG tablet Take 1 tablet (10 mg total) by mouth daily.   carboxymethylcellulose (REFRESH PLUS) 0.5 % SOLN Place 1 drop into both eyes 4 (four) times daily as needed (dry eyes).   cholecalciferol (VITAMIN D) 25 MCG (1000 UNIT) tablet Take 1,000 Units by mouth daily.   diltiazem (CARDIZEM CD) 120 MG 24 hr capsule Take 1 capsule (120 mg total) by mouth every evening.   ELIQUIS 5 MG TABS tablet TAKE ONE TABLET TWICE A DAY.   empagliflozin (JARDIANCE) 10 MG TABS tablet Take 1 tablet (10 mg total) by mouth daily before breakfast.   losartan (COZAAR) 50 MG tablet Take 50 mg by mouth daily.   magnesium oxide (MAG-OX) 400 MG tablet Take 1 tablet (400 mg total) by mouth daily.   mometasone-formoterol (DULERA) 100-5 MCG/ACT AERO Inhale 2 puffs into the lungs 2 (two) times daily.   Multiple Vitamins-Minerals (CERTAVITE/ANTIOXIDANTS) TABS Take 1 tablet by mouth daily.   omeprazole (PRILOSEC) 20 MG capsule Take 1 capsule (20 mg total) by mouth daily.   Psyllium (DAILY FIBER PO) Take 160 mg by mouth daily.   senna-docusate (SENOKOT-S) 8.6-50 MG tablet Take 2 tablets by mouth 2 (two) times daily.   umeclidinium bromide (INCRUSE ELLIPTA) 62.5 MCG/ACT AEPB Inhale 1 puff into the lungs daily.   [DISCONTINUED] amLODipine (NORVASC) 5 MG tablet Take 5 mg by mouth daily.  PAST MEDICAL HISTORY: Past Medical History:  Diagnosis Date   (HFpEF) heart failure with preserved ejection fraction ([place])    Hyperlipidemia    Hypertension    Paroxysmal atrial fibrillation (HCC)    RBBB    Stroke [facility])      PAST SURGICAL HISTORY: Past Surgical History:  Procedure Laterality Date   BUBBLE STUDY  03/01/2021   Procedure: BUBBLE STUDY;  Surgeon: Rex Kras, DO;  Location: [place];  Service: Cardiovascular;;   CARDIOVERSION N/A 03/01/2021   Procedure: CARDIOVERSION;  Surgeon: Rex Kras, DO;  Location: [place];  Service: Cardiovascular;  Laterality: N/A;   CARDIOVERSION N/A 03/05/2021   Procedure: CARDIOVERSION;  Surgeon: Adrian Prows, MD;  Location: [place];  Service: Cardiovascular;  Laterality: N/A;   IR CT HEAD LTD  02/25/2021   IR PERCUTANEOUS ART THROMBECTOMY/INFUSION INTRACRANIAL INC DIAG ANGIO  02/25/2021   RADIOLOGY WITH ANESTHESIA N/A 02/25/2021   Procedure: IR WITH ANESTHESIA;  Surgeon: Radiologist, Medication, MD;  Location: [place];  Service: Radiology;  Laterality: N/A;   TEE WITHOUT CARDIOVERSION N/A 03/01/2021   Procedure: TRANSESOPHAGEAL ECHOCARDIOGRAM (TEE);  Surgeon: Rex Kras, DO;  Location: [facility];  Service: Cardiovascular;  Laterality: N/A;    FAMILY HISTORY: The patient family history includes Cancer (age of onset: 46) in her mother; Colon cancer in her sister; Heart attack in her mother; Heart disease in her mother; Kidney failure in her father.  SOCIAL HISTORY:  The patient  reports that she quit smoking about 24 years ago. Her smoking use included cigarettes. She has a 27.00 pack-year smoking history. She has never been exposed to tobacco smoke. She has never used smokeless tobacco. She reports that she does not drink alcohol and does not use drugs.  REVIEW OF SYSTEMS: Review of Systems  Constitutional: Negative for chills and fever.  HENT:  Negative for hoarse voice and nosebleeds.        Hard of hearing  Eyes:  Negative for discharge, double vision and pain.  Cardiovascular:  Positive for dyspnea on exertion. Negative for chest pain, claudication, leg swelling, near-syncope, orthopnea, palpitations, paroxysmal nocturnal dyspnea and  syncope.  Respiratory:  Positive for shortness of breath. Negative for hemoptysis.   Musculoskeletal:  Negative for muscle cramps and myalgias.  Gastrointestinal:  Negative for abdominal pain, constipation, diarrhea, hematemesis, hematochezia, melena, nausea and vomiting.  Neurological:  Negative for dizziness and light-headedness.       Subjectively needs to have cognitive impairment   PHYSICAL EXAM:    07/24/2022   11:11 AM 04/21/2022   12:49 PM 09/10/2021    1:28 PM  Vitals with BMI  Height '5\' 4"'$  '5\' 4"'$  '5\' 4"'$   Weight 188 lbs 196 lbs 13 oz 189 lbs  BMI 32.25 [id number]  Systolic [id number]  Diastolic 69 75 79  Pulse 76 76 75   Physical Exam  Constitutional: No distress.  Appears older than stated age, hemodynamically stable.   Neck: No JVD present.  Cardiovascular: Normal rate, regular rhythm, S1 normal, S2 normal, intact distal pulses and normal pulses. Exam reveals no gallop, no S3 and no S4.  No murmur heard. Pulmonary/Chest: Effort normal and breath sounds normal. No stridor. She has no wheezes. She has no rales.  Abdominal: Soft. Bowel sounds are normal. She exhibits no distension. There is no abdominal tenderness.  Musculoskeletal:        General: No edema.     Cervical back: Neck supple.  Neurological: She is alert and oriented to person, place,  and time. She has intact cranial nerves (2-12).  Skin: Skin is warm and moist.   RADIOLOGY: CT PE protocol 04/10/2021 1. No pulmonary embolus or acute aortic syndrome. 2. Hiatal hernia. 3. Multiple pulmonary nodules. Most severe: 10 mm right lower lobe pulmonary nodule. Consider a non-contrast Chest CT at 3 months, aPET/CT, or tissue sampling. These guidelines do not apply to immunocompromised patients and patients with cancer. Follow up in patients with significant comorbidities as clinically warranted. For lung cancer screening, adhere to Lung-RADS guidelines. Reference: Radiology. 2017; 284(1):228-43. 4. Aortic  Atherosclerosis (ICD10-I70.0) and Emphysema (ICD10-J43.9).  CARDIAC DATABASE: EKG: 02/27/2021: Atrial flutter 2:1, 130bpm, right bundle branch block.  No prior EKGs available for comparison. 03/01/2021: Atrial fibrillation with rapid ventricular rate, 105 bpm, right bundle branch block. 07/24/2022: Sinus rhythm, 73 bpm, right bundle branch block.  No significant change compared to 04/21/2022   Echocardiogram: 02/26/2021: 1. Left ventricular ejection fraction, by estimation, is 55 to 60%. The  left ventricle has normal function. The left ventricle has no regional  wall motion abnormalities. Left ventricular diastolic parameters are  consistent with Grade II diastolic dysfunction (pseudonormalization).   2. Right ventricular systolic function is normal. The right ventricular  size is mildly enlarged. Tricuspid regurgitation signal is inadequate for  assessing PA pressure.   3. Left atrial size was mildly dilated.   4. Right atrial size was mildly dilated.   5. The mitral valve is normal in structure. No evidence of mitral valve regurgitation. No evidence of mitral stenosis.   6. The aortic valve is tricuspid. Aortic valve regurgitation is not visualized. Mild aortic valve sclerosis is present, with no evidence of aortic valve stenosis.   7. The inferior vena cava is dilated in size with >50% respiratory variability, suggesting right atrial pressure of 8 mmHg.    TEE guided DDCV:  03/01/2021:  1. Left ventricular ejection fraction, by estimation, is 50 to 55%. The  left ventricle has low normal function. The left ventricle has no regional  wall motion abnormalities.   2. Right ventricular systolic function is normal. The right ventricular  size is mildly enlarged.   3. Left atrial size was mildly dilated. No left atrial/left atrial  appendage thrombus was detected. The LAA emptying velocity was 61 cm/s.   4. Right atrial size was mildly dilated.   5. The mitral valve is normal in  structure. Mild mitral valve  regurgitation. No evidence of mitral stenosis.   6. The aortic valve is normal in structure. Aortic valve regurgitation is  not visualized. No aortic stenosis is present.   7. There is mild (Grade II) plaque involving the descending aorta.   8. Agitated saline contrast bubble study was negative, with no evidence  of any interatrial shunt A successful direct current cardioversion was performed at 100 joules with 1 attempt. Transitioned to normal sinus post direct current cardioversion; however, converted back to atrial fibrillation by the time she was in post-procedural holding.    DCCV: 03/01/2021: 100J x 1 Converted to SR; reverted back to Afib. 03/05/2021: 100J x 1 Converted to SR.   REMOTE PATIENT MONITORING 07/24/2022 Patient's home blood pressure is well controlled on current antihypertensive regimen.    Systolic Blood Pressure      mmHg  --          114.0 (99991111 - [id number]) Diastolic Blood Pressure     mmHg  --          66.8 (44.0 - 96.0) Heart Rate  bpm     --          76.2 (60.0 - 97.0)     07/23/22 6:49 PM                     122      /           73        mmHg  81        bpm      07/23/22 8:50 AM                     126      /           96        mmHg  68        bpm      07/22/22 8:41 AM                     132      /           74        mmHg  71        bpm      07/21/22 9:10 PM                     112      /           67        mmHg  91        bpm      07/21/22 4:38 PM                     116      /           72        mmHg  92        bpm      07/21/22 4:33 PM                     137      /           71        mmHg  80        bpm      07/21/22 2:00 PM                     134      /           72        mmHg  94        bpm      07/21/22 11:01 AM                   140      /           90        mmHg  96        bpm      07/21/22 10:57 AM                   138      /           93        mmHg  94     LABORATORY DATA:    Latest Ref Rng & Units 06/24/2022  10:37 AM 06/15/2021    6:34 PM 04/10/2021   10:23 AM  CBC  WBC 4.0 - 10.5 K/uL  11.9  13.3   Hemoglobin 11.1 - 15.9 g/dL 12.4  11.8  11.3   Hematocrit 34.0 - 46.6 % 39.0  38.4  36.3   Platelets 150 - 400 K/uL  264  381        Latest Ref Rng & Units 06/24/2022   10:37 AM 06/16/2021   11:36 AM 06/15/2021    6:34 PM  CMP  Glucose 70 - 99 mg/dL 93  86  106   BUN 8 - 27 mg/dL '13  15  19   '$ Creatinine 0.57 - 1.00 mg/dL 0.98  1.06  1.22   Sodium 134 - 144 mmol/L 144  142  139   Potassium 3.5 - 5.2 mmol/L 4.2  4.7  4.5   Chloride 96 - 106 mmol/L 105  110  107   CO2 20 - 29 mmol/L '23  23  23   '$ Calcium 8.7 - 10.3 mg/dL 9.5  8.9  8.7     Lipid Panel     Component Value Date/Time   CHOL 131 02/26/2021 0525   TRIG 55 02/26/2021 0525   HDL 57 02/26/2021 0525   CHOLHDL 2.3 02/26/2021 0525   VLDL 11 02/26/2021 0525   LDLCALC 63 02/26/2021 0525    No components found for: "NTPROBNP" No results for input(s): "PROBNP" in the last 8760 hours.  No results for input(s): "TSH" in the last 8760 hours.   BMP Recent Labs    06/24/22 1037  NA 144  K 4.2  CL 105  CO2 23  GLUCOSE 93  BUN 13  CREATININE 0.98  CALCIUM 9.5    HEMOGLOBIN A1C Lab Results  Component Value Date   HGBA1C 5.5 02/26/2021   MPG 111.15 02/26/2021   External Labs: Collected: 03/25/2022 available in Care Everywhere. Hemoglobin 13.3 g/dL, hematocrit 39.3% BUN 12, creatinine 1.02. Sodium 146, potassium 4.3, chloride 106, bicarb 24. AST 16, ALT 8, alkaline phosphatase 78 Total cholesterol 166, triglycerides 74, HDL 72, LDL 80   IMPRESSION:    ICD-10-CM   1. Paroxysmal A-fib (HCC)  I48.0 EKG 12-Lead    diltiazem (CARDIZEM CD) 120 MG 24 hr capsule    2. Long term (current) use of anticoagulants  Z79.01     3. Chronic heart failure with preserved ejection fraction (HFpEF) (HCC)  I50.32 empagliflozin (JARDIANCE) 10 MG TABS tablet    Pro b natriuretic peptide (BNP)    Basic metabolic panel    Magnesium     Hemoglobin and hematocrit, blood    4. 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 Mixed hyperlipidemia  E78.2     6. Benign hypertension  I10     7. Atherosclerosis of aorta (HCC)  I70.0     8. Former smoker  Z87.891        RECOMMENDATIONS: Michelle Dean is a 77 y.o. Caucasian female whose past medical history and cardiac risk factors include: Paroxysmal atrial fibrillation, status post cardioversion, long-term oral anticoagulation/antiarrhythmic medications, chronic HFpEF, left MCA stroke, hyperlipidemia, COPD, pulmonary nodules, hypertension, aortic atherosclerosis, advanced age, postmenopausal female.  Paroxysmal A-fib (HCC) Rate control: Diltiazem Rhythm control: N/A. Thromboembolic prophylaxis: Eliquis. Click Here to Calculate/Change CHADS2VASc Score The patient's CHADS2-VASc score is 8, indicating a 10.8% annual risk of stroke.  Therefore, anticoagulation is recommended.   CHF History: Yes HTN History: Yes Diabetes History: No Stroke History: Yes  Vascular Disease History: Yes History of TEE guided cardioversion. Was on amiodarone but later discontinued secondary to medication profile. For reasons unknown she is no longer on diltiazem based on her medications list that she provides me today.  Will restart her on the original diltiazem 120 mg p.o. daily for rate control strategy  Long term (current) use of anticoagulants Indication: Paroxysmal atrial fibrillation. Does not endorse evidence of bleeding. Independently reviewed labs from 06/24/2022 hemoglobin 12.4 g/dL, back in November 2023 was 13.3 g/dL. No recent falls. Monitor for now. Reemphasized the risks, benefits, alternatives to anticoagulation.  Chronic heart failure with preserved ejection fraction (HFpEF) (HCC) Clinically euvolemic on examination Subjectively continues to have dyspnea on exertion with effort related activities Echo: 02/2021: LVEF [id number], grade 2 diastolic dysfunction, no significant  valvular heart disease, RAP 8 mmHg. Likely secondary to A-fib with RVR. Discontinue amlodipine. Start Jardiance 10 mg p.o. every morning. Labs in 1 week after being on Jardiance to reevaluate kidney function and electrolytes. Remote patient monitoring data independently reviewed during today's encounter.  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 Educated on the importance of secondary prevention.  Mixed hyperlipidemia Continue statin therapy. Does not endorse myalgias.  Written instructions provided to the patient and cousin sister. Advised patient to bring the caridac medication bottles at the next office visit to help facilitate a more accurate medication reconciliation.  FINAL MEDICATION LIST END OF ENCOUNTER: Meds ordered this encounter  Medications   diltiazem (CARDIZEM CD) 120 MG 24 hr capsule    Sig: Take 1 capsule (120 mg total) by mouth every evening.    Dispense:  30 capsule    Refill:  0   empagliflozin (JARDIANCE) 10 MG TABS tablet    Sig: Take 1 tablet (10 mg total) by mouth daily before breakfast.    Dispense:  30 tablet    Refill:  0    Medications Discontinued During This Encounter  Medication Reason   amLODipine (NORVASC) 5 MG tablet Change in therapy      Current Outpatient Medications:    atorvastatin (LIPITOR) 10 MG tablet, Take 1 tablet (10 mg total) by mouth daily., Disp: 30 tablet, Rfl: 0   carboxymethylcellulose (REFRESH PLUS) 0.5 % SOLN, Place 1 drop into both eyes 4 (four) times daily as needed (dry eyes)., Disp: , Rfl:    cholecalciferol (VITAMIN D) 25 MCG (1000 UNIT) tablet, Take 1,000 Units by mouth daily., Disp: , Rfl:    diltiazem (CARDIZEM CD) 120 MG 24 hr capsule, Take 1 capsule (120 mg total) by mouth every evening., Disp: 30 capsule, Rfl: 0   ELIQUIS 5 MG TABS tablet, TAKE ONE TABLET TWICE A DAY., Disp: 60 tablet, Rfl: 2   empagliflozin  (JARDIANCE) 10 MG TABS tablet, Take 1 tablet (10 mg total) by mouth daily before breakfast., Disp: 30 tablet, Rfl: 0   losartan (COZAAR) 50 MG tablet, Take 50 mg by mouth daily., Disp: , Rfl:    magnesium oxide (MAG-OX) 400 MG tablet, Take 1 tablet (400 mg total) by mouth daily., Disp: 30 tablet, Rfl: 0   mometasone-formoterol (DULERA) 100-5 MCG/ACT AERO, Inhale 2 puffs into the lungs 2 (two) times daily., Disp: 13 g, Rfl: 0   Multiple Vitamins-Minerals (CERTAVITE/ANTIOXIDANTS) TABS, Take 1 tablet by mouth daily., Disp: 60 tablet, Rfl: 0   omeprazole (PRILOSEC) 20 MG capsule, Take 1 capsule (20 mg total) by mouth daily., Disp: 30 capsule, Rfl: 0  Psyllium (DAILY FIBER PO), Take 160 mg by mouth daily., Disp: , Rfl:    senna-docusate (SENOKOT-S) 8.6-50 MG tablet, Take 2 tablets by mouth 2 (two) times daily., Disp: , Rfl:    umeclidinium bromide (INCRUSE ELLIPTA) 62.5 MCG/ACT AEPB, Inhale 1 puff into the lungs daily., Disp: , Rfl:   Orders Placed This Encounter  Procedures   Pro b natriuretic peptide (BNP)   Basic metabolic panel   Magnesium   Hemoglobin and hematocrit, blood   EKG 12-Lead    Patient Instructions  Stop amlodipine.  Start Jardiance 10 mg p.o. every morning.  Start Cardizem 120 mg p.o. every afternoon  Labs in 1 week to evaluate kidney function and electrolytes after starting Jardiance.  Please bring the caridac medication bottles at the next office visit to help facilitate a more accurate medication reconciliation.  Echo prior to the next office visit   --Continue cardiac medications as reconciled in final medication list. --Return in about 4 weeks (around 08/21/2022) for Follow up HFpeF, afib. Or sooner if needed. --Continue follow-up with your primary care physician regarding the management of your other chronic comorbid conditions.  Patient's questions and concerns were addressed to her satisfaction. She voices understanding of the instructions provided during this  encounter.   This note was created using a voice recognition software as a result there may be grammatical errors inadvertently enclosed that do not reflect the nature of this encounter. Every attempt is made to correct such errors.   Rex Kras, [place], [facility]  Pager: [phone] Office: [phone]

## 2022-07-24 NOTE — Telephone Encounter (Signed)
Patient's home blood pressure is well controlled on current antihypertensive regimen.   Systolic Blood Pressure mmHg -- 114.0 (99991111 - [id number]) Diastolic Blood Pressure mmHg -- 66.8 (44.0 - 96.0) Heart Rate bpm -- 76.2 (60.0 - 97.0)   07/23/22 6:49 PM  122 / 73 mmHg 81 bpm  07/23/22 8:50 AM  126 / 96 mmHg 68 bpm  07/22/22 8:41 AM  132 / 74 mmHg 71 bpm  07/21/22 9:10 PM  112 / 67 mmHg 91 bpm  07/21/22 4:38 PM  116 / 72 mmHg 92 bpm  07/21/22 4:33 PM  137 / 71 mmHg 80 bpm  07/21/22 2:00 PM  134 / 72 mmHg 94 bpm  07/21/22 11:01 AM  140 / 90 mmHg 96 bpm  07/21/22 10:57 AM  138 / 93 mmHg 94 bpm

## 2022-07-25 ENCOUNTER — Other Ambulatory Visit: Payer: Self-pay

## 2022-07-25 DIAGNOSIS — I5032 Chronic diastolic (congestive) heart failure: Secondary | ICD-10-CM

## 2022-07-31 ENCOUNTER — Ambulatory Visit: Payer: Medicare Other

## 2022-07-31 DIAGNOSIS — I480 Paroxysmal atrial fibrillation: Secondary | ICD-10-CM

## 2022-07-31 DIAGNOSIS — I5032 Chronic diastolic (congestive) heart failure: Secondary | ICD-10-CM

## 2022-08-02 LAB — BASIC METABOLIC PANEL
BUN/Creatinine Ratio: 16 (ref 12–28)
BUN: 19 mg/dL (ref 8–27)
CO2: 22 mmol/L (ref 20–29)
Calcium: 9.3 mg/dL (ref 8.7–10.3)
Chloride: 107 mmol/L — ABNORMAL HIGH (ref 96–106)
Creatinine, Ser: 1.17 mg/dL — ABNORMAL HIGH (ref 0.57–1.00)
Glucose: 93 mg/dL (ref 70–99)
Potassium: 4.4 mmol/L (ref 3.5–5.2)
Sodium: 144 mmol/L (ref 134–144)
eGFR: 48 mL/min/{1.73_m2} — ABNORMAL LOW (ref >59)

## 2022-08-02 LAB — PRO B NATRIURETIC PEPTIDE: NT-Pro BNP: 530 pg/mL (ref 0–738)

## 2022-08-02 LAB — HEMOGLOBIN AND HEMATOCRIT, BLOOD
Hematocrit: 37.8 % (ref 34.0–46.6)
Hemoglobin: 12.4 g/dL (ref 11.1–15.9)

## 2022-08-02 LAB — MAGNESIUM: Magnesium: 2 mg/dL (ref 1.6–2.3)

## 2022-08-07 NOTE — Progress Notes (Signed)
LMTCB

## 2022-08-12 NOTE — Progress Notes (Signed)
Spoke with son. He verbalized understanding.

## 2022-08-14 ENCOUNTER — Telehealth: Payer: Self-pay

## 2022-08-14 ENCOUNTER — Ambulatory Visit: Payer: Medicare Other | Admitting: Cardiology

## 2022-08-14 ENCOUNTER — Encounter: Payer: Self-pay | Admitting: Cardiology

## 2022-08-14 VITALS — BP 142/76 | HR 74 | Ht 64.0 in | Wt 189.0 lb

## 2022-08-14 DIAGNOSIS — I700 Atherosclerosis of aorta: Secondary | ICD-10-CM

## 2022-08-14 DIAGNOSIS — I6602 Occlusion and stenosis of left middle cerebral artery: Secondary | ICD-10-CM

## 2022-08-14 DIAGNOSIS — I5032 Chronic diastolic (congestive) heart failure: Secondary | ICD-10-CM

## 2022-08-14 DIAGNOSIS — I10 Essential (primary) hypertension: Secondary | ICD-10-CM

## 2022-08-14 DIAGNOSIS — E782 Mixed hyperlipidemia: Secondary | ICD-10-CM

## 2022-08-14 DIAGNOSIS — Z7901 Long term (current) use of anticoagulants: Secondary | ICD-10-CM

## 2022-08-14 DIAGNOSIS — I480 Paroxysmal atrial fibrillation: Secondary | ICD-10-CM

## 2022-08-14 DIAGNOSIS — Z87891 Personal history of nicotine dependence: Secondary | ICD-10-CM

## 2022-08-14 MED ORDER — DILTIAZEM HCL ER COATED BEADS 120 MG PO CP24
120.0000 mg | ORAL_CAPSULE | Freq: Every evening | ORAL | 0 refills | Status: DC
Start: 2022-08-14 — End: 2022-11-03

## 2022-08-14 MED ORDER — EMPAGLIFLOZIN 10 MG PO TABS
10.0000 mg | ORAL_TABLET | Freq: Every day | ORAL | 0 refills | Status: DC
Start: 2022-08-14 — End: 2022-12-01

## 2022-08-14 NOTE — Progress Notes (Signed)
ID:  Michelle Dean, DOB [birth year], MRN [id number]  PCP:  Fannie Knee, MD  Cardiologist:  Rex Kras, DO, [facility] (established care 02/27/2021)  Date: 08/14/22 Last Office Visit: 07/24/2022  Chief Complaint  Patient presents with   Atrial Fibrillation   Follow-up   Congestive Heart Failure    HPI  Michelle Dean is a 77 y.o. Caucasian female whose past medical history and cardiovascular risk factors include: Paroxysmal atrial fibrillation, status post cardioversion, long-term oral anticoagulation/antiarrhythmic medications, chronic HFpEF, left MCA stroke, hyperlipidemia, COPD, pulmonary nodules, hypertension, aortic atherosclerosis, advanced age, postmenopausal female.   In October 2022 she presented with acute onset of confusion and expressive/receptive aphasia and underwent TNK and CT of the head which was negative for intracranial bleed.  CTA noted left M2/M3 occlusion and she underwent partial recannulation with reocclusion followed by mechanical thrombolysis achieving TICI 2C revascularization.angiography/thrombectomy.  Postprocedure was found to be in A-fib with RVR and cardiology was consulted during that hospitalization.  During the hospitalization she was started on pharmacological therapy but eventually needed cardioversion x 2 to maintain sinus rhythm.  Amiodarone was discontinued in the past due to medication profile.  Since then she been focused on rate control strategy with diltiazem.  Medication was restarted at the last office visit and she has done well with rate control.  With regards to her HFpEF amlodipine was discontinued at the last office visit and Jardiance was initiated.  She is tolerated the medication change well and feels way more energetic than she did in the past.  Repeat labs also notes stable renal function.  Patient states that Eliquis, Vania Rea are becoming cost prohibitive and requesting further assistance.  Shortness of breath with effort related  activities still present but improving.  She denies orthopnea, PND, or lower extremity swelling.  EKG today illustrates sinus rhythm with right bundle branch block.  ALLERGIES: Allergies  Allergen Reactions   Aspirin     Other reaction(s): Bleeding   Penicillins Hives and Rash   Lisinopril Cough    MEDICATION LIST PRIOR TO VISIT: Current Meds  Medication Sig   atorvastatin (LIPITOR) 10 MG tablet Take 1 tablet (10 mg total) by mouth daily.   carboxymethylcellulose (REFRESH PLUS) 0.5 % SOLN Place 1 drop into both eyes 4 (four) times daily as needed (dry eyes).   cholecalciferol (VITAMIN D) 25 MCG (1000 UNIT) tablet Take 2,000 Units by mouth daily.   ELIQUIS 5 MG TABS tablet TAKE ONE TABLET TWICE A DAY.   losartan (COZAAR) 50 MG tablet Take 50 mg by mouth daily.   magnesium oxide (MAG-OX) 400 MG tablet Take 1 tablet (400 mg total) by mouth daily.   mometasone-formoterol (DULERA) 100-5 MCG/ACT AERO Inhale 2 puffs into the lungs 2 (two) times daily.   Multiple Vitamins-Minerals (CERTAVITE/ANTIOXIDANTS) TABS Take 1 tablet by mouth daily.   omeprazole (PRILOSEC) 20 MG capsule Take 1 capsule (20 mg total) by mouth daily.   Psyllium (DAILY FIBER PO) Take 160 mg by mouth daily.   senna-docusate (SENOKOT-S) 8.6-50 MG tablet Take 2 tablets by mouth 2 (two) times daily.   umeclidinium bromide (INCRUSE ELLIPTA) 62.5 MCG/ACT AEPB Inhale 1 puff into the lungs daily.   [DISCONTINUED] diltiazem (CARDIZEM CD) 120 MG 24 hr capsule Take 1 capsule (120 mg total) by mouth every evening.   [DISCONTINUED] empagliflozin (JARDIANCE) 10 MG TABS tablet Take 1 tablet (10 mg total) by mouth daily before breakfast.     PAST MEDICAL HISTORY: Past Medical History:  Diagnosis Date   (  HFpEF) heart failure with preserved ejection fraction    Hyperlipidemia    Hypertension    Paroxysmal atrial fibrillation    RBBB    Stroke     PAST SURGICAL HISTORY: Past Surgical History:  Procedure Laterality Date   BUBBLE  STUDY  03/01/2021   Procedure: BUBBLE STUDY;  Surgeon: Rex Kras, DO;  Location: [place];  Service: Cardiovascular;;   CARDIOVERSION N/A 03/01/2021   Procedure: CARDIOVERSION;  Surgeon: Rex Kras, DO;  Location: [place];  Service: Cardiovascular;  Laterality: N/A;   CARDIOVERSION N/A 03/05/2021   Procedure: CARDIOVERSION;  Surgeon: Adrian Prows, MD;  Location: [place];  Service: Cardiovascular;  Laterality: N/A;   IR CT HEAD LTD  02/25/2021   IR PERCUTANEOUS ART THROMBECTOMY/INFUSION INTRACRANIAL INC DIAG ANGIO  02/25/2021   RADIOLOGY WITH ANESTHESIA N/A 02/25/2021   Procedure: IR WITH ANESTHESIA;  Surgeon: Radiologist, Medication, MD;  Location: [place];  Service: Radiology;  Laterality: N/A;   TEE WITHOUT CARDIOVERSION N/A 03/01/2021   Procedure: TRANSESOPHAGEAL ECHOCARDIOGRAM (TEE);  Surgeon: Rex Kras, DO;  Location: [facility];  Service: Cardiovascular;  Laterality: N/A;    FAMILY HISTORY: The patient family history includes Cancer (age of onset: 57) in her mother; Colon cancer in her sister; Heart attack in her mother; Heart disease in her mother; Kidney failure in her father.  SOCIAL HISTORY:  The patient  reports that she quit smoking about 24 years ago. Her smoking use included cigarettes. She has a 27.00 pack-year smoking history. She has never been exposed to tobacco smoke. She has never used smokeless tobacco. She reports that she does not drink alcohol and does not use drugs.  REVIEW OF SYSTEMS: Review of Systems  Constitutional: Negative for chills and fever.  HENT:  Negative for hoarse voice and nosebleeds.        Hard of hearing  Eyes:  Negative for discharge, double vision and pain.  Cardiovascular:  Positive for dyspnea on exertion (Improving). Negative for chest pain, claudication, leg swelling, near-syncope, orthopnea, palpitations, paroxysmal nocturnal dyspnea and syncope.  Respiratory:  Positive for shortness of breath. Negative for hemoptysis.    Musculoskeletal:  Negative for muscle cramps and myalgias.  Gastrointestinal:  Negative for abdominal pain, constipation, diarrhea, hematemesis, hematochezia, melena, nausea and vomiting.  Neurological:  Negative for dizziness and light-headedness.       Subjectively needs to have cognitive impairment   PHYSICAL EXAM:    08/14/2022   11:41 AM 07/24/2022   11:11 AM 04/21/2022   12:49 PM  Vitals with BMI  Height 5\' 4"  5\' 4"  5\' 4"   Weight 189 lbs 188 lbs 196 lbs 13 oz  BMI 32.43 [id number]  Systolic [id number]  Diastolic 76 69 75  Pulse 74 76 76   Physical Exam  Constitutional: No distress.  Appears older than stated age, hemodynamically stable.   Neck: No JVD present.  Cardiovascular: Normal rate, regular rhythm, S1 normal, S2 normal, intact distal pulses and normal pulses. Exam reveals no gallop, no S3 and no S4.  No murmur heard. Pulmonary/Chest: Effort normal and breath sounds normal. No stridor. She has no wheezes. She has no rales.  Abdominal: Soft. Bowel sounds are normal. She exhibits no distension. There is no abdominal tenderness.  Musculoskeletal:        General: No edema.     Cervical back: Neck supple.  Neurological: She is alert and oriented to person, place, and time. She has intact cranial nerves (2-12).  Skin: Skin is warm and  moist.   RADIOLOGY: CT PE protocol 04/10/2021 1. No pulmonary embolus or acute aortic syndrome. 2. Hiatal hernia. 3. Multiple pulmonary nodules. Most severe: 10 mm right lower lobe pulmonary nodule. Consider a non-contrast Chest CT at 3 months, aPET/CT, or tissue sampling. These guidelines do not apply to immunocompromised patients and patients with cancer. Follow up in patients with significant comorbidities as clinically warranted. For lung cancer screening, adhere to Lung-RADS guidelines. Reference: Radiology. 2017; 284(1):228-43. 4. Aortic Atherosclerosis (ICD10-I70.0) and Emphysema (ICD10-J43.9).  CARDIAC DATABASE: EKG: 02/27/2021:  Atrial flutter 2:1, 130bpm, right bundle branch block.  No prior EKGs available for comparison. 03/01/2021: Atrial fibrillation with rapid ventricular rate, 105 bpm, right bundle branch block. 07/24/2022: Sinus rhythm, 73 bpm, right bundle branch block.  No significant change compared to 04/21/2022 August 14, 2022: Sinus rhythm, 69 bpm, right bundle branch block.   Echocardiogram: 02/26/2021: LVEF 0000000, grade 2 diastolic dysfunction, biatrial enlargement, estimated RAP 8 mmHg, see report for additional details  07/31/2022:  Normal LV systolic function with visual EF 60-65%. Left ventricle cavity is normal in size. Mild concentric hypertrophy of the left ventricle. Normal global wall motion. Doppler evidence of grade II diastolic dysfunction, elevated LAP. Calculated EF 67%.  Left atrial cavity is slightly dilated.  Structurally normal tricuspid valve.  Mild tricuspid regurgitation. No evidence of pulmonary hypertension.  No prior available for comparison.    TEE guided DDCV:  03/01/2021:  1. Left ventricular ejection fraction, by estimation, is 50 to 55%. The  left ventricle has low normal function. The left ventricle has no regional  wall motion abnormalities.   2. Right ventricular systolic function is normal. The right ventricular  size is mildly enlarged.   3. Left atrial size was mildly dilated. No left atrial/left atrial  appendage thrombus was detected. The LAA emptying velocity was 61 cm/s.   4. Right atrial size was mildly dilated.   5. The mitral valve is normal in structure. Mild mitral valve  regurgitation. No evidence of mitral stenosis.   6. The aortic valve is normal in structure. Aortic valve regurgitation is  not visualized. No aortic stenosis is present.   7. There is mild (Grade II) plaque involving the descending aorta.   8. Agitated saline contrast bubble study was negative, with no evidence  of any interatrial shunt A successful direct current cardioversion was  performed at 100 joules with 1 attempt. Transitioned to normal sinus post direct current cardioversion; however, converted back to atrial fibrillation by the time she was in post-procedural holding.    DCCV: 03/01/2021: 100J x 1 Converted to SR; reverted back to Afib. 03/05/2021: 100J x 1 Converted to SR.   REMOTE PATIENT MONITORING  Patient home blood pressure is controlled overall but does fluctuate. March 30-d average was 120/70.   Blood pressure and heart rate 08/13/22 7:36 PM           129      /           70        70                                 08/12/22 1:08 PM           109      /           63        69  08/11/22 3:19 PM           111      /           61        77                                 08/10/22 2:19 PM         127      /           73        74                                 08/09/22 9:53 AM         133      /           76        70                                 08/08/22 4:35 PM         118      /           66        77                                 08/07/22 2:03 PM         123      /           73        69                                 08/07/22 12:23 PM       97        /           59        62                                 08/07/22 12:22 PM       94        /           53        59                                 08/07/22 10:15 AM       149      /           94        72      LABORATORY DATA:    Latest Ref Rng & Units 08/01/2022   11:15 AM 06/24/2022   10:37 AM 06/15/2021    6:34 PM  CBC  WBC 4.0 - 10.5 K/uL   11.9   Hemoglobin 11.1 - 15.9 g/dL 12.4  12.4  11.8   Hematocrit 34.0 - 46.6 % 37.8  39.0  38.4   Platelets 150 - 400 K/uL   264  Latest Ref Rng & Units 08/01/2022   11:15 AM 06/24/2022   10:37 AM 06/16/2021   11:36 AM  CMP  Glucose 70 - 99 mg/dL 93  93  86   BUN 8 - 27 mg/dL 19  13  15    Creatinine 0.57 - 1.00 mg/dL 1.17  0.98  1.06   Sodium 134 - 144 mmol/L 144  144  142   Potassium 3.5 - 5.2 mmol/L 4.4  4.2  4.7    Chloride 96 - 106 mmol/L 107  105  110   CO2 20 - 29 mmol/L 22  23  23    Calcium 8.7 - 10.3 mg/dL 9.3  9.5  8.9     Lipid Panel     Component Value Date/Time   CHOL 131 02/26/2021 0525   TRIG 55 02/26/2021 0525   HDL 57 02/26/2021 0525   CHOLHDL 2.3 02/26/2021 0525   VLDL 11 02/26/2021 0525   LDLCALC 63 02/26/2021 0525    No components found for: "NTPROBNP" Recent Labs    08/01/22 1115  PROBNP 530    No results for input(s): "TSH" in the last 8760 hours.   BMP Recent Labs    06/24/22 1037 08/01/22 1115  NA 144 144  K 4.2 4.4  CL 105 107*  CO2 23 22  GLUCOSE 93 93  BUN 13 19  CREATININE 0.98 1.17*  CALCIUM 9.5 9.3    HEMOGLOBIN A1C Lab Results  Component Value Date   HGBA1C 5.5 02/26/2021   MPG 111.15 02/26/2021   External Labs: Collected: 03/25/2022 available in Care Everywhere. Hemoglobin 13.3 g/dL, hematocrit 39.3% BUN 12, creatinine 1.02. Sodium 146, potassium 4.3, chloride 106, bicarb 24. AST 16, ALT 8, alkaline phosphatase 78 Total cholesterol 166, triglycerides 74, HDL 72, LDL 80   IMPRESSION:    ICD-10-CM   1. Paroxysmal A-fib  I48.0 EKG 12-Lead    diltiazem (CARDIZEM CD) 120 MG 24 hr capsule    2. Long term (current) use of anticoagulants  Z79.01     3. Chronic heart failure with preserved ejection fraction (HFpEF)  I50.32 empagliflozin (JARDIANCE) 10 MG TABS tablet    4. 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 Mixed hyperlipidemia  E78.2     6. Benign hypertension  I10     7. Atherosclerosis of aorta  I70.0     8. Former smoker  Z87.891        RECOMMENDATIONS: Michelle Dean is a 77 y.o. Caucasian female whose past medical history and cardiac risk factors include: Paroxysmal atrial fibrillation, status post cardioversion, long-term oral anticoagulation/antiarrhythmic medications, chronic HFpEF, left MCA stroke, hyperlipidemia, COPD, pulmonary nodules, hypertension, aortic atherosclerosis, advanced age,  postmenopausal female.  Paroxysmal A-fib (HCC) Rate control: Diltiazem Rhythm control: N/A. Thromboembolic prophylaxis: Eliquis. Click Here to Calculate/Change CHADS2VASc Score The patient's CHADS2-VASc score is 8, indicating a 10.8% annual risk of stroke.  Therefore, anticoagulation is recommended.   CHF History: Yes HTN History: Yes Diabetes History: No Stroke History: Yes Vascular Disease History: Yes History of TEE guided cardioversion. Was on amiodarone but later discontinued secondary to medication profile. Tolerated reinitiation of diltiazem 120 mg p.o. daily.  Refill provided Monitor for now  Long term (current) use of anticoagulants Indication: Paroxysmal atrial fibrillation. Does not endorse evidence of bleeding. Independently reviewed labs from March 2024, hemoglobin 12.4 g/dL No recent falls. Monitor for now. Reemphasized the risks, benefits, alternatives to anticoagulation. Samples of Eliquis provided. Patient assistance form for Eliquis provided as  well.  Chronic heart failure with preserved ejection fraction (HFpEF) ([place]) Clinically euvolemic on examination Less short of breath with effort related activities after the initiation of Jardiance. Echo: 02/2021: LVEF 0000000, grade 2 diastolic dysfunction, no significant valvular heart disease, RAP 8 mmHg. Likely secondary to A-fib with RVR. Continue Jardiance 10 mg p.o. every morning.  Samples provided.  Patient assistance forms provided as well. Most recent echocardiogram from March 2024 independently reviewed -continues to have grade 2 diastolic dysfunction but right-sided filling pressures have improved when compared to prior studies. Ambulatory blood pressure monitoring results reviewed. At the next office visit would like to transition her from [facility]B to [place].  Not making a change during today's visit as her current medical regimen is becoming cost prohibitive.  Middle cerebral artery embolism, left Likely  cardioembolic in the setting of newly diagnosed atrial fibrillation. Educated on the importance of secondary prevention.  Mixed hyperlipidemia Continue statin therapy. Does not endorse myalgias.  FINAL MEDICATION LIST END OF ENCOUNTER: Meds ordered this encounter  Medications   empagliflozin (JARDIANCE) 10 MG TABS tablet    Sig: Take 1 tablet (10 mg total) by mouth daily before breakfast.    Dispense:  90 tablet    Refill:  0   diltiazem (CARDIZEM CD) 120 MG 24 hr capsule    Sig: Take 1 capsule (120 mg total) by mouth every evening.    Dispense:  90 capsule    Refill:  0    Medications Discontinued During This Encounter  Medication Reason   diltiazem (CARDIZEM CD) 120 MG 24 hr capsule Reorder   empagliflozin (JARDIANCE) 10 MG TABS tablet Reorder       Current Outpatient Medications:    atorvastatin (LIPITOR) 10 MG tablet, Take 1 tablet (10 mg total) by mouth daily., Disp: 30 tablet, Rfl: 0   carboxymethylcellulose (REFRESH PLUS) 0.5 % SOLN, Place 1 drop into both eyes 4 (four) times daily as needed (dry eyes)., Disp: , Rfl:    cholecalciferol (VITAMIN D) 25 MCG (1000 UNIT) tablet, Take 2,000 Units by mouth daily., Disp: , Rfl:    ELIQUIS 5 MG TABS tablet, TAKE ONE TABLET TWICE A DAY., Disp: 60 tablet, Rfl: 2   losartan (COZAAR) 50 MG tablet, Take 50 mg by mouth daily., Disp: , Rfl:    magnesium oxide (MAG-OX) 400 MG tablet, Take 1 tablet (400 mg total) by mouth daily., Disp: 30 tablet, Rfl: 0   mometasone-formoterol (DULERA) 100-5 MCG/ACT AERO, Inhale 2 puffs into the lungs 2 (two) times daily., Disp: 13 g, Rfl: 0   Multiple Vitamins-Minerals (CERTAVITE/ANTIOXIDANTS) TABS, Take 1 tablet by mouth daily., Disp: 60 tablet, Rfl: 0   omeprazole (PRILOSEC) 20 MG capsule, Take 1 capsule (20 mg total) by mouth daily., Disp: 30 capsule, Rfl: 0   Psyllium (DAILY FIBER PO), Take 160 mg by mouth daily., Disp: , Rfl:    senna-docusate (SENOKOT-S) 8.6-50 MG tablet, Take 2 tablets by mouth 2  (two) times daily., Disp: , Rfl:    umeclidinium bromide (INCRUSE ELLIPTA) 62.5 MCG/ACT AEPB, Inhale 1 puff into the lungs daily., Disp: , Rfl:    diltiazem (CARDIZEM CD) 120 MG 24 hr capsule, Take 1 capsule (120 mg total) by mouth every evening., Disp: 90 capsule, Rfl: 0   empagliflozin (JARDIANCE) 10 MG TABS tablet, Take 1 tablet (10 mg total) by mouth daily before breakfast., Disp: 90 tablet, Rfl: 0  Orders Placed This Encounter  Procedures   EKG 12-Lead    There are no Patient Instructions  on file for this visit.   --Continue cardiac medications as reconciled in final medication list. --Return in about 3 months (around 11/13/2022) for Follow up, A. fib, heart failure management.. Or sooner if needed. --Continue follow-up with your primary care physician regarding the management of your other chronic comorbid conditions.  Patient's questions and concerns were addressed to her satisfaction. She voices understanding of the instructions provided during this encounter.   This note was created using a voice recognition software as a result there may be grammatical errors inadvertently enclosed that do not reflect the nature of this encounter. Every attempt is made to correct such errors.  Rex Kras, [place], [facility]  Pager:  [phone] Office: [phone]

## 2022-08-14 NOTE — Telephone Encounter (Signed)
Patient home blood pressure is controlled overall but does fluctuate. March 30-d average was 120/70.  Blood pressure and heart rate 08/13/22 7:36 PM 129 / 70 70     08/12/22 1:08 PM 109 / 63 69     08/11/22 3:19 PM 111 / 61 77     08/10/22 2:19 PM 127 / 73 74     08/09/22 9:53 AM 133 / 76 70     08/08/22 4:35 PM 118 / 66 77     08/07/22 2:03 PM 123 / 73 69     08/07/22 12:23 PM 97 / 59 62     08/07/22 12:22 PM 94 / 53 59     08/07/22 10:15 AM 149 / 94 72

## 2022-09-05 NOTE — Progress Notes (Signed)
Averages January 2024 BP DATA Average Systolic BP Level 114.63 mmHg Lowest Systolic BP Level 90 mmHg Highest Systolic BP Level 137 mmHg  Feb 2024 Systolic Blood Pressure 112.5 (91.0 - 130.0) Diastolic Blood Pressure 64.7 (16.1 - 79.0) Heart Rate bpm -- 72.7 (63.0 - 85.0)  Mar 2024 Systolic Blood Pressure 120.1 (85.0 - 154.0) Diastolic Blood Pressure 70.7 (09.6 - 96.0) Heart Rate 74.0 (58.0 - 97.0)  April 2024 Average BP 116/68 mmHg Average HR 69 bpm  Date Systolic Diastolic Units Heart Rate Units 06/14/22 12:25 PM 117 71 mmHg 85 bpm 06/15/22 11:51 AM 119 68 mmHg 66 bpm 06/16/22 3:01 PM 124 67 mmHg 70 bpm 06/17/22 12:46 PM 106 62 mmHg 77 bpm 06/18/22 4:05 PM 102 52 mmHg 69 bpm 06/20/22 6:46 PM 122 70 mmHg 64 bpm 06/21/22 2:57 PM 91 44 mmHg 73 bpm 06/21/22 2:58 PM 91 46 mmHg 75 bpm 06/21/22 5:02 PM 110 59 mmHg 73 bpm 06/22/22 12:05 PM 111 66 mmHg 76 bpm 06/23/22 12:08 PM 113 61 mmHg 71 bpm 06/24/22 3:39 PM 127 69 mmHg 69 bpm 06/25/22 10:58 AM 93 60 mmHg 79 bpm 06/25/22 4:46 PM 106 71 mmHg 83 bpm 06/27/22 3:34 PM 127 74 mmHg 76 bpm 06/28/22 1:16 PM 130 79 mmHg 73 bpm 06/28/22 3:11 PM 115 66 mmHg 69 bpm 06/30/22 2:25 PM 124 72 mmHg 63 bpm 07/01/22 11:32 AM 119 77 mmHg 69 bpm 07/02/22 10:27 AM 118 67 mmHg 75 bpm 07/03/22 3:01 PM 122 68 mmHg 73 bpm 07/04/22 4:03 PM 106 65 mmHg 84 bpm 07/05/22 4:06 PM 105 57 mmHg 69 bpm 07/06/22 6:22 PM 110 69 mmHg 72 bpm 07/07/22 2:17 PM 106 62 mmHg 65 bpm 07/08/22 12:48 PM 112 61 mmHg 71 bpm 07/09/22 9:08 PM 119 72 mmHg 66 bpm 07/10/22 4:02 PM 108 63 mmHg 67 bpm 07/11/22 1:29 PM 117 65 mmHg 68 bpm 07/12/22 1:32 PM 106 69 mmHg 74 bpm 07/13/22 12:59 PM 105 60 mmHg 78 bpm 07/14/22 12:40 PM 94 55 mmHg 71 bpm 07/14/22 12:42 PM 96 54 mmHg 69 bpm 07/14/22 8:53 PM 107 63 mmHg 70 bpm 07/16/22 6:58 PM 107 55 mmHg 85 bpm 07/17/22 12:47 PM 121 67 mmHg 71 bpm 07/18/22 12:37 PM 85 54 mmHg 67 bpm 07/18/22 12:38 PM 95 55 mmHg 60 bpm 07/18/22 1:40 PM 120 75 mmHg 93 bpm 07/19/22 4:10  PM 94 59 mmHg 88 bpm 07/19/22 4:19 PM 98 53 mmHg 69 bpm 07/19/22 4:26 PM 96 48 mmHg 84 bpm 07/19/22 4:34 PM 98 57 mmHg 66 bpm 07/19/22 4:35 PM 101 52 mmHg 92 bpm 07/19/22 4:37 PM 91 49 mmHg 62 bpm 07/19/22 4:39 PM 98 54 mmHg 80 bpm 07/19/22 5:31 PM 119 77 mmHg 76 bpm 07/19/22 5:34 PM 112 72 mmHg 82 bpm 07/19/22 7:19 PM 121 64 mmHg 64 bpm 07/19/22 7:30 PM 124 64 mmHg 77 bpm 07/20/22 8:47 AM 132 80 mmHg 83 bpm 07/20/22 10:21 AM 136 88 mmHg 71 bpm 07/20/22 12:07 PM 117 73 mmHg 83 bpm 07/20/22 6:20 PM 122 72 mmHg 97 bpm 07/21/22 9:04 AM 145 94 mmHg 92 bpm 07/21/22 9:18 AM 141 87 mmHg 94 bpm 07/21/22 10:57 AM 138 93 mmHg 94 bpm 07/21/22 11:01 AM 140 90 mmHg 96 bpm 07/21/22 2:00 PM 134 72 mmHg 94 bpm 07/21/22 4:33 PM 137 71 mmHg 80 bpm 07/21/22 4:38 PM 116 72 mmHg 92 bpm 07/21/22 9:10 PM 112 67 mmHg 91 bpm 07/22/22 8:41 AM 132 74 mmHg 71 bpm 07/23/22 8:50 AM 126 96  mmHg 68 bpm 07/23/22 6:49 PM 122 73 mmHg 81 bpm 07/24/22 8:44 AM 136 82 mmHg 71 bpm 07/24/22 10:18 PM 122 73 mmHg 74 bpm 07/25/22 6:49 PM 119 70 mmHg 68 bpm 07/26/22 9:08 AM 119 82 mmHg 66 bpm 07/27/22 8:53 AM 140 81 mmHg 69 bpm 07/27/22 9:06 AM 143 81 mmHg 67 bpm 07/27/22 10:16 AM 141 81 mmHg 72 bpm 07/27/22 11:21 AM 141 80 mmHg 68 bpm 07/27/22 1:03 PM 141 86 mmHg 67 bpm 07/27/22 1:29 PM 128 74 mmHg 66 bpm 07/27/22 6:25 PM 126 77 mmHg 64 bpm 07/28/22 8:43 AM 151 79 mmHg 67 bpm 07/28/22 8:45 AM 151 82 mmHg 65 bpm 07/28/22 9:32 AM 153 92 mmHg 69 bpm 07/28/22 10:03 AM 154 88 mmHg 73 bpm 07/28/22 11:18 AM 141 91 mmHg 80 bpm 07/28/22 6:36 PM 109 73 mmHg 66 bpm 07/29/22 8:43 AM 140 74 mmHg 63 bpm 07/29/22 2:55 PM 130 77 mmHg 66 bpm 07/30/22 10:19 AM 124 85 mmHg 67 bpm 07/31/22 8:16 AM 139 91 mmHg 69 bpm 07/31/22 8:19 AM 136 79 mmHg 67 bpm 08/01/22 9:24 AM 131 82 mmHg 72 bpm 08/02/22 10:11 AM 143 87 mmHg 73 bpm 08/03/22 1:12 PM 102 57 mmHg 58 bpm 08/03/22 5:54 PM 131 70 mmHg 73 bpm 08/05/22 10:12 AM 146 87 mmHg 73 bpm 08/06/22 12:24 PM 116 76 mmHg 68 bpm 08/07/22 10:15  AM 149 94 mmHg 72 bpm 08/07/22 12:22 PM 94 53 mmHg 59 bpm 08/07/22 12:23 PM 97 59 mmHg 62 bpm 08/07/22 2:03 PM 123 73 mmHg 69 bpm 08/08/22 4:35 PM 118 66 mmHg 77 bpm 08/09/22 9:53 AM 133 76 mmHg 70 bpm 08/10/22 2:19 PM 127 73 mmHg 74 bpm 08/11/22 3:19 PM 111 61 mmHg 77 bpm 08/12/22 1:08 PM 109 63 mmHg 69 bpm 08/13/22 7:36 PM 129 70 mmHg 70 bpm 08/15/22 12:34 PM 124 69 mmHg 64 bpm 08/16/22 3:40 PM 114 65 mmHg 64 bpm 08/16/22 6:53 PM 113 64 mmHg 65 bpm 08/17/22 4:03 PM 102 62 mmHg 68 bpm 08/19/22 6:47 PM 117 66 mmHg 62 bpm 08/20/22 10:44 AM 134 81 mmHg 70 bpm 08/21/22 5:17 PM 111 68 mmHg 65 bpm 08/22/22 5:13 PM 124 72 mmHg 69 bpm 08/23/22 12:13 PM 128 78 mmHg 67 bpm 08/24/22 3:31 PM 102 63 mmHg 65 bpm 08/25/22 11:38 AM 123 69 mmHg 71 bpm 08/26/22 4:20 PM 123 74 mmHg 64 bpm 08/27/22 5:23 PM 102 58 mmHg 77 bpm 08/28/22 9:28 PM 122 71 mmHg 81 bpm 08/29/22 4:43 PM 117 64 mmHg 71 bpm 08/31/22 8:45 PM 119 72 mmHg 71 bpm 09/01/22 2:37 PM 121 84 mmHg 73 bpm 09/02/22 9:39 PM 110 61 mmHg 67 bpm 09/02/22 9:41 PM 113 63 mmHg 66 bpm 09/04/22 4:24 PM 104 65 mmHg 68 bpm 09/04/22 7:21 PM 113 64 mmHg 73 bpm

## 2022-10-01 ENCOUNTER — Other Ambulatory Visit: Payer: Self-pay | Admitting: Cardiology

## 2022-10-01 DIAGNOSIS — Z7901 Long term (current) use of anticoagulants: Secondary | ICD-10-CM

## 2022-10-01 DIAGNOSIS — I480 Paroxysmal atrial fibrillation: Secondary | ICD-10-CM

## 2022-10-27 ENCOUNTER — Other Ambulatory Visit: Payer: Medicare Other

## 2022-11-02 ENCOUNTER — Other Ambulatory Visit: Payer: Self-pay | Admitting: Cardiology

## 2022-11-02 DIAGNOSIS — I480 Paroxysmal atrial fibrillation: Secondary | ICD-10-CM

## 2022-11-04 ENCOUNTER — Ambulatory Visit: Payer: Medicare Other | Admitting: Cardiology

## 2022-11-17 ENCOUNTER — Ambulatory Visit: Payer: Medicare Other | Admitting: Cardiology

## 2022-11-27 ENCOUNTER — Ambulatory Visit: Payer: Medicare Other | Admitting: Cardiology

## 2022-12-01 ENCOUNTER — Other Ambulatory Visit: Payer: Self-pay | Admitting: Cardiology

## 2022-12-01 DIAGNOSIS — I5032 Chronic diastolic (congestive) heart failure: Secondary | ICD-10-CM

## 2022-12-31 ENCOUNTER — Other Ambulatory Visit: Payer: Self-pay | Admitting: Cardiology

## 2022-12-31 DIAGNOSIS — I480 Paroxysmal atrial fibrillation: Secondary | ICD-10-CM

## 2022-12-31 DIAGNOSIS — Z7901 Long term (current) use of anticoagulants: Secondary | ICD-10-CM

## 2023-01-28 ENCOUNTER — Other Ambulatory Visit: Payer: Self-pay | Admitting: Cardiology

## 2023-01-28 DIAGNOSIS — I480 Paroxysmal atrial fibrillation: Secondary | ICD-10-CM

## 2023-03-01 ENCOUNTER — Other Ambulatory Visit: Payer: Self-pay | Admitting: Cardiology

## 2023-03-01 DIAGNOSIS — I5032 Chronic diastolic (congestive) heart failure: Secondary | ICD-10-CM

## 2023-03-08 IMAGING — DX DG CHEST 1V PORT
1 series · 1 of 1 positions shown · non-contrast
Comparison: Chest x-rays dated 04/10/2021 and 03/02/2021. Chest CT
dated 04/10/2021.

CLINICAL DATA: Chest pain, bradycardia

EXAM:
PORTABLE CHEST 1 VIEW

[chest]
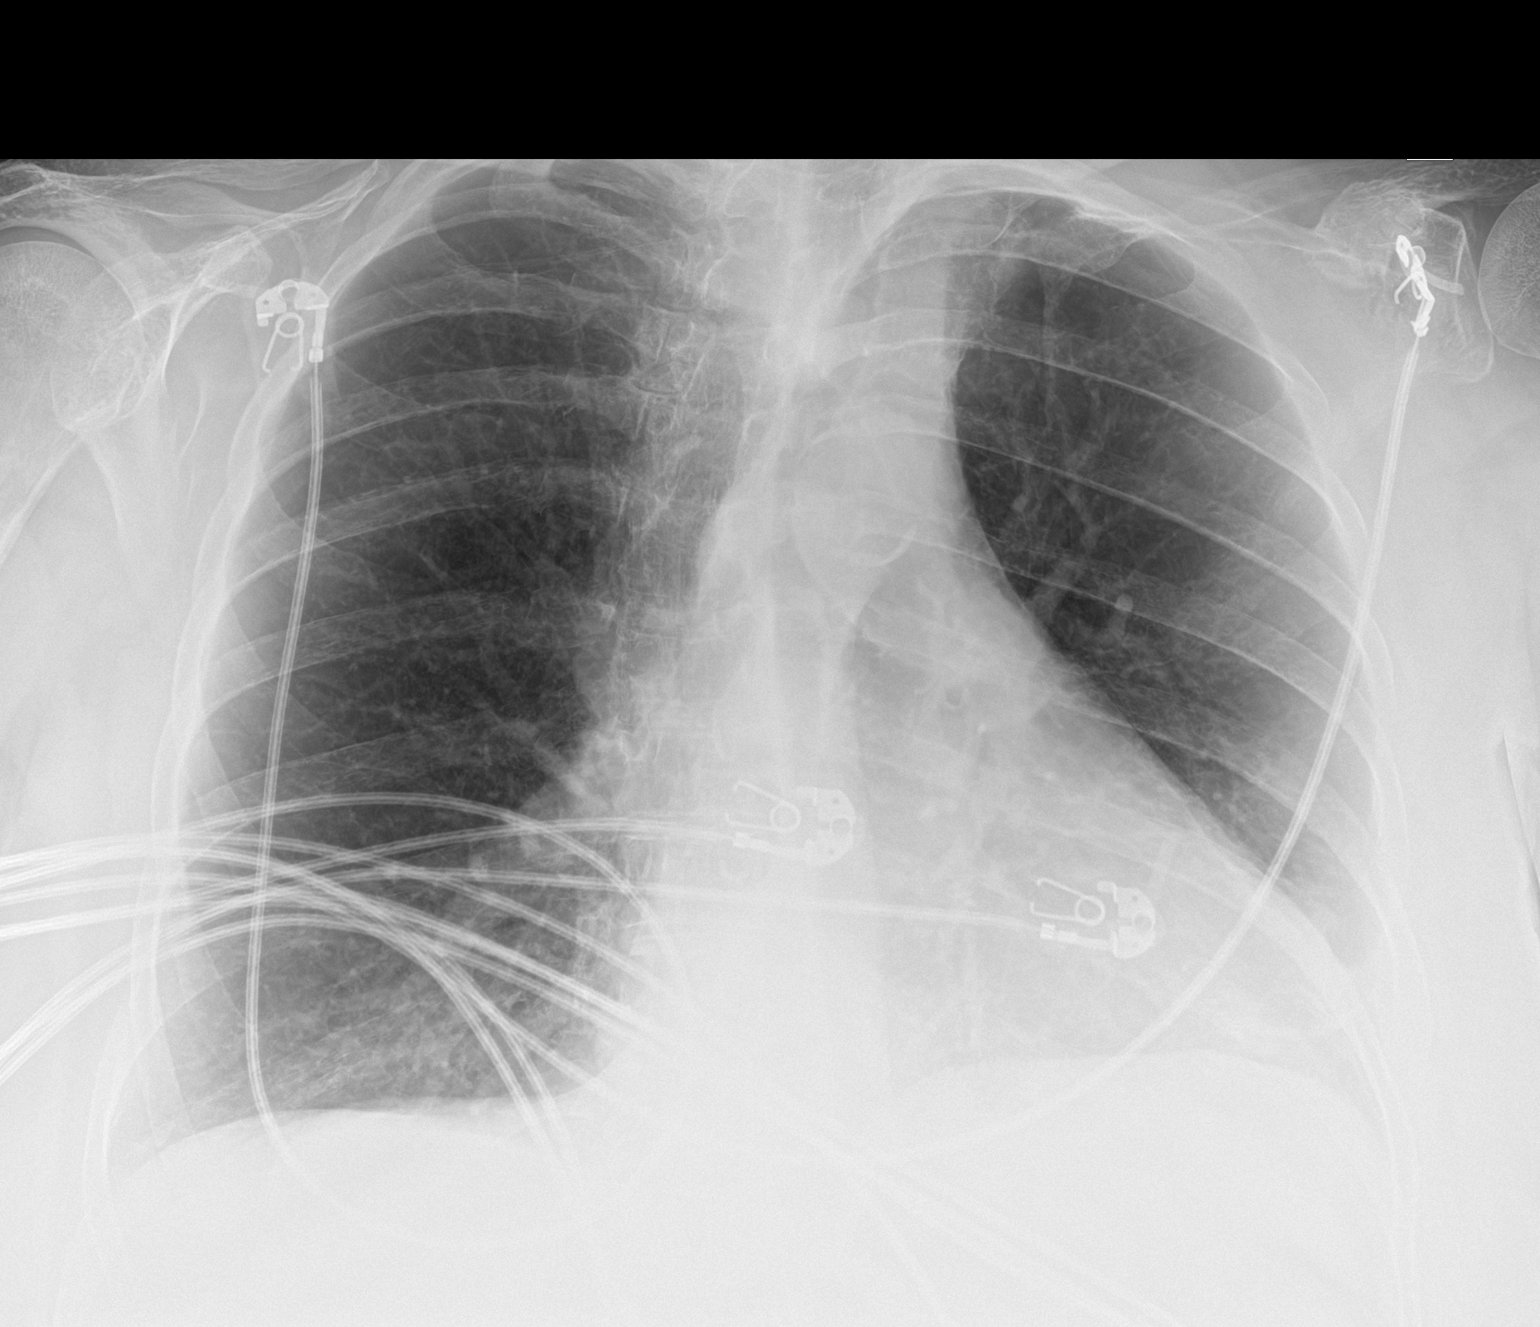

[1 of 1 positions shown; findings below may reference images not displayed]

FINDINGS: Heart size and mediastinal contours are stable. Lungs appear clear.
No pleural effusion or pneumothorax. Osseous structures about the
chest are unremarkable.
IMPRESSION: 1. No active disease. No evidence of pneumonia or pulmonary edema.
2. Multiple pulmonary nodules were identified on chest CT angiogram
of 04/10/2021. These small pulmonary nodules are below the
resolution of this chest x-ray. Please see follow-up recommendations
provided on the earlier chest CT angiogram report.

## 2023-03-31 ENCOUNTER — Other Ambulatory Visit: Payer: Self-pay | Admitting: Cardiology

## 2023-03-31 DIAGNOSIS — I480 Paroxysmal atrial fibrillation: Secondary | ICD-10-CM

## 2023-03-31 DIAGNOSIS — Z7901 Long term (current) use of anticoagulants: Secondary | ICD-10-CM

## 2023-03-31 NOTE — Telephone Encounter (Signed)
Prescription refill request for Eliquis received. Indication: Afib  Last office visit: 08/14/22 Odis Hollingshead)  Scr: 1.17 (08/01/22)  Age: 77 Weight: 85.7kg  Appropriate dose. Refill sent.

## 2023-04-08 ENCOUNTER — Other Ambulatory Visit: Payer: Self-pay | Admitting: Cardiology

## 2023-04-08 DIAGNOSIS — I5032 Chronic diastolic (congestive) heart failure: Secondary | ICD-10-CM

## 2023-04-30 ENCOUNTER — Other Ambulatory Visit: Payer: Self-pay | Admitting: Cardiology

## 2023-04-30 DIAGNOSIS — I480 Paroxysmal atrial fibrillation: Secondary | ICD-10-CM

## 2023-05-20 ENCOUNTER — Other Ambulatory Visit: Payer: Self-pay | Admitting: Cardiology

## 2023-05-20 DIAGNOSIS — I5032 Chronic diastolic (congestive) heart failure: Secondary | ICD-10-CM

## 2023-06-12 ENCOUNTER — Ambulatory Visit: Payer: Medicare Other | Attending: Cardiology | Admitting: Cardiology

## 2023-06-12 VITALS — BP 124/84 | HR 72 | Resp 16 | Ht 64.0 in | Wt 184.0 lb

## 2023-06-12 DIAGNOSIS — I6602 Occlusion and stenosis of left middle cerebral artery: Secondary | ICD-10-CM | POA: Diagnosis not present

## 2023-06-12 DIAGNOSIS — I5032 Chronic diastolic (congestive) heart failure: Secondary | ICD-10-CM | POA: Diagnosis not present

## 2023-06-12 DIAGNOSIS — Z7901 Long term (current) use of anticoagulants: Secondary | ICD-10-CM | POA: Diagnosis not present

## 2023-06-12 DIAGNOSIS — Z87891 Personal history of nicotine dependence: Secondary | ICD-10-CM

## 2023-06-12 DIAGNOSIS — I10 Essential (primary) hypertension: Secondary | ICD-10-CM

## 2023-06-12 DIAGNOSIS — I700 Atherosclerosis of aorta: Secondary | ICD-10-CM

## 2023-06-12 DIAGNOSIS — R911 Solitary pulmonary nodule: Secondary | ICD-10-CM

## 2023-06-12 DIAGNOSIS — I480 Paroxysmal atrial fibrillation: Secondary | ICD-10-CM

## 2023-06-12 DIAGNOSIS — I4819 Other persistent atrial fibrillation: Secondary | ICD-10-CM

## 2023-06-12 DIAGNOSIS — E782 Mixed hyperlipidemia: Secondary | ICD-10-CM

## 2023-06-12 NOTE — Progress Notes (Unsigned)
Cardiology Office Note:  .   Date:  06/13/2023  ID:  Michelle Dean, DOB October 25, 1945, MRN [id number] PCP:  De Blanch, MD  [facility] Providers Cardiologist:  Tessa Lerner, DO , [facility] (established care 02/27/2021) Electrophysiologist:  None  Click to update primary MD,subspecialty MD or APP then REFRESH:1}    Chief Complaint  Patient presents with   Follow-up    Persistent atrial fibrillation    History of Present Illness: .   Michelle Dean is a 78 y.o. Caucasian female whose past medical history and cardiovascular risk factors includes: Persistent atrial fibrillation, status post cardioversion, long-term oral anticoagulation/antiarrhythmic medications, chronic HFpEF, left MCA stroke, hyperlipidemia, COPD, pulmonary nodules, hypertension, aortic atherosclerosis, advanced age.  This patient is accompanied to the office by her  son . Michelle Dean provides verbal consent with regards to having him present during today's encounter.  Due to the degree of aphasia son provides majority of HPI.  Patient was initially evaluated in October 2022 when she presented hospital with acute confusion and expressive/receptive aphasia underwent TNK.  CT of the head which was negative for intracranial bleed.  CTA noted left M2/M3 occlusion and she underwent endovascular intervention. Postprocedure she was found to be in A-fib with RVR cardiology was consulted this hospitalization.  Patient has been cardioverted twice to maintain sinus rhythm.  She had was on amiodarone was discontinued secondary medication profile.  She is chose to continue with rate control strategy and anticoagulation for thromboembolic prophylaxis.  Since last office visit she is doing well from a A-fib standpoint.  She does not endorse any evidence of bleeding.  HFpEF: Requires episodes of HFpEF likely brought on by episodes of atrial fibrillation.  Up titration of GDMT has been difficult due to episodes of lightheaded,  dizziness, and medications being cost prohibitive.  Since last office visit she has not had any hospitalizations for congestive heart failure and is tolerating her medications well.  With the exception of feeling dizziness during midday.  When asked patient states that she takes majority of the medications in the morning.  At times she also hears a popping in the ear has not been evaluated by PCP or ENT for other noncardiac causes of her dizziness.  Review of Systems: .   Review of Systems  Cardiovascular:  Negative for chest pain, claudication, irregular heartbeat, leg swelling, near-syncope, orthopnea, palpitations, paroxysmal nocturnal dyspnea and syncope.  Respiratory:  Negative for shortness of breath.   Hematologic/Lymphatic: Negative for bleeding problem.  Neurological:  Positive for dizziness.       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 03/05/2021: 100J x 1 Converted to SR.   EKG: EKG Interpretation Date/Time:  Friday June 12 2023 14:29:10 EST Ventricular Rate:  71 PR Interval:  160 QRS Duration:  118 QT Interval:  418 QTC Calculation: 454 R Axis:   -13  Text Interpretation: Sinus rhythm with Premature atrial complexes Right bundle branch block When compared with ECG of 15-Jun-2021 19:59, No significant change since last tracing Confirmed by Tessa Lerner [phone]) on 06/12/2023 2:50:34 PM  Echocardiogram: 07/31/2022:  Normal LV systolic function with visual EF 60-65%. Left ventricle cavity is normal in size. Mild concentric hypertrophy of the left ventricle. Normal global wall motion. Doppler evidence of grade II diastolic dysfunction, elevated LAP. Calculated EF 67%.  Left atrial cavity is slightly dilated.  Structurally normal tricuspid valve.  Mild tricuspid regurgitation.  No evidence of pulmonary hypertension.  No prior available for comparison.   RADIOLOGY: CT PE protocol  04/10/2021 1. No pulmonary embolus or acute aortic syndrome. 2. Hiatal hernia. 3. Multiple pulmonary nodules. Most severe: 10 mm right lower lobe pulmonary nodule. Consider a non-contrast Chest CT at 3 months, aPET/CT, or tissue sampling. These guidelines do not apply to immunocompromised patients and patients with cancer. Follow up in patients with significant comorbidities as clinically warranted. For lung cancer screening, adhere to Lung-RADS guidelines. Reference: Radiology. 2017; 284(1):228-43. 4. Aortic Atherosclerosis (ICD10-I70.0) and Emphysema (ICD10-J43.9).  Risk Assessment/Calculations:   Click Here to Calculate/Change CHADS2VASc Score The patient's CHADS2-VASc score is 8, indicating a 10.8% annual risk of stroke.  Labs:       Latest Ref Rng & Units 06/12/2023    3:27 PM 08/01/2022   11:15 AM 06/24/2022   10:37 AM  CBC  Hemoglobin 11.1 - 15.9 g/dL 46.9  62.9  52.8   Hematocrit 34.0 - 46.6 % 42.9  37.8  39.0        Latest Ref Rng & Units 06/12/2023    3:27 PM 08/01/2022   11:15 AM 06/24/2022   10:37 AM  BMP  Glucose 70 - 99 mg/dL 91  93  93   BUN 8 - 27 mg/dL 18  19  13    Creatinine 0.57 - 1.00 mg/dL 4.13  2.44  0.10   BUN/Creat Ratio 12 - 28 15  16  13    Sodium 134 - 144 mmol/L 143  144  144   Potassium 3.5 - 5.2 mmol/L 4.9  4.4  4.2   Chloride 96 - 106 mmol/L 106  107  105   CO2 20 - 29 mmol/L 24  22  23    Calcium 8.7 - 10.3 mg/dL 9.0  9.3  9.5       Latest Ref Rng & Units 06/12/2023    3:27 PM 08/01/2022   11:15 AM 06/24/2022   10:37 AM  CMP  Glucose 70 - 99 mg/dL 91  93  93   BUN 8 - 27 mg/dL 18  19  13    Creatinine 0.57 - 1.00 mg/dL 2.72  5.36  6.44   Sodium 134 - 144 mmol/L 143  144  144   Potassium 3.5 - 5.2 mmol/L 4.9  4.4  4.2   Chloride 96 - 106 mmol/L 106  107  105   CO2 20 - 29 mmol/L 24  22  23    Calcium 8.7 - 10.3 mg/dL 9.0  9.3  9.5     Lab Results  Component Value Date   CHOL 131 02/26/2021   HDL 57 02/26/2021   LDLCALC 63 02/26/2021   TRIG  55 02/26/2021   CHOLHDL 2.3 02/26/2021   No results for input(s): "LIPOA" in the last 8760 hours. No components found for: "NTPROBNP" Recent Labs    08/01/22 1115  PROBNP 530   No results for input(s): "TSH" in the last 8760 hours.   Physical Exam:    Today's Vitals   06/12/23 1425  BP: 124/84  Pulse: 72  Resp: 16  SpO2: 94%  Weight: 184 lb (83.5 kg)  Height: 5\' 4"  (1.626 m)   Body mass index is 31.58 kg/m. Wt Readings from Last 3 Encounters:  06/12/23 184 lb (83.5 kg)  08/14/22 189 lb (85.7 kg)  07/24/22 188 lb (85.3 kg)    Physical Exam  Constitutional: No distress.  Appears older than stated age, hemodynamically stable.   Neck: No  JVD present.  Cardiovascular: Normal rate, regular rhythm, S1 normal, S2 normal, intact distal pulses and normal pulses. Exam reveals no gallop, no S3 and no S4.  No murmur heard. Pulmonary/Chest: Effort normal and breath sounds normal. No stridor. She has no wheezes. She has no rales.  Abdominal: Soft. Bowel sounds are normal. She exhibits no distension. There is no abdominal tenderness.  Musculoskeletal:        General: No edema.     Cervical back: Neck supple.  Neurological: She is alert and oriented to person, place, and time. She has intact cranial nerves (2-12).  Skin: Skin is warm and moist.     Impression & Recommendation(s):  Impression:   ICD-10-CM   1. Persistent atrial fibrillation (HCC)  I48.19 EKG 12-Lead    ECHOCARDIOGRAM COMPLETE    Basic metabolic panel    Hemoglobin and hematocrit, blood    Hemoglobin and hematocrit, blood    Basic metabolic panel    2. Long term (current) use of anticoagulants  Z79.01     3. Chronic heart failure with preserved ejection fraction (HFpEF) (HCC)  I50.32     4. 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 Mixed hyperlipidemia  E78.2     6. Benign hypertension  I10     7. Atherosclerosis of aorta (HCC)  I70.0     8. Former smoker  Z87.891     9. Pulmonary nodule   R91.1        Recommendation(s):  Persistent atrial fibrillation (HCC) Rate control: Cardizem Rhythm control: N/A. Thromboembolic prophylaxis: Eliquis History of cardioversion x 2 in October 2022 EKG today illustrates sinus rhythm Use to be on amiodarone in the past but discontinued due to medication profile and since then has maintained sinus rhythm  Long term (current) use of anticoagulants Indication: Persistent atrial fibrillation. Does not endorse evidence of bleeding. No recent falls. Risks, benefits, alternatives to anticoagulation re discussed with patient and son. Will check H&H and BMP  Chronic heart failure with preserved ejection fraction (HFpEF) (HCC) Stage C, NYHA class II Clinically euvolemic. In the past has had episodes of acute heart failure exacerbation due to A-fib episodes. Endorses feeling dizzy I suspect that this is secondary to taking all of her heart failure medications in the morning.  Medications that are dosed twice a day should be taken as prescribed but the ones that are dosed once a day take some in the morning and some in the evening this will help prevent episodes of dizziness and orthostasis.  Other causes of dizziness can be evaluated by PCP or neurology. Continue Jardiance 10 mg p.o. every morning. Continue losartan 50 mg p.o. every afternoon Continue magnesium oxide supplements daily. Continue Cardizem 120 mg p.o. every afternoon Echo will be ordered to evaluate for structural heart disease and left ventricular systolic function.  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 I have asked him to discuss this further with PCP and neurology. Reemphasized the importance of secondary prevention with focus on improving her modifiable cardiovascular risk factors such as glycemic control, lipid management, blood pressure control, weight loss.  Mixed hyperlipidemia Currently on  Lipitor 10 mg p.o. daily.   She denies myalgia or other side effects. No recent lipid profile available for review, have an upcoming appointment with PCP and will have it rechecked. Cardiology is following peripherally.  Benign hypertension Office blood pressures are well-controlled. Medications as discussed above  Pulmonary nodule Incidentally noted on a CT scan back in 2022.  Follows with PCP.  Orders Placed:  Orders Placed This Encounter  Procedures   Basic metabolic panel    Standing Status:   Future    Number of Occurrences:   1    Expected Date:   06/12/2023    Expiration Date:   06/11/2024   Hemoglobin and hematocrit, blood    Standing Status:   Future    Number of Occurrences:   1    Expected Date:   06/12/2023    Expiration Date:   06/11/2024   EKG 12-Lead   ECHOCARDIOGRAM COMPLETE    Standing Status:   Future    Expected Date:   06/19/2023    Expiration Date:   06/11/2024    Where should this test be performed:   [facility])    Does the patient weigh less than or greater than 250 lbs?:   Patient weighs less than 250 lbs    Perflutren DEFINITY (image enhancing agent) should be administered unless hypersensitivity or allergy exist:   Administer Perflutren    Reason for exam-Echo:   Other-Full Diagnosis List    Full ICD-10/Reason for Exam:   Paroxysmal A-fib (HCC) [409811]   Final Medication List:   No orders of the defined types were placed in this encounter.   There are no discontinued medications.   Current Outpatient Medications:    apixaban (ELIQUIS) 5 MG TABS tablet, TAKE ONE TABLET TWICE A DAY., Disp: 60 tablet, Rfl: 5   atorvastatin (LIPITOR) 10 MG tablet, Take 1 tablet (10 mg total) by mouth daily., Disp: 30 tablet, Rfl: 0   carboxymethylcellulose (REFRESH PLUS) 0.5 % SOLN, Place 1 drop into both eyes 4 (four) times daily as needed (dry eyes)., Disp: , Rfl:    cholecalciferol (VITAMIN D) 25 MCG (1000 UNIT) tablet, Take 2,000 Units by mouth  daily., Disp: , Rfl:    diltiazem (CARDIZEM CD) 120 MG 24 hr capsule, TAKE ONE CAPSULE BY MOUTH EVERY EVENING, Disp: 90 capsule, Rfl: 0   JARDIANCE 10 MG TABS tablet, TAKE ONE TABLET BY MOUTH BEFORE BREAKFAST DAILY, Disp: 90 tablet, Rfl: 0   losartan (COZAAR) 50 MG tablet, Take 50 mg by mouth daily., Disp: , Rfl:    magnesium oxide (MAG-OX) 400 MG tablet, Take 1 tablet (400 mg total) by mouth daily., Disp: 30 tablet, Rfl: 0   mometasone-formoterol (DULERA) 100-5 MCG/ACT AERO, Inhale 2 puffs into the lungs 2 (two) times daily., Disp: 13 g, Rfl: 0   Multiple Vitamins-Minerals (CERTAVITE/ANTIOXIDANTS) TABS, Take 1 tablet by mouth daily., Disp: 60 tablet, Rfl: 0   omeprazole (PRILOSEC) 20 MG capsule, Take 1 capsule (20 mg total) by mouth daily., Disp: 30 capsule, Rfl: 0   Psyllium (DAILY FIBER PO), Take 160 mg by mouth daily., Disp: , Rfl:    senna-docusate (SENOKOT-S) 8.6-50 MG tablet, Take 2 tablets by mouth 2 (two) times daily., Disp: , Rfl:    umeclidinium bromide (INCRUSE ELLIPTA) 62.5 MCG/ACT AEPB, Inhale 1 puff into the lungs daily., Disp: , Rfl:   Consent:   NA  Disposition:   12  months sooner if needed  Her questions and concerns were addressed to her satisfaction. She voices understanding of the recommendations provided during this encounter.    Signed, Tessa Lerner, DO, [facility]  [facility]  [address] [place], [place] [address] 06/13/2023 3:39 PM

## 2023-06-12 NOTE — Patient Instructions (Signed)
Medication Instructions:  Your physician recommends that you continue on your current medications as directed. Please refer to the Current Medication list given to you today.  CHANGE Losartan to be taken in the evening   *If you need a refill on your cardiac medications before your next appointment, please call your pharmacy*  Lab Work: None ordered today. If you have labs (blood work) drawn today and your tests are completely normal, you will receive your results only by: MyChart Message (if you have MyChart) OR A paper copy in the mail If you have any lab test that is abnormal or we need to change your treatment, we will call you to review the results.  Testing/Procedures: Your physician has requested that you have an echocardiogram prior to your 1 year follow-up with Dr. Odis Hollingshead. Echocardiography is a painless test that uses sound waves to create images of your heart. It provides your doctor with information about the size and shape of your heart and how well your heart's chambers and valves are working. This procedure takes approximately one hour. There are no restrictions for this procedure. Please do NOT wear cologne, perfume, aftershave, or lotions (deodorant is allowed). Please arrive 15 minutes prior to your appointment time.  Please note: We ask at that you not bring children with you during ultrasound (echo/ vascular) testing. Due to room size and safety concerns, children are not allowed in the ultrasound rooms during exams. Our front office staff cannot provide observation of children in our lobby area while testing is being conducted. An adult accompanying a patient to their appointment will only be allowed in the ultrasound room at the discretion of the ultrasound technician under special circumstances. We apologize for any inconvenience.   Follow-Up: At [facility], you and your health needs are our priority.  As part of our continuing mission to provide you with exceptional  heart care, we have created designated Provider Care Teams.  These Care Teams include your primary Cardiologist (physician) and Advanced Practice Providers (APPs -  Physician Assistants and Nurse Practitioners) who all work together to provide you with the care you need, when you need it.  We recommend signing up for the patient portal called "MyChart".  Sign up information is provided on this After Visit Summary.  MyChart is used to connect with patients for Virtual Visits (Telemedicine).  Patients are able to view lab/test results, encounter notes, upcoming appointments, etc.  Non-urgent messages can be sent to your provider as well.   To learn more about what you can do with MyChart, go to [email or web address].    Your next appointment:   1 year(s)  The format for your next appointment:   In Person  Provider:   Tessa Lerner, DO {

## 2023-06-13 ENCOUNTER — Encounter: Payer: Self-pay | Admitting: Cardiology

## 2023-06-13 LAB — BASIC METABOLIC PANEL
BUN/Creatinine Ratio: 15 (ref 12–28)
BUN: 18 mg/dL (ref 8–27)
CO2: 24 mmol/L (ref 20–29)
Calcium: 9 mg/dL (ref 8.7–10.3)
Chloride: 106 mmol/L (ref 96–106)
Creatinine, Ser: 1.18 mg/dL — ABNORMAL HIGH (ref 0.57–1.00)
Glucose: 91 mg/dL (ref 70–99)
Potassium: 4.9 mmol/L (ref 3.5–5.2)
Sodium: 143 mmol/L (ref 134–144)
eGFR: 47 mL/min/{1.73_m2} — ABNORMAL LOW (ref >59)

## 2023-06-13 LAB — HEMOGLOBIN AND HEMATOCRIT, BLOOD
Hematocrit: 42.9 % (ref 34.0–46.6)
Hemoglobin: 13.5 g/dL (ref 11.1–15.9)

## 2023-06-24 ENCOUNTER — Telehealth: Payer: Self-pay | Admitting: Cardiology

## 2023-06-24 NOTE — Telephone Encounter (Signed)
Pt son calling back for lab results

## 2023-06-24 NOTE — Telephone Encounter (Signed)
Spoke with son over the phone and went over BMP and H&H results for pt. Pt's son verbalized understanding and had no further questions at this time.

## 2023-07-09 ENCOUNTER — Telehealth: Payer: Self-pay | Admitting: Cardiology

## 2023-07-09 DIAGNOSIS — I480 Paroxysmal atrial fibrillation: Secondary | ICD-10-CM

## 2023-07-09 NOTE — Telephone Encounter (Signed)
 Spoke with pt's son and he stated that at the last appt we had split the Losartan and Cardizem to help with dizziness but even after the switch, she is still experiencing dizziness. Pt's son stated the pt's BP trends have been normal and her BP doesn't drop when she is dizzy. Explained to pt's son that I will forward this information to Dr. Odis Hollingshead for recommendations.

## 2023-07-09 NOTE — Telephone Encounter (Signed)
 Pt c/o medication issue:  1. Name of Medication: diltiazem (CARDIZEM CD) 120 MG 24 hr capsule    losartan (COZAAR) 50 MG tablet     2. How are you currently taking this medication (dosage and times per day)?   3. Are you having a reaction (difficulty breathing--STAT)? No  4. What is your medication issue? Pt's son called stating that above medications are causing pt to become dizzy. Please advise

## 2023-07-10 NOTE — Telephone Encounter (Signed)
 Spoke with pt's son and discussed Dr. Emelda Brothers recommendations with making an appt with PCP/ neuro, keeping meds the same, and ordering a 2 week Zio. Pt's son verbalized understanding and had no further questions at this time.

## 2023-07-10 NOTE — Telephone Encounter (Signed)
 Please have her follow-up with PCP and/or neurology as she is also having ear symptoms as per the last office note.  Since her blood pressures remained stable -I do not think changing medications will truly help her symptoms.  Please order a 2-week Zio patch to evaluate for conduction disease and A-fib burden.  Diagnosis for the Zio patch could be A-fib.  Nimsi Males Hubbard, DO, [facility]

## 2023-07-10 NOTE — Addendum Note (Signed)
 Addended by: Cherylann Banas on: 07/10/2023 06:25 PM   Modules accepted: Orders

## 2023-07-13 ENCOUNTER — Ambulatory Visit: Attending: Cardiology

## 2023-07-13 DIAGNOSIS — I480 Paroxysmal atrial fibrillation: Secondary | ICD-10-CM

## 2023-07-13 NOTE — Progress Notes (Unsigned)
 Enrolled for [employer] to mail a ZIO XT long term holter monitor to the patients address on file.

## 2023-08-07 ENCOUNTER — Other Ambulatory Visit: Payer: Self-pay | Admitting: Cardiology

## 2023-08-07 DIAGNOSIS — I480 Paroxysmal atrial fibrillation: Secondary | ICD-10-CM

## 2023-08-16 DIAGNOSIS — I480 Paroxysmal atrial fibrillation: Secondary | ICD-10-CM | POA: Diagnosis not present

## 2023-08-20 ENCOUNTER — Telehealth: Payer: Self-pay | Admitting: Cardiology

## 2023-08-20 NOTE — Telephone Encounter (Signed)
 Pt son returning call for monitor results

## 2023-08-20 NOTE — Telephone Encounter (Signed)
 Spoke with pt's son and went over heart monitor results for the pt. Explained that Dr. Odis Hollingshead would like to increased Cardizem once the pt's dizziness is resolved and asked pt's son to let our office know when that happens. Asked pt's son if the pt has seen her PCP about the continued dizziness and he stated that they have taken the pt to a Neurologist Trena Platt, MD at [place]) and they have ordered an MRI Head WO Contrast but they were waiting to schedule until they heard from our office to make sure the dizziness wasn't cardiac related. Explained to pt's son that I will forward these results to the pt's neurologist to see in order for their office to go ahead and move forward with the MRI Head WO Contrast. Pt's son agreed to this plan and verbalized understanding of information discussed over the phone, denied any further questions or concerns.

## 2023-08-27 ENCOUNTER — Other Ambulatory Visit: Payer: Self-pay | Admitting: Cardiology

## 2023-08-27 DIAGNOSIS — I5032 Chronic diastolic (congestive) heart failure: Secondary | ICD-10-CM

## 2023-09-27 ENCOUNTER — Other Ambulatory Visit: Payer: Self-pay | Admitting: Cardiology

## 2023-09-27 DIAGNOSIS — I480 Paroxysmal atrial fibrillation: Secondary | ICD-10-CM

## 2023-10-08 ENCOUNTER — Other Ambulatory Visit: Payer: Self-pay | Admitting: Cardiology

## 2023-10-08 DIAGNOSIS — I480 Paroxysmal atrial fibrillation: Secondary | ICD-10-CM

## 2023-10-08 NOTE — Telephone Encounter (Signed)
 Prescription refill request for Eliquis  received. Indication: PAF Last office visit: 06/12/23  Jesus Morones MD Scr: 1.18 on 06/12/23  Epic Age: 78 Weight: 83.5kg  Based on above findings Eliquis  5mg  twice daily is the appropriate dose.  Refill approved.

## 2023-10-20 NOTE — Progress Notes (Signed)
 HISTORY OF PRESENT ILLNESS:  The patient is an exceptionally pleasant 78 year old white female who comes in for a complete physical examination.  She has no acute complaints or problems at this time.  She does have a chronic history of recurrent mild vertigo and some aphasia status post stroke, but overall she has been doing very well.    REVIEW OF SYSTEMS:  Completely negative.   PAST MEDICAL HISTORY: 1.  Hypertension. 2.  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 3.  COPD. 4.  Hyperlipidemia. 5.  Gastroesophageal reflux disease. 6.  History of atrial fibrillation status post cardioversion in 2022 x2 and in sinus rhythm currently. 7.  Moderate obesity with a BMI of 33. 8.  History of ACE inhibitor-induced cough. 9.  Bilateral upper lobe lung nodules identified in 2024 (the patient refuses any further workup including biopsy or any other intervention). 10.  Hypovitaminosis D. 11.  Seasonal allergic rhinitis. 12.  Gravida 1, para 1. 13.  Status post bilateral cataract extraction with lens implants. 14.  Status post bladder suspension surgery. 15.  Status post tonsillectomy.   ALLERGIES: 1.  ASPIRIN  (excessive bleeding complications). 2.  PENICILLIN (urticarial rash).   MEDICATIONS: 1.  Eliquis  5 mg 1 p.o. b.i.d.  2.  Atorvastatin  10 mg 1 p.o. at bedtime. 3.  Dulera  inhaler 2 puffs every 12 hours. 4.  Jardiance  10 mg 1 p.o. every morning. 5.  Losartan  50 mg 1 p.o. every morning. 6.  Omeprazole  20 mg 1 p.o. daily. 7.  Vitamin D3 2000 international units p.o. daily. 8.  Diltiazem  CD 120 mg 1 p.o. daily. 9.  Multivitamin 1 p.o. daily.   FAMILY HISTORY:  Her father died at age 16.  She is not aware of the exact diagnosis.  Her mother died at age 75 secondary to complications of colon cancer.  She had one sister who died at age 89 of ovarian cancer.  She has one child, a son, who has a history of  a nonfunctioning kidney.  She is not aware of the exact diagnosis there.  Otherwise, he is doing well.  No other diseases run in the extended family that she is aware of.   SOCIAL HISTORY:  The patient lives alone.  She was widowed in 2020 after 40 years of marriage.  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  She quit smoking in 2011 after a 48-year history.  She denies any alcohol or illicit drug use and specifically denies ever using any needle drugs.  She has had no blood or blood product transfusions and uses no over-the-counter medicines routinely for anything.   PHYSICAL EXAMINATION: VITAL SIGNS:  Temperature 97.9, heart rate 72, blood pressure 122/76, height 5 feet 3 inches, weight 187. GENERAL:  She is a well-developed, well-nourished, well-hydrated white female, in no acute distress. HEENT:  Normocephalic, atraumatic.  Bulbar conjunctiva benign.  Funduscopic exam benign.  Tympanic membranes benign.  Nares and oropharynx benign.  NECK:  Supple.  No lymphadenopathy.  No thyromegaly. LUNGS:  Clear to auscultation bilaterally. HEART:  Regular with no murmurs, gallops, or rubs. ABDOMEN:  Obese, but soft, nontender, nondistended.  No organomegaly or masses palpable.  Normoactive bowel sounds. EXTREMITIES:  Show no clubbing, cyanosis, or edema.  SKIN:  Benign with no rashes or lesions. NEUROLOGIC:  Nonfocal with cranial nerves II-XII are grossly intact.  Strength 5/5 in  all extremities.  Deep tendon reflexes are 2+ at brachial, knee, and ankle.  No sensory deficits noted.   ASSESSMENT: 1.  Hypertension. 2.  Hyperlipidemia.  3.  Chronic obstructive pulmonary disease.  4.  CNS vascular disease status post ischemic stroke at the left middle cerebral artery. 5.  Gastroesophageal reflux disease. 6.  History of atrial fibrillation status post successful cardioversion. 7.  Moderate obesity with a BMI of 33. 8.  ACE inhibitor-induced cough. 9.   Bilateral upper lobe lung nodules. 10.  Hypovitaminosis D.  11.  Seasonal allergic rhinitis. 12.  Otherwise, well-adult female.   PLAN: 1.  Continue current medications. 2.  She is to have fasting lab work today including a fasting lipid profile, CBC, comprehensive metabolic panel, urinalysis, and a vitamin D level. 3.  The patient continues to refuse screening mammography. 4.  The patient will be called for an annual wellness visit exam that she will do telephonically with Harlene BIRCH. Shiela, RN. 5.  Return in four months for follow up, sooner if any problems arise.

## 2023-11-02 NOTE — Progress Notes (Signed)
  Telephonic Visit NON VIDEO VISIT   Patient has been advised as to the limitations and limited nature of physical exam due to nature of a telephone visit, the possibility of privacy risk in the use of a telephone visit, and that the healthcare provider may recommend visiting a healthcare clinic for in-person care and follow up.  This is a telephone visit. Examination conducted without the use of video cameras/computer monitors. Vital signs and other aspects of physical exam are limited due to the nature of this encounter.   I have spent the following amount of time in direct patient care for this encounter and in coordination of care: 11-20 minutes  Medicare AWV  Michelle Dean is a 78 y.o. female who presents for her subsequent annual wellness visit for Medicare.  Clinical documentation was reviewed and is accessible via encounter-level attachments.     Any physical exam components or additional concerns beyond the scope of the Annual Wellness Visit may be documented in a separate note within this encounter.  Medicare Required Components     Reviewed and updated this visit by provider: None       Medicare required assessment: Future risk of substance use disorder / overdose risk of 3% is typical (1.3-20%).     Patient Care Team: Lynwood Dallas Feeling, MD as PCP - General (Internal Medicine) Lynwood Dallas Feeling, MD (Internal Medicine) Glade Richer, RN  Vitals   There were no vitals filed for this visit.  Disposition   1. Medicare annual wellness visit, subsequent (Primary)   No follow-ups on file.   Health maintenance issues were discussed with the patient.  A written plan was provided to the patient in the form of patient instructions in the After Visit Summary document.    SDoH Screening Completed SDoH screening was reviewed as documented with appropriate diagnoses added based on screening.  Time spent performing SDoH screening, documenting as appropriate, and  entering intervention when necessary: less than 5 minutes

## 2023-11-06 ENCOUNTER — Other Ambulatory Visit: Payer: Self-pay | Admitting: Cardiology

## 2023-11-06 DIAGNOSIS — I480 Paroxysmal atrial fibrillation: Secondary | ICD-10-CM

## 2024-02-15 ENCOUNTER — Other Ambulatory Visit: Payer: Self-pay

## 2024-02-15 ENCOUNTER — Inpatient Hospital Stay (HOSPITAL_COMMUNITY)
Admission: EM | Admit: 2024-02-15 | Discharge: 2024-02-22 | DRG: 309 | Disposition: A | Discharge status: Home / Self Care | Attending: Family Medicine | Admitting: Family Medicine

## 2024-02-15 ENCOUNTER — Emergency Department (HOSPITAL_COMMUNITY)

## 2024-02-15 DIAGNOSIS — Z7901 Long term (current) use of anticoagulants: Secondary | ICD-10-CM

## 2024-02-15 DIAGNOSIS — R002 Palpitations: Secondary | ICD-10-CM | POA: Diagnosis present

## 2024-02-15 DIAGNOSIS — N1831 Chronic kidney disease, stage 3a: Secondary | ICD-10-CM | POA: Diagnosis present

## 2024-02-15 DIAGNOSIS — I5032 Chronic diastolic (congestive) heart failure: Secondary | ICD-10-CM | POA: Diagnosis present

## 2024-02-15 DIAGNOSIS — I480 Paroxysmal atrial fibrillation: Secondary | ICD-10-CM

## 2024-02-15 DIAGNOSIS — I4892 Unspecified atrial flutter: Secondary | ICD-10-CM | POA: Diagnosis present

## 2024-02-15 DIAGNOSIS — I130 Hypertensive heart and chronic kidney disease with heart failure and stage 1 through stage 4 chronic kidney disease, or unspecified chronic kidney disease: Secondary | ICD-10-CM | POA: Diagnosis present

## 2024-02-15 DIAGNOSIS — Z79899 Other long term (current) drug therapy: Secondary | ICD-10-CM

## 2024-02-15 DIAGNOSIS — Z8673 Personal history of transient ischemic attack (TIA), and cerebral infarction without residual deficits: Secondary | ICD-10-CM

## 2024-02-15 DIAGNOSIS — J449 Chronic obstructive pulmonary disease, unspecified: Secondary | ICD-10-CM | POA: Diagnosis present

## 2024-02-15 DIAGNOSIS — Z886 Allergy status to analgesic agent status: Secondary | ICD-10-CM

## 2024-02-15 DIAGNOSIS — D6869 Other thrombophilia: Secondary | ICD-10-CM | POA: Diagnosis present

## 2024-02-15 DIAGNOSIS — R000 Tachycardia, unspecified: Secondary | ICD-10-CM | POA: Diagnosis present

## 2024-02-15 DIAGNOSIS — Z7951 Long term (current) use of inhaled steroids: Secondary | ICD-10-CM

## 2024-02-15 DIAGNOSIS — Z8249 Family history of ischemic heart disease and other diseases of the circulatory system: Secondary | ICD-10-CM

## 2024-02-15 DIAGNOSIS — Z7984 Long term (current) use of oral hypoglycemic drugs: Secondary | ICD-10-CM

## 2024-02-15 DIAGNOSIS — I4819 Other persistent atrial fibrillation: Principal | ICD-10-CM | POA: Diagnosis present

## 2024-02-15 DIAGNOSIS — I483 Typical atrial flutter: Secondary | ICD-10-CM | POA: Diagnosis present

## 2024-02-15 DIAGNOSIS — E785 Hyperlipidemia, unspecified: Secondary | ICD-10-CM | POA: Diagnosis present

## 2024-02-15 DIAGNOSIS — Z880 Allergy status to penicillin: Secondary | ICD-10-CM

## 2024-02-15 DIAGNOSIS — Z87891 Personal history of nicotine dependence: Secondary | ICD-10-CM

## 2024-02-15 DIAGNOSIS — I4891 Unspecified atrial fibrillation: Principal | ICD-10-CM

## 2024-02-15 DIAGNOSIS — Z800 Family history of malignant neoplasm of digestive organs: Secondary | ICD-10-CM

## 2024-02-15 DIAGNOSIS — Z8419 Family history of other disorders of kidney and ureter: Secondary | ICD-10-CM

## 2024-02-15 MED ORDER — DILTIAZEM LOAD VIA INFUSION
10.0000 mg | Freq: Once | INTRAVENOUS | Status: AC
  Administered 2024-02-15: 10 mg via INTRAVENOUS
  Filled 2024-02-15: qty 10

## 2024-02-15 MED ORDER — DILTIAZEM HCL-DEXTROSE 125-5 MG/125ML-% IV SOLN (PREMIX)
5.0000 mg/h | INTRAVENOUS | Status: DC
  Administered 2024-02-15: 5 mg/h via INTRAVENOUS
  Filled 2024-02-15: qty 125

## 2024-02-15 NOTE — ED Notes (Signed)
 ED Provider Beam MD at bedside.

## 2024-02-15 NOTE — ED Triage Notes (Signed)
 Pt BIB EMS from home. EMS reports heart palpitations x 4days, takes meds for afib but does not know which one. Ems reports initially HR 150-160, after NS HR decreased to 110-130.   EMS VS 128/62, 98% RA

## 2024-02-15 NOTE — ED Provider Notes (Signed)
 Michelle Dean EMERGENCY DEPARTMENT AT [facility] Provider Note   CSN: [id number] Arrival date & time: 02/15/24  2315     Patient presents with: Palpitations   Michelle Dean is a 78 y.o. female.  {Add pertinent medical, surgical, social history, OB history to HPI:32947}  Palpitations  78 year old female with history of hypertension, paroxysmal A-fib on Eliquis , prior stroke, hyperlipidemia, congestive heart failure, presenting to the ED with palpitations.  EMS reported ongoing x 4 days, however patient reports she just noticed this yesterday.  She is not entirely sure when this began (ie whether she woke up with it or not).  States today she was checking her home pulse ox and noticed variable heart rates and became concerned.  She does report some SOB with exertion but denies chest pain.  No cough, fever, chills.  She has been compliant with all medication, denies any missed doses.  Did require cardioversion previously.  Prior to Admission medications   Medication Sig Start Date End Date Taking? Authorizing Provider  atorvastatin  (LIPITOR) 10 MG tablet Take 1 tablet (10 mg total) by mouth daily. 03/18/21   Angiulli, Toribio PARAS, PA-C  carboxymethylcellulose (REFRESH PLUS) 0.5 % SOLN Place 1 drop into both eyes 4 (four) times daily as needed (dry eyes).    [provider]  cholecalciferol (VITAMIN D) 25 MCG (1000 UNIT) tablet Take 2,000 Units by mouth daily.    [provider]  diltiazem  (CARDIZEM  CD) 120 MG 24 hr capsule TAKE ONE CAPSULE BY MOUTH EVERY EVENING 11/06/23   Tolia, Sunit, DO  ELIQUIS  5 MG TABS tablet TAKE ONE TABLET TWICE A DAY. 10/08/23   Tolia, Sunit, DO  empagliflozin  (JARDIANCE ) 10 MG TABS tablet Take 1 tablet (10 mg total) by mouth daily. 08/27/23   Tolia, Sunit, DO  losartan  (COZAAR ) 50 MG tablet Take 50 mg by mouth daily.    [provider]  magnesium  oxide (MAG-OX) 400 MG tablet Take 1 tablet (400 mg total) by mouth daily. 03/18/21    Angiulli, Toribio PARAS, PA-C  mometasone -formoterol  (DULERA ) 100-5 MCG/ACT AERO Inhale 2 puffs into the lungs 2 (two) times daily. 03/18/21   Angiulli, Toribio PARAS, PA-C  Multiple Vitamins-Minerals (CERTAVITE/ANTIOXIDANTS) TABS Take 1 tablet by mouth daily. 03/08/21   de Clint Kill, Cortney E, NP  omeprazole  (PRILOSEC) 20 MG capsule Take 1 capsule (20 mg total) by mouth daily. 03/18/21   Angiulli, Daniel J, PA-C  Psyllium (DAILY FIBER PO) Take 160 mg by mouth daily.    [provider]  senna-docusate (SENOKOT-S) 8.6-50 MG tablet Take 2 tablets by mouth 2 (two) times daily. 03/18/21   Angiulli, Toribio PARAS, PA-C  umeclidinium bromide  (INCRUSE ELLIPTA ) 62.5 MCG/ACT AEPB Inhale 1 puff into the lungs daily. 04/09/21   [provider]    Allergies: Aspirin , Penicillins, and Lisinopril    Review of Systems  Cardiovascular:  Positive for palpitations.  All other systems reviewed and are negative.   Updated Vital Signs BP (!) 146/114 (BP Location: Left Arm)   Pulse (!) 145   Temp 97.8 F (36.6 C) (Oral)   Resp 19   SpO2 100%   Physical Exam Vitals and nursing note reviewed.  Constitutional:      Appearance: She is well-developed.  HENT:     Head: Normocephalic and atraumatic.  Eyes:     Conjunctiva/sclera: Conjunctivae normal.     Pupils: Pupils are equal, round, and reactive to light.  Cardiovascular:     Rate and Rhythm: Tachycardia present. Rhythm irregularly  irregular.     Heart sounds: Normal heart sounds.     Comments: AFIB 130's- 140's during exam Pulmonary:     Effort: Pulmonary effort is normal. No respiratory distress.     Breath sounds: Normal breath sounds. No rhonchi.  Abdominal:     General: Bowel sounds are normal.     Palpations: Abdomen is soft.  Musculoskeletal:        General: Normal range of motion.     Cervical back: Normal range of motion.  Skin:    General: Skin is warm and dry.  Neurological:     Mental Status: She is alert and oriented to person,  place, and time.     (all labs ordered are listed, but only abnormal results are displayed) Labs Reviewed  CBC WITH DIFFERENTIAL/PLATELET  MAGNESIUM   COMPREHENSIVE METABOLIC PANEL WITH GFR  BRAIN NATRIURETIC PEPTIDE    EKG: None  Radiology: No results found.  {Document cardiac monitor, telemetry assessment procedure when appropriate:32947} Procedures   Medications Ordered in the ED  diltiazem  (CARDIZEM ) 1 mg/mL load via infusion 10 mg (has no administration in time range)    And  diltiazem  (CARDIZEM ) 125 mg in dextrose  5% 125 mL (1 mg/mL) infusion (has no administration in time range)      {Click here for ABCD2, HEART and other calculators REFRESH Note before signing:1}                              Medical Decision Making Amount and/or Complexity of Data Reviewed Labs: ordered. Radiology: ordered.  Risk Prescription drug management.   ***  {Document critical care time when appropriate  Document review of labs and clinical decision tools ie CHADS2VASC2, etc  Document your independent review of radiology images and any outside records  Document your discussion with family members, caretakers and with consultants  Document social determinants of health affecting pt's care  Document your decision making why or why not admission, treatments were needed:32947:::1}   Final diagnoses:  None    ED Discharge Orders     None

## 2024-02-15 NOTE — Progress Notes (Signed)
 Orthopedic Tech Progress Note Patient Details:  Michelle Dean 10/11/1945 [id number]  Patient ID: [name], female   DOB: 22-Jun-1945, 78 y.o.   MRN: [id number] I attended trauma page. Chandra Dorn PARAS 02/15/2024, 11:25 PM

## 2024-02-16 ENCOUNTER — Observation Stay (HOSPITAL_BASED_OUTPATIENT_CLINIC_OR_DEPARTMENT_OTHER)

## 2024-02-16 ENCOUNTER — Encounter (HOSPITAL_COMMUNITY): Payer: Self-pay | Admitting: Family Medicine

## 2024-02-16 DIAGNOSIS — D6869 Other thrombophilia: Secondary | ICD-10-CM | POA: Diagnosis present

## 2024-02-16 DIAGNOSIS — Z8419 Family history of other disorders of kidney and ureter: Secondary | ICD-10-CM | POA: Diagnosis not present

## 2024-02-16 DIAGNOSIS — Z7901 Long term (current) use of anticoagulants: Secondary | ICD-10-CM | POA: Diagnosis not present

## 2024-02-16 DIAGNOSIS — Z79899 Other long term (current) drug therapy: Secondary | ICD-10-CM | POA: Diagnosis not present

## 2024-02-16 DIAGNOSIS — Z8673 Personal history of transient ischemic attack (TIA), and cerebral infarction without residual deficits: Secondary | ICD-10-CM | POA: Diagnosis not present

## 2024-02-16 DIAGNOSIS — N1831 Chronic kidney disease, stage 3a: Secondary | ICD-10-CM | POA: Diagnosis present

## 2024-02-16 DIAGNOSIS — I480 Paroxysmal atrial fibrillation: Secondary | ICD-10-CM | POA: Diagnosis not present

## 2024-02-16 DIAGNOSIS — E785 Hyperlipidemia, unspecified: Secondary | ICD-10-CM | POA: Diagnosis present

## 2024-02-16 DIAGNOSIS — Z886 Allergy status to analgesic agent status: Secondary | ICD-10-CM | POA: Diagnosis not present

## 2024-02-16 DIAGNOSIS — Z880 Allergy status to penicillin: Secondary | ICD-10-CM | POA: Diagnosis not present

## 2024-02-16 DIAGNOSIS — I5032 Chronic diastolic (congestive) heart failure: Secondary | ICD-10-CM

## 2024-02-16 DIAGNOSIS — R000 Tachycardia, unspecified: Secondary | ICD-10-CM | POA: Diagnosis present

## 2024-02-16 DIAGNOSIS — I5030 Unspecified diastolic (congestive) heart failure: Secondary | ICD-10-CM | POA: Diagnosis not present

## 2024-02-16 DIAGNOSIS — J449 Chronic obstructive pulmonary disease, unspecified: Secondary | ICD-10-CM

## 2024-02-16 DIAGNOSIS — I4892 Unspecified atrial flutter: Secondary | ICD-10-CM

## 2024-02-16 DIAGNOSIS — Z7951 Long term (current) use of inhaled steroids: Secondary | ICD-10-CM | POA: Diagnosis not present

## 2024-02-16 DIAGNOSIS — Z87891 Personal history of nicotine dependence: Secondary | ICD-10-CM | POA: Diagnosis not present

## 2024-02-16 DIAGNOSIS — I130 Hypertensive heart and chronic kidney disease with heart failure and stage 1 through stage 4 chronic kidney disease, or unspecified chronic kidney disease: Secondary | ICD-10-CM | POA: Diagnosis present

## 2024-02-16 DIAGNOSIS — I4819 Other persistent atrial fibrillation: Secondary | ICD-10-CM | POA: Diagnosis present

## 2024-02-16 DIAGNOSIS — Z8249 Family history of ischemic heart disease and other diseases of the circulatory system: Secondary | ICD-10-CM | POA: Diagnosis not present

## 2024-02-16 DIAGNOSIS — Z7984 Long term (current) use of oral hypoglycemic drugs: Secondary | ICD-10-CM | POA: Diagnosis not present

## 2024-02-16 DIAGNOSIS — I10 Essential (primary) hypertension: Secondary | ICD-10-CM | POA: Diagnosis not present

## 2024-02-16 DIAGNOSIS — Z800 Family history of malignant neoplasm of digestive organs: Secondary | ICD-10-CM | POA: Diagnosis not present

## 2024-02-16 DIAGNOSIS — I4891 Unspecified atrial fibrillation: Secondary | ICD-10-CM | POA: Diagnosis not present

## 2024-02-16 DIAGNOSIS — R002 Palpitations: Secondary | ICD-10-CM | POA: Diagnosis present

## 2024-02-16 LAB — CBC WITH DIFFERENTIAL/PLATELET
Abs Immature Granulocytes: 0.04 K/uL (ref 0.00–0.07)
Basophils Absolute: 0.1 K/uL (ref 0.0–0.1)
Basophils Relative: 1 %
Eosinophils Absolute: 0.1 K/uL (ref 0.0–0.5)
Eosinophils Relative: 1 %
HCT: 42.1 % (ref 36.0–46.0)
Hemoglobin: 13 g/dL (ref 12.0–15.0)
Immature Granulocytes: 0 %
Lymphocytes Relative: 24 %
Lymphs Abs: 2.7 K/uL (ref 0.7–4.0)
MCH: 29.3 pg (ref 26.0–34.0)
MCHC: 30.9 g/dL (ref 30.0–36.0)
MCV: 94.8 fL (ref 80.0–100.0)
Monocytes Absolute: 1.4 K/uL — ABNORMAL HIGH (ref 0.1–1.0)
Monocytes Relative: 12 %
Neutro Abs: 6.9 K/uL (ref 1.7–7.7)
Neutrophils Relative %: 62 %
Platelets: 258 K/uL (ref 150–400)
RBC: 4.44 MIL/uL (ref 3.87–5.11)
RDW: 14.4 % (ref 11.5–15.5)
WBC: 11.2 K/uL — ABNORMAL HIGH (ref 4.0–10.5)
nRBC: 0 % (ref 0.0–0.2)

## 2024-02-16 LAB — COMPREHENSIVE METABOLIC PANEL WITH GFR
ALT: 11 U/L (ref 0–44)
AST: 20 U/L (ref 15–41)
Albumin: 3.2 g/dL — ABNORMAL LOW (ref 3.5–5.0)
Alkaline Phosphatase: 56 U/L (ref 38–126)
Anion gap: 11 (ref 5–15)
BUN: 17 mg/dL (ref 8–23)
CO2: 23 mmol/L (ref 22–32)
Calcium: 8 mg/dL — ABNORMAL LOW (ref 8.9–10.3)
Chloride: 109 mmol/L (ref 98–111)
Creatinine, Ser: 1.35 mg/dL — ABNORMAL HIGH (ref 0.44–1.00)
GFR, Estimated: 40 mL/min — ABNORMAL LOW (ref >60)
Glucose, Bld: 109 mg/dL — ABNORMAL HIGH (ref 70–99)
Potassium: 3.5 mmol/L (ref 3.5–5.1)
Sodium: 143 mmol/L (ref 135–145)
Total Bilirubin: 0.3 mg/dL (ref 0.0–1.2)
Total Protein: 5.7 g/dL — ABNORMAL LOW (ref 6.5–8.1)

## 2024-02-16 LAB — ECHOCARDIOGRAM COMPLETE
AR max vel: 2.99 cm2
AV Area VTI: 3.56 cm2
AV Area mean vel: 3 cm2
AV Mean grad: 3 mmHg
AV Peak grad: 6.1 mmHg
Ao pk vel: 1.23 m/s
Area-P 1/2: 3.93 cm2
Calc EF: 68.2 %
MV VTI: 3.28 cm2
S' Lateral: 2.8 cm
Single Plane A2C EF: 68.2 %
Single Plane A4C EF: 66.5 %

## 2024-02-16 LAB — BASIC METABOLIC PANEL WITH GFR
Anion gap: 7 (ref 5–15)
BUN: 13 mg/dL (ref 8–23)
CO2: 20 mmol/L — ABNORMAL LOW (ref 22–32)
Calcium: 8.2 mg/dL — ABNORMAL LOW (ref 8.9–10.3)
Chloride: 113 mmol/L — ABNORMAL HIGH (ref 98–111)
Creatinine, Ser: 1.19 mg/dL — ABNORMAL HIGH (ref 0.44–1.00)
GFR, Estimated: 47 mL/min — ABNORMAL LOW (ref >60)
Glucose, Bld: 100 mg/dL — ABNORMAL HIGH (ref 70–99)
Potassium: 4.9 mmol/L (ref 3.5–5.1)
Sodium: 140 mmol/L (ref 135–145)

## 2024-02-16 LAB — MAGNESIUM
Magnesium: 1.9 mg/dL (ref 1.7–2.4)
Magnesium: 2.2 mg/dL (ref 1.7–2.4)

## 2024-02-16 LAB — CBC
HCT: 39.5 % (ref 36.0–46.0)
Hemoglobin: 12.4 g/dL (ref 12.0–15.0)
MCH: 30 pg (ref 26.0–34.0)
MCHC: 31.4 g/dL (ref 30.0–36.0)
MCV: 95.4 fL (ref 80.0–100.0)
Platelets: 259 K/uL (ref 150–400)
RBC: 4.14 MIL/uL (ref 3.87–5.11)
RDW: 14.6 % (ref 11.5–15.5)
WBC: 9.9 K/uL (ref 4.0–10.5)
nRBC: 0 % (ref 0.0–0.2)

## 2024-02-16 LAB — BRAIN NATRIURETIC PEPTIDE: B Natriuretic Peptide: 253.9 pg/mL — ABNORMAL HIGH (ref 0.0–100.0)

## 2024-02-16 MED ORDER — UMECLIDINIUM BROMIDE 62.5 MCG/ACT IN AEPB
1.0000 | INHALATION_SPRAY | Freq: Every day | RESPIRATORY_TRACT | Status: DC
  Administered 2024-02-17 – 2024-02-22 (×6): 1 via RESPIRATORY_TRACT
  Filled 2024-02-16 (×2): qty 7

## 2024-02-16 MED ORDER — EMPAGLIFLOZIN 10 MG PO TABS
10.0000 mg | ORAL_TABLET | Freq: Every day | ORAL | Status: DC
  Administered 2024-02-16 – 2024-02-19 (×4): 10 mg via ORAL
  Filled 2024-02-16 (×4): qty 1

## 2024-02-16 MED ORDER — ATORVASTATIN CALCIUM 10 MG PO TABS
10.0000 mg | ORAL_TABLET | Freq: Every day | ORAL | Status: DC
  Administered 2024-02-16 – 2024-02-21 (×6): 10 mg via ORAL
  Filled 2024-02-16 (×7): qty 1

## 2024-02-16 MED ORDER — ACETAMINOPHEN 650 MG RE SUPP: 650.0000 mg | Freq: Four times a day (QID) | RECTAL | Status: DC | PRN

## 2024-02-16 MED ORDER — FLUTICASONE FUROATE-VILANTEROL 100-25 MCG/ACT IN AEPB
1.0000 | INHALATION_SPRAY | Freq: Every day | RESPIRATORY_TRACT | Status: DC
  Administered 2024-02-17: 1 via RESPIRATORY_TRACT
  Filled 2024-02-16: qty 28

## 2024-02-16 MED ORDER — POTASSIUM CHLORIDE CRYS ER 20 MEQ PO TBCR
40.0000 meq | EXTENDED_RELEASE_TABLET | Freq: Once | ORAL | Status: AC
  Administered 2024-02-16: 40 meq via ORAL
  Filled 2024-02-16: qty 2

## 2024-02-16 MED ORDER — PROCHLORPERAZINE EDISYLATE 10 MG/2ML IJ SOLN: 5.0000 mg | Freq: Four times a day (QID) | INTRAMUSCULAR | Status: DC | PRN

## 2024-02-16 MED ORDER — SENNOSIDES-DOCUSATE SODIUM 8.6-50 MG PO TABS: 1.0000 | ORAL_TABLET | Freq: Every evening | ORAL | Status: DC | PRN

## 2024-02-16 MED ORDER — DILTIAZEM HCL ER COATED BEADS 240 MG PO CP24
240.0000 mg | ORAL_CAPSULE | Freq: Every day | ORAL | Status: DC
  Administered 2024-02-16 – 2024-02-17 (×2): 240 mg via ORAL
  Filled 2024-02-16 (×2): qty 1

## 2024-02-16 MED ORDER — MAGNESIUM SULFATE IN D5W 1-5 GM/100ML-% IV SOLN
1.0000 g | Freq: Once | INTRAVENOUS | Status: AC
  Administered 2024-02-16: 1 g via INTRAVENOUS
  Filled 2024-02-16: qty 100

## 2024-02-16 MED ORDER — ACETAMINOPHEN 325 MG PO TABS
650.0000 mg | ORAL_TABLET | Freq: Four times a day (QID) | ORAL | Status: DC | PRN
  Administered 2024-02-18: 650 mg via ORAL
  Filled 2024-02-16: qty 2

## 2024-02-16 MED ORDER — SODIUM CHLORIDE 0.9% FLUSH
3.0000 mL | Freq: Two times a day (BID) | INTRAVENOUS | Status: DC
  Administered 2024-02-16 – 2024-02-21 (×12): 3 mL via INTRAVENOUS

## 2024-02-16 MED ORDER — APIXABAN 5 MG PO TABS
5.0000 mg | ORAL_TABLET | Freq: Two times a day (BID) | ORAL | Status: DC
  Administered 2024-02-16 – 2024-02-22 (×14): 5 mg via ORAL
  Filled 2024-02-16 (×14): qty 1

## 2024-02-16 MED ORDER — DILTIAZEM HCL-DEXTROSE 125-5 MG/125ML-% IV SOLN (PREMIX)
5.0000 mg/h | INTRAVENOUS | Status: AC
  Administered 2024-02-16: 7.5 mg/h via INTRAVENOUS
  Administered 2024-02-17: 5 mg/h via INTRAVENOUS
  Filled 2024-02-16: qty 125

## 2024-02-16 MED ORDER — PANTOPRAZOLE SODIUM 40 MG PO TBEC
40.0000 mg | DELAYED_RELEASE_TABLET | Freq: Every day | ORAL | Status: DC
  Administered 2024-02-16 – 2024-02-21 (×6): 40 mg via ORAL
  Filled 2024-02-16 (×7): qty 1

## 2024-02-16 NOTE — ED Notes (Signed)
 Notified pharmacy pt HR is less than 90 and Cardizem  infusion has been running at least 4 hrs, Spoke with Lynwood from Pharmacy who recommends continuing Cardizem  infusion until pt is reevaluated by day team.

## 2024-02-16 NOTE — H&P (Signed)
 History and Physical    Michelle Dean FMW:[id number] DOB: Oct 21, 1945 DOA: 02/15/2024  PCP: Bonney Lynwood BRAVO, MD   Patient coming from: Home   Chief Complaint: Palpitations   HPI: Michelle Dean is a 78 y.o. female with medical history significant for hypertension, hyperlipidemia, CKD 3A, history of CVA, PAF on Eliquis , and chronic HFpEF who presents with palpitations.  Patient reports that she was experiencing palpitations on 02/14/2024 but notes that it became more intense yesterday.  She denies lightheadedness or syncope, denies chest pain, but reports exertional dyspnea associated with this.  She has not noticed any recent leg swelling.  She reports strict adherence with her medications but she does not know the names of her medications.  ED Course: Upon arrival to the ED, patient is found to be afebrile and saturating well on room air with elevated heart rate and stable blood pressure.  Labs are most notable for creatinine 1.35, WBC 11,200, normal hemoglobin, and BNP 254.  EKG reveals atrial flutter with rate 139, RBBB, and LAFB.  She was given 10 mg IV diltiazem  and started on diltiazem  infusion in the ED.  Review of Systems:  All other systems reviewed and apart from HPI, are negative.  Past Medical History:  Diagnosis Date   (HFpEF) heart failure with preserved ejection fraction (HCC)    Hyperlipidemia    Hypertension    Paroxysmal atrial fibrillation (HCC)    RBBB    Stroke [facility])     Past Surgical History:  Procedure Laterality Date   BUBBLE STUDY  03/01/2021   Procedure: BUBBLE STUDY;  Surgeon: Michele Richardson, DO;  Location: [facility] ENDOSCOPY;  Service: Cardiovascular;;   CARDIOVERSION N/A 03/01/2021   Procedure: CARDIOVERSION;  Surgeon: Michele Richardson, DO;  Location: [facility] ENDOSCOPY;  Service: Cardiovascular;  Laterality: N/A;   CARDIOVERSION N/A 03/05/2021   Procedure: CARDIOVERSION;  Surgeon: Ladona Heinz, MD;  Location: [facility] ENDOSCOPY;  Service: Cardiovascular;  Laterality: N/A;    IR CT HEAD LTD  02/25/2021   IR PERCUTANEOUS ART THROMBECTOMY/INFUSION INTRACRANIAL INC DIAG ANGIO  02/25/2021   RADIOLOGY WITH ANESTHESIA N/A 02/25/2021   Procedure: IR WITH ANESTHESIA;  Surgeon: Radiologist, Medication, MD;  Location: [facility] OR;  Service: Radiology;  Laterality: N/A;   TEE WITHOUT CARDIOVERSION N/A 03/01/2021   Procedure: TRANSESOPHAGEAL ECHOCARDIOGRAM (TEE);  Surgeon: Michele Richardson, DO;  Location: [facility] ENDOSCOPY;  Service: Cardiovascular;  Laterality: N/A;    Social History:   reports that she quit smoking about 25 years ago. Her smoking use included cigarettes. She started smoking about 52 years ago. She has a 27 pack-year smoking history. She has never been exposed to tobacco smoke. She has never used smokeless tobacco. She reports that she does not drink alcohol and does not use drugs.  Allergies  Allergen Reactions   Aspirin      Other reaction(s): Bleeding   Penicillins Hives and Rash   Lisinopril Cough    Family History  Problem Relation Age of Onset   Heart attack Mother    Heart disease Mother    Cancer Mother 36   Kidney failure Father    Colon cancer Sister      Prior to Admission medications   Medication Sig Start Date End Date Taking? Authorizing Provider  atorvastatin  (LIPITOR) 10 MG tablet Take 1 tablet (10 mg total) by mouth daily. 03/18/21   Angiulli, Toribio PARAS, PA-C  carboxymethylcellulose (REFRESH PLUS) 0.5 % SOLN Place 1 drop into both eyes 4 (four) times daily as needed (dry eyes).  [provider]  cholecalciferol (VITAMIN D) 25 MCG (1000 UNIT) tablet Take 2,000 Units by mouth daily.    [provider]  diltiazem  (CARDIZEM  CD) 120 MG 24 hr capsule TAKE ONE CAPSULE BY MOUTH EVERY EVENING 11/06/23   Tolia, Sunit, DO  ELIQUIS  5 MG TABS tablet TAKE ONE TABLET TWICE A DAY. 10/08/23   Tolia, Sunit, DO  empagliflozin  (JARDIANCE ) 10 MG TABS tablet Take 1 tablet (10 mg total) by mouth daily. 08/27/23   Tolia, Sunit, DO  losartan  (COZAAR ) 50  MG tablet Take 50 mg by mouth daily.    [provider]  magnesium  oxide (MAG-OX) 400 MG tablet Take 1 tablet (400 mg total) by mouth daily. 03/18/21   Angiulli, Toribio PARAS, PA-C  mometasone -formoterol  (DULERA ) 100-5 MCG/ACT AERO Inhale 2 puffs into the lungs 2 (two) times daily. 03/18/21   Angiulli, Toribio PARAS, PA-C  Multiple Vitamins-Minerals (CERTAVITE/ANTIOXIDANTS) TABS Take 1 tablet by mouth daily. 03/08/21   de Clint Kill, Cortney E, NP  omeprazole  (PRILOSEC) 20 MG capsule Take 1 capsule (20 mg total) by mouth daily. 03/18/21   Angiulli, Daniel J, PA-C  Psyllium (DAILY FIBER PO) Take 160 mg by mouth daily.    [provider]  senna-docusate (SENOKOT-S) 8.6-50 MG tablet Take 2 tablets by mouth 2 (two) times daily. 03/18/21   Angiulli, Toribio PARAS, PA-C  umeclidinium bromide  (INCRUSE ELLIPTA ) 62.5 MCG/ACT AEPB Inhale 1 puff into the lungs daily. 04/09/21   [provider]    Physical Exam: Vitals:   02/16/24 0043 02/16/24 0120 02/16/24 0130 02/16/24 0153  BP: 116/77 134/73 128/68   Pulse: 72 73 79   Resp: 20 (!) 24 (!) 21   Temp: 97.8 F (36.6 C)   97.6 F (36.4 C)  TempSrc: Oral   Oral  SpO2: 98% 99% 98%     Constitutional: NAD, calm  Eyes: PERTLA, lids and conjunctivae normal ENMT: Mucous membranes are moist. Posterior pharynx clear of any exudate or lesions.   Neck: supple, no masses  Respiratory: no wheezing, no crackles. No accessory muscle use.  Cardiovascular: Rate ~120 and irregularly irregular. Trace lower extremity edema.   Abdomen: No tenderness, soft. Bowel sounds active.  Musculoskeletal: no clubbing / cyanosis. No joint deformity upper and lower extremities.   Skin: no significant rashes, lesions, ulcers. Warm, dry, well-perfused. Neurologic: CN 2-12 grossly intact. Moving all extremities. Alert and oriented.  Psychiatric: Pleasant. Cooperative.    Labs and Imaging on Admission: I have personally reviewed following labs and imaging  studies  CBC: Recent Labs  Lab 02/15/24 2330  WBC 11.2*  NEUTROABS 6.9  HGB 13.0  HCT 42.1  MCV 94.8  PLT 258   Basic Metabolic Panel: Recent Labs  Lab 02/15/24 2330  NA 143  K 3.5  CL 109  CO2 23  GLUCOSE 109*  BUN 17  CREATININE 1.35*  CALCIUM  8.0*  MG 1.9   GFR: CrCl cannot be calculated (Unknown ideal weight.). Liver Function Tests: Recent Labs  Lab 02/15/24 2330  AST 20  ALT 11  ALKPHOS 56  BILITOT 0.3  PROT 5.7*  ALBUMIN 3.2*   No results for input(s): LIPASE, AMYLASE in the last 168 hours. No results for input(s): AMMONIA in the last 168 hours. Coagulation Profile: No results for input(s): INR, PROTIME in the last 168 hours. Cardiac Enzymes: No results for input(s): CKTOTAL, CKMB, CKMBINDEX, TROPONINI in the last 168 hours. BNP (last 3 results) No results for input(s): PROBNP in the last 8760 hours. HbA1C: No  results for input(s): HGBA1C in the last 72 hours. CBG: No results for input(s): GLUCAP in the last 168 hours. Lipid Profile: No results for input(s): CHOL, HDL, LDLCALC, TRIG, CHOLHDL, LDLDIRECT in the last 72 hours. Thyroid Function Tests: No results for input(s): TSH, T4TOTAL, FREET4, T3FREE, THYROIDAB in the last 72 hours. Anemia Panel: No results for input(s): VITAMINB12, FOLATE, FERRITIN, TIBC, IRON, RETICCTPCT in the last 72 hours. Urine analysis:    Component Value Date/Time   COLORURINE YELLOW 06/15/2021 2006   APPEARANCEUR CLEAR 06/15/2021 2006   LABSPEC 1.015 06/15/2021 2006   PHURINE 6.0 06/15/2021 2006   GLUCOSEU NEGATIVE 06/15/2021 2006   HGBUR NEGATIVE 06/15/2021 2006   BILIRUBINUR NEGATIVE 06/15/2021 2006   KETONESUR NEGATIVE 06/15/2021 2006   PROTEINUR NEGATIVE 06/15/2021 2006   NITRITE NEGATIVE 06/15/2021 2006   LEUKOCYTESUR NEGATIVE 06/15/2021 2006   Sepsis Labs: @LABRCNTIP (procalcitonin:4,lacticidven:4) )No results found for this or any previous visit  (from the past 240 hours).   Radiological Exams on Admission: DG Chest Portable 1 View Result Date: 02/15/2024 CLINICAL DATA:  Atrial fibrillation.  Palpitations. EXAM: PORTABLE CHEST 1 VIEW COMPARISON:  06/15/2021 FINDINGS: Shallow inspiration. Linear atelectasis or scarring in the lung bases. Heart size and pulmonary vascularity are normal for technique. No airspace disease or consolidation in the lungs. No pleural effusion or pneumothorax. Calcification of the aorta. IMPRESSION: Shallow inspiration with scarring or atelectasis in the lung bases. Electronically Signed   By: Elsie Gravely M.D.   On: 02/15/2024 23:46    EKG: Independently reviewed. Atrial flutter, rate 139, RBBB, LAFB.   Assessment/Plan   1. Rapid atrial flutter and fibrillation  - Presents with palpitations found to be in rapid atrial flutter with rate 140, was started on IV diltiazem  in ED and now in atrial fibrillation with rate ~120   - Continue to titrate IV diltiazem , transition back to oral diltiazem  when parameters met, continue Eliquis  for stroke risk reduction    2. Chronic HFpEF  - Appears compensated    3. COPD  - Not in exacerbation  - Continue ICS-LABA and LAMA    4. Hx of stroke  - Continue Lipitor and Eliquis     5. CKD 3A  - SCr is 1.35 on admission; baseline may be closer to 1.1 or 1.2  - Renally-dose medications     DVT prophylaxis: Eliquis   Code Status: Full  Level of Care: Level of care: Progressive Family Communication: None present  Disposition Plan:  Patient is from: Home  Anticipated d/c is to: Home  Anticipated d/c date is: Possibly as early as 02/17/24  Patient currently: Pending rate-control, transition back to oral medications  Consults called: None  Admission status: Observation     Evalene GORMAN Sprinkles, MD [facility]  02/16/2024, 1:58 AM

## 2024-02-16 NOTE — Progress Notes (Signed)
 PROGRESS NOTE    Joory Gough  FMW:[id number] DOB: Feb 04, 1946 DOA: 02/15/2024 PCP: Bonney Lynwood BRAVO, MD   Brief Narrative:   Laquandra Carrillo is a 78 y.o. female with medical history significant for hypertension, hyperlipidemia, CKD 3A, history of CVA, PAF on Eliquis , and chronic HFpEF who presents with palpitations. Patient reports that she was experiencing palpitations on 02/14/2024 but notes that it became more intense on 02/15/2024 prompting her to come to the ER.  No other symptoms, patient reports compliance to medications.  She was found to be in rapid A-fib ordered in the ER.  BNP 254.  Vital signs stable. She was initiated on IV Cardizem  drip.  Assessment & Plan:   Principal Problem:   Atrial flutter with rapid ventricular response (HCC) Active Problems:   History of ischemic left MCA stroke   CKD stage 3a, GFR 45-59 ml/min (HCC)   Chronic diastolic CHF (congestive heart failure) (HCC)   COPD (chronic obstructive pulmonary disease) (HCC)   1. Rapid atrial flutter and fibrillation  - Presents with palpitations found to be in rapid atrial flutter with rate 140, rate has improved down to 80s-90s.  Will resume oral Cardizem  (increased to 240 daily from home dose of 120 daily) and wean off IV.  Continue Eliquis  for stroke risk reduction   -Obtain echocardiogram   2. Chronic HFpEF  - Appears compensated     3. COPD  - Not in exacerbation  - Continue ICS-LABA and LAMA     4. Hx of stroke  - Continue Lipitor and Eliquis      5. CKD 3A  - SCr is 1.35 on admission; baseline may be closer to 1.1 or 1.2  - Renally-dose medications       DVT prophylaxis: Eliquis   Code Status: Full  Level of Care: Level of care: Progressive Family Communication: Discussed with son over the phone per pt request Disposition Plan: home Admission status: Observation      Subjective:  Patient seen and examined at the bedside.  She is alert, oriented not in any acute distress.  Vital signs are  stable.  She is on IV Cardizem  drip at 10/h, heart rate in 80s to 90s A-fib.  Denies any abdominal pain or palpitations.  Objective: Vitals:   02/16/24 0400 02/16/24 0430 02/16/24 0530 02/16/24 0607  BP: 122/63 121/67 132/69   Pulse: 87 70 98   Resp: (!) 27 (!) 25 15   Temp:    97.8 F (36.6 C)  TempSrc:    Oral  SpO2: 97% 100% 100%    No intake or output data in the 24 hours ending 02/16/24 0850 There were no vitals filed for this visit.  Examination:  General exam: Appears calm and comfortable  Respiratory system: Bilateral decreased breath sounds at bases Cardiovascular system: S1 & S2 heard, Rate controlled Gastrointestinal system: Abdomen is nondistended, soft and nontender. Normal bowel sounds heard. Extremities: No cyanosis, clubbing, edema  Central nervous system: Alert and oriented. No focal neurological deficits. Moving extremities Skin: No rashes, lesions or ulcers Psychiatry: Judgement and insight appear normal. Mood & affect appropriate.     Data Reviewed: I have personally reviewed following labs and imaging studies  CBC: Recent Labs  Lab 02/15/24 2330 02/16/24 0500  WBC 11.2* 9.9  NEUTROABS 6.9  --   HGB 13.0 12.4  HCT 42.1 39.5  MCV 94.8 95.4  PLT 258 259   Basic Metabolic Panel: Recent Labs  Lab 02/15/24 2330 02/16/24 0500  NA 143 140  K 3.5 4.9  CL 109 113*  CO2 23 20*  GLUCOSE 109* 100*  BUN 17 13  CREATININE 1.35* 1.19*  CALCIUM  8.0* 8.2*  MG 1.9 2.2   GFR: CrCl cannot be calculated (Unknown ideal weight.). Liver Function Tests: Recent Labs  Lab 02/15/24 2330  AST 20  ALT 11  ALKPHOS 56  BILITOT 0.3  PROT 5.7*  ALBUMIN 3.2*   No results for input(s): LIPASE, AMYLASE in the last 168 hours. No results for input(s): AMMONIA in the last 168 hours. Coagulation Profile: No results for input(s): INR, PROTIME in the last 168 hours. Cardiac Enzymes: No results for input(s): CKTOTAL, CKMB, CKMBINDEX, TROPONINI in  the last 168 hours. BNP (last 3 results) No results for input(s): PROBNP in the last 8760 hours. HbA1C: No results for input(s): HGBA1C in the last 72 hours. CBG: No results for input(s): GLUCAP in the last 168 hours. Lipid Profile: No results for input(s): CHOL, HDL, LDLCALC, TRIG, CHOLHDL, LDLDIRECT in the last 72 hours. Thyroid Function Tests: No results for input(s): TSH, T4TOTAL, FREET4, T3FREE, THYROIDAB in the last 72 hours. Anemia Panel: No results for input(s): VITAMINB12, FOLATE, FERRITIN, TIBC, IRON, RETICCTPCT in the last 72 hours. Sepsis Labs: No results for input(s): PROCALCITON, LATICACIDVEN in the last 168 hours.  No results found for this or any previous visit (from the past 240 hours).       Radiology Studies: DG Chest Portable 1 View Result Date: 02/15/2024 CLINICAL DATA:  Atrial fibrillation.  Palpitations. EXAM: PORTABLE CHEST 1 VIEW COMPARISON:  06/15/2021 FINDINGS: Shallow inspiration. Linear atelectasis or scarring in the lung bases. Heart size and pulmonary vascularity are normal for technique. No airspace disease or consolidation in the lungs. No pleural effusion or pneumothorax. Calcification of the aorta. IMPRESSION: Shallow inspiration with scarring or atelectasis in the lung bases. Electronically Signed   By: Elsie Gravely M.D.   On: 02/15/2024 23:46        Scheduled Meds:  apixaban   5 mg Oral BID   atorvastatin   10 mg Oral Daily   fluticasone furoate-vilanterol  1 puff Inhalation Daily   pantoprazole   40 mg Oral Daily   sodium chloride  flush  3 mL Intravenous Q12H   umeclidinium bromide   1 puff Inhalation Daily   Continuous Infusions:  diltiazem  (CARDIZEM ) infusion 10 mg/hr (02/16/24 9790)          Shonte Beutler, MD [facility] 02/16/2024, 8:50 AM

## 2024-02-16 NOTE — ED Notes (Signed)
 CCMD called to place the patient on cardiac monitoring services.

## 2024-02-16 NOTE — ED Notes (Signed)
 Assuming pt care, pt bib ems

## 2024-02-16 NOTE — ED Notes (Signed)
 Assuming pt care, pt bib ems for palpitations 140-160 hr on arrival, pt reports med hx. Afib, takes blood thinner. Pt aaox4, mae, skin warm/dry. Pt denies pain/discomfort. Call bell within reach

## 2024-02-17 DIAGNOSIS — I480 Paroxysmal atrial fibrillation: Secondary | ICD-10-CM

## 2024-02-17 DIAGNOSIS — I4892 Unspecified atrial flutter: Secondary | ICD-10-CM | POA: Diagnosis not present

## 2024-02-17 DIAGNOSIS — I5030 Unspecified diastolic (congestive) heart failure: Secondary | ICD-10-CM | POA: Diagnosis not present

## 2024-02-17 DIAGNOSIS — I4891 Unspecified atrial fibrillation: Secondary | ICD-10-CM | POA: Diagnosis not present

## 2024-02-17 DIAGNOSIS — I10 Essential (primary) hypertension: Secondary | ICD-10-CM | POA: Diagnosis not present

## 2024-02-17 DIAGNOSIS — I5032 Chronic diastolic (congestive) heart failure: Secondary | ICD-10-CM | POA: Diagnosis not present

## 2024-02-17 DIAGNOSIS — N1831 Chronic kidney disease, stage 3a: Secondary | ICD-10-CM | POA: Diagnosis not present

## 2024-02-17 LAB — BASIC METABOLIC PANEL WITH GFR
Anion gap: 9 (ref 5–15)
BUN: 17 mg/dL (ref 8–23)
CO2: 24 mmol/L (ref 22–32)
Calcium: 8.7 mg/dL — ABNORMAL LOW (ref 8.9–10.3)
Chloride: 107 mmol/L (ref 98–111)
Creatinine, Ser: 1.3 mg/dL — ABNORMAL HIGH (ref 0.44–1.00)
GFR, Estimated: 42 mL/min — ABNORMAL LOW (ref >60)
Glucose, Bld: 90 mg/dL (ref 70–99)
Potassium: 4 mmol/L (ref 3.5–5.1)
Sodium: 140 mmol/L (ref 135–145)

## 2024-02-17 LAB — CBC
HCT: 39.7 % (ref 36.0–46.0)
Hemoglobin: 12.6 g/dL (ref 12.0–15.0)
MCH: 29.6 pg (ref 26.0–34.0)
MCHC: 31.7 g/dL (ref 30.0–36.0)
MCV: 93.2 fL (ref 80.0–100.0)
Platelets: 235 K/uL (ref 150–400)
RBC: 4.26 MIL/uL (ref 3.87–5.11)
RDW: 14.6 % (ref 11.5–15.5)
WBC: 9.1 K/uL (ref 4.0–10.5)
nRBC: 0 % (ref 0.0–0.2)

## 2024-02-17 MED ORDER — METOPROLOL TARTRATE 25 MG PO TABS
25.0000 mg | ORAL_TABLET | Freq: Two times a day (BID) | ORAL | Status: DC
  Administered 2024-02-17 – 2024-02-21 (×10): 25 mg via ORAL
  Filled 2024-02-17 (×11): qty 1

## 2024-02-17 MED ORDER — DILTIAZEM HCL ER COATED BEADS 180 MG PO CP24
180.0000 mg | ORAL_CAPSULE | Freq: Every day | ORAL | Status: DC
  Administered 2024-02-17 – 2024-02-18 (×2): 180 mg via ORAL
  Filled 2024-02-17 (×2): qty 1

## 2024-02-17 NOTE — ED Notes (Signed)
 Report given to [facility]

## 2024-02-17 NOTE — ED Notes (Addendum)
 Notified provider [place] MD of patient change in HR and issues with Cardizem  drip.   Provider note from [place], MD yesterday stated we would wean Cardizem  drip, but no nursing communication to wean, or order to stop the drip has been placed. I was planning to make an attempt to start the titration down as per his notes and order parameters however upon my 1000 med pass with the patient, the drip was found to be off.  Night shift had not documented any weaning or stopping of her drip, yet at 1000 when doing med pass, it was noted the drip was not running and her HR was becoming elevated to 100-110. The drip was reinitiated at 5mg .  Following this, At 1050 it was noted her HR had increased to 130-140, and the IV cardizem  was titrated up to 10mg .   Report given via telephone to [facility] RN on 3E at this time as well and patient transported upstairs at this time by float RN staff in ED on tele monitoring.

## 2024-02-17 NOTE — Consult Note (Signed)
 Cardiology Consultation   Patient ID: Michelle Dean MRN: [id number]; DOB: March 28, 1946  Admit date: 02/15/2024 Date of Consult: 02/17/2024  PCP:  Bonney Lynwood BRAVO, MD   [facility] Providers Cardiologist:  Madonna Large, DO        History of Present Illness:   [name] is a 78 year old female with hypertension, hyperlipidemia, PAF, h/o stroke, HFpEF, CKD stage IIIa, admitted with atrial flutter with RVR  So present at bedside. Patient presented with couple days of palpitations symptoms, denies any chest pain or shortness of breath.  Her home metoprolol  tartrate was resumed at 25 mg twice daily.  She was on diltiazem  CD1 20 milligram daily at home.  In the hospital, she was started on diltiazem  drip at 5 mics. She denies any fever, chills, cough symptoms. She is compliant with her medical therapy.   Past Medical History:  Diagnosis Date   (HFpEF) heart failure with preserved ejection fraction (HCC)    Hyperlipidemia    Hypertension    Paroxysmal atrial fibrillation (HCC)    RBBB    Stroke [facility])     Past Surgical History:  Procedure Laterality Date   BUBBLE STUDY  03/01/2021   Procedure: BUBBLE STUDY;  Surgeon: Large Madonna, DO;  Location: [facility] ENDOSCOPY;  Service: Cardiovascular;;   CARDIOVERSION N/A 03/01/2021   Procedure: CARDIOVERSION;  Surgeon: Large Madonna, DO;  Location: [facility] ENDOSCOPY;  Service: Cardiovascular;  Laterality: N/A;   CARDIOVERSION N/A 03/05/2021   Procedure: CARDIOVERSION;  Surgeon: Ladona Heinz, MD;  Location: [facility] ENDOSCOPY;  Service: Cardiovascular;  Laterality: N/A;   IR CT HEAD LTD  02/25/2021   IR PERCUTANEOUS ART THROMBECTOMY/INFUSION INTRACRANIAL INC DIAG ANGIO  02/25/2021   RADIOLOGY WITH ANESTHESIA N/A 02/25/2021   Procedure: IR WITH ANESTHESIA;  Surgeon: Radiologist, Medication, MD;  Location: [facility] OR;  Service: Radiology;  Laterality: N/A;   TEE WITHOUT CARDIOVERSION N/A 03/01/2021   Procedure: TRANSESOPHAGEAL ECHOCARDIOGRAM (TEE);  Surgeon:  Large Madonna, DO;  Location: [facility] ENDOSCOPY;  Service: Cardiovascular;  Laterality: N/A;     Home Medications:  Prior to Admission medications   Medication Sig Start Date End Date Taking? Authorizing Provider  acetaminophen  (TYLENOL ) 500 MG tablet Take 500 mg by mouth every 6 (six) hours as needed for moderate pain (pain score 4-6).   Yes [provider]  albuterol  (PROVENTIL ) (2.5 MG/3ML) 0.083% nebulizer solution Take 2.5 mg by nebulization every 4 (four) hours as needed for wheezing. 02/16/24  Yes [provider]  atorvastatin  (LIPITOR) 10 MG tablet Take 1 tablet (10 mg total) by mouth daily. 03/18/21  Yes Angiulli, Toribio PARAS, PA-C  carboxymethylcellulose (REFRESH PLUS) 0.5 % SOLN Place 1 drop into both eyes 4 (four) times daily as needed (dry eyes).   Yes [provider]  cholecalciferol (VITAMIN D) 25 MCG (1000 UNIT) tablet Take 2,000 Units by mouth daily.   Yes [provider]  diltiazem  (CARDIZEM  CD) 120 MG 24 hr capsule TAKE ONE CAPSULE BY MOUTH EVERY EVENING 11/06/23  Yes Tolia, Sunit, DO  ELIQUIS  5 MG TABS tablet TAKE ONE TABLET TWICE A DAY. 10/08/23  Yes Tolia, Sunit, DO  empagliflozin  (JARDIANCE ) 10 MG TABS tablet Take 1 tablet (10 mg total) by mouth daily. 08/27/23  Yes Tolia, Sunit, DO  losartan  (COZAAR ) 50 MG tablet Take 50 mg by mouth daily.   Yes [provider]  mometasone -formoterol  (DULERA ) 100-5 MCG/ACT AERO Inhale 2 puffs into the lungs 2 (two) times daily. 03/18/21  Yes Angiulli, Toribio PARAS, PA-C  Multiple Vitamins-Minerals (  CERTAVITE/ANTIOXIDANTS) TABS Take 1 tablet by mouth daily. 03/08/21  Yes de Clint Kill, Cortney E, NP  omeprazole  (PRILOSEC) 20 MG capsule Take 1 capsule (20 mg total) by mouth daily. 03/18/21  Yes Angiulli, Daniel J, PA-C  Psyllium (DAILY FIBER PO) Take 160 mg by mouth daily.   Yes [provider]  senna-docusate (SENOKOT-S) 8.6-50 MG tablet Take 2 tablets by mouth 2 (two) times daily. 03/18/21  Yes Angiulli,  Toribio PARAS, PA-C  umeclidinium bromide  (INCRUSE ELLIPTA ) 62.5 MCG/ACT AEPB Inhale 1 puff into the lungs daily. 04/09/21  Yes [provider]    Inpatient Medications: Scheduled Meds:  apixaban   5 mg Oral BID   atorvastatin   10 mg Oral Daily   empagliflozin   10 mg Oral Daily   metoprolol  tartrate  25 mg Oral BID   pantoprazole   40 mg Oral Daily   sodium chloride  flush  3 mL Intravenous Q12H   umeclidinium bromide   1 puff Inhalation Daily   Continuous Infusions:  diltiazem  (CARDIZEM ) infusion 5 mg/hr (02/17/24 1005)   PRN Meds: acetaminophen  **OR** acetaminophen , prochlorperazine, senna-docusate  Allergies:    Allergies  Allergen Reactions   Aspirin      Other reaction(s): Bleeding   Penicillins Hives and Rash    Social History:   Social History   Socioeconomic History   Marital status: Widowed    Spouse name: Not on file   Number of children: 1   Years of education: Not on file   Highest education level: Not on file  Occupational History   Not on file  Tobacco Use   Smoking status: Former    Current packs/day: 0.00    Average packs/day: 1 pack/day for 27.0 years (27.0 ttl pk-yrs)    Types: Cigarettes    Start date: 55    Quit date: 2000    Years since quitting: 25.7    Passive exposure: Never   Smokeless tobacco: Never  Vaping Use   Vaping status: Never Used  Substance and Sexual Activity   Alcohol use: Never   Drug use: Never   Sexual activity: Not Currently  Other Topics Concern   Not on file  Social History Narrative   Not on file   Social Drivers of Health   Financial Resource Strain: Low Risk  (11/02/2023)   Received from [facility]   Overall Financial Resource Strain (CARDIA)    Difficulty of Paying Living Expenses: Not hard at all  Food Insecurity: No Food Insecurity (02/16/2024)   Hunger Vital Sign    Worried About Running Out of Food in the Last Year: Never true    Ran Out of Food in the Last Year: Never true  Transportation  Needs: No Transportation Needs (02/16/2024)   PRAPARE - Administrator, Civil Service (Medical): No    Lack of Transportation (Non-Medical): No  Physical Activity: Unknown (11/02/2023)   Received from [facility]   Exercise Vital Sign    On average, how many days per week do you engage in moderate to strenuous exercise (like a brisk walk)?: 0 days    Minutes of Exercise per Session: Not on file  Stress: Stress Concern Present (11/02/2023)   Received from [facility] of Occupational Health - Occupational Stress Questionnaire    Feeling of Stress : Rather much  Social Connections: Socially Integrated (11/02/2023)   Received from [facility]   Social Network    How would you rate your social network (family, work, friends)?: Good  participation with social networks  Intimate Partner Violence: Not At Risk (02/16/2024)   Humiliation, Afraid, Rape, and Kick questionnaire    Fear of Current or Ex-Partner: No    Emotionally Abused: No    Physically Abused: No    Sexually Abused: No    Family History:    Family History  Problem Relation Age of Onset   Heart attack Mother    Heart disease Mother    Cancer Mother 30   Kidney failure Father    Colon cancer Sister      ROS:  Please see the history of present illness.  All other ROS reviewed and negative.     Physical Exam/Data:   Vitals:   02/17/24 1050 02/17/24 1107 02/17/24 1400 02/17/24 1408  BP:  (!) 143/88    Pulse: (!) 145 (!) 148 78 75  Resp: (!) 22 18 20 20   Temp:  97.8 F (36.6 C)    TempSrc:  Oral    SpO2: 94% 96%    Weight:  84.4 kg    Height:  5' 4 (1.626 m)     No intake or output data in the 24 hours ending 02/17/24 1433    02/17/2024   11:07 AM 02/16/2024   10:06 PM 06/12/2023    2:25 PM  Last 3 Weights  Weight (lbs) 186 lb 1.1 oz 184 lb 1.4 oz 184 lb  Weight (kg) 84.4 kg 83.5 kg 83.462 kg     Body mass index is 31.94 kg/m.  General:  Well nourished, well developed, in no  acute distress HEENT: normal Neck: no JVD Vascular: No carotid bruits; Distal pulses 2+ bilaterally Cardiac:  normal S1, S2; RRR; no murmur  Lungs:  clear to auscultation bilaterally, no wheezing, rhonchi or rales  Abd: soft, nontender, no hepatomegaly  Ext: no edema Musculoskeletal:  No deformities, BUE and BLE strength normal and equal Skin: warm and dry  Neuro:  CNs 2-12 intact, no focal abnormalities noted Psych:  Normal affect   EKG:  The EKG was personally reviewed and demonstrates:   EKG 02/17/2024: Afib w/RVR, ventricular rate 128 bpm Incomplete RBBB Nonspecific ST-T abnormality  Telemetry:  Telemetry was personally reviewed and demonstrates:   Afib w/RVR  Relevant CV Studies: Echocardiogram 02/16/2024:  1. Left ventricular ejection fraction, by estimation, is 60 to 65%. The  left ventricle has normal function. The left ventricle has no regional  wall motion abnormalities. Left ventricular diastolic function could not  be evaluated.   2. Right ventricular systolic function is normal. The right ventricular  size is normal. There is normal pulmonary artery systolic pressure.   3. Left atrial size was mildly dilated.   4. The mitral valve is normal in structure. Trivial mitral valve  regurgitation.   5. The aortic valve is tricuspid. Aortic valve regurgitation is not  visualized. No aortic stenosis is present.   6. The inferior vena cava is dilated in size with <50% respiratory  variability, suggesting right atrial pressure of 15 mmHg.   Cardiac monitor (Zio Patch): March 2025 Dominant rhythm sinus. Heart rate 42-197 bpm.  Avg HR 65 bpm. No atrial fibrillation detected during the monitoring period. No ventricular tachycardia, high grade AV block, pauses (3 seconds or longer).   Total supraventricular ectopic burden 3.8%. Asymptomatic episodes of PSVT (reported events 2633). Fastest episode: 07/28/2023 at 11:30 AM, 4 beats, 1.8 seconds, max HR 197 bpm, average HR 159  bpm.   Total ventricular ectopic burden <1%.   Patient triggered  events: 13.  Underlying rhythm sinus with baseline artifact and occasional supraventricular and ventricular ectopic beats.      Laboratory Data:  High Sensitivity Troponin:  No results for input(s): TROPONINIHS in the last 720 hours.   Chemistry Recent Labs  Lab 02/15/24 2330 02/16/24 0500 02/17/24 0401  NA 143 140 140  K 3.5 4.9 4.0  CL 109 113* 107  CO2 23 20* 24  GLUCOSE 109* 100* 90  BUN 17 13 17   CREATININE 1.35* 1.19* 1.30*  CALCIUM  8.0* 8.2* 8.7*  MG 1.9 2.2  --   GFRNONAA 40* 47* 42*  ANIONGAP 11 7 9     Recent Labs  Lab 02/15/24 2330  PROT 5.7*  ALBUMIN 3.2*  AST 20  ALT 11  ALKPHOS 56  BILITOT 0.3   Lipids No results for input(s): CHOL, TRIG, HDL, LABVLDL, LDLCALC, CHOLHDL in the last 168 hours.  Hematology Recent Labs  Lab 02/15/24 2330 02/16/24 0500 02/17/24 0401  WBC 11.2* 9.9 9.1  RBC 4.44 4.14 4.26  HGB 13.0 12.4 12.6  HCT 42.1 39.5 39.7  MCV 94.8 95.4 93.2  MCH 29.3 30.0 29.6  MCHC 30.9 31.4 31.7  RDW 14.4 14.6 14.6  PLT 258 259 235   Thyroid No results for input(s): TSH, FREET4 in the last 168 hours.  BNP Recent Labs  Lab 02/15/24 2330  BNP 253.9*    DDimer No results for input(s): DDIMER in the last 168 hours.   Radiology/Studies:  ECHOCARDIOGRAM COMPLETE Result Date: 02/16/2024    ECHOCARDIOGRAM REPORT   Patient Name:   AVIE CHECO Date of Exam: 02/16/2024 Medical Rec #:  [id number]     Height:       64.0 in Accession #:    [id number]    Weight:       184.0 lb Date of Birth:  1945/09/09    BSA:          1.888 m Patient Age:    1 years      BP:           122/71 mmHg Patient Gender: F             HR:           77 bpm. Exam Location:  Inpatient Procedure: 2D Echo, Color Doppler and Cardiac Doppler (Both Spectral and Color            Flow Doppler were utilized during procedure). Indications:     Atrial fluter I48.92  History:         Patient has prior  history of Echocardiogram examinations, most                  recent 02/26/2021. Stroke, Arrythmias:Atrial Flutter, Atrial                  Fibrillation and Tachycardia, Signs/Symptoms:Shortness of                  Breath; Risk Factors:Hypertension.  Sonographer:     BERNARDA ROCKS Referring Phys:  [id number] SANTOSH SIGDEL Diagnosing Phys: Joelle Cedars Tonleu IMPRESSIONS  1. Left ventricular ejection fraction, by estimation, is 60 to 65%. The left ventricle has normal function. The left ventricle has no regional wall motion abnormalities. Left ventricular diastolic function could not be evaluated.  2. Right ventricular systolic function is normal. The right ventricular size is normal. There is normal pulmonary artery systolic pressure.  3. Left atrial size was mildly dilated.  4. The mitral valve is normal  in structure. Trivial mitral valve regurgitation.  5. The aortic valve is tricuspid. Aortic valve regurgitation is not visualized. No aortic stenosis is present.  6. The inferior vena cava is dilated in size with <50% respiratory variability, suggesting right atrial pressure of 15 mmHg. FINDINGS  Left Ventricle: Left ventricular ejection fraction, by estimation, is 60 to 65%. The left ventricle has normal function. The left ventricle has no regional wall motion abnormalities. The left ventricular internal cavity size was normal in size. There is  no left ventricular hypertrophy. Left ventricular diastolic function could not be evaluated due to atrial fibrillation. Left ventricular diastolic function could not be evaluated. Right Ventricle: The right ventricular size is normal. Right vetricular wall thickness was not well visualized. Right ventricular systolic function is normal. There is normal pulmonary artery systolic pressure. The tricuspid regurgitant velocity is 2.32 m/s, and with an assumed right atrial pressure of 3 mmHg, the estimated right ventricular systolic pressure is 24.5 mmHg. Left Atrium: Left atrial  size was mildly dilated. Right Atrium: Right atrial size was normal in size. Pericardium: There is no evidence of pericardial effusion. Mitral Valve: The mitral valve is normal in structure. Trivial mitral valve regurgitation. MV peak gradient, 4.8 mmHg. The mean mitral valve gradient is 2.0 mmHg. Tricuspid Valve: The tricuspid valve is normal in structure. Tricuspid valve regurgitation is trivial. Aortic Valve: The aortic valve is tricuspid. Aortic valve regurgitation is not visualized. No aortic stenosis is present. Aortic valve mean gradient measures 3.0 mmHg. Aortic valve peak gradient measures 6.1 mmHg. Aortic valve area, by VTI measures 3.56 cm. Pulmonic Valve: The pulmonic valve was normal in structure. Pulmonic valve regurgitation is not visualized. Aorta: The aortic root and ascending aorta are structurally normal, with no evidence of dilitation. Venous: The inferior vena cava is dilated in size with less than 50% respiratory variability, suggesting right atrial pressure of 15 mmHg. IAS/Shunts: No atrial level shunt detected by color flow Doppler.  LEFT VENTRICLE PLAX 2D LVIDd:         4.30 cm LVIDs:         2.80 cm LV PW:         1.00 cm LV IVS:        1.00 cm LVOT diam:     2.20 cm LV SV:         72 LV SV Index:   38 LVOT Area:     3.80 cm  LV Volumes (MOD) LV vol d, MOD A2C: 129.0 ml LV vol d, MOD A4C: 86.6 ml LV vol s, MOD A2C: 41.0 ml LV vol s, MOD A4C: 29.0 ml LV SV MOD A2C:     88.0 ml LV SV MOD A4C:     86.6 ml LV SV MOD BP:      73.6 ml RIGHT VENTRICLE             IVC RV Basal diam:  3.80 cm     IVC diam: 2.40 cm RV S prime:     10.00 cm/s TAPSE (M-mode): 2.1 cm RVSP:           24.5 mmHg LEFT ATRIUM             Index        RIGHT ATRIUM           Index LA diam:        4.70 cm 2.49 cm/m   RA Pressure: 3.00 mmHg LA Vol (A2C):   85.5 ml 45.28 ml/m  RA  Area:     13.10 cm LA Vol (A4C):   50.2 ml 26.59 ml/m  RA Volume:   30.00 ml  15.89 ml/m LA Biplane Vol: 65.3 ml 34.58 ml/m  AORTIC VALVE                     PULMONIC VALVE AV Area (Vmax):    2.99 cm     PV Vmax:          1.08 m/s AV Area (Vmean):   3.00 cm     PV Peak grad:     4.7 mmHg AV Area (VTI):     3.56 cm     PR End Diast Vel: 4.24 msec AV Vmax:           123.00 cm/s AV Vmean:          77.700 cm/s AV VTI:            0.202 m AV Peak Grad:      6.1 mmHg AV Mean Grad:      3.0 mmHg LVOT Vmax:         96.90 cm/s LVOT Vmean:        61.300 cm/s LVOT VTI:          0.189 m LVOT/AV VTI ratio: 0.94  AORTA Ao Root diam: 3.30 cm Ao Asc diam:  3.50 cm MITRAL VALVE               TRICUSPID VALVE MV Area (PHT): 3.93 cm    TR Peak grad:   21.5 mmHg MV Area VTI:   3.28 cm    TR Vmax:        232.00 cm/s MV Peak grad:  4.8 mmHg    Estimated RAP:  3.00 mmHg MV Mean grad:  2.0 mmHg    RVSP:           24.5 mmHg MV Vmax:       1.09 m/s MV Vmean:      65.7 cm/s   SHUNTS MV Decel Time: 193 msec    Systemic VTI:  0.19 m MV E velocity: 99.40 cm/s  Systemic Diam: 2.20 cm MV A velocity: 50.10 cm/s MV E/A ratio:  1.98 Franck Azobou Tonleu Electronically signed by Joelle Cedars Tonleu Signature Date/Time: 02/16/2024/11:13:26 AM    Final (Updated)    DG Chest Portable 1 View Result Date: 02/15/2024 CLINICAL DATA:  Atrial fibrillation.  Palpitations. EXAM: PORTABLE CHEST 1 VIEW COMPARISON:  06/15/2021 FINDINGS: Shallow inspiration. Linear atelectasis or scarring in the lung bases. Heart size and pulmonary vascularity are normal for technique. No airspace disease or consolidation in the lungs. No pleural effusion or pneumothorax. Calcification of the aorta. IMPRESSION: Shallow inspiration with scarring or atelectasis in the lung bases. Electronically Signed   By: Elsie Gravely M.D.   On: 02/15/2024 23:46     Assessment and Plan:   78 year old female with hypertension, hyperlipidemia, PAF, h/o stroke, HFpEF, CKD stage IIIa, admitted with atrial flutter with RVR  Paroxysmal A-fib: Presentation with A-fib with RVR. Currently, she is irregular rhythm in 70s.  Difficult  to appreciate P waves on telemetry, but will obtain EKG to confirm if she is back in sinus rhythm. She was on diltiazem  CD 120 mg daily at home, currently on diltiazem  5 mics an hour. I recommend increasing diltiazem  CD dose to 180 mg daily, and turn off the drip 2 hours later. Continue metoprolol  tartrate 25 mg twice daily. Continue Eliquis  5 mg twice daily.  Watch overnight to see if she is maintaining sinus rhythm/or controlled ventricular rate. I anticipate she will be able to be discharged home tomorrow with outpatient follow-up, possibly with A-fib clinic. In future, if symptomatic A-fib becomes frequent, could consider rhythm control therapy.  Hypertension: Controlled.  Continue losartan  50 mg daily.  HFpEF: Prior history, currently not in acute heart failure. Continue Jardiance  10 mg daily.  Will reassess rate/rhythm tomorrow morning.   Risk Assessment/Risk Scores:    CHA2DS2-VASc Score = 8   This indicates a 10.8% annual risk of stroke. The patient's score is based upon: CHF History: 1 HTN History: 1 Diabetes History: 0 Stroke History: 2 Vascular Disease History: 1 Age Score: 2 Gender Score: 1   For questions or updates, please contact [facility] Please consult [email or web address] for contact info under    Signed, Newman JINNY Lawrence, MD  02/17/2024 2:33 PM

## 2024-02-17 NOTE — Progress Notes (Signed)
 [facility]  PROGRESS NOTE  Michelle Dean FMW:[id number] DOB: March 13, 1946 DOA: 02/15/2024 PCP: Bonney Lynwood BRAVO, MD   Brief HPI:    78 y.o. female with medical history significant for hypertension, hyperlipidemia, CKD 3A, history of CVA, PAF on Eliquis , and chronic HFpEF who presents with palpitations. Patient reports that she was experiencing palpitations on 02/14/2024 but notes that it became more intense on 02/15/2024 prompting her to come to the ER.  No other symptoms, patient reports compliance to medications.  She was found to be in rapid A-fib ordered in the ER.  BNP 254.  Vital signs stable. She was initiated on IV Cardizem  drip.    Assessment/Plan:   1.  Atrial flutter with RVR - Presents with palpitations found to be in rapid atrial flutter with rate 140, show started on oral Cardizem  yesterday with improvement in heart rate.  This morning she went again in atrial flutter with RVR.  Started back on IV Cardizem  10 mg/h.  Will initiate metoprolol  25 mg p.o. twice daily.  Will hold oral Cardizem  at this time.  Continue Eliquis  for stroke risk reduction   -Obtain echocardiogram   2. Chronic HFpEF  - Appears compensated      3. COPD  - Not in exacerbation  - Continue ICS-LABA and Cate Oravec     4. Hx of stroke  - Continue Lipitor and Eliquis      5. CKD 3A  - SCr is 1.35 on admission; baseline may be closer to 1.1 or 1.2  - Renally-dose medications        Medications     apixaban   5 mg Oral BID   atorvastatin   10 mg Oral Daily   diltiazem   240 mg Oral Daily   empagliflozin   10 mg Oral Daily   fluticasone furoate-vilanterol  1 puff Inhalation Daily   pantoprazole   40 mg Oral Daily   sodium chloride  flush  3 mL Intravenous Q12H   umeclidinium bromide   1 puff Inhalation Daily     Data Reviewed:   CBG:  No results for input(s): GLUCAP in the last 168 hours.  SpO2: 100 %    Vitals:   02/16/24 2211 02/17/24 0130 02/17/24 0330 02/17/24 0714  BP: 117/68 120/67  116/71 115/72  Pulse: 77 74 74 74  Resp: (!) 22 (!) 21 19 18   Temp: 97.8 F (36.6 C)  97.9 F (36.6 C) 97.9 F (36.6 C)  TempSrc: Oral   Oral  SpO2: 96% 98% 96% 100%  Weight:      Height:          Data Reviewed:  Basic Metabolic Panel: Recent Labs  Lab 02/15/24 2330 02/16/24 0500 02/17/24 0401  NA 143 140 140  K 3.5 4.9 4.0  CL 109 113* 107  CO2 23 20* 24  GLUCOSE 109* 100* 90  BUN 17 13 17   CREATININE 1.35* 1.19* 1.30*  CALCIUM  8.0* 8.2* 8.7*  MG 1.9 2.2  --     CBC: Recent Labs  Lab 02/15/24 2330 02/16/24 0500 02/17/24 0401  WBC 11.2* 9.9 9.1  NEUTROABS 6.9  --   --   HGB 13.0 12.4 12.6  HCT 42.1 39.5 39.7  MCV 94.8 95.4 93.2  PLT 258 259 235    LFT Recent Labs  Lab 02/15/24 2330  AST 20  ALT 11  ALKPHOS 56  BILITOT 0.3  PROT 5.7*  ALBUMIN 3.2*     Antibiotics: Anti-infectives (From admission, onward)    None  DVT prophylaxis: Apixaban   Code Status: Full code  Family Communication:    CONSULTS    Subjective   Denies chest pain or shortness of breath.   Objective    Physical Examination:   General-appears in no acute distress Heart-irregularly irregular Lungs-clear to auscultation bilaterally, no wheezing or crackles auscultated Abdomen-soft, nontender, no organomegaly Extremities-no edema in the lower extremities Neuro-alert, oriented x3, no focal deficit noted   Status is: Inpatient:             [name]   [facility] If 7PM-7AM, please contact night-coverage at [email or web address], Office  [phone]   02/17/2024, 8:06 AM  LOS: 1 day

## 2024-02-17 NOTE — Plan of Care (Signed)

## 2024-02-17 NOTE — ED Notes (Signed)
 Introduced self to patient at this time. Patient is resting in bed with visible chest rise and fall. The call light is in reach. There are no further requests at this time.

## 2024-02-18 DIAGNOSIS — I4892 Unspecified atrial flutter: Secondary | ICD-10-CM | POA: Diagnosis not present

## 2024-02-18 DIAGNOSIS — I5032 Chronic diastolic (congestive) heart failure: Secondary | ICD-10-CM | POA: Diagnosis not present

## 2024-02-18 DIAGNOSIS — I10 Essential (primary) hypertension: Secondary | ICD-10-CM | POA: Diagnosis not present

## 2024-02-18 DIAGNOSIS — J449 Chronic obstructive pulmonary disease, unspecified: Secondary | ICD-10-CM | POA: Diagnosis not present

## 2024-02-18 LAB — BASIC METABOLIC PANEL WITH GFR
Anion gap: 11 (ref 5–15)
BUN: 21 mg/dL (ref 8–23)
CO2: 20 mmol/L — ABNORMAL LOW (ref 22–32)
Calcium: 8.6 mg/dL — ABNORMAL LOW (ref 8.9–10.3)
Chloride: 108 mmol/L (ref 98–111)
Creatinine, Ser: 1.29 mg/dL — ABNORMAL HIGH (ref 0.44–1.00)
GFR, Estimated: 42 mL/min — ABNORMAL LOW (ref >60)
Glucose, Bld: 90 mg/dL (ref 70–99)
Potassium: 4.5 mmol/L (ref 3.5–5.1)
Sodium: 139 mmol/L (ref 135–145)

## 2024-02-18 LAB — CBC
HCT: 40.2 % (ref 36.0–46.0)
Hemoglobin: 12.7 g/dL (ref 12.0–15.0)
MCH: 29.3 pg (ref 26.0–34.0)
MCHC: 31.6 g/dL (ref 30.0–36.0)
MCV: 92.6 fL (ref 80.0–100.0)
Platelets: 227 K/uL (ref 150–400)
RBC: 4.34 MIL/uL (ref 3.87–5.11)
RDW: 14.6 % (ref 11.5–15.5)
WBC: 10 K/uL (ref 4.0–10.5)
nRBC: 0 % (ref 0.0–0.2)

## 2024-02-18 LAB — GLUCOSE, CAPILLARY: Glucose-Capillary: 90 mg/dL (ref 70–99)

## 2024-02-18 MED ORDER — DILTIAZEM HCL ER COATED BEADS 240 MG PO CP24
240.0000 mg | ORAL_CAPSULE | Freq: Every day | ORAL | Status: DC
  Administered 2024-02-19 – 2024-02-22 (×4): 240 mg via ORAL
  Filled 2024-02-18 (×4): qty 1

## 2024-02-18 NOTE — Plan of Care (Signed)

## 2024-02-18 NOTE — Progress Notes (Signed)
  Progress Note  Patient Name: Michelle Dean Date of Encounter: 02/18/2024 [facility] Cardiologist: Madonna Large, DO   Interval Summary    No complaints this morning, hopeful to go home  Vital Signs Vitals:   02/18/24 0013 02/18/24 0420 02/18/24 0730 02/18/24 0754  BP: 109/64 124/65 107/86   Pulse: 71 72 72 71  Resp: 20 17 19    Temp: 98.4 F (36.9 C) 98 F (36.7 C) 98.3 F (36.8 C)   TempSrc: Oral Oral    SpO2: 95% 95% 96% 95%  Weight:      Height:        Intake/Output Summary (Last 24 hours) at 02/18/2024 1022 Last data filed at 02/18/2024 0630 Gross per 24 hour  Intake 180 ml  Output 750 ml  Net -570 ml      02/17/2024   11:07 AM 02/16/2024   10:06 PM 06/12/2023    2:25 PM  Last 3 Weights  Weight (lbs) 186 lb 1.1 oz 184 lb 1.4 oz 184 lb  Weight (kg) 84.4 kg 83.5 kg 83.462 kg      Telemetry/ECG   Clearly back in atrial fibrillation this morning- Personally Reviewed  Physical Exam  GEN: No acute distress.   Neck: No JVD Cardiac: RRR, no murmurs, rubs, or gallops.  Respiratory: Clear to auscultation bilaterally. GI: Soft, nontender, non-distended  MS: No edema  Assessment & Plan   78 year old female with hypertension, hyperlipidemia, PAF, h/o stroke, HFpEF, CKD stage IIIa, admitted with atrial fibrillation with RVR   Paroxysmal atrial fibrillation -- Presented with atrial fibrillation with RVR, on diltiazem  CD1 120 mg daily prior to admission -- Was transition to a diltiazem  drip at 5 mg an hour, rates improved though it was difficult to discern whether she was back in sinus rhythm yesterday afternoon.  EKG was ordered but not obtained -- She is clearly in A-fib this morning but rates are better controlled -- Has been transition back to diltiazem  will increase to 240 mg daily and continue metoprolol  tartrate 25 mg twice daily -- Eliquis  5 mg daily  Hypertension -- As above continue with diltiazem  at 240 mg daily as well as metoprolol  25 mg twice  daily -- Hold home losartan  to allow for AV nodal agents  Chronic HFpEF -- Volume stable on exam -- Continue outpatient Jardiance  10 mg daily  Outpatient follow-up with EP for consideration of ablation  For questions or updates, please contact [facility] Please consult [email or web address] for contact info under   Signed, Manuelita Rummer, NP

## 2024-02-18 NOTE — Plan of Care (Signed)
  Problem: Clinical Measurements: Goal: Will remain free from infection Outcome: Progressing Goal: Respiratory complications will improve Outcome: Progressing Goal: Cardiovascular complication will be avoided Outcome: Progressing   Problem: Elimination: Goal: Will not experience complications related to bowel motility Outcome: Progressing Goal: Will not experience complications related to urinary retention Outcome: Progressing   Problem: Safety: Goal: Ability to remain free from injury will improve Outcome: Progressing

## 2024-02-18 NOTE — Plan of Care (Signed)
 Problem: Education: Goal: Knowledge of General Education information will improve Description: Including pain rating scale, medication(s)/side effects and non-pharmacologic comfort measures 02/18/2024 1000 by Norine Colman BIRCH, RN Outcome: Progressing 02/18/2024 1000 by Norine Colman BIRCH, RN Outcome: Progressing   Problem: Health Behavior/Discharge Planning: Goal: Ability to manage health-related needs will improve 02/18/2024 1000 by Norine Colman BIRCH, RN Outcome: Progressing 02/18/2024 1000 by Norine Colman BIRCH, RN Outcome: Progressing   Problem: Clinical Measurements: Goal: Ability to maintain clinical measurements within normal limits will improve 02/18/2024 1000 by Norine Colman BIRCH, RN Outcome: Progressing 02/18/2024 1000 by Norine Colman BIRCH, RN Outcome: Progressing Goal: Will remain free from infection 02/18/2024 1000 by Norine Colman BIRCH, RN Outcome: Progressing 02/18/2024 1000 by Norine Colman BIRCH, RN Outcome: Progressing Goal: Diagnostic test results will improve 02/18/2024 1000 by Norine Colman BIRCH, RN Outcome: Progressing 02/18/2024 1000 by Norine Colman BIRCH, RN Outcome: Progressing Goal: Respiratory complications will improve 02/18/2024 1000 by Norine Colman BIRCH, RN Outcome: Progressing 02/18/2024 1000 by Norine Colman BIRCH, RN Outcome: Progressing Goal: Cardiovascular complication will be avoided 02/18/2024 1000 by Norine Colman BIRCH, RN Outcome: Progressing 02/18/2024 1000 by Norine Colman BIRCH, RN Outcome: Progressing   Problem: Activity: Goal: Risk for activity intolerance will decrease 02/18/2024 1000 by Norine Colman BIRCH, RN Outcome: Progressing 02/18/2024 1000 by Norine Colman BIRCH, RN Outcome: Progressing   Problem: Nutrition: Goal: Adequate nutrition will be maintained 02/18/2024 1000 by Norine Colman BIRCH, RN Outcome: Progressing 02/18/2024 1000 by Norine Colman D, RN Outcome:  Progressing   Problem: Coping: Goal: Level of anxiety will decrease 02/18/2024 1000 by Norine Colman BIRCH, RN Outcome: Progressing 02/18/2024 1000 by Norine Colman BIRCH, RN Outcome: Progressing   Problem: Elimination: Goal: Will not experience complications related to bowel motility 02/18/2024 1000 by Norine Colman BIRCH, RN Outcome: Progressing 02/18/2024 1000 by Norine Colman BIRCH, RN Outcome: Progressing Goal: Will not experience complications related to urinary retention 02/18/2024 1000 by Norine Colman BIRCH, RN Outcome: Progressing 02/18/2024 1000 by Norine Colman BIRCH, RN Outcome: Progressing   Problem: Pain Managment: Goal: General experience of comfort will improve and/or be controlled 02/18/2024 1000 by Norine Colman D, RN Outcome: Progressing 02/18/2024 1000 by Norine Colman BIRCH, RN Outcome: Progressing   Problem: Safety: Goal: Ability to remain free from injury will improve 02/18/2024 1000 by Norine Colman BIRCH, RN Outcome: Progressing 02/18/2024 1000 by Norine Colman BIRCH, RN Outcome: Progressing   Problem: Skin Integrity: Goal: Risk for impaired skin integrity will decrease 02/18/2024 1000 by Norine Colman BIRCH, RN Outcome: Progressing 02/18/2024 1000 by Norine Colman BIRCH, RN Outcome: Progressing   Problem: Education: Goal: Knowledge of disease or condition will improve 02/18/2024 1000 by Norine Colman BIRCH, RN Outcome: Progressing 02/18/2024 1000 by Norine Colman BIRCH, RN Outcome: Progressing Goal: Understanding of medication regimen will improve 02/18/2024 1000 by Norine Colman BIRCH, RN Outcome: Progressing 02/18/2024 1000 by Norine Colman BIRCH, RN Outcome: Progressing Goal: Individualized Educational Video(s) 02/18/2024 1000 by Norine Colman BIRCH, RN Outcome: Progressing 02/18/2024 1000 by Norine Colman BIRCH, RN Outcome: Progressing   Problem: Activity: Goal: Ability to tolerate increased activity will  improve 02/18/2024 1000 by Norine Colman BIRCH, RN Outcome: Progressing 02/18/2024 1000 by Norine Colman BIRCH, RN Outcome: Progressing   Problem: Cardiac: Goal: Ability to achieve and maintain adequate cardiopulmonary perfusion will improve 02/18/2024 1000 by Norine Colman BIRCH, RN Outcome: Progressing 02/18/2024 1000 by Norine Colman BIRCH, RN Outcome: Progressing   Problem: Health Behavior/Discharge Planning: Goal: Ability to safely manage health-related needs after discharge will improve 02/18/2024 1000 by  Norine Sea D, RN Outcome: Progressing 02/18/2024 1000 by Norine Sea BIRCH, RN Outcome: Progressing

## 2024-02-18 NOTE — Progress Notes (Signed)
 [facility]  PROGRESS NOTE  Michelle Dean FMW:[id number] DOB: 02-19-1946 DOA: 02/15/2024 PCP: Bonney Lynwood BRAVO, MD   Brief HPI:    78 y.o. female with medical history significant for hypertension, hyperlipidemia, CKD 3A, history of CVA, PAF on Eliquis , and chronic HFpEF who presents with palpitations. Patient reports that she was experiencing palpitations on 02/14/2024 but notes that it became more intense on 02/15/2024 prompting her to come to the ER.  No other symptoms, patient reports compliance to medications.  She was found to be in rapid A-fib ordered in the ER.  BNP 254.  Vital signs stable. She was initiated on IV Cardizem  drip.    Assessment/Plan:   1.  Paroxysmal A-fib with RVR - Presents with palpitations found to be in rapid atrial flutter with rate 140, show started on oral Cardizem  yesterday with improvement in heart rate.  Patient was started on IV Cardizem  and p.o. metoprolol .  Cardiology has changed Cardizem  to to 40 mg daily, plan to continue with metoprolol  25 mg p.o. twice daily time.  Continue Eliquis  for stroke risk reduction   - Echocardiogram obtained yesterday showed EF of 60 to 65%, no regional wall motion abnormality.  Left atrial size was mildly dilated.  Normal pulmonary artery systolic pressure   2. Chronic HFpEF  - Appears compensated      3. COPD  - Not in exacerbation  - Continue ICS-LABA and Julia Alkhatib     4. Hx of stroke  - Continue Lipitor and Eliquis      5. CKD 3A  - SCr is 1.35 on admission; baseline may be closer to 1.1 or 1.2  - Renally-dose medications        Medications     apixaban   5 mg Oral BID   atorvastatin   10 mg Oral Daily   diltiazem   180 mg Oral Daily   empagliflozin   10 mg Oral Daily   metoprolol  tartrate  25 mg Oral BID   pantoprazole   40 mg Oral Daily   sodium chloride  flush  3 mL Intravenous Q12H   umeclidinium bromide   1 puff Inhalation Daily     Data Reviewed:   CBG:  Recent Labs  Lab 02/18/24 0629  GLUCAP 90     SpO2: 95 %    Vitals:   02/17/24 2101 02/18/24 0013 02/18/24 0420 02/18/24 0754  BP: 134/68 109/64 124/65   Pulse: 73 71 72 71  Resp:  20 17   Temp:  98.4 F (36.9 C) 98 F (36.7 C)   TempSrc:  Oral Oral   SpO2:  95% 95% 95%  Weight:      Height:          Data Reviewed:  Basic Metabolic Panel: Recent Labs  Lab 02/15/24 2330 02/16/24 0500 02/17/24 0401 02/18/24 0233  NA 143 140 140 139  K 3.5 4.9 4.0 4.5  CL 109 113* 107 108  CO2 23 20* 24 20*  GLUCOSE 109* 100* 90 90  BUN 17 13 17 21   CREATININE 1.35* 1.19* 1.30* 1.29*  CALCIUM  8.0* 8.2* 8.7* 8.6*  MG 1.9 2.2  --   --     CBC: Recent Labs  Lab 02/15/24 2330 02/16/24 0500 02/17/24 0401 02/18/24 0233  WBC 11.2* 9.9 9.1 10.0  NEUTROABS 6.9  --   --   --   HGB 13.0 12.4 12.6 12.7  HCT 42.1 39.5 39.7 40.2  MCV 94.8 95.4 93.2 92.6  PLT 258 259 235 227    LFT Recent Labs  Lab 02/15/24 2330  AST 20  ALT 11  ALKPHOS 56  BILITOT 0.3  PROT 5.7*  ALBUMIN 3.2*     Antibiotics: Anti-infectives (From admission, onward)    None        DVT prophylaxis: Apixaban   Code Status: Full code  Family Communication:    CONSULTS    Subjective   Denies chest pain or shortness of breath.  Denies palpitations  Objective    Physical Examination:  General-appears in no acute distress Heart-irregularly irregular rhythm Lungs-clear to auscultation bilaterally, no wheezing or crackles auscultated Abdomen-soft, nontender, no organomegaly Extremities-no edema in the lower extremities Neuro-alert, oriented x3, no focal deficit noted    Status is: Inpatient:             [name]   [facility] If 7PM-7AM, please contact night-coverage at [email or web address], Office  [phone]   02/18/2024, 8:06 AM  LOS: 2 days

## 2024-02-19 DIAGNOSIS — I4892 Unspecified atrial flutter: Secondary | ICD-10-CM | POA: Diagnosis not present

## 2024-02-19 DIAGNOSIS — I480 Paroxysmal atrial fibrillation: Secondary | ICD-10-CM | POA: Diagnosis not present

## 2024-02-19 DIAGNOSIS — Z8673 Personal history of transient ischemic attack (TIA), and cerebral infarction without residual deficits: Secondary | ICD-10-CM | POA: Diagnosis not present

## 2024-02-19 DIAGNOSIS — J449 Chronic obstructive pulmonary disease, unspecified: Secondary | ICD-10-CM | POA: Diagnosis not present

## 2024-02-19 LAB — BASIC METABOLIC PANEL WITH GFR
Anion gap: 10 (ref 5–15)
BUN: 25 mg/dL — ABNORMAL HIGH (ref 8–23)
CO2: 23 mmol/L (ref 22–32)
Calcium: 8.5 mg/dL — ABNORMAL LOW (ref 8.9–10.3)
Chloride: 107 mmol/L (ref 98–111)
Creatinine, Ser: 1.3 mg/dL — ABNORMAL HIGH (ref 0.44–1.00)
GFR, Estimated: 42 mL/min — ABNORMAL LOW (ref >60)
Glucose, Bld: 89 mg/dL (ref 70–99)
Potassium: 4 mmol/L (ref 3.5–5.1)
Sodium: 140 mmol/L (ref 135–145)

## 2024-02-19 LAB — CBC
HCT: 41.7 % (ref 36.0–46.0)
Hemoglobin: 13.2 g/dL (ref 12.0–15.0)
MCH: 29.1 pg (ref 26.0–34.0)
MCHC: 31.7 g/dL (ref 30.0–36.0)
MCV: 92.1 fL (ref 80.0–100.0)
Platelets: 255 K/uL (ref 150–400)
RBC: 4.53 MIL/uL (ref 3.87–5.11)
RDW: 14.4 % (ref 11.5–15.5)
WBC: 9.7 K/uL (ref 4.0–10.5)
nRBC: 0 % (ref 0.0–0.2)

## 2024-02-19 NOTE — Progress Notes (Signed)
 [facility]  PROGRESS NOTE  Michelle Dean FMW:[id number] DOB: 11/27/1945 DOA: 02/15/2024 PCP: Bonney Lynwood BRAVO, MD   Brief HPI:    78 y.o. female with medical history significant for hypertension, hyperlipidemia, CKD 3A, history of CVA, PAF on Eliquis , and chronic HFpEF who presents with palpitations. Patient reports that she was experiencing palpitations on 02/14/2024 but notes that it became more intense on 02/15/2024 prompting her to come to the ER.  No other symptoms, patient reports compliance to medications.  She was found to be in rapid A-fib ordered in the ER.  BNP 254.  Vital signs stable. She was initiated on IV Cardizem  drip.    Assessment/Plan:   1.  Paroxysmal A-fib with RVR - Presented with palpitations found to be in rapid atrial flutter with rate 140, show started on oral Cardizem  yesterday with improvement in heart rate.  Patient was started on IV Cardizem  and p.o. metoprolol .  Cardiology has changed Cardizem  to 240 mg daily, plan to continue with metoprolol  25 mg p.o. twice daily time.  Continue Eliquis  for stroke risk reduction   - Echocardiogram obtained yesterday showed EF of 60 to 65%, no regional wall motion abnormality.  Left atrial size was mildly dilated.  Normal pulmonary artery systolic pressure  - Again consulted cardiology, plan to continue with Cardizem  to 40 mg daily, metoprolol  25 mg p.o.  Twice daily. - If no improvement of rate with above measures then likely will need IV amiodarone . - Plan for possible cardioversion on Monday.  2. Chronic HFpEF  - Appears compensated      3. COPD  - Not in exacerbation  - Continue ICS-LABA and Cable Fearn     4. Hx of stroke  - Continue Lipitor and Eliquis      5. CKD 3A  - SCr is 1.35 on admission; baseline may be closer to 1.1 or 1.2  - Renally-dose medications   -Creatinine stable at 1.30      Medications     apixaban   5 mg Oral BID   atorvastatin   10 mg Oral Daily   diltiazem   240 mg Oral Daily    empagliflozin   10 mg Oral Daily   metoprolol  tartrate  25 mg Oral BID   pantoprazole   40 mg Oral Daily   sodium chloride  flush  3 mL Intravenous Q12H   umeclidinium bromide   1 puff Inhalation Daily     Data Reviewed:   CBG:  Recent Labs  Lab 02/18/24 0629  GLUCAP 90    SpO2: 96 %    Vitals:   02/18/24 2052 02/19/24 0103 02/19/24 0104 02/19/24 0528  BP: 133/82  122/63 120/72  Pulse: 73   73  Resp:   18 18  Temp: 98 F (36.7 C)  98 F (36.7 C) 97.8 F (36.6 C)  TempSrc: Oral   Oral  SpO2: 97% 95%  96%  Weight:      Height:          Data Reviewed:  Basic Metabolic Panel: Recent Labs  Lab 02/15/24 2330 02/16/24 0500 02/17/24 0401 02/18/24 0233 02/19/24 0248  NA 143 140 140 139 140  K 3.5 4.9 4.0 4.5 4.0  CL 109 113* 107 108 107  CO2 23 20* 24 20* 23  GLUCOSE 109* 100* 90 90 89  BUN 17 13 17 21  25*  CREATININE 1.35* 1.19* 1.30* 1.29* 1.30*  CALCIUM  8.0* 8.2* 8.7* 8.6* 8.5*  MG 1.9 2.2  --   --   --  CBC: Recent Labs  Lab 02/15/24 2330 02/16/24 0500 02/17/24 0401 02/18/24 0233 02/19/24 0248  WBC 11.2* 9.9 9.1 10.0 9.7  NEUTROABS 6.9  --   --   --   --   HGB 13.0 12.4 12.6 12.7 13.2  HCT 42.1 39.5 39.7 40.2 41.7  MCV 94.8 95.4 93.2 92.6 92.1  PLT 258 259 235 227 255    LFT Recent Labs  Lab 02/15/24 2330  AST 20  ALT 11  ALKPHOS 56  BILITOT 0.3  PROT 5.7*  ALBUMIN 3.2*     Antibiotics: Anti-infectives (From admission, onward)    None        DVT prophylaxis: Apixaban   Code Status: Full code  Family Communication:    CONSULTS    Subjective   Patient went into A-fib with RVR this morning.  Rate went to 150s on ambulation.  Objective    Physical Examination:  General-appears in no acute distress Heart-irregular rhythm Lungs-clear to auscultation bilaterally, no wheezing or crackles auscultated Abdomen-soft, nontender, no organomegaly Extremities-no edema in the lower extremities Neuro-alert, oriented x3, no  focal deficit noted  Status is: Inpatient:             [name]   [facility] If 7PM-7AM, please contact night-coverage at [email or web address], Office  [phone]   02/19/2024, 7:54 AM  LOS: 3 days

## 2024-02-19 NOTE — Progress Notes (Signed)
   Patient Name: Michelle Dean Date of Encounter: 02/19/2024 [facility] Cardiologist: Madonna Large, DO   Interval Summary  .    Palpitations today  Vital Signs .    Vitals:   02/19/24 0104 02/19/24 0528 02/19/24 0717 02/19/24 0822  BP: 122/63 120/72 132/74   Pulse:  73 73 79  Resp: 18 18 (!) 22 20  Temp: 98 F (36.7 C) 97.8 F (36.6 C) 97.6 F (36.4 C)   TempSrc:  Oral Oral   SpO2:  96% 95% 95%  Weight:      Height:        Intake/Output Summary (Last 24 hours) at 02/19/2024 1309 Last data filed at 02/19/2024 1100 Gross per 24 hour  Intake 480 ml  Output 750 ml  Net -270 ml      02/17/2024   11:07 AM 02/16/2024   10:06 PM 06/12/2023    2:25 PM  Last 3 Weights  Weight (lbs) 186 lb 1.1 oz 184 lb 1.4 oz 184 lb  Weight (kg) 84.4 kg 83.5 kg 83.462 kg      Telemetry/ECG    02/19/2024: - Personally Reviewed Back in Afib w/RVR Prior to that, she was regular in 70s but w/no P waves, possible atrial flutter      Physical Exam .   Physical Exam Vitals and nursing note reviewed.  Constitutional:      General: She is not in acute distress. Neck:     Vascular: No JVD.  Cardiovascular:     Rate and Rhythm: Tachycardia present. Rhythm irregular.     Heart sounds: Normal heart sounds. No murmur heard. Pulmonary:     Effort: Pulmonary effort is normal.     Breath sounds: Normal breath sounds. No wheezing or rales.      Assessment & Plan .     78 year old female with hypertension, hyperlipidemia, PAF, h/o stroke, HFpEF, CKD stage IIIa, admitted with atrial flutter with RVR   Paroxysmal A-fib: Back in A-fib with RVR. Even when she was regular in 70s, it appears that she may have been in some form of atrial flutter. For rate control currently on diltiazem  180 mg daily, metoprolol  succinate 50 mg daily. She did not receive 240 mg daily yesterday, no change today. Would like to do cardioversion, but cannot do so for 3 days given that she has been on  Jardiance . Keep inpatient with plans to do cardioversion on Monday. Continue Eliquis  5 mg twice daily. Discussed with EP, who have schedule outpatient follow-up.  If rate does not get controlled after receiving diltiazem  improved and 40 mg daily, will start IV amiodarone .   Hypertension: Controlled.  Continue losartan  50 mg daily.   HFpEF: Prior history, currently not in acute heart failure. Holding Jardiance  given plans for cardioversion on Monday.    For questions or updates, please contact [facility] Please consult [email or web address] for contact info under        Signed, Newman JINNY Lawrence, MD

## 2024-02-19 NOTE — TOC CM/SW Note (Signed)
 Transition of Care [facility]) - Inpatient Brief Assessment   Patient Details  Name: Michelle Dean MRN: [id number] Date of Birth: 1945/08/08  [facility] CM/SW Contact:    Waddell Barnie Rama, RN Phone Number: 02/19/2024, 4:11 PM   Clinical Narrative: From home alone, has PCP and insurance on file, states has no HH services in place at this time , has home oxygen 1 liter prn at home.  States family member (son)  will transport them home at [employer] and family is support system, states gets medications from [facility] in [facility].  Pta self ambulatory.   There are no ICM needs identified  at this time.  Please place consult for ICM needs.     Transition of Care Asessment: Insurance and Status: Insurance coverage has been reviewed Patient has primary care physician: Yes Home environment has been reviewed: home alone Prior level of function:: indep Prior/Current Home Services: Current home services (home oxygen 1 liter) Social Drivers of Health Review: SDOH reviewed no interventions necessary Readmission risk has been reviewed: Yes Transition of care needs: no transition of care needs at this time

## 2024-02-20 DIAGNOSIS — I5032 Chronic diastolic (congestive) heart failure: Secondary | ICD-10-CM | POA: Diagnosis not present

## 2024-02-20 DIAGNOSIS — J449 Chronic obstructive pulmonary disease, unspecified: Secondary | ICD-10-CM | POA: Diagnosis not present

## 2024-02-20 DIAGNOSIS — I4892 Unspecified atrial flutter: Secondary | ICD-10-CM | POA: Diagnosis not present

## 2024-02-20 DIAGNOSIS — I10 Essential (primary) hypertension: Secondary | ICD-10-CM | POA: Diagnosis not present

## 2024-02-20 MED ORDER — FLECAINIDE ACETATE 50 MG PO TABS
50.0000 mg | ORAL_TABLET | Freq: Two times a day (BID) | ORAL | Status: DC
  Administered 2024-02-20 – 2024-02-21 (×4): 50 mg via ORAL
  Filled 2024-02-20 (×6): qty 1

## 2024-02-20 NOTE — Progress Notes (Signed)
 Mobility Specialist Progress Note;   02/20/24 0908  Mobility  Activity Ambulated with assistance (from bathroom)  Level of Assistance Contact guard assist, steadying assist  Assistive Device None  Distance Ambulated (ft) 8 ft  Activity Response Tolerated well  Mobility Referral Yes  Mobility visit 1 Mobility  Mobility Specialist Start Time (ACUTE ONLY) 0908  Mobility Specialist Stop Time (ACUTE ONLY) 0912  Mobility Specialist Time Calculation (min) (ACUTE ONLY) 4 min   Answered pts call light requesting assistance from BR. Required MinG assistance for safety. Deferred hallway ambulation, stating she just wanted to lay down. Pt returned to bed and left with all needs met.   Lauraine Erm Mobility Specialist Please contact via SecureChat or [employer] [phone]

## 2024-02-20 NOTE — Progress Notes (Addendum)
  Progress Note  Patient Name: Michelle Dean Date of Encounter: 02/20/2024 [facility] Cardiologist: Madonna Large, DO   Interval Summary   Feels palpitations with her heart rates are rapid, but otherwise without major complaint  Vital Signs Vitals:   02/19/24 1918 02/19/24 2325 02/20/24 0528 02/20/24 0727  BP: 123/72 (!) 98/55 103/60 120/63  Pulse: (!) 50 75 74 74  Resp: 19 19 19 18   Temp: 98 F (36.7 C) 97.8 F (36.6 C) 97.7 F (36.5 C) 97.9 F (36.6 C)  TempSrc: Oral Oral Oral Oral  SpO2: 95% 97% 93% 93%  Weight:      Height:        Intake/Output Summary (Last 24 hours) at 02/20/2024 0841 Last data filed at 02/19/2024 2325 Gross per 24 hour  Intake 477 ml  Output 650 ml  Net -173 ml      02/17/2024   11:07 AM 02/16/2024   10:06 PM 06/12/2023    2:25 PM  Last 3 Weights  Weight (lbs) 186 lb 1.1 oz 184 lb 1.4 oz 184 lb  Weight (kg) 84.4 kg 83.5 kg 83.462 kg      Telemetry/ECG  Atrial fibrillation- Personally Reviewed  Physical Exam  GEN: No acute distress.   Neck: No JVD Cardiac: Irregular, no murmurs, rubs, or gallops.  Respiratory: Clear to auscultation bilaterally. GI: Soft, nontender, non-distended  MS: No edema  Assessment & Plan   1.  Persistent atrial fibrillation: Has had rapid rates.  On both diltiazem  and metoprolol  with improvement in her heart rates.  Continue Eliquis .  She has plans for cardioversion on Monday.  She has a normal ejection fraction and no obvious coronary artery disease or coronary symptoms.  Michelle Dean start flecainide 50 mg twice daily today.  2.  Secondary hypercoagulable state: Continue Eliquis   3.  Hypertension: Continue losartan   4.  Chronic diastolic heart failure: No acute volume overload.  Holding Jardiance  for cardioversion   For questions or updates, please contact [facility] Please consult [email or web address] for contact info under         Signed, Desi Carby Gladis Norton, MD

## 2024-02-20 NOTE — Progress Notes (Signed)
 [facility]  PROGRESS NOTE  Michelle Dean FMW:[id number] DOB: [birth year] DOA: 02/15/2024 PCP: Bonney Lynwood BRAVO, MD   Brief HPI:    78 y.o. female with medical history significant for hypertension, hyperlipidemia, CKD 3A, history of CVA, PAF on Eliquis , and chronic HFpEF who presents with palpitations. Patient reports that she was experiencing palpitations on 02/14/2024 but notes that it became more intense on 02/15/2024 prompting her to come to the ER.  No other symptoms, patient reports compliance to medications.  She was found to be in rapid A-fib ordered in the ER.  BNP 254.  Vital signs stable. She was initiated on IV Cardizem  drip.    Assessment/Plan:   1.  Paroxysmal A-fib with RVR - Presented with palpitations found to be in rapid atrial flutter with rate 140, show started on oral Cardizem  yesterday with improvement in heart rate.  Patient was started on IV Cardizem  and p.o. metoprolol .  Cardiology has changed Cardizem  to 240 mg daily, plan to continue with metoprolol  25 mg p.o. twice daily time.  Continue Eliquis  for stroke risk reduction   - Echocardiogram obtained yesterday showed EF of 60 to 65%, no regional wall motion abnormality.  Left atrial size was mildly dilated.  Normal pulmonary artery systolic pressure  - Again consulted cardiology, plan to continue with Cardizem  to 40 mg daily, metoprolol  25 mg p.o.  Twice daily. - If no improvement of rate with above measures then likely will need IV amiodarone . - Plan for possible cardioversion on Monday.  2. Chronic HFpEF  - Appears compensated      3. COPD  - Not in exacerbation  - Continue ICS-LABA and Michelle Dean     4. Hx of stroke  - Continue Lipitor and Eliquis      5. CKD 3A  - SCr is 1.35 on admission; baseline may be closer to 1.1 or 1.2  - Renally-dose medications   -Creatinine stable at 1.30      Medications     apixaban   5 mg Oral BID   atorvastatin   10 mg Oral Daily   diltiazem   240 mg Oral Daily    flecainide  50 mg Oral Q12H   metoprolol  tartrate  25 mg Oral BID   pantoprazole   40 mg Oral Daily   sodium chloride  flush  3 mL Intravenous Q12H   umeclidinium bromide   1 puff Inhalation Daily     Data Reviewed:   CBG:  Recent Labs  Lab 02/18/24 0629  GLUCAP 90    SpO2: 96 %    Vitals:   02/19/24 2325 02/20/24 0528 02/20/24 0727 02/20/24 1126  BP: (!) 98/55 103/60 120/63 113/61  Pulse: 75 74 74 73  Resp:  19 18 16   Temp: 97.8 F (36.6 C) 97.7 F (36.5 C) 97.9 F (36.6 C) 98.1 F (36.7 C)  TempSrc: Oral Oral Oral Oral  SpO2:  93% 93% 96%  Weight:      Height:          Data Reviewed:  Basic Metabolic Panel: Recent Labs  Lab 02/15/24 2330 02/16/24 0500 02/17/24 0401 02/18/24 0233 02/19/24 0248  NA 143 140 140 139 140  K 3.5 4.9 4.0 4.5 4.0  CL 109 113* 107 108 107  CO2 23 20* 24 20* 23  GLUCOSE 109* 100* 90 90 89  BUN 17 13 17 21  25*  CREATININE 1.35* 1.19* 1.30* 1.29* 1.30*  CALCIUM  8.0* 8.2* 8.7* 8.6* 8.5*  MG 1.9 2.2  --   --   --  CBC: Recent Labs  Lab 02/15/24 2330 02/16/24 0500 02/17/24 0401 02/18/24 0233 02/19/24 0248  WBC 11.2* 9.9 9.1 10.0 9.7  NEUTROABS 6.9  --   --   --   --   HGB 13.0 12.4 12.6 12.7 13.2  HCT 42.1 39.5 39.7 40.2 41.7  MCV 94.8 95.4 93.2 92.6 92.1  PLT 258 259 235 227 255    LFT Recent Labs  Lab 02/15/24 2330  AST 20  ALT 11  ALKPHOS 56  BILITOT 0.3  PROT 5.7*  ALBUMIN 3.2*     Antibiotics: Anti-infectives (From admission, onward)    None        DVT prophylaxis: Apixaban   Code Status: Full code  Family Communication:    CONSULTS    Subjective   Denies chest pain or shortness of breath.  No palpitations  Objective    Physical Examination:  Appears in no acute distress Heart-S1-S2, regular rhythm Lungs clear to auscultation bilaterally Abdomen is soft, nontender, no organomegaly  Status is: Inpatient:             Michelle Dean   [facility] If 7PM-7AM,  please contact night-coverage at [email or web address], Office  [phone]   02/20/2024, 2:43 PM  LOS: 4 days

## 2024-02-20 NOTE — Plan of Care (Signed)

## 2024-02-20 NOTE — Plan of Care (Signed)
   Problem: Health Behavior/Discharge Planning: Goal: Ability to manage health-related needs will improve Outcome: Progressing   Problem: Clinical Measurements: Goal: Ability to maintain clinical measurements within normal limits will improve Outcome: Progressing   Problem: Clinical Measurements: Goal: Will remain free from infection Outcome: Progressing

## 2024-02-21 DIAGNOSIS — N1831 Chronic kidney disease, stage 3a: Secondary | ICD-10-CM | POA: Diagnosis not present

## 2024-02-21 DIAGNOSIS — Z8673 Personal history of transient ischemic attack (TIA), and cerebral infarction without residual deficits: Secondary | ICD-10-CM | POA: Diagnosis not present

## 2024-02-21 DIAGNOSIS — I5032 Chronic diastolic (congestive) heart failure: Secondary | ICD-10-CM | POA: Diagnosis not present

## 2024-02-21 DIAGNOSIS — I4892 Unspecified atrial flutter: Secondary | ICD-10-CM | POA: Diagnosis not present

## 2024-02-21 NOTE — Plan of Care (Signed)

## 2024-02-21 NOTE — Progress Notes (Signed)
 Patient's nephew, Dorsey, called to request update on patient status. Patient requested that updates only be given to her son, and asked this RN to not call nephew back.

## 2024-02-21 NOTE — Progress Notes (Signed)
 [facility]  PROGRESS NOTE  Michelle Dean FMW:[id number] DOB: 08/24/1945 DOA: 02/15/2024 PCP: Bonney Lynwood BRAVO, MD   Brief HPI:    78 y.o. female with medical history significant for hypertension, hyperlipidemia, CKD 3A, history of CVA, PAF on Eliquis , and chronic HFpEF who presents with palpitations. Patient reports that she was experiencing palpitations on 02/14/2024 but notes that it became more intense on 02/15/2024 prompting her to come to the ER.  No other symptoms, patient reports compliance to medications.  She was found to be in rapid A-fib ordered in the ER.  BNP 254.  Vital signs stable. She was initiated on IV Cardizem  drip.    Assessment/Plan:   1.  Paroxysmal A-fib with RVR -Presented with rapid A-fib with heart rate in 140s -Started on IV Cardizem  and p.o. metoprolol  -Switch to p.o. metoprolol  and Cardizem  however rate continued to be intermittently elevated to 150s - Echocardiogram obtained yesterday showed EF of 60 to 65%, no regional wall motion abnormality.  Left atrial size was mildly dilated.  Normal pulmonary artery systolic pressure  -Cardiology consulted, despite treatment with Cardizem  and metoprolol , patient's heart rate intermittently jumps to 150s -Continue anticoagulation with Eliquis .  For stroke prevention.   -Plan for cardioversion on Monday  2. Chronic HFpEF  - Appears compensated      3. COPD  - Not in exacerbation  - Continue ICS-LABA and Makyia Erxleben     4. Hx of stroke  - Continue Lipitor and Eliquis      5. CKD 3A  - SCr is 1.35 on admission; baseline may be closer to 1.1 or 1.2  - Renally-dose medications   -Creatinine stable at 1.30      Medications     apixaban   5 mg Oral BID   atorvastatin   10 mg Oral Daily   diltiazem   240 mg Oral Daily   flecainide  50 mg Oral Q12H   metoprolol  tartrate  25 mg Oral BID   pantoprazole   40 mg Oral Daily   sodium chloride  flush  3 mL Intravenous Q12H   umeclidinium bromide   1 puff Inhalation Daily      Data Reviewed:   CBG:  Recent Labs  Lab 02/18/24 0629  GLUCAP 90    SpO2: 94 %    Vitals:   02/20/24 2110 02/21/24 0100 02/21/24 0435 02/21/24 0728  BP: 129/65 (!) 108/58 (!) 104/56 123/87  Pulse: 91 63  85  Resp:  19 16 20   Temp:  97.8 F (36.6 C) 97.7 F (36.5 C) 98.4 F (36.9 C)  TempSrc:  Oral Oral Oral  SpO2:  97% 94% 94%  Weight:      Height:          Data Reviewed:  Basic Metabolic Panel: Recent Labs  Lab 02/15/24 2330 02/16/24 0500 02/17/24 0401 02/18/24 0233 02/19/24 0248  NA 143 140 140 139 140  K 3.5 4.9 4.0 4.5 4.0  CL 109 113* 107 108 107  CO2 23 20* 24 20* 23  GLUCOSE 109* 100* 90 90 89  BUN 17 13 17 21  25*  CREATININE 1.35* 1.19* 1.30* 1.29* 1.30*  CALCIUM  8.0* 8.2* 8.7* 8.6* 8.5*  MG 1.9 2.2  --   --   --     CBC: Recent Labs  Lab 02/15/24 2330 02/16/24 0500 02/17/24 0401 02/18/24 0233 02/19/24 0248  WBC 11.2* 9.9 9.1 10.0 9.7  NEUTROABS 6.9  --   --   --   --   HGB 13.0 12.4 12.6 12.7  13.2  HCT 42.1 39.5 39.7 40.2 41.7  MCV 94.8 95.4 93.2 92.6 92.1  PLT 258 259 235 227 255    LFT Recent Labs  Lab 02/15/24 2330  AST 20  ALT 11  ALKPHOS 56  BILITOT 0.3  PROT 5.7*  ALBUMIN 3.2*     Antibiotics: Anti-infectives (From admission, onward)    None        DVT prophylaxis: Apixaban   Code Status: Full code  Family Communication: No family present at bedside   CONSULTS    Subjective   Denies chest pain or palpitations  Objective    Physical Examination:  Appears in no acute distress Heart-regular rhythm, no murmur auscultated Abdomen is soft, nontender Lungs are clear to auscultation bilaterally  Status is: Inpatient:             [name]   [facility] If 7PM-7AM, please contact night-coverage at [email or web address], Office  [phone]   02/21/2024, 7:52 AM  LOS: 5 days

## 2024-02-21 NOTE — Progress Notes (Signed)
  Progress Note  Patient Name: Lanecia Sliva Date of Encounter: 02/21/2024 [facility] Cardiologist: Madonna Large, DO   Interval Summary   Continuing to feel well.  Ready for cardioversion tomorrow.  No acute complaints.  Vital Signs Vitals:   02/21/24 0100 02/21/24 0435 02/21/24 0728 02/21/24 0828  BP: (!) 108/58 (!) 104/56 123/87   Pulse: 63  85 85  Resp: 19 16 20 18   Temp: 97.8 F (36.6 C) 97.7 F (36.5 C) 98.4 F (36.9 C)   TempSrc: Oral Oral Oral   SpO2: 97% 94% 94%   Weight:      Height:        Intake/Output Summary (Last 24 hours) at 02/21/2024 0858 Last data filed at 02/20/2024 1525 Gross per 24 hour  Intake 240 ml  Output 200 ml  Net 40 ml      02/17/2024   11:07 AM 02/16/2024   10:06 PM 06/12/2023    2:25 PM  Last 3 Weights  Weight (lbs) 186 lb 1.1 oz 184 lb 1.4 oz 184 lb  Weight (kg) 84.4 kg 83.5 kg 83.462 kg      Telemetry/ECG  Atrial fibrillation- Personally Reviewed  Physical Exam  GEN: No acute distress.   Neck: No JVD Cardiac: Irregular, no murmurs, rubs, or gallops.  Respiratory: Clear to auscultation bilaterally. GI: Soft, nontender, non-distended  MS: No edema  Assessment & Plan   1.  Persistent atrial fibrillation: Has had rapid rates.  Now on metoprolol , diltiazem , flecainide.  She continues to have episodes of atrial fibrillation.  [place] plan for cardioversion tomorrow.  2.  Secondary hypercoagulable state: On Eliquis   3.  Hypertension: Continue losartan   4.  Chronic diastolic heart failure: No obvious volume overload.  Holding Jardiance  for cardioversion   For questions or updates, please contact [facility] Please consult [email or web address] for contact info under         Signed, Lavenia Stumpo Gladis Norton, MD

## 2024-02-22 ENCOUNTER — Encounter (HOSPITAL_COMMUNITY)
Admission: EM | Disposition: A | Discharge status: Home / Self Care | Payer: Self-pay | Source: Home / Self Care | Attending: Family Medicine

## 2024-02-22 ENCOUNTER — Inpatient Hospital Stay (HOSPITAL_COMMUNITY): Admitting: Anesthesiology

## 2024-02-22 DIAGNOSIS — I5032 Chronic diastolic (congestive) heart failure: Secondary | ICD-10-CM

## 2024-02-22 DIAGNOSIS — Z006 Encounter for examination for normal comparison and control in clinical research program: Secondary | ICD-10-CM

## 2024-02-22 DIAGNOSIS — N1831 Chronic kidney disease, stage 3a: Secondary | ICD-10-CM | POA: Diagnosis not present

## 2024-02-22 DIAGNOSIS — I4892 Unspecified atrial flutter: Secondary | ICD-10-CM | POA: Diagnosis not present

## 2024-02-22 DIAGNOSIS — J449 Chronic obstructive pulmonary disease, unspecified: Secondary | ICD-10-CM | POA: Diagnosis not present

## 2024-02-22 DIAGNOSIS — I130 Hypertensive heart and chronic kidney disease with heart failure and stage 1 through stage 4 chronic kidney disease, or unspecified chronic kidney disease: Secondary | ICD-10-CM

## 2024-02-22 DIAGNOSIS — I4891 Unspecified atrial fibrillation: Secondary | ICD-10-CM

## 2024-02-22 HISTORY — PX: CARDIOVERSION: EP1203

## 2024-02-22 SURGERY — CARDIOVERSION (CATH LAB)
Anesthesia: General

## 2024-02-22 MED ORDER — METOPROLOL SUCCINATE ER 25 MG PO TB24: 12.5000 mg | ORAL_TABLET | Freq: Every day | ORAL | 3 refills | Status: DC

## 2024-02-22 MED ORDER — PROPOFOL 10 MG/ML IV BOLUS
INTRAVENOUS | Status: DC | PRN
  Administered 2024-02-22: 50 mg via INTRAVENOUS

## 2024-02-22 MED ORDER — DILTIAZEM HCL ER COATED BEADS 120 MG PO CP24: 120.0000 mg | ORAL_CAPSULE | Freq: Every evening | ORAL | 2 refills | Status: AC

## 2024-02-22 MED ORDER — SODIUM CHLORIDE 0.9 % IV SOLN: INTRAVENOUS | Status: DC

## 2024-02-22 MED ORDER — LIDOCAINE 2% (20 MG/ML) 5 ML SYRINGE
INTRAMUSCULAR | Status: DC | PRN
  Administered 2024-02-22: 50 mg via INTRAVENOUS

## 2024-02-22 MED ORDER — FLECAINIDE ACETATE 50 MG PO TABS: 50.0000 mg | ORAL_TABLET | Freq: Two times a day (BID) | ORAL | 2 refills | Status: AC

## 2024-02-22 SURGICAL SUPPLY — 1 items: PAD DEFIB RADIO PHYSIO CONN (PAD) ×1 IMPLANT

## 2024-02-22 NOTE — Progress Notes (Incomplete)
 [facility]  PROGRESS NOTE  Michelle Dean FMW:[id number] DOB: 20-Aug-1945 DOA: 02/15/2024 PCP: Bonney Lynwood BRAVO, MD   Brief HPI:    77 y.o. female with medical history significant for hypertension, hyperlipidemia, CKD 3A, history of CVA, PAF on Eliquis , and chronic HFpEF who presents with palpitations. Patient reports that she was experiencing palpitations on 02/14/2024 but notes that it became more intense on 02/15/2024 prompting her to come to the ER.  No other symptoms, patient reports compliance to medications.  She was found to be in rapid A-fib ordered in the ER.  BNP 254.  Vital signs stable. She was initiated on IV Cardizem  drip.    Assessment/Plan:   1.  Paroxysmal A-fib with RVR -Presented with rapid A-fib with heart rate in 140s -Started on IV Cardizem  and p.o. metoprolol  -Switch to p.o. metoprolol  and Cardizem  however rate continued to be intermittently elevated to 150s - Echocardiogram obtained yesterday showed EF of 60 to 65%, no regional wall motion abnormality.  Left atrial size was mildly dilated.  Normal pulmonary artery systolic pressure  -Cardiology consulted, despite treatment with Cardizem  and metoprolol , patient's heart rate intermittently jumps to 150s -Continue anticoagulation with Eliquis .  For stroke prevention.   -Plan for cardioversion on Monday  2. Chronic HFpEF  - Appears compensated      3. COPD  - Not in exacerbation  - Continue ICS-LABA and Takera Rayl     4. Hx of stroke  - Continue Lipitor and Eliquis      5. CKD 3A  - SCr is 1.35 on admission; baseline may be closer to 1.1 or 1.2  - Renally-dose medications   -Creatinine stable at 1.30      Medications     apixaban   5 mg Oral BID   atorvastatin   10 mg Oral Daily   diltiazem   240 mg Oral Daily   flecainide  50 mg Oral Q12H   metoprolol  tartrate  25 mg Oral BID   pantoprazole   40 mg Oral Daily   sodium chloride  flush  3 mL Intravenous Q12H   umeclidinium bromide   1 puff Inhalation Daily      Data Reviewed:   CBG:  Recent Labs  Lab 02/18/24 0629  GLUCAP 90    SpO2: 97 %    Vitals:   02/21/24 1113 02/21/24 1517 02/21/24 2141 02/22/24 0500  BP: 99/66 (!) 98/58 110/71 107/69  Pulse: 67 (!) 55 71   Resp: 20 17 20 19   Temp: 98.5 F (36.9 C) 98.4 F (36.9 C) 98.1 F (36.7 C) 97.7 F (36.5 C)  TempSrc: Oral Oral Oral Oral  SpO2: 95% 97% 94% 97%  Weight:      Height:          Data Reviewed:  Basic Metabolic Panel: Recent Labs  Lab 02/15/24 2330 02/16/24 0500 02/17/24 0401 02/18/24 0233 02/19/24 0248  NA 143 140 140 139 140  K 3.5 4.9 4.0 4.5 4.0  CL 109 113* 107 108 107  CO2 23 20* 24 20* 23  GLUCOSE 109* 100* 90 90 89  BUN 17 13 17 21  25*  CREATININE 1.35* 1.19* 1.30* 1.29* 1.30*  CALCIUM  8.0* 8.2* 8.7* 8.6* 8.5*  MG 1.9 2.2  --   --   --     CBC: Recent Labs  Lab 02/15/24 2330 02/16/24 0500 02/17/24 0401 02/18/24 0233 02/19/24 0248  WBC 11.2* 9.9 9.1 10.0 9.7  NEUTROABS 6.9  --   --   --   --   HGB 13.0  12.4 12.6 12.7 13.2  HCT 42.1 39.5 39.7 40.2 41.7  MCV 94.8 95.4 93.2 92.6 92.1  PLT 258 259 235 227 255    LFT Recent Labs  Lab 02/15/24 2330  AST 20  ALT 11  ALKPHOS 56  BILITOT 0.3  PROT 5.7*  ALBUMIN 3.2*     Antibiotics: Anti-infectives (From admission, onward)    None        DVT prophylaxis: Apixaban   Code Status: Full code  Family Communication: No family present at bedside   CONSULTS    Subjective     Objective    Physical Examination:   Status is: Inpatient:             Michelle Dean   [facility] If 7PM-7AM, please contact night-coverage at [email or web address], Office  [phone]   02/22/2024, 7:39 AM  LOS: 6 days

## 2024-02-22 NOTE — Op Note (Signed)
 Procedure: Electrical Cardioversion Indications:  Atrial Flutter  Procedure Details:  Consent: Risks of procedure as well as the alternatives and risks of each were explained to the (patient/caregiver).  Consent for procedure obtained.  Time Out: Verified patient identification, verified procedure, site/side was marked, verified correct patient position, special equipment/implants available, medications/allergies/relevent history reviewed, required imaging and test results available.  Performed  Patient placed on cardiac monitor, pulse oximetry, supplemental oxygen as necessary.  Sedation given: propofol  IV, Anesthesiology Pacer pads placed anterior and posterior chest.  Cardioverted 1 time(s).  Cardioversion with synchronized biphasic 200J shock.  Evaluation: Findings: Post procedure EKG shows: NSR w PACs Complications: None Patient did tolerate procedure well.  Time Spent Directly with the Patient:  30 minutes   Michelle Dean 02/22/2024, 3:11 PM

## 2024-02-22 NOTE — Anesthesia Preprocedure Evaluation (Addendum)
 Anesthesia Evaluation  Patient identified by MRN, date of birth, ID band Patient awake    Reviewed: Allergy & Precautions, H&P , NPO status , Patient's Chart, lab work & pertinent test results  Airway Mallampati: II  TM Distance: >3 FB Neck ROM: Full    Dental no notable dental hx. (+) Teeth Intact, Dental Advisory Given   Pulmonary COPD, former smoker   Pulmonary exam normal breath sounds clear to auscultation       Cardiovascular hypertension, Pt. on medications +CHF  Normal cardiovascular exam+ dysrhythmias Atrial Fibrillation  Rhythm:Irregular Rate:Normal  TTE 2025 1. Left ventricular ejection fraction, by estimation, is 60 to 65%. The  left ventricle has normal function. The left ventricle has no regional  wall motion abnormalities. Left ventricular diastolic function could not  be evaluated.   2. Right ventricular systolic function is normal. The right ventricular  size is normal. There is normal pulmonary artery systolic pressure.   3. Left atrial size was mildly dilated.   4. The mitral valve is normal in structure. Trivial mitral valve  regurgitation.   5. The aortic valve is tricuspid. Aortic valve regurgitation is not  visualized. No aortic stenosis is present.   6. The inferior vena cava is dilated in size with <50% respiratory  variability, suggesting right atrial pressure of 15 mmHg.     Neuro/Psych CVA  negative psych ROS   GI/Hepatic negative GI ROS, Neg liver ROS,,,  Endo/Other  negative endocrine ROS    Renal/GU Renal InsufficiencyRenal disease  negative genitourinary   Musculoskeletal   Abdominal   Peds  Hematology  (+) Blood dyscrasia (eliquis )   Anesthesia Other Findings   Reproductive/Obstetrics negative OB ROS                              Anesthesia Physical Anesthesia Plan  ASA: 3  Anesthesia Plan: General   Post-op Pain Management: Minimal or no pain  anticipated   Induction: Intravenous  PONV Risk Score and Plan: 3 and Propofol  infusion and Treatment may vary due to age or medical condition  Airway Management Planned: Natural Airway and Nasal Cannula  Additional Equipment:   Intra-op Plan:   Post-operative Plan:   Informed Consent: I have reviewed the patients History and Physical, chart, labs and discussed the procedure including the risks, benefits and alternatives for the proposed anesthesia with the patient or authorized representative who has indicated his/her understanding and acceptance.     Dental advisory given  Plan Discussed with: CRNA  Anesthesia Plan Comments:          Anesthesia Quick Evaluation

## 2024-02-22 NOTE — H&P (View-Only) (Signed)
 Rounding Note   Patient Name: Michelle Dean Date of Encounter: 02/22/2024  [facility] Cardiologist: Madonna Large, DO   Subjective Pt initially mildly confused this morning, responded to re-orientation. She confirms she takes eliquis  twice daily.   She is aware of RVR, rate controlled currently.   Scheduled Meds:  apixaban   5 mg Oral BID   atorvastatin   10 mg Oral Daily   diltiazem   240 mg Oral Daily   flecainide  50 mg Oral Q12H   metoprolol  tartrate  25 mg Oral BID   pantoprazole   40 mg Oral Daily   sodium chloride  flush  3 mL Intravenous Q12H   umeclidinium bromide   1 puff Inhalation Daily   Continuous Infusions:  PRN Meds: acetaminophen  **OR** acetaminophen , prochlorperazine, senna-docusate   Vital Signs  Vitals:   02/22/24 0500 02/22/24 0747 02/22/24 0750 02/22/24 0807  BP: 107/69   120/83  Pulse:  98  76  Resp: 19 20  20   Temp: 97.7 F (36.5 C)   98 F (36.7 C)  TempSrc: Oral   Oral  SpO2: 97% 94% 94% 94%  Weight:      Height:        Intake/Output Summary (Last 24 hours) at 02/22/2024 0837 Last data filed at 02/21/2024 2135 Gross per 24 hour  Intake 540 ml  Output 200 ml  Net 340 ml      02/17/2024   11:07 AM 02/16/2024   10:06 PM 06/12/2023    2:25 PM  Last 3 Weights  Weight (lbs) 186 lb 1.1 oz 184 lb 1.4 oz 184 lb  Weight (kg) 84.4 kg 83.5 kg 83.462 kg      Telemetry Afib with rates in the 70-80s - Personally Reviewed  ECG  No new tracings  - Personally Reviewed  Physical Exam  GEN: No acute distress.   Neck: No JVD Cardiac: irregular rhythm, regular rate Respiratory: Clear to auscultation bilaterally in upper lobes, diminished in bases GI: Soft, nontender, non-distended  MS: minimal B LE edema L >  R Neuro:  Nonfocal  Psych: Normal affect   Labs High Sensitivity Troponin:  No results for input(s): TROPONINIHS in the last 720 hours.   Chemistry Recent Labs  Lab 02/15/24 2330 02/16/24 0500 02/17/24 0401  02/18/24 0233 02/19/24 0248  NA 143 140 140 139 140  K 3.5 4.9 4.0 4.5 4.0  CL 109 113* 107 108 107  CO2 23 20* 24 20* 23  GLUCOSE 109* 100* 90 90 89  BUN 17 13 17 21  25*  CREATININE 1.35* 1.19* 1.30* 1.29* 1.30*  CALCIUM  8.0* 8.2* 8.7* 8.6* 8.5*  MG 1.9 2.2  --   --   --   PROT 5.7*  --   --   --   --   ALBUMIN 3.2*  --   --   --   --   AST 20  --   --   --   --   ALT 11  --   --   --   --   ALKPHOS 56  --   --   --   --   BILITOT 0.3  --   --   --   --   GFRNONAA 40* 47* 42* 42* 42*  ANIONGAP 11 7 9 11 10     Lipids No results for input(s): CHOL, TRIG, HDL, LABVLDL, LDLCALC, CHOLHDL in the last 168 hours.  Hematology Recent Labs  Lab 02/17/24 0401 02/18/24 0233 02/19/24 0248  WBC 9.1 10.0  9.7  RBC 4.26 4.34 4.53  HGB 12.6 12.7 13.2  HCT 39.7 40.2 41.7  MCV 93.2 92.6 92.1  MCH 29.6 29.3 29.1  MCHC 31.7 31.6 31.7  RDW 14.6 14.6 14.4  PLT 235 227 255   Thyroid No results for input(s): TSH, FREET4 in the last 168 hours.  BNP Recent Labs  Lab 02/15/24 2330  BNP 253.9*    DDimer No results for input(s): DDIMER in the last 168 hours.   Radiology  No results found.  Cardiac Studies  Echo 02/16/24:  1. Left ventricular ejection fraction, by estimation, is 60 to 65%. The  left ventricle has normal function. The left ventricle has no regional  wall motion abnormalities. Left ventricular diastolic function could not  be evaluated.   2. Right ventricular systolic function is normal. The right ventricular  size is normal. There is normal pulmonary artery systolic pressure.   3. Left atrial size was mildly dilated.   4. The mitral valve is normal in structure. Trivial mitral valve  regurgitation.   5. The aortic valve is tricuspid. Aortic valve regurgitation is not  visualized. No aortic stenosis is present.   6. The inferior vena cava is dilated in size with <50% respiratory  variability, suggesting right atrial pressure of 15 mmHg.   Patient  Profile   78 y.o. female with hypertension, hyperlipidemia, PAF, h/o stroke, HFpEF, CKD stage IIIa, admitted with atrial flutter with RVR.  Telemetry with Afib in the 70-80s  Assessment & Plan   Persistent Afib Afib with RVR - she has done well since TEE-DCCV 2022 - recent heart monitor 08/2023 with no Afib - presented in Afib RVR --> cardizem  increased to 240 mg daily (from 120 mg at home) - started on lopressor  25 mg BID - EP added flecainide 50 mg BID on 02/20/24   Hypertension - holding home losartan  - BP normotensive this morning   Chronic anticoagulation - she confirms she takes eliquis  BID and has not missed doses   HFpEF - does not appear extremely volume up - would benefit from restoration of sinus rhyhtm   Will plan for DCCV today. Her son is on his way.       For questions or updates, please contact [facility] Please consult [email or web address] for contact info under       Signed, Jon Nat Hails, PA  02/22/2024, 8:37 AM

## 2024-02-22 NOTE — Plan of Care (Signed)
 ?  Problem: Clinical Measurements: ?Goal: Will remain free from infection ?Outcome: Progressing ?  ?

## 2024-02-22 NOTE — Research (Signed)
 [facility] Cardioversion Informed Consent   Subject Name: Michelle Dean  Subject met inclusion and exclusion criteria.  The informed consent form, study requirements and expectations were reviewed with the subject and questions and concerns were addressed prior to the signing of the consent form. The subject verbalized understanding of the trial requirements. The subject and subject's legal authorized representative agreed to participate in the [facility] Cardioversion trial and legal authorized representative signed the informed consent in lieu of subject at 1425 on 13/Oct/2025. The informed consent was obtained prior to performance of any protocol-specific procedures for the subject.  A copy of the signed informed consent was given to the subject and a copy was placed in the subject's medical record.   [name]

## 2024-02-22 NOTE — Interval H&P Note (Signed)
 History and Physical Interval Note:  02/22/2024 10:50 AM  [name]  has presented today for surgery, with the diagnosis of afib.  The various methods of treatment have been discussed with the patient and family. After consideration of risks, benefits and other options for treatment, the patient has consented to  Procedure(s): CARDIOVERSION (N/A) as a surgical intervention.  The patient's history has been reviewed, patient examined, no change in status, stable for surgery.  I have reviewed the patient's chart and labs.  Questions were answered to the patient's satisfaction.     Stella Bortle

## 2024-02-22 NOTE — Discharge Summary (Signed)
 Physician Discharge Summary   Patient: Michelle Dean MRN: [id number] DOB: 20-Aug-1945  Admit date:     02/15/2024  Discharge date: 02/22/24  Discharge Physician: [name]   PCP: Bonney Lynwood BRAVO, MD   Recommendations at discharge:   Follow-up cardiology as outpatient  Discharge Diagnoses: Principal Problem:   Atrial flutter with rapid ventricular response (HCC) Active Problems:   History of ischemic left MCA stroke   CKD stage 3a, GFR 45-59 ml/min (HCC)   Chronic diastolic CHF (congestive heart failure) (HCC)   COPD (chronic obstructive pulmonary disease) (HCC)  Resolved Problems:   * No resolved hospital problems. *  Hospital Course: 77 y.o. female with medical history significant for hypertension, hyperlipidemia, CKD 3A, history of CVA, PAF on Eliquis , and chronic HFpEF who presents with palpitations. Patient reports that she was experiencing palpitations on 02/14/2024 but notes that it became more intense on 02/15/2024 prompting her to come to the ER.  No other symptoms, patient reports compliance to medications.  She was found to be in rapid A-fib ordered in the ER.  BNP 254.  Vital signs stable. She was initiated on IV Cardizem  drip.    Assessment and Plan:  1.  Paroxysmal A-fib with RVR -Presented with rapid A-fib with heart rate in 140s -Started on IV Cardizem  and p.o. metoprolol  -Switch to p.o. metoprolol  and Cardizem  however rate continued to be intermittently elevated to 150s - Echocardiogram obtained yesterday showed EF of 60 to 65%, no regional wall motion abnormality.  Left atrial size was mildly dilated.  Normal pulmonary artery systolic pressure  -Cardiology consulted, despite treatment with Cardizem  and metoprolol , patient's heart rate intermittently jumps to 150s - Cardiology was consulted, underwent cardioversion today.  Converted back to normal sinus rhythm after 1 shock. -Cardiology recommends to discharge home on Cardizem  CD 120 mg daily, Toprol -XL 12.5 mg  p.o. nightly, flecainide 50 mg p.o. twice daily -Continue Eliquis    2. Chronic HFpEF  - Appears compensated       3. COPD  - Not in exacerbation  - Continue ICS-LABA and Brayah Urquilla     4. Hx of stroke  - Continue Lipitor and Eliquis      5. CKD 3A  - SCr is 1.35 on admission; baseline may be closer to 1.1 or 1.2  - Renally-dose medications   -Creatinine stable at 1.30            Consultants: Cardiology Procedures performed: Cardioversion Disposition: Home Diet recommendation:  Discharge Diet Orders (From admission, onward)     Start     Ordered   02/22/24 0000  Diet - low sodium heart healthy        02/22/24 1722           Regular diet DISCHARGE MEDICATION: Allergies as of 02/22/2024       Reactions   Aspirin     Other reaction(s): Bleeding   Penicillins Hives, Rash        Medication List     STOP taking these medications    losartan  50 MG tablet Commonly known as: COZAAR        TAKE these medications    acetaminophen  500 MG tablet Commonly known as: TYLENOL  Take 500 mg by mouth every 6 (six) hours as needed for moderate pain (pain score 4-6).   albuterol  (2.5 MG/3ML) 0.083% nebulizer solution Commonly known as: PROVENTIL  Take 2.5 mg by nebulization every 4 (four) hours as needed for wheezing.   atorvastatin  10 MG tablet Commonly known as: LIPITOR Take 1  tablet (10 mg total) by mouth daily.   carboxymethylcellulose 0.5 % Soln Commonly known as: REFRESH PLUS Place 1 drop into both eyes 4 (four) times daily as needed (dry eyes).   CertaVite/Antioxidants Tabs Take 1 tablet by mouth daily.   cholecalciferol 25 MCG (1000 UNIT) tablet Commonly known as: VITAMIN D3 Take 2,000 Units by mouth daily.   DAILY FIBER PO Take 160 mg by mouth daily.   diltiazem  120 MG 24 hr capsule Commonly known as: CARDIZEM  CD Take 1 capsule (120 mg total) by mouth every evening.   Dulera  100-5 MCG/ACT Aero Generic drug: mometasone -formoterol  Inhale 2 puffs  into the lungs 2 (two) times daily.   Eliquis  5 MG Tabs tablet Generic drug: apixaban  TAKE ONE TABLET TWICE A DAY.   empagliflozin  10 MG Tabs tablet Commonly known as: Jardiance  Take 1 tablet (10 mg total) by mouth daily.   flecainide 50 MG tablet Commonly known as: TAMBOCOR Take 1 tablet (50 mg total) by mouth every 12 (twelve) hours.   Incruse Ellipta  62.5 MCG/ACT Aepb Generic drug: umeclidinium bromide  Inhale 1 puff into the lungs daily.   metoprolol  succinate 25 MG 24 hr tablet Commonly known as: Toprol  XL Take 0.5 tablets (12.5 mg total) by mouth at bedtime.   omeprazole  20 MG capsule Commonly known as: PRILOSEC Take 1 capsule (20 mg total) by mouth daily.   senna-docusate 8.6-50 MG tablet Commonly known as: Senokot-S Take 2 tablets by mouth 2 (two) times daily.        Follow-up Information     Bonney Lynwood BRAVO, MD Follow up.   Specialty: Internal Medicine Contact information: [address] [place] [address] [phone]                Discharge Exam: Michelle Dean   02/16/24 2206 02/17/24 1107  Weight: 83.5 kg 84.4 kg   General-appears in no acute distress Heart-S1-S2, regular, no murmur auscultated Lungs-clear to auscultation bilaterally, no wheezing or crackles auscultated Abdomen-soft, nontender, no organomegaly Extremities-no edema in the lower extremities Neuro-alert, oriented x3, no focal deficit noted  Condition at discharge: good  The results of significant diagnostics from this hospitalization (including imaging, microbiology, ancillary and laboratory) are listed below for reference.   Imaging Studies: EP STUDY Result Date: 02/22/2024 See surgical note for result.  ECHOCARDIOGRAM COMPLETE Result Date: 02/16/2024    ECHOCARDIOGRAM REPORT   Patient Name:   Michelle Dean Date of Exam: 02/16/2024 Medical Rec #:  [id number]     Height:       64.0 in Accession #:    [id number]    Weight:       184.0 lb Date of Birth:  Mar 25, 1946    BSA:           1.888 m Patient Age:    6 years      BP:           122/71 mmHg Patient Gender: F             HR:           77 bpm. Exam Location:  Inpatient Procedure: 2D Echo, Color Doppler and Cardiac Doppler (Both Spectral and Color            Flow Doppler were utilized during procedure). Indications:     Atrial fluter I48.92  History:         Patient has prior history of Echocardiogram examinations, most  recent 02/26/2021. Stroke, Arrythmias:Atrial Flutter, Atrial                  Fibrillation and Tachycardia, Signs/Symptoms:Shortness of                  Breath; Risk Factors:Hypertension.  Sonographer:     BERNARDA ROCKS Referring Phys:  [id number] SANTOSH SIGDEL Diagnosing Phys: Joelle Cedars Tonleu IMPRESSIONS  1. Left ventricular ejection fraction, by estimation, is 60 to 65%. The left ventricle has normal function. The left ventricle has no regional wall motion abnormalities. Left ventricular diastolic function could not be evaluated.  2. Right ventricular systolic function is normal. The right ventricular size is normal. There is normal pulmonary artery systolic pressure.  3. Left atrial size was mildly dilated.  4. The mitral valve is normal in structure. Trivial mitral valve regurgitation.  5. The aortic valve is tricuspid. Aortic valve regurgitation is not visualized. No aortic stenosis is present.  6. The inferior vena cava is dilated in size with <50% respiratory variability, suggesting right atrial pressure of 15 mmHg. FINDINGS  Left Ventricle: Left ventricular ejection fraction, by estimation, is 60 to 65%. The left ventricle has normal function. The left ventricle has no regional wall motion abnormalities. The left ventricular internal cavity size was normal in size. There is  no left ventricular hypertrophy. Left ventricular diastolic function could not be evaluated due to atrial fibrillation. Left ventricular diastolic function could not be evaluated. Right Ventricle: The right ventricular size  is normal. Right vetricular wall thickness was not well visualized. Right ventricular systolic function is normal. There is normal pulmonary artery systolic pressure. The tricuspid regurgitant velocity is 2.32 m/s, and with an assumed right atrial pressure of 3 mmHg, the estimated right ventricular systolic pressure is 24.5 mmHg. Left Atrium: Left atrial size was mildly dilated. Right Atrium: Right atrial size was normal in size. Pericardium: There is no evidence of pericardial effusion. Mitral Valve: The mitral valve is normal in structure. Trivial mitral valve regurgitation. MV peak gradient, 4.8 mmHg. The mean mitral valve gradient is 2.0 mmHg. Tricuspid Valve: The tricuspid valve is normal in structure. Tricuspid valve regurgitation is trivial. Aortic Valve: The aortic valve is tricuspid. Aortic valve regurgitation is not visualized. No aortic stenosis is present. Aortic valve mean gradient measures 3.0 mmHg. Aortic valve peak gradient measures 6.1 mmHg. Aortic valve area, by VTI measures 3.56 cm. Pulmonic Valve: The pulmonic valve was normal in structure. Pulmonic valve regurgitation is not visualized. Aorta: The aortic root and ascending aorta are structurally normal, with no evidence of dilitation. Venous: The inferior vena cava is dilated in size with less than 50% respiratory variability, suggesting right atrial pressure of 15 mmHg. IAS/Shunts: No atrial level shunt detected by color flow Doppler.  LEFT VENTRICLE PLAX 2D LVIDd:         4.30 cm LVIDs:         2.80 cm LV PW:         1.00 cm LV IVS:        1.00 cm LVOT diam:     2.20 cm LV SV:         72 LV SV Index:   38 LVOT Area:     3.80 cm  LV Volumes (MOD) LV vol d, MOD A2C: 129.0 ml LV vol d, MOD A4C: 86.6 ml LV vol s, MOD A2C: 41.0 ml LV vol s, MOD A4C: 29.0 ml LV SV MOD A2C:     88.0 ml LV SV  MOD A4C:     86.6 ml LV SV MOD BP:      73.6 ml RIGHT VENTRICLE             IVC RV Basal diam:  3.80 cm     IVC diam: 2.40 cm RV S prime:     10.00 cm/s TAPSE  (M-mode): 2.1 cm RVSP:           24.5 mmHg LEFT ATRIUM             Index        RIGHT ATRIUM           Index LA diam:        4.70 cm 2.49 cm/m   RA Pressure: 3.00 mmHg LA Vol (A2C):   85.5 ml 45.28 ml/m  RA Area:     13.10 cm LA Vol (A4C):   50.2 ml 26.59 ml/m  RA Volume:   30.00 ml  15.89 ml/m LA Biplane Vol: 65.3 ml 34.58 ml/m  AORTIC VALVE                    PULMONIC VALVE AV Area (Vmax):    2.99 cm     PV Vmax:          1.08 m/s AV Area (Vmean):   3.00 cm     PV Peak grad:     4.7 mmHg AV Area (VTI):     3.56 cm     PR End Diast Vel: 4.24 msec AV Vmax:           123.00 cm/s AV Vmean:          77.700 cm/s AV VTI:            0.202 m AV Peak Grad:      6.1 mmHg AV Mean Grad:      3.0 mmHg LVOT Vmax:         96.90 cm/s LVOT Vmean:        61.300 cm/s LVOT VTI:          0.189 m LVOT/AV VTI ratio: 0.94  AORTA Ao Root diam: 3.30 cm Ao Asc diam:  3.50 cm MITRAL VALVE               TRICUSPID VALVE MV Area (PHT): 3.93 cm    TR Peak grad:   21.5 mmHg MV Area VTI:   3.28 cm    TR Vmax:        232.00 cm/s MV Peak grad:  4.8 mmHg    Estimated RAP:  3.00 mmHg MV Mean grad:  2.0 mmHg    RVSP:           24.5 mmHg MV Vmax:       1.09 m/s MV Vmean:      65.7 cm/s   SHUNTS MV Decel Time: 193 msec    Systemic VTI:  0.19 m MV E velocity: 99.40 cm/s  Systemic Diam: 2.20 cm MV A velocity: 50.10 cm/s MV E/A ratio:  1.98 Franck Azobou Tonleu Electronically signed by Joelle Cedars Tonleu Signature Date/Time: 02/16/2024/11:13:26 AM    Final (Updated)    DG Chest Portable 1 View Result Date: 02/15/2024 CLINICAL DATA:  Atrial fibrillation.  Palpitations. EXAM: PORTABLE CHEST 1 VIEW COMPARISON:  06/15/2021 FINDINGS: Shallow inspiration. Linear atelectasis or scarring in the lung bases. Heart size and pulmonary vascularity are normal for technique. No airspace disease or consolidation in the lungs. No pleural effusion or pneumothorax. Calcification of  the aorta. IMPRESSION: Shallow inspiration with scarring or atelectasis in the  lung bases. Electronically Signed   By: Elsie Gravely M.D.   On: 02/15/2024 23:46    Microbiology: Results for orders placed or performed during the hospital encounter of 06/15/21  Resp Panel by RT-PCR (Flu A&B, Covid) Urine, Clean Catch     Status: None   Collection Time: 06/15/21  8:28 PM   Specimen: Urine, Clean Catch; Nasopharyngeal(NP) swabs in vial transport medium  Result Value Ref Range Status   SARS Coronavirus 2 by RT PCR NEGATIVE NEGATIVE Final    Comment: (NOTE) SARS-CoV-2 target nucleic acids are NOT DETECTED.  The SARS-CoV-2 RNA is generally detectable in upper respiratory specimens during the acute phase of infection. The lowest concentration of SARS-CoV-2 viral copies this assay can detect is 138 copies/mL. A negative result does not preclude SARS-Cov-2 infection and should not be used as the sole basis for treatment or other patient management decisions. A negative result may occur with  improper specimen collection/handling, submission of specimen other than nasopharyngeal swab, presence of viral mutation(s) within the areas targeted by this assay, and inadequate number of viral copies(<138 copies/mL). A negative result must be combined with clinical observations, patient history, and epidemiological information. The expected result is Negative.  Fact Sheet for Patients:  BloggerCourse.com  Fact Sheet for Healthcare Providers:  SeriousBroker.it  This test is no t yet approved or cleared by the [place]  FDA and  has been authorized for detection and/or diagnosis of SARS-CoV-2 by FDA under an Emergency Use Authorization (EUA). This EUA will remain  in effect (meaning this test can be used) for the duration of the COVID-19 declaration under Section 564(b)(1) of the Act, 21 U.S.C.section 360bbb-3(b)(1), unless the authorization is terminated  or revoked sooner.       Influenza A by PCR NEGATIVE NEGATIVE  Final   Influenza B by PCR NEGATIVE NEGATIVE Final    Comment: (NOTE) The Xpert Xpress SARS-CoV-2/FLU/RSV plus assay is intended as an aid in the diagnosis of influenza from Nasopharyngeal swab specimens and should not be used as a sole basis for treatment. Nasal washings and aspirates are unacceptable for Xpert Xpress SARS-CoV-2/FLU/RSV testing.  Fact Sheet for Patients: BloggerCourse.com  Fact Sheet for Healthcare Providers: SeriousBroker.it  This test is not yet approved or cleared by the [place]  FDA and has been authorized for detection and/or diagnosis of SARS-CoV-2 by FDA under an Emergency Use Authorization (EUA). This EUA will remain in effect (meaning this test can be used) for the duration of the COVID-19 declaration under Section 564(b)(1) of the Act, 21 U.S.C. section 360bbb-3(b)(1), unless the authorization is terminated or revoked.  Performed at [facility], [address], [place], [place] [address]   MRSA Next Gen by PCR, Nasal     Status: None   Collection Time: 06/15/21  9:50 PM   Specimen: Nasal Mucosa; Nasal Swab  Result Value Ref Range Status   MRSA by PCR Next Gen NOT DETECTED NOT DETECTED Final    Comment: (NOTE) The GeneXpert MRSA Assay (FDA approved for NASAL specimens only), is one component of a comprehensive MRSA colonization surveillance program. It is not intended to diagnose MRSA infection nor to guide or monitor treatment for MRSA infections. Test performance is not FDA approved in patients less than 38 years old. Performed at [facility], [address], [place], [place] [address]     Labs: CBC: Recent Labs  Lab 02/15/24 2330 02/16/24 0500  02/17/24 0401 02/18/24 0233 02/19/24 0248  WBC 11.2* 9.9 9.1 10.0 9.7  NEUTROABS 6.9  --   --   --   --   HGB 13.0 12.4 12.6 12.7 13.2  HCT 42.1 39.5 39.7 40.2 41.7  MCV 94.8 95.4 93.2 92.6 92.1  PLT 258 259 235 227 255    Basic Metabolic Panel: Recent Labs  Lab 02/15/24 2330 02/16/24 0500 02/17/24 0401 02/18/24 0233 02/19/24 0248  NA 143 140 140 139 140  K 3.5 4.9 4.0 4.5 4.0  CL 109 113* 107 108 107  CO2 23 20* 24 20* 23  GLUCOSE 109* 100* 90 90 89  BUN 17 13 17 21  25*  CREATININE 1.35* 1.19* 1.30* 1.29* 1.30*  CALCIUM  8.0* 8.2* 8.7* 8.6* 8.5*  MG 1.9 2.2  --   --   --    Liver Function Tests: Recent Labs  Lab 02/15/24 2330  AST 20  ALT 11  ALKPHOS 56  BILITOT 0.3  PROT 5.7*  ALBUMIN 3.2*   CBG: Recent Labs  Lab 02/18/24 0629  GLUCAP 90    Discharge time spent: greater than 30 minutes.  Signed: [name], MD [facility] 02/22/2024

## 2024-02-22 NOTE — Progress Notes (Signed)
 Rounding Note   Patient Name: Michelle Dean Date of Encounter: 02/22/2024  [facility] Cardiologist: Madonna Large, DO   Subjective Pt initially mildly confused this morning, responded to re-orientation. She confirms she takes eliquis  twice daily.   She is aware of RVR, rate controlled currently.   Scheduled Meds:  apixaban   5 mg Oral BID   atorvastatin   10 mg Oral Daily   diltiazem   240 mg Oral Daily   flecainide  50 mg Oral Q12H   metoprolol  tartrate  25 mg Oral BID   pantoprazole   40 mg Oral Daily   sodium chloride  flush  3 mL Intravenous Q12H   umeclidinium bromide   1 puff Inhalation Daily   Continuous Infusions:  PRN Meds: acetaminophen  **OR** acetaminophen , prochlorperazine, senna-docusate   Vital Signs  Vitals:   02/22/24 0500 02/22/24 0747 02/22/24 0750 02/22/24 0807  BP: 107/69   120/83  Pulse:  98  76  Resp: 19 20  20   Temp: 97.7 F (36.5 C)   98 F (36.7 C)  TempSrc: Oral   Oral  SpO2: 97% 94% 94% 94%  Weight:      Height:        Intake/Output Summary (Last 24 hours) at 02/22/2024 0837 Last data filed at 02/21/2024 2135 Gross per 24 hour  Intake 540 ml  Output 200 ml  Net 340 ml      02/17/2024   11:07 AM 02/16/2024   10:06 PM 06/12/2023    2:25 PM  Last 3 Weights  Weight (lbs) 186 lb 1.1 oz 184 lb 1.4 oz 184 lb  Weight (kg) 84.4 kg 83.5 kg 83.462 kg      Telemetry Afib with rates in the 70-80s - Personally Reviewed  ECG  No new tracings  - Personally Reviewed  Physical Exam  GEN: No acute distress.   Neck: No JVD Cardiac: irregular rhythm, regular rate Respiratory: Clear to auscultation bilaterally in upper lobes, diminished in bases GI: Soft, nontender, non-distended  MS: minimal B LE edema L >  R Neuro:  Nonfocal  Psych: Normal affect   Labs High Sensitivity Troponin:  No results for input(s): TROPONINIHS in the last 720 hours.   Chemistry Recent Labs  Lab 02/15/24 2330 02/16/24 0500 02/17/24 0401  02/18/24 0233 02/19/24 0248  NA 143 140 140 139 140  K 3.5 4.9 4.0 4.5 4.0  CL 109 113* 107 108 107  CO2 23 20* 24 20* 23  GLUCOSE 109* 100* 90 90 89  BUN 17 13 17 21  25*  CREATININE 1.35* 1.19* 1.30* 1.29* 1.30*  CALCIUM  8.0* 8.2* 8.7* 8.6* 8.5*  MG 1.9 2.2  --   --   --   PROT 5.7*  --   --   --   --   ALBUMIN 3.2*  --   --   --   --   AST 20  --   --   --   --   ALT 11  --   --   --   --   ALKPHOS 56  --   --   --   --   BILITOT 0.3  --   --   --   --   GFRNONAA 40* 47* 42* 42* 42*  ANIONGAP 11 7 9 11 10     Lipids No results for input(s): CHOL, TRIG, HDL, LABVLDL, LDLCALC, CHOLHDL in the last 168 hours.  Hematology Recent Labs  Lab 02/17/24 0401 02/18/24 0233 02/19/24 0248  WBC 9.1 10.0  9.7  RBC 4.26 4.34 4.53  HGB 12.6 12.7 13.2  HCT 39.7 40.2 41.7  MCV 93.2 92.6 92.1  MCH 29.6 29.3 29.1  MCHC 31.7 31.6 31.7  RDW 14.6 14.6 14.4  PLT 235 227 255   Thyroid No results for input(s): TSH, FREET4 in the last 168 hours.  BNP Recent Labs  Lab 02/15/24 2330  BNP 253.9*    DDimer No results for input(s): DDIMER in the last 168 hours.   Radiology  No results found.  Cardiac Studies  Echo 02/16/24:  1. Left ventricular ejection fraction, by estimation, is 60 to 65%. The  left ventricle has normal function. The left ventricle has no regional  wall motion abnormalities. Left ventricular diastolic function could not  be evaluated.   2. Right ventricular systolic function is normal. The right ventricular  size is normal. There is normal pulmonary artery systolic pressure.   3. Left atrial size was mildly dilated.   4. The mitral valve is normal in structure. Trivial mitral valve  regurgitation.   5. The aortic valve is tricuspid. Aortic valve regurgitation is not  visualized. No aortic stenosis is present.   6. The inferior vena cava is dilated in size with <50% respiratory  variability, suggesting right atrial pressure of 15 mmHg.   Patient  Profile   78 y.o. female with hypertension, hyperlipidemia, PAF, h/o stroke, HFpEF, CKD stage IIIa, admitted with atrial flutter with RVR.  Telemetry with Afib in the 70-80s  Assessment & Plan   Persistent Afib Afib with RVR - she has done well since TEE-DCCV 2022 - recent heart monitor 08/2023 with no Afib - presented in Afib RVR --> cardizem  increased to 240 mg daily (from 120 mg at home) - started on lopressor  25 mg BID - EP added flecainide 50 mg BID on 02/20/24   Hypertension - holding home losartan  - BP normotensive this morning   Chronic anticoagulation - she confirms she takes eliquis  BID and has not missed doses   HFpEF - does not appear extremely volume up - would benefit from restoration of sinus rhyhtm   Will plan for DCCV today. Her son is on his way.       For questions or updates, please contact [facility] Please consult [email or web address] for contact info under       Signed, Jon Nat Hails, PA  02/22/2024, 8:37 AM

## 2024-02-22 NOTE — Transfer of Care (Signed)
 Immediate Anesthesia Transfer of Care Note  Patient: Michelle Dean  Procedure(s) Performed: CARDIOVERSION  Patient Location: Cath Lab  Anesthesia Type:General  Level of Consciousness: drowsy  Airway & Oxygen Therapy: Patient Spontanous Breathing and Patient connected to nasal cannula oxygen  Post-op Assessment: Report given to RN and Post -op Vital signs reviewed and stable  Post vital signs: Reviewed and stable  Last Vitals:  Vitals Value Taken Time  BP    Temp    Pulse 98 02/22/24 15:08  Resp 28 02/22/24 15:08  SpO2 94 % 02/22/24 15:08  Vitals shown include unfiled device data.  Last Pain:  Vitals:   02/22/24 1443  TempSrc:   PainSc: 0-No pain      Patients Stated Pain Goal: 0 (02/20/24 1024)  Complications: There were no known notable events for this encounter.

## 2024-02-22 NOTE — Progress Notes (Signed)
 Patients nephew called to receive information on patient.   Patient stated that she gave her nephew an update and that if he wants information he can get it from her.  This nurse asked if she wanted me to call the nephew and relay that information and she said Yes.  Dorsey informed of patients wishes

## 2024-02-22 NOTE — Progress Notes (Signed)
 Attempt to speak with patient's son to update about patient's procedure today, left VM

## 2024-02-23 ENCOUNTER — Encounter (HOSPITAL_COMMUNITY): Payer: Self-pay | Admitting: Cardiovascular Disease

## 2024-02-25 NOTE — Anesthesia Postprocedure Evaluation (Signed)
 Anesthesia Post Note  Patient: Michelle Dean  Procedure(s) Performed: CARDIOVERSION     Patient location during evaluation: Cath Lab Anesthesia Type: General Level of consciousness: awake and alert Pain management: pain level controlled Vital Signs Assessment: post-procedure vital signs reviewed and stable Respiratory status: spontaneous breathing, nonlabored ventilation, respiratory function stable and patient connected to nasal cannula oxygen Cardiovascular status: blood pressure returned to baseline and stable Postop Assessment: no apparent nausea or vomiting Anesthetic complications: no   There were no known notable events for this encounter.  Last Vitals:  Vitals:   02/22/24 1543 02/22/24 1600  BP: 111/71 106/88  Pulse: 61 67  Resp: (!) 21 20  Temp:  37 C  SpO2: 94% 96%    Last Pain:  Vitals:   02/22/24 1600  TempSrc: Oral  PainSc: 0-No pain                 Naziya Hegwood L Bekim Werntz

## 2024-03-07 NOTE — Progress Notes (Unsigned)
 Cardiology Office Note:    Date:  03/09/2024   ID:  Michelle Dean, DOB [birth year], MRN [id number]  PCP:  Bonney Lynwood BRAVO, MD   [facility] Providers Cardiologist:  Madonna Large, DO     Referring MD: Bonney Lynwood BRAVO, MD   Chief Complaint  Patient presents with   Follow-up    Post TEE-DCCV    History of Present Illness:    Michelle Dean is a 78 y.o. female with a hx of recurrent/persistent atrial fibrillation, chronic HFpEF, left MCA stroke, hyperlipidemia, COPD, pulmonary nodules, hypertension, aortic atherosclerosis.  She suffered a stroke October 2022 treated with TNK.  Postprocedure she was diagnosed with A-fib with RVR and cardiology was consulted.  She has a history of DCCV x 2.  She was previously on amiodarone  but this was discontinued and rate control strategy has been pursued.  She is chronically anticoagulated.  GDMT for HFpEF has been difficult in the setting of lightheadedness and dizziness in addition to cost of medications.  She had done well since TEE-DCCV in 2022 with a recent heart monitor that showed no atrial fibrillation in April 2025.  Unfortunately she was hospitalized October 2025 with palpitations found to be in A-fib with RVR.  Her Cardizem  was initially increased to 240 mg and she was started on metoprolol  and flecainide 50 mg twice daily. Echocardiogram 02/16/2024 showed an LVEF 6565% no RWMA, normal RV size and function, mild LAE, trivial MR. She had not missed any doses of Eliquis  and proceeded to DCCV on 02/22/2024.  She was discharged on 120 mg Cardizem , 12.5 mg Toprol  at night, and flecainide 50 mg twice daily.  She is continued on her home Eliquis .  She presents today for routine cardiology follow-up. She presents with her sister. She remains in SR. Upon further reflection, she had significantly increased caffeine intake starting in the summer. She is now caffeine free. She is pleasantly confused, sister helps with history. She is compliant on  medications, no bleeding problems.    Past Medical History:  Diagnosis Date   (HFpEF) heart failure with preserved ejection fraction (HCC)    Hyperlipidemia    Hypertension    Paroxysmal atrial fibrillation (HCC)    RBBB    Stroke [facility])     Past Surgical History:  Procedure Laterality Date   BUBBLE STUDY  03/01/2021   Procedure: BUBBLE STUDY;  Surgeon: Large Madonna, DO;  Location: [facility] ENDOSCOPY;  Service: Cardiovascular;;   CARDIOVERSION N/A 03/01/2021   Procedure: CARDIOVERSION;  Surgeon: Large Madonna, DO;  Location: [facility] ENDOSCOPY;  Service: Cardiovascular;  Laterality: N/A;   CARDIOVERSION N/A 03/05/2021   Procedure: CARDIOVERSION;  Surgeon: Ladona Heinz, MD;  Location: [facility] ENDOSCOPY;  Service: Cardiovascular;  Laterality: N/A;   CARDIOVERSION N/A 02/22/2024   Procedure: CARDIOVERSION;  Surgeon: Francyne Headland, MD;  Location: [facility] INVASIVE CV LAB;  Service: Cardiovascular;  Laterality: N/A;   IR CT HEAD LTD  02/25/2021   IR PERCUTANEOUS ART THROMBECTOMY/INFUSION INTRACRANIAL INC DIAG ANGIO  02/25/2021   RADIOLOGY WITH ANESTHESIA N/A 02/25/2021   Procedure: IR WITH ANESTHESIA;  Surgeon: Radiologist, Medication, MD;  Location: [facility] OR;  Service: Radiology;  Laterality: N/A;   TEE WITHOUT CARDIOVERSION N/A 03/01/2021   Procedure: TRANSESOPHAGEAL ECHOCARDIOGRAM (TEE);  Surgeon: Large Madonna, DO;  Location: [facility] ENDOSCOPY;  Service: Cardiovascular;  Laterality: N/A;    Current Medications: Current Meds  Medication Sig   acetaminophen  (TYLENOL ) 500 MG tablet Take 500 mg by mouth every 6 (six) hours as needed for moderate pain (  pain score 4-6).   albuterol  (PROVENTIL ) (2.5 MG/3ML) 0.083% nebulizer solution Take 2.5 mg by nebulization every 4 (four) hours as needed for wheezing.   atorvastatin  (LIPITOR) 10 MG tablet Take 1 tablet (10 mg total) by mouth daily.   carboxymethylcellulose (REFRESH PLUS) 0.5 % SOLN Place 1 drop into both eyes 4 (four) times daily as needed (dry eyes).    cholecalciferol (VITAMIN D) 25 MCG (1000 UNIT) tablet Take 2,000 Units by mouth daily.   diltiazem  (CARDIZEM  CD) 120 MG 24 hr capsule Take 1 capsule (120 mg total) by mouth every evening.   ELIQUIS  5 MG TABS tablet TAKE ONE TABLET TWICE A DAY.   empagliflozin  (JARDIANCE ) 10 MG TABS tablet Take 1 tablet (10 mg total) by mouth daily.   flecainide (TAMBOCOR) 50 MG tablet Take 1 tablet (50 mg total) by mouth every 12 (twelve) hours.   metoprolol  succinate (TOPROL  XL) 25 MG 24 hr tablet Take 0.5 tablets (12.5 mg total) by mouth at bedtime.   mometasone -formoterol  (DULERA ) 100-5 MCG/ACT AERO Inhale 2 puffs into the lungs 2 (two) times daily.   Multiple Vitamins-Minerals (CERTAVITE/ANTIOXIDANTS) TABS Take 1 tablet by mouth daily.   omeprazole  (PRILOSEC) 20 MG capsule Take 1 capsule (20 mg total) by mouth daily.   Psyllium (DAILY FIBER PO) Take 160 mg by mouth daily.   senna-docusate (SENOKOT-S) 8.6-50 MG tablet Take 2 tablets by mouth 2 (two) times daily.   umeclidinium bromide  (INCRUSE ELLIPTA ) 62.5 MCG/ACT AEPB Inhale 1 puff into the lungs daily.     Allergies:   Aspirin  and Penicillins   Social History   Socioeconomic History   Marital status: Widowed    Spouse name: Not on file   Number of children: 1   Years of education: Not on file   Highest education level: Not on file  Occupational History   Not on file  Tobacco Use   Smoking status: Former    Current packs/day: 0.00    Average packs/day: 1 pack/day for 27.0 years (27.0 ttl pk-yrs)    Types: Cigarettes    Start date: 40    Quit date: 2000    Years since quitting: 25.8    Passive exposure: Never   Smokeless tobacco: Never  Vaping Use   Vaping status: Never Used  Substance and Sexual Activity   Alcohol use: Never   Drug use: Never   Sexual activity: Not Currently  Other Topics Concern   Not on file  Social History Narrative   Not on file   Social Drivers of Health   Financial Resource Strain: Low Risk  (11/02/2023)    Received from [facility]   Overall Financial Resource Strain (CARDIA)    Difficulty of Paying Living Expenses: Not hard at all  Food Insecurity: No Food Insecurity (02/16/2024)   Hunger Vital Sign    Worried About Running Out of Food in the Last Year: Never true    Ran Out of Food in the Last Year: Never true  Transportation Needs: No Transportation Needs (02/16/2024)   PRAPARE - Administrator, Civil Service (Medical): No    Lack of Transportation (Non-Medical): No  Physical Activity: Unknown (11/02/2023)   Received from [facility]   Exercise Vital Sign    On average, how many days per week do you engage in moderate to strenuous exercise (like a brisk walk)?: 0 days    Minutes of Exercise per Session: Not on file  Stress: Stress Concern Present (11/02/2023)   Received from  [facility]   [employer] of Occupational Health - Occupational Stress Questionnaire    Feeling of Stress : Rather much  Social Connections: Socially Integrated (11/02/2023)   Received from [facility]   Social Network    How would you rate your social network (family, work, friends)?: Good participation with social networks     Family History: The patient's family history includes Cancer (age of onset: 41) in her mother; Colon cancer in her sister; Heart attack in her mother; Heart disease in her mother; Kidney failure in her father.  ROS:   Please see the history of present illness.     All other systems reviewed and are negative.  EKGs/Labs/Other Studies Reviewed:    The following studies were reviewed today:  EKG Interpretation Date/Time:  Wednesday March 09 2024 15:19:08 EDT Ventricular Rate:  64 PR Interval:  170 QRS Duration:  134 QT Interval:  440 QTC Calculation: 453 R Axis:   -44  Text Interpretation: Sinus rhythm with Premature atrial complexes Left axis deviation Right bundle branch block When compared with ECG of 22-Feb-2024 15:26, No significant change was  found Confirmed by Madie Slough ([id number]) on 03/09/2024 3:26:44 PM    Recent Labs: 02/15/2024: ALT 11; B Natriuretic Peptide 253.9 02/16/2024: Magnesium  2.2 02/19/2024: BUN 25; Creatinine, Ser 1.30; Hemoglobin 13.2; Platelets 255; Potassium 4.0; Sodium 140  Recent Lipid Panel    Component Value Date/Time   CHOL 131 02/26/2021 0525   TRIG 55 02/26/2021 0525   HDL 57 02/26/2021 0525   CHOLHDL 2.3 02/26/2021 0525   VLDL 11 02/26/2021 0525   LDLCALC 63 02/26/2021 0525     Risk Assessment/Calculations:    CHA2DS2-VASc Score = 8   This indicates a 10.8% annual risk of stroke. The patient's score is based upon: CHF History: 1 HTN History: 1 Diabetes History: 0 Stroke History: 2 Vascular Disease History: 1 Age Score: 2 Gender Score: 1         Physical Exam:    VS:  BP (!) 140/80 (BP Location: Right Arm, Patient Position: Sitting, Cuff Size: Normal)   Pulse 64   Ht 5' 4 (1.626 m)   Wt 180 lb (81.6 kg)   SpO2 96%   BMI 30.90 kg/m     Wt Readings from Last 3 Encounters:  03/09/24 180 lb (81.6 kg)  02/17/24 186 lb 1.1 oz (84.4 kg)  06/12/23 184 lb (83.5 kg)     GEN:  Well nourished, well developed in no acute distress HEENT: Normal NECK: No JVD; No carotid bruits LYMPHATICS: No lymphadenopathy CARDIAC: RRR, no murmurs, rubs, gallops RESPIRATORY:  Clear to auscultation without rales, wheezing or rhonchi  ABDOMEN: Soft, non-tender, non-distended MUSCULOSKELETAL:  No edema; No deformity  SKIN: Warm and dry NEUROLOGIC:  Alert and oriented x 3 PSYCHIATRIC:  Normal affect   ASSESSMENT:    1. Atrial fibrillation with rapid ventricular response (HCC)   2. Persistent atrial fibrillation (HCC)   3. Long term (current) use of anticoagulants   4. Cerebrovascular accident (CVA) due to embolism of cerebral artery (HCC)   5. Chronic diastolic CHF (congestive heart failure) (HCC)   6. Chronic obstructive pulmonary disease, unspecified COPD type (HCC)    PLAN:    In order of  problems listed above:  Recurrent/persistent Afib - underwent DCCV on 02/22/2024 - She was discharged on home 120 mg Cardizem , new 12.5 mg Toprol  at night, and new flecainide 50 mg twice daily -- doing well, in SR with PACs -- no bleeding  issues   Chronic anticoagulation History of stroke - Did not interrupt anticoagulation for at least 4 weeks - No signs of bleeding   Chronic HFpEF - Euvolemic   COPD - intermittently uses home O2   Disposition I suspect underlying dementia. Sister was at her appt today. Pt sister and son help with transportation but she does not have transportation to her upcoming appt with Dr. Inocencio. I have messaged SW to get help and also to Dr. Carolyn team to see if appt can be moved to 11 that day.            Medication Adjustments/Labs and Tests Ordered: Current medicines are reviewed at length with the patient today.  Concerns regarding medicines are outlined above.  Orders Placed This Encounter  Procedures   EKG 12-Lead   No orders of the defined types were placed in this encounter.   Patient Instructions  Medication Instructions:  Your physician recommends that you continue on your current medications as directed. Please refer to the Current Medication list given to you today.  *If you need a refill on your cardiac medications before your next appointment, please call your pharmacy*  Lab Work: NONE If you have labs (blood work) drawn today and your tests are completely normal, you will receive your results only by: MyChart Message (if you have MyChart) OR A paper copy in the mail If you have any lab test that is abnormal or we need to change your treatment, we will call you to review the results.  Testing/Procedures: NONE  Follow-Up: At [facility], you and your health needs are our priority.  As part of our continuing mission to provide you with exceptional heart care, our providers are all part of one team.  This  team includes your primary Cardiologist (physician) and Advanced Practice Providers or APPs (Physician Assistants and Nurse Practitioners) who all work together to provide you with the care you need, when you need it.  Your next appointment:   Keep appointment on 03/22/24 @ 2:15 pm with Dr Inocencio  We recommend signing up for the patient portal called MyChart.  Sign up information is provided on this After Visit Summary.  MyChart is used to connect with patients for Virtual Visits (Telemedicine).  Patients are able to view lab/test results, encounter notes, upcoming appointments, etc.  Non-urgent messages can be sent to your provider as well.   To learn more about what you can do with MyChart, go to [email or web address].   Other Instructions Referral for Social Work Teaching Laboratory Technician)            Signed, Jon Nat Hails, [place]  03/09/2024 3:54 PM    [facility]

## 2024-03-09 ENCOUNTER — Ambulatory Visit: Attending: Physician Assistant | Admitting: Physician Assistant

## 2024-03-09 ENCOUNTER — Encounter: Payer: Self-pay | Admitting: Physician Assistant

## 2024-03-09 VITALS — BP 140/80 | HR 64 | Ht 64.0 in | Wt 180.0 lb

## 2024-03-09 DIAGNOSIS — I4891 Unspecified atrial fibrillation: Secondary | ICD-10-CM

## 2024-03-09 DIAGNOSIS — I6340 Cerebral infarction due to embolism of unspecified cerebral artery: Secondary | ICD-10-CM | POA: Diagnosis not present

## 2024-03-09 DIAGNOSIS — Z7901 Long term (current) use of anticoagulants: Secondary | ICD-10-CM

## 2024-03-09 DIAGNOSIS — Z5982 Transportation insecurity: Secondary | ICD-10-CM

## 2024-03-09 DIAGNOSIS — J449 Chronic obstructive pulmonary disease, unspecified: Secondary | ICD-10-CM

## 2024-03-09 DIAGNOSIS — I4819 Other persistent atrial fibrillation: Secondary | ICD-10-CM | POA: Diagnosis not present

## 2024-03-09 DIAGNOSIS — I5032 Chronic diastolic (congestive) heart failure: Secondary | ICD-10-CM

## 2024-03-09 NOTE — Patient Instructions (Addendum)
 Medication Instructions:  Your physician recommends that you continue on your current medications as directed. Please refer to the Current Medication list given to you today.  *If you need a refill on your cardiac medications before your next appointment, please call your pharmacy*  Lab Work: NONE If you have labs (blood work) drawn today and your tests are completely normal, you will receive your results only by: MyChart Message (if you have MyChart) OR A paper copy in the mail If you have any lab test that is abnormal or we need to change your treatment, we will call you to review the results.  Testing/Procedures: NONE  Follow-Up: At [facility], you and your health needs are our priority.  As part of our continuing mission to provide you with exceptional heart care, our providers are all part of one team.  This team includes your primary Cardiologist (physician) and Advanced Practice Providers or APPs (Physician Assistants and Nurse Practitioners) who all work together to provide you with the care you need, when you need it.  Your next appointment:   Keep appointment on 03/22/24 @ 2:15 pm with Dr Inocencio  3 month follow up with Gen Card  We recommend signing up for the patient portal called MyChart.  Sign up information is provided on this After Visit Summary.  MyChart is used to connect with patients for Virtual Visits (Telemedicine).  Patients are able to view lab/test results, encounter notes, upcoming appointments, etc.  Non-urgent messages can be sent to your provider as well.   To learn more about what you can do with MyChart, go to [email or web address].   Other Instructions Referral for Social Work Teaching Laboratory Technician)

## 2024-03-09 NOTE — Addendum Note (Signed)
 Addended by: Khalen Styer A on: 03/09/2024 04:01 PM   Modules accepted: Orders

## 2024-03-10 NOTE — Addendum Note (Signed)
 Addended by: Johonna Binette A on: 03/10/2024 11:55 AM   Modules accepted: Orders

## 2024-03-22 ENCOUNTER — Encounter: Payer: Self-pay | Admitting: Cardiology

## 2024-03-22 ENCOUNTER — Ambulatory Visit: Attending: Cardiology | Admitting: Cardiology

## 2024-03-22 VITALS — BP 116/80 | HR 56 | Ht 64.0 in | Wt 180.0 lb

## 2024-03-22 DIAGNOSIS — D6869 Other thrombophilia: Secondary | ICD-10-CM

## 2024-03-22 DIAGNOSIS — I4819 Other persistent atrial fibrillation: Secondary | ICD-10-CM | POA: Diagnosis not present

## 2024-03-22 DIAGNOSIS — I4892 Unspecified atrial flutter: Secondary | ICD-10-CM | POA: Diagnosis not present

## 2024-03-22 DIAGNOSIS — R001 Bradycardia, unspecified: Secondary | ICD-10-CM

## 2024-03-22 DIAGNOSIS — Z79899 Other long term (current) drug therapy: Secondary | ICD-10-CM | POA: Diagnosis not present

## 2024-03-22 DIAGNOSIS — I5032 Chronic diastolic (congestive) heart failure: Secondary | ICD-10-CM | POA: Diagnosis not present

## 2024-03-22 NOTE — Progress Notes (Signed)
  Electrophysiology Office Note:   Date:  03/22/2024  ID:  Michelle Dean, DOB 1945/07/02, MRN [id number]  Primary Cardiologist: Madonna Large, DO Primary Heart Failure: None Electrophysiologist: None      History of Present Illness:   Michelle Dean is a 78 y.o. female with h/o atrial fibrillation, hypertension, diastolic heart failure, left MCA stroke, COPD seen today for routine electrophysiology followup.   Since last being seen in our clinic the patient reports doing overall well.  She has no awareness of her arrhythmia.  She is able to do her daily activities without restriction..  she denies chest pain, palpitations, dyspnea, PND, orthopnea, nausea, vomiting, dizziness, syncope, edema, weight gain, or early satiety.   Review of systems complete and found to be negative unless listed in HPI.   EP Information / Studies Reviewed:    EKG is ordered today. Personal review as below.  EKG Interpretation Date/Time:  Tuesday March 22 2024 11:24:33 EST Ventricular Rate:  56 PR Interval:  178 QRS Duration:  138 QT Interval:  444 QTC Calculation: 428 R Axis:   -44  Text Interpretation: Sinus bradycardia with Premature atrial complexes Left axis deviation Right bundle branch block When compared with ECG of 09-Mar-2024 15:19, Nonspecific T wave abnormality has replaced inverted T waves in Inferior leads Confirmed by Esgar Barnick ([id number]) on 03/22/2024 11:35:39 AM    Risk Assessment/Calculations:    CHA2DS2-VASc Score = 8   This indicates a 10.8% annual risk of stroke. The patient's score is based upon: CHF History: 1 HTN History: 1 Diabetes History: 0 Stroke History: 2 Vascular Disease History: 1 Age Score: 2 Gender Score: 1            Physical Exam:   VS:  BP 116/80 (BP Location: Left Arm, Patient Position: Sitting, Cuff Size: Normal)   Pulse (!) 56   Ht 5' 4 (1.626 m)   Wt 180 lb (81.6 kg)   SpO2 94%   BMI 30.90 kg/m    Wt Readings from Last 3 Encounters:  03/22/24  180 lb (81.6 kg)  03/09/24 180 lb (81.6 kg)  02/17/24 186 lb 1.1 oz (84.4 kg)     GEN: Well nourished, well developed in no acute distress NECK: No JVD; No carotid bruits CARDIAC: Regular rate and rhythm, no murmurs, rubs, gallops RESPIRATORY:  Clear to auscultation without rales, wheezing or rhonchi  ABDOMEN: Soft, non-tender, non-distended EXTREMITIES:  No edema; No deformity   ASSESSMENT AND PLAN:    1.  Persistent atrial fibrillation: On metoprolol , diltiazem , flecainide.  Post cardioversion 02/22/2024.  She has a right bundle branch block, but it is similar in morphology on on her current dose of flecainide compared to her QRS prior to initiation.  She is happy with her control.  She does not want to make any adjustments and does not want any procedures at this time.  If she does have recurrent arrhythmias, would likely need an alternative antiarrhythmic compared to increasing her flecainide due to her right bundle branch block.  2.  Secondary hypercoagulable date: On Eliquis   3.  Hypertension: Well-controlled  4.  Chronic diastolic heart failure: No volume overload  5.  High risk medication monitoring: QRS remains wide and a right bundle branch pattern.  It is similar to her QRS prior to initiation of flecainide.  Idan Prime continue with current management.  Follow up with EP Team in 6 months  Signed, Sherwin Hollingshed Gladis Norton, MD

## 2024-03-22 NOTE — Patient Instructions (Signed)

## 2024-03-25 ENCOUNTER — Emergency Department (HOSPITAL_COMMUNITY)

## 2024-03-25 ENCOUNTER — Other Ambulatory Visit: Payer: Self-pay

## 2024-03-25 ENCOUNTER — Encounter (HOSPITAL_COMMUNITY): Payer: Self-pay | Admitting: *Deleted

## 2024-03-25 ENCOUNTER — Observation Stay (HOSPITAL_COMMUNITY)
Admission: EM | Admit: 2024-03-25 | Discharge: 2024-03-28 | Disposition: A | Discharge status: Home / Self Care | Attending: Family Medicine | Admitting: Family Medicine

## 2024-03-25 DIAGNOSIS — E669 Obesity, unspecified: Secondary | ICD-10-CM | POA: Insufficient documentation

## 2024-03-25 DIAGNOSIS — I4892 Unspecified atrial flutter: Principal | ICD-10-CM | POA: Diagnosis present

## 2024-03-25 DIAGNOSIS — Z6831 Body mass index (BMI) 31.0-31.9, adult: Secondary | ICD-10-CM | POA: Diagnosis not present

## 2024-03-25 DIAGNOSIS — R41841 Cognitive communication deficit: Secondary | ICD-10-CM | POA: Insufficient documentation

## 2024-03-25 DIAGNOSIS — I5032 Chronic diastolic (congestive) heart failure: Secondary | ICD-10-CM | POA: Diagnosis present

## 2024-03-25 DIAGNOSIS — R42 Dizziness and giddiness: Secondary | ICD-10-CM | POA: Diagnosis present

## 2024-03-25 DIAGNOSIS — R4701 Aphasia: Secondary | ICD-10-CM | POA: Diagnosis present

## 2024-03-25 DIAGNOSIS — N1831 Chronic kidney disease, stage 3a: Secondary | ICD-10-CM | POA: Diagnosis present

## 2024-03-25 DIAGNOSIS — I130 Hypertensive heart and chronic kidney disease with heart failure and stage 1 through stage 4 chronic kidney disease, or unspecified chronic kidney disease: Secondary | ICD-10-CM | POA: Diagnosis not present

## 2024-03-25 DIAGNOSIS — E782 Mixed hyperlipidemia: Secondary | ICD-10-CM | POA: Diagnosis not present

## 2024-03-25 DIAGNOSIS — Z87891 Personal history of nicotine dependence: Secondary | ICD-10-CM | POA: Diagnosis not present

## 2024-03-25 DIAGNOSIS — Z8673 Personal history of transient ischemic attack (TIA), and cerebral infarction without residual deficits: Secondary | ICD-10-CM | POA: Diagnosis not present

## 2024-03-25 DIAGNOSIS — Z7901 Long term (current) use of anticoagulants: Secondary | ICD-10-CM | POA: Insufficient documentation

## 2024-03-25 DIAGNOSIS — Z79899 Other long term (current) drug therapy: Secondary | ICD-10-CM | POA: Diagnosis not present

## 2024-03-25 DIAGNOSIS — R918 Other nonspecific abnormal finding of lung field: Secondary | ICD-10-CM | POA: Diagnosis present

## 2024-03-25 DIAGNOSIS — J449 Chronic obstructive pulmonary disease, unspecified: Secondary | ICD-10-CM | POA: Diagnosis present

## 2024-03-25 DIAGNOSIS — I483 Typical atrial flutter: Secondary | ICD-10-CM | POA: Diagnosis not present

## 2024-03-25 DIAGNOSIS — I480 Paroxysmal atrial fibrillation: Secondary | ICD-10-CM | POA: Diagnosis not present

## 2024-03-25 DIAGNOSIS — R911 Solitary pulmonary nodule: Secondary | ICD-10-CM | POA: Insufficient documentation

## 2024-03-25 LAB — CBC
HCT: 43.9 % (ref 36.0–46.0)
Hemoglobin: 13.8 g/dL (ref 12.0–15.0)
MCH: 29 pg (ref 26.0–34.0)
MCHC: 31.4 g/dL (ref 30.0–36.0)
MCV: 92.2 fL (ref 80.0–100.0)
Platelets: 283 K/uL (ref 150–400)
RBC: 4.76 MIL/uL (ref 3.87–5.11)
RDW: 14.8 % (ref 11.5–15.5)
WBC: 10 K/uL (ref 4.0–10.5)
nRBC: 0 % (ref 0.0–0.2)

## 2024-03-25 LAB — TROPONIN I (HIGH SENSITIVITY)
Troponin I (High Sensitivity): 12 ng/L (ref <18)
Troponin I (High Sensitivity): 13 ng/L (ref <18)

## 2024-03-25 LAB — BASIC METABOLIC PANEL WITH GFR
Anion gap: 11 (ref 5–15)
BUN: 17 mg/dL (ref 8–23)
CO2: 22 mmol/L (ref 22–32)
Calcium: 9.1 mg/dL (ref 8.9–10.3)
Chloride: 109 mmol/L (ref 98–111)
Creatinine, Ser: 1.23 mg/dL — ABNORMAL HIGH (ref 0.44–1.00)
GFR, Estimated: 45 mL/min — ABNORMAL LOW (ref >60)
Glucose, Bld: 97 mg/dL (ref 70–99)
Potassium: 4.4 mmol/L (ref 3.5–5.1)
Sodium: 142 mmol/L (ref 135–145)

## 2024-03-25 MED ORDER — DILTIAZEM HCL 25 MG/5ML IV SOLN
5.0000 mg | Freq: Once | INTRAVENOUS | Status: AC
  Administered 2024-03-25: 5 mg via INTRAVENOUS
  Filled 2024-03-25: qty 5

## 2024-03-25 MED ORDER — FLECAINIDE ACETATE 50 MG PO TABS
50.0000 mg | ORAL_TABLET | Freq: Once | ORAL | Status: AC
  Administered 2024-03-25: 50 mg via ORAL
  Filled 2024-03-25: qty 1

## 2024-03-25 MED ORDER — LACTATED RINGERS IV BOLUS
1000.0000 mL | Freq: Once | INTRAVENOUS | Status: AC
  Administered 2024-03-25: 1000 mL via INTRAVENOUS

## 2024-03-25 NOTE — ED Provider Notes (Signed)
 [place] EMERGENCY DEPARTMENT AT [facility] Provider Note   CSN: [id number] Arrival date & time: 03/25/24  1453     Patient presents with: No chief complaint on file.   Michelle Dean is a 78 y.o. female.   78 year old female with history of A-fib/flutter on flecainide, diltiazem  and metoprolol  at home.  Also on Eliquis .  Patient has been in her normal state of health until this morning when she woke up shortly after she started feeling palpitations.  She states these continue throughout the day so presented here for further evaluation.  Patient states that she has an issue with her balance as well where she keeps feeling she might fall down.  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  History was somewhat difficult secondary to this and some slight memory loss.  She does not know who the president is or what day of the week it is but she does know that she is in a hospital, her son's name, year and month.  She denies any chest pain, shortness of breath.  States her legs are always swollen and there is been no change recently.  No recent infections that she knows of.        Prior to Admission medications   Medication Sig Start Date End Date Taking? Authorizing Provider  acetaminophen  (TYLENOL ) 500 MG tablet Take 500 mg by mouth every 6 (six) hours as needed for moderate pain (pain score 4-6).    [provider]  albuterol  (PROVENTIL ) (2.5 MG/3ML) 0.083% nebulizer solution Take 2.5 mg by nebulization every 4 (four) hours as needed for wheezing. 02/16/24   [provider]  atorvastatin  (LIPITOR) 10 MG tablet Take 1 tablet (10 mg total) by mouth daily. 03/18/21   Angiulli, Toribio PARAS, PA-C  carboxymethylcellulose (REFRESH PLUS) 0.5 % SOLN Place 1 drop into both eyes 4 (four) times daily as needed (dry eyes).    [provider]  cholecalciferol (VITAMIN D) 25 MCG (1000 UNIT) tablet Take 2,000 Units by mouth daily.    [provider]   diltiazem  (CARDIZEM  CD) 120 MG 24 hr capsule Take 1 capsule (120 mg total) by mouth every evening. 02/22/24   Drusilla Sabas RAMAN, MD  ELIQUIS  5 MG TABS tablet TAKE ONE TABLET TWICE A DAY. 10/08/23   Tolia, Sunit, DO  empagliflozin  (JARDIANCE ) 10 MG TABS tablet Take 1 tablet (10 mg total) by mouth daily. Patient not taking: Reported on 03/22/2024 08/27/23   Tolia, Sunit, DO  flecainide (TAMBOCOR) 50 MG tablet Take 1 tablet (50 mg total) by mouth every 12 (twelve) hours. 02/22/24   Drusilla Sabas RAMAN, MD  magnesium  oxide (MAG-OX) 400 (240 Mg) MG tablet Take 1 tablet by mouth daily. 03/11/24   [provider]  metoprolol  succinate (TOPROL  XL) 25 MG 24 hr tablet Take 0.5 tablets (12.5 mg total) by mouth at bedtime. 02/22/24 06/21/24  Drusilla Sabas RAMAN, MD  mometasone -formoterol  (DULERA ) 100-5 MCG/ACT AERO Inhale 2 puffs into the lungs 2 (two) times daily. 03/18/21   Angiulli, Toribio PARAS, PA-C  Multiple Vitamins-Minerals (CERTAVITE/ANTIOXIDANTS) TABS Take 1 tablet by mouth daily. 03/08/21   de Clint Kill, Cortney E, NP  omeprazole  (PRILOSEC) 20 MG capsule Take 1 capsule (20 mg total) by mouth daily. 03/18/21   Angiulli, Daniel J, PA-C  Psyllium (DAILY FIBER PO) Take 160 mg by mouth daily.    [provider]  senna-docusate (SENOKOT-S) 8.6-50 MG tablet Take 2 tablets by mouth 2 (two) times daily. 03/18/21  Angiulli, Toribio PARAS, PA-C  umeclidinium bromide  (INCRUSE ELLIPTA ) 62.5 MCG/ACT AEPB Inhale 1 puff into the lungs daily. 04/09/21   [provider]    Allergies: Aspirin  and Penicillins    Review of Systems  Updated Vital Signs BP 133/88   Pulse (!) 125   Temp 98.6 F (37 C) (Oral)   Resp (!) 22   Ht 5' 4 (1.626 m)   Wt 81.6 kg   SpO2 98%   BMI 30.88 kg/m   Physical Exam Vitals and nursing note reviewed.  Constitutional:      Appearance: She is well-developed.  HENT:     Head: Normocephalic and atraumatic.     Mouth/Throat:     Pharynx: No oropharyngeal exudate or posterior  oropharyngeal erythema.  Cardiovascular:     Rate and Rhythm: Regular rhythm. Tachycardia present.  Pulmonary:     Effort: No respiratory distress.     Breath sounds: No stridor.  Abdominal:     General: There is no distension.  Musculoskeletal:        General: No swelling or tenderness. Normal range of motion.     Cervical back: Normal range of motion.  Skin:    General: Skin is warm and dry.  Neurological:     General: No focal deficit present.     Mental Status: She is alert.     (all labs ordered are listed, but only abnormal results are displayed) Labs Reviewed  BASIC METABOLIC PANEL WITH GFR - Abnormal; Notable for the following components:      Result Value   Creatinine, Ser 1.23 (*)    GFR, Estimated 45 (*)    All other components within normal limits  CBC  FLECAINIDE LEVEL  MAGNESIUM   TROPONIN I (HIGH SENSITIVITY)  TROPONIN I (HIGH SENSITIVITY)    EKG: EKG Interpretation Date/Time:  Friday March 25 2024 15:14:34 EST Ventricular Rate:  124 PR Interval:  160 QRS Duration:  138 QT Interval:  362 QTC Calculation: 520 R Axis:   -64  Text Interpretation: Sinus tachycardia Left axis deviation Right bundle branch block T wave abnormality, consider inferolateral ischemia Abnormal ECG When compared with ECG of 22-Mar-2024 11:24, PREVIOUS ECG IS PRESENT similar rhythm and morphology compared to multiple recent Confirmed by Lorette Mayo [phone]) on 03/25/2024 10:59:31 PM  Radiology: [facility] Chest 2 View Result Date: 03/25/2024 CLINICAL DATA:  Dizziness and tachycardia. EXAM: CHEST - 2 VIEW COMPARISON:  February 15, 2024 FINDINGS: The cardiac silhouette is borderline in size and stable in appearance. Moderate severity calcification of the aortic arch is seen. There is very mild left infrahilar linear scarring and/or atelectasis. No acute infiltrate, pleural effusion or pneumothorax is identified. The visualized skeletal structures are unremarkable. IMPRESSION: No acute  cardiopulmonary disease. Electronically Signed   By: Suzen Dials M.D.   On: 03/25/2024 16:28     .Critical Care  Performed by: Lorette Mayo, MD Authorized by: Lorette Mayo, MD   Critical care provider statement:    Critical care time (minutes):  30   Critical care was necessary to treat or prevent imminent or life-threatening deterioration of the following conditions:  Cardiac failure   Critical care was time spent personally by me on the following activities:  Development of treatment plan with patient or surrogate, discussions with consultants, evaluation of patient's response to treatment, examination of patient, ordering and review of laboratory studies, ordering and review of radiographic studies, ordering and performing treatments and interventions, pulse oximetry, re-evaluation of patient's condition and review of  old charts    Medications Ordered in the ED  lactated ringers  bolus 1,000 mL (has no administration in time range)  flecainide (TAMBOCOR) tablet 50 mg (has no administration in time range)  diltiazem  (CARDIZEM ) injection 5 mg (has no administration in time range)                                    Medical Decision Making Amount and/or Complexity of Data Reviewed Labs: ordered. Radiology: ordered.  Risk Prescription drug management. Decision regarding hospitalization.  Unclear historian.  Patient reportedly administers her medications herself and wonder if she has been taking them appropriately.  Will get a flecainide level and give her night dose of flecainide, Eliquis .  Will give her a touch of diltiazem .  EKG looks pretty regular.  Could be a flutter versus sinus however when she was at the doctor's office a couple days ago her heart rate was closer to 60.  Will evaluate for stroke since she has balance issues along with this new rhythm change. Patient's already improved some with a diltiazem  bolus.  Also gave her flecainide.  Check flecainide level.   Patient still feeling unwell with likely a flutter no other obvious cause.  Tachycardia so will admit to the hospitalist for further management on a diltiazem  infusion at least until her heart rate improved to her baseline.  Discussed with hospitalist.  Final diagnoses:  Typical atrial flutter [facility])    ED Discharge Orders     None          Jacquelene Kopecky, Selinda, MD 03/27/24 0700

## 2024-03-25 NOTE — ED Triage Notes (Signed)
 Pt reports HR has been high 150s all day. Was just released from hospital 2 weeks ago for same. Family member reports they shocked her heart for same.

## 2024-03-25 NOTE — ED Provider Triage Note (Signed)
 Emergency Medicine Provider Triage Evaluation Note  Tyaira Heward , a 78 y.o. female  was evaluated in triage.  Pt complains of dizzy. Endorse feeling dizzy with walking since yesterday.  HR has been fluctuating.  Was recently admit to the hospital for afib and sxs felt the same. No cp, no sob.  Is on eliquis  but unsure if she has been compliant  Review of Systems  Positive: As above Negative: As above  Physical Exam  BP (!) 143/86 (BP Location: Right Arm)   Pulse (!) 119   Resp 18   Ht 5' 4 (1.626 m)   Wt 81.6 kg   SpO2 95%   BMI 30.88 kg/m  Gen:   Awake, no distress   Resp:  Normal effort  MSK:   Moves extremities without difficulty  Other:    Medical Decision Making  Medically screening exam initiated at 3:34 PM.  Appropriate orders placed.  Jaydynn Wolford was informed that the remainder of the evaluation will be completed by another provider, this initial triage assessment does not replace that evaluation, and the importance of remaining in the ED until their evaluation is complete.     Nivia Colon, PA-C 03/25/24 1535

## 2024-03-26 ENCOUNTER — Emergency Department (HOSPITAL_COMMUNITY)

## 2024-03-26 DIAGNOSIS — I10 Essential (primary) hypertension: Secondary | ICD-10-CM

## 2024-03-26 DIAGNOSIS — N183 Chronic kidney disease, stage 3 unspecified: Secondary | ICD-10-CM | POA: Diagnosis not present

## 2024-03-26 DIAGNOSIS — I509 Heart failure, unspecified: Secondary | ICD-10-CM

## 2024-03-26 DIAGNOSIS — I4819 Other persistent atrial fibrillation: Secondary | ICD-10-CM

## 2024-03-26 DIAGNOSIS — I483 Typical atrial flutter: Secondary | ICD-10-CM | POA: Diagnosis not present

## 2024-03-26 DIAGNOSIS — I5030 Unspecified diastolic (congestive) heart failure: Secondary | ICD-10-CM

## 2024-03-26 DIAGNOSIS — I480 Paroxysmal atrial fibrillation: Secondary | ICD-10-CM | POA: Diagnosis not present

## 2024-03-26 DIAGNOSIS — E782 Mixed hyperlipidemia: Secondary | ICD-10-CM | POA: Diagnosis not present

## 2024-03-26 DIAGNOSIS — R4701 Aphasia: Secondary | ICD-10-CM

## 2024-03-26 DIAGNOSIS — E785 Hyperlipidemia, unspecified: Secondary | ICD-10-CM | POA: Diagnosis not present

## 2024-03-26 DIAGNOSIS — I4892 Unspecified atrial flutter: Secondary | ICD-10-CM

## 2024-03-26 DIAGNOSIS — R911 Solitary pulmonary nodule: Secondary | ICD-10-CM

## 2024-03-26 LAB — URINALYSIS, ROUTINE W REFLEX MICROSCOPIC
Bacteria, UA: NONE SEEN
Bilirubin Urine: NEGATIVE
Glucose, UA: >=500 mg/dL — AB
Hgb urine dipstick: NEGATIVE
Ketones, ur: NEGATIVE mg/dL
Nitrite: NEGATIVE
Protein, ur: NEGATIVE mg/dL
Specific Gravity, Urine: >1.046 — ABNORMAL HIGH (ref 1.005–1.030)
pH: 6 (ref 5.0–8.0)

## 2024-03-26 MED ORDER — DILTIAZEM HCL-DEXTROSE 125-5 MG/125ML-% IV SOLN (PREMIX)
5.0000 mg/h | INTRAVENOUS | Status: DC
  Administered 2024-03-26: 5 mg/h via INTRAVENOUS
  Filled 2024-03-26 (×2): qty 125

## 2024-03-26 MED ORDER — DILTIAZEM HCL-DEXTROSE 125-5 MG/125ML-% IV SOLN (PREMIX)
5.0000 mg/h | INTRAVENOUS | Status: DC
  Administered 2024-03-26: 5 mg/h via INTRAVENOUS
  Filled 2024-03-26: qty 125

## 2024-03-26 MED ORDER — ACETAMINOPHEN 325 MG PO TABS: 650.0000 mg | ORAL_TABLET | Freq: Four times a day (QID) | ORAL | Status: DC | PRN

## 2024-03-26 MED ORDER — DILTIAZEM HCL ER COATED BEADS 120 MG PO CP24: 120.0000 mg | ORAL_CAPSULE | Freq: Every day | ORAL | Status: DC

## 2024-03-26 MED ORDER — ACETAMINOPHEN 650 MG RE SUPP: 650.0000 mg | Freq: Four times a day (QID) | RECTAL | Status: DC | PRN

## 2024-03-26 MED ORDER — IOHEXOL 350 MG/ML SOLN
75.0000 mL | Freq: Once | INTRAVENOUS | Status: AC | PRN
  Administered 2024-03-26: 75 mL via INTRAVENOUS

## 2024-03-26 MED ORDER — APIXABAN 5 MG PO TABS
5.0000 mg | ORAL_TABLET | Freq: Two times a day (BID) | ORAL | Status: DC
  Administered 2024-03-26: 5 mg via ORAL
  Filled 2024-03-26: qty 1

## 2024-03-26 MED ORDER — HEPARIN (PORCINE) 25000 UT/250ML-% IV SOLN
1100.0000 [IU]/h | INTRAVENOUS | Status: DC
  Administered 2024-03-26: 1000 [IU]/h via INTRAVENOUS
  Administered 2024-03-27: 1150 [IU]/h via INTRAVENOUS
  Administered 2024-03-28: 1100 [IU]/h via INTRAVENOUS
  Filled 2024-03-26 (×3): qty 250

## 2024-03-26 NOTE — Progress Notes (Signed)
 PHARMACY - ANTICOAGULATION CONSULT NOTE  Pharmacy Consult for heparin   Indication: atrial fibrillation  Allergies  Allergen Reactions   Aspirin      Other reaction(s): Bleeding   Penicillins Hives and Rash    Patient Measurements: Height: 5' 4 (162.6 cm) Weight: 81.6 kg (179 lb 14.3 oz) IBW/kg (Calculated) : 54.7 HEPARIN  DW (KG): 72.3  Vital Signs: Temp: 97.7 F (36.5 C) (11/15 0320) Temp Source: Oral (11/15 0320) BP: 127/85 (11/15 0700) Pulse Rate: 98 (11/15 0701)  Labs: Recent Labs    03/25/24 1533 03/25/24 1746  HGB 13.8  --   HCT 43.9  --   PLT 283  --   CREATININE 1.23*  --   TROPONINIHS 12 13    Estimated Creatinine Clearance: 38.3 mL/min (A) (by C-G formula based on SCr of 1.23 mg/dL (H)).   Medical History: Past Medical History:  Diagnosis Date   (HFpEF) heart failure with preserved ejection fraction (HCC)    Hyperlipidemia    Hypertension    Paroxysmal atrial fibrillation (HCC)    RBBB    Stroke [facility])     Medications:  (Not in a hospital admission)  Scheduled:  Infusions:   diltiazem  (CARDIZEM ) infusion 15 mg/hr (03/26/24 0451)   PRN: acetaminophen  **OR** acetaminophen   Assessment: Hx of persistent atrial fibrillation with recent DCCV, chronic HFpEF, left MCA stroke, hyperlipidemia, COPD, pulmonary nodules, hypertension, aortic atherosclerosis.   Now in Afib / aflutter RVR. Cards consulted. Potential for bronchoscopy this admission. On Eliquis  PTA. Receive a 5 mg dose 11/15 03:51.  Hb 13.8, plt 283  Goal of Therapy:  Heparin  level 0.3-0.7 units/ml aPTT 66-102 seconds Monitor platelets by anticoagulation protocol: Yes   Plan: Heparin  will be started 12 hours after last dose of Eliquis . No bolus.  Start heparin  infusion at 1000 units/hr Check anti-Xa level and aPTT in 8 hours and daily while on heparin . Check aPTT until correlation with HL.  Continue to monitor H&H and platelets  Rankin Sams 03/26/2024,8:58 AM

## 2024-03-26 NOTE — H&P (Addendum)
 History and Physical    Patient: Michelle Dean DOB: Apr 02, 1946 DOA: 03/25/2024 DOS: the patient was seen and examined on 03/26/2024 PCP: Bonney Lynwood BRAVO, MD  Patient coming from: Home  Chief Complaint: No chief complaint on file.  HPI: Michelle Dean is a 78 y.o. female with medical history significant of hypertension, hyperlipidemia, CKD 3A, history of CVA, PAF on Eliquis , and chronic HFpEF presenting with A-fib with RVR.  Limited history in the setting of patient being overall poor historian.  Per report, patient with high heart rate at home.  Patient reports?  Palpitations.  No chest pain or shortness of breath.  No nausea or vomiting.  Patient noted to have been admitted October 6 through October 13 for issues including A-fib with RVR.  Was initially started on IV diltiazem  during that time and transitioned to oral metoprolol  and Cardizem .  Had cardioversion done.  Was discharged on course of Cardizem , metoprolol  as well as flecainide.  At present, patient unclear of what her medication regimen is.  Does live at home alone.  Does report independent ADLs.  No belly pain or diarrhea.  No focal hemiparesis.  Positive mild generalized confusion. Presented to the ER afebrile, heart rate into the 120s, blood pressure 90s to 140s over 70s to 100s.  Satting well on room air.  White count 10, hemoglobin 13.8, platelets 283.  Urinalysis minimally indicative of infection.  Troponin negative x 2.  Creatinine 1.23 with GFR in the 40s.  Started on diltiazem  drip in the ER.  Heart rate now in the 80s.  CT of the head and neck negative for any acute LVO.  Noted spiculated nodule in the left upper lobe with recent evaluation for similar in May of last year. + Positive concern for bronchogenic carcinoma. Review of Systems: As mentioned in the history of present illness. All other systems reviewed and are negative. Past Medical History:  Diagnosis Date   (HFpEF) heart failure with preserved ejection  fraction (HCC)    Hyperlipidemia    Hypertension    Paroxysmal atrial fibrillation (HCC)    RBBB    Stroke [facility])    Past Surgical History:  Procedure Laterality Date   BUBBLE STUDY  03/01/2021   Procedure: BUBBLE STUDY;  Surgeon: Michele Richardson, DO;  Location: [facility] ENDOSCOPY;  Service: Cardiovascular;;   CARDIOVERSION N/A 03/01/2021   Procedure: CARDIOVERSION;  Surgeon: Michele Richardson, DO;  Location: [facility] ENDOSCOPY;  Service: Cardiovascular;  Laterality: N/A;   CARDIOVERSION N/A 03/05/2021   Procedure: CARDIOVERSION;  Surgeon: Ladona Heinz, MD;  Location: [facility] ENDOSCOPY;  Service: Cardiovascular;  Laterality: N/A;   CARDIOVERSION N/A 02/22/2024   Procedure: CARDIOVERSION;  Surgeon: Francyne Headland, MD;  Location: [facility] INVASIVE CV LAB;  Service: Cardiovascular;  Laterality: N/A;   IR CT HEAD LTD  02/25/2021   IR PERCUTANEOUS ART THROMBECTOMY/INFUSION INTRACRANIAL INC DIAG ANGIO  02/25/2021   RADIOLOGY WITH ANESTHESIA N/A 02/25/2021   Procedure: IR WITH ANESTHESIA;  Surgeon: Radiologist, Medication, MD;  Location: [facility] OR;  Service: Radiology;  Laterality: N/A;   TEE WITHOUT CARDIOVERSION N/A 03/01/2021   Procedure: TRANSESOPHAGEAL ECHOCARDIOGRAM (TEE);  Surgeon: Michele Richardson, DO;  Location: [facility] ENDOSCOPY;  Service: Cardiovascular;  Laterality: N/A;   Social History:  reports that she quit smoking about 25 years ago. Her smoking use included cigarettes. She started smoking about 52 years ago. She has a 27 pack-year smoking history. She has never been exposed to tobacco smoke. She has never used smokeless tobacco. She reports that she does not  drink alcohol and does not use drugs.  Allergies  Allergen Reactions   Aspirin      Other reaction(s): Bleeding   Penicillins Hives and Rash    Family History  Problem Relation Age of Onset   Heart attack Mother    Heart disease Mother    Cancer Mother 69   Kidney failure Father    Colon cancer Sister     Prior to Admission medications   Medication Sig  Start Date End Date Taking? Authorizing Provider  acetaminophen  (TYLENOL ) 500 MG tablet Take 500 mg by mouth every 6 (six) hours as needed for moderate pain (pain score 4-6).    [provider]  albuterol  (PROVENTIL ) (2.5 MG/3ML) 0.083% nebulizer solution Take 2.5 mg by nebulization every 4 (four) hours as needed for wheezing. 02/16/24   [provider]  atorvastatin  (LIPITOR) 10 MG tablet Take 1 tablet (10 mg total) by mouth daily. 03/18/21   Angiulli, Toribio PARAS, PA-C  carboxymethylcellulose (REFRESH PLUS) 0.5 % SOLN Place 1 drop into both eyes 4 (four) times daily as needed (dry eyes).    [provider]  cholecalciferol (VITAMIN D) 25 MCG (1000 UNIT) tablet Take 2,000 Units by mouth daily.    [provider]  diltiazem  (CARDIZEM  CD) 120 MG 24 hr capsule Take 1 capsule (120 mg total) by mouth every evening. 02/22/24   Drusilla Sabas RAMAN, MD  ELIQUIS  5 MG TABS tablet TAKE ONE TABLET TWICE A DAY. 10/08/23   Tolia, Sunit, DO  empagliflozin  (JARDIANCE ) 10 MG TABS tablet Take 1 tablet (10 mg total) by mouth daily. Patient not taking: Reported on 03/22/2024 08/27/23   Tolia, Sunit, DO  flecainide (TAMBOCOR) 50 MG tablet Take 1 tablet (50 mg total) by mouth every 12 (twelve) hours. 02/22/24   Drusilla Sabas RAMAN, MD  magnesium  oxide (MAG-OX) 400 (240 Mg) MG tablet Take 1 tablet by mouth daily. 03/11/24   [provider]  metoprolol  succinate (TOPROL  XL) 25 MG 24 hr tablet Take 0.5 tablets (12.5 mg total) by mouth at bedtime. 02/22/24 06/21/24  Drusilla Sabas RAMAN, MD  mometasone -formoterol  (DULERA ) 100-5 MCG/ACT AERO Inhale 2 puffs into the lungs 2 (two) times daily. 03/18/21   Angiulli, Toribio PARAS, PA-C  Multiple Vitamins-Minerals (CERTAVITE/ANTIOXIDANTS) TABS Take 1 tablet by mouth daily. 03/08/21   de Clint Kill, Cortney E, NP  omeprazole  (PRILOSEC) 20 MG capsule Take 1 capsule (20 mg total) by mouth daily. 03/18/21   Angiulli, Daniel J, PA-C  Psyllium (DAILY FIBER PO) Take 160 mg by  mouth daily.    [provider]  senna-docusate (SENOKOT-S) 8.6-50 MG tablet Take 2 tablets by mouth 2 (two) times daily. 03/18/21   Angiulli, Toribio PARAS, PA-C  umeclidinium bromide  (INCRUSE ELLIPTA ) 62.5 MCG/ACT AEPB Inhale 1 puff into the lungs daily. 04/09/21   [provider]    Physical Exam: Vitals:   03/26/24 0615 03/26/24 0645 03/26/24 0700 03/26/24 0701  BP: 114/86 123/78 127/85   Pulse: (!) 113 (!) 115 (!) 114 98  Resp: 18 (!) 21 (!) 21 20  Temp:      TempSrc:      SpO2: 95% 99% 96% 99%  Weight:      Height:       Physical Exam Constitutional:      Appearance: She is normal weight.  HENT:     Head: Normocephalic and atraumatic.     Nose: Nose normal.     Mouth/Throat:     Mouth: Mucous membranes are moist.  Eyes:     Pupils: Pupils are equal, round, and reactive to light.  Cardiovascular:     Rate and Rhythm: Normal rate and regular rhythm.  Pulmonary:     Effort: Pulmonary effort is normal.  Abdominal:     General: Bowel sounds are normal.  Musculoskeletal:        General: Normal range of motion.  Skin:    General: Skin is warm.  Neurological:     General: No focal deficit present.     Comments: Aware of person and part of place  Unaware of time (date, day) Grossly nonfocal neuro exam    Psychiatric:        Mood and Affect: Mood normal.     Data Reviewed:  There are no new results to review at this time.  CT ANGIO HEAD NECK W WO CM CLINICAL DATA:  Initial evaluation for acute neuro deficit, stroke.  EXAM: CT ANGIOGRAPHY HEAD AND NECK WITH AND WITHOUT CONTRAST  TECHNIQUE: Multidetector CT imaging of the head and neck was performed using the standard protocol during bolus administration of intravenous contrast. Multiplanar CT image reconstructions and MIPs were obtained to evaluate the vascular anatomy. Carotid stenosis measurements (when applicable) are obtained utilizing NASCET criteria, using the distal internal carotid  diameter as the denominator.  RADIATION DOSE REDUCTION: This exam was performed according to the departmental dose-optimization program which includes automated exposure control, adjustment of the mA and/or kV according to patient size and/or use of iterative reconstruction technique.  CONTRAST:  75mL OMNIPAQUE  IOHEXOL  350 MG/ML SOLN  COMPARISON:  Prior study from 02/25/2021  FINDINGS: CT HEAD FINDINGS  Brain: Cerebral volume within normal limits. Mild chronic microvascular ischemic disease. Remote lacunar infarct at the left basal ganglia. Encephalomalacia involving the left temporal occipital region, consistent with a chronic left MCA territory infarct. No acute intracranial hemorrhage. No acute large vessel territory infarct. No mass lesion or midline shift. No hydrocephalus or extra-axial fluid collection.  Vascular: No abnormal hyperdense vessel. Calcified atherosclerosis present at the skull base.  Skull: Scalp soft tissues within normal limits for age calvarium intact.  Sinuses/Orbits: Globes and orbital soft tissues within normal limits. Paranasal sinuses and mastoid air cells are largely clear.  Other: None.  Review of the MIP images confirms the above findings  CTA NECK FINDINGS  Aortic arch: Visualized aortic arch within normal limits for caliber with standard 3 vessel morphology. Mild aortic atherosclerosis. No significant stenosis about the origin the great vessels.  Right carotid system: Right common and internal carotid arteries are patent without stenosis or dissection.  Left carotid system: Left common and internal carotid arteries are patent without stenosis or dissection.  Vertebral arteries: Both vertebral arteries arise from subclavian arteries. Vertebral arteries are patent without stenosis or dissection.  Skeleton: No worrisome osseous lesions.  Patient is edentulous.  Other neck: No other acute finding.  Upper chest: Emphysema.  Spiculated pulmonary nodule at the medial left upper lobe measures 2.3 x 2.8 x 1.9 cm, concerning for a primary bronchogenic carcinoma (series 14, image 22).  Review of the MIP images confirms the above findings  CTA HEAD FINDINGS  Anterior circulation: Mild atheromatous change about the carotid siphons without hemodynamically significant stenosis. A1 segments patent bilaterally. Left A1 dominant. Normal anterior communicating artery complex. Anterior cerebral arteries are patent without stenosis. No M1 stenosis or occlusion. No proximal MCA branch occlusion or high-grade stenosis. Distal MCA branches perfused and symmetric.  Posterior circulation: Both V4 segments patent without significant stenosis. Left  vertebral artery slightly dominant. Both PICA patent at their origins. Basilar patent without stenosis. Superior cerebellar and posterior cerebral arteries patent bilaterally.  Venous sinuses: Patent allowing for timing the contrast bolus.  Anatomic variants: As above.  No aneurysm.  Review of the MIP images confirms the above findings  IMPRESSION: CT HEAD:  1. No acute intracranial abnormality. 2. Chronic left MCA territory infarct. 3. Underlying mild chronic microvascular ischemic disease.  CTA HEAD AND NECK:  1. Negative CTA for large vessel occlusion or other emergent finding. 2. Mild atheromatous change about the carotid siphons without hemodynamically significant stenosis. 3. 2.3 x 2.8 x 1.9 cm spiculated pulmonary nodule at the medial left upper lobe, concerning for a primary bronchogenic carcinoma.  Aortic Atherosclerosis (ICD10-I70.0) and Emphysema (ICD10-J43.9).  Electronically Signed   By: Morene Hoard M.D.   On: 03/26/2024 01:09  Lab Results  Component Value Date   WBC 10.0 03/25/2024   HGB 13.8 03/25/2024   HCT 43.9 03/25/2024   MCV 92.2 03/25/2024   PLT 283 03/25/2024   Last metabolic panel Lab Results  Component Value Date   GLUCOSE  97 03/25/2024   NA 142 03/25/2024   K 4.4 03/25/2024   CL 109 03/25/2024   CO2 22 03/25/2024   BUN 17 03/25/2024   CREATININE 1.23 (H) 03/25/2024   GFRNONAA 45 (L) 03/25/2024   CALCIUM  9.1 03/25/2024   PROT 5.7 (L) 02/15/2024   ALBUMIN 3.2 (L) 02/15/2024   BILITOT 0.3 02/15/2024   ALKPHOS 56 02/15/2024   AST 20 02/15/2024   ALT 11 02/15/2024   ANIONGAP 11 03/25/2024    Assessment and Plan: Paroxysmal atrial fibrillation (HCC) Presenting with A-fib with RVR with heart rate into the 120s requiring diltiazem  drip on presentation Noted admission roughly 1 month ago for similar presentation requiring cardioversion and triple therapy with metoprolol , Cardizem  and flecainide Some concern for medication compliance at home with likely undiagnosed dementia Started on a Dilt drip in the ER Heart rate improved into the 80s at present Continue home regimen including flecainide and metoprolol  Continue Eliquis -consider transition to heparin  drip if patient is agreeable to bronchoscopy in house Follow-up cardiology recommendations  Pulmonary nodules Noted left upper lobe nodule that is effectively doubled from recent imaging Nodule has grown in size to 2.3x2.8x1.9cm from 1.4 x 1.7cm  since May 2025 S/p prior bronchoscopy with biopsy performed after PET CT- biopsy noncancerous  Dr. Kara with pulmonology consulted  Potential bronchoscopy during this admission pending discussion with patient and son  Follow up recommendations    COPD (chronic obstructive pulmonary disease) (HCC) Stable from a respiratory standpoint Continue home inhalers  History of stroke Positive remote history of CVA Has had some memory and word finding issues of undetermined duration CT of the head negative for any LVO or acute findings Will check MRI of the brain x 1 Otherwise continue regimen including statin and Eliquis   Chronic diastolic CHF (congestive heart failure) (HCC) 2D echo October 2025 with EF of 60  to 65% Valvular function otherwise stable apart from aortic stenosis Appears euvolemic Weight today 81.6 kg Monitor volume status  CKD stage 3a, GFR 45-59 ml/min (HCC) Cr 1.2 w/ GFR in the 40s  Looks to be near baseline    Mixed hyperlipidemia Continue lipitor   Aphasia Baseline transient word-finding difficulty mild confusion on presentation Unclear if this is an acute versus subacute issue Will formally check MRI of the brain to rule out CVA recurrence I do suspect there may be an element  of undiagnosed dementia on overall presentation Grossly nonfocal neuroexam Monitor closely      Advance Care Planning:   Code Status: Full Code   Consults: Pulmonology, Cardiology   Family Communication: No family at the bedside   Severity of Illness: The appropriate patient status for this patient is OBSERVATION. Observation status is judged to be reasonable and necessary in order to provide the required intensity of service to ensure the patient's safety. The patient's presenting symptoms, physical exam findings, and initial radiographic and laboratory data in the context of their medical condition is felt to place them at decreased risk for further clinical deterioration. Furthermore, it is anticipated that the patient will be medically stable for discharge from the hospital within 2 midnights of admission.   Author: Elspeth JINNY Masters, MD 03/26/2024 8:38 AM  For on call review [email or web address].

## 2024-03-26 NOTE — Assessment & Plan Note (Signed)
 Cr 1.2 w/ GFR in the 40s  Looks to be near baseline

## 2024-03-26 NOTE — Assessment & Plan Note (Addendum)
 Presenting with A-fib with RVR with heart rate into the 120s requiring diltiazem  drip on presentation Noted admission roughly 1 month ago for similar presentation requiring cardioversion and triple therapy with metoprolol , Cardizem  and flecainide Some concern for medication compliance at home with likely undiagnosed dementia Started on a Dilt drip in the ER Heart rate improved into the 80s at present Continue home regimen including flecainide and metoprolol  Continue Eliquis -consider transition to heparin  drip if patient is agreeable to bronchoscopy in house Follow-up cardiology recommendations

## 2024-03-26 NOTE — Assessment & Plan Note (Signed)
 Positive remote history of CVA Has had some memory and word finding issues of undetermined duration CT of the head negative for any LVO or acute findings Will check MRI of the brain x 1 Otherwise continue regimen including statin and Eliquis 

## 2024-03-26 NOTE — Assessment & Plan Note (Signed)
 2D echo October 2025 with EF of 60 to 65% Valvular function otherwise stable apart from aortic stenosis Appears euvolemic Weight today 81.6 kg Monitor volume status

## 2024-03-26 NOTE — Assessment & Plan Note (Addendum)
 Baseline transient word-finding difficulty mild confusion on presentation Unclear if this is an acute versus subacute issue Will formally check MRI of the brain to rule out CVA recurrence I do suspect there may be an element of undiagnosed dementia on overall presentation Grossly nonfocal neuroexam Monitor closely

## 2024-03-26 NOTE — Progress Notes (Signed)
  Carryover admission to the Day Admitter.  I discussed this case with the EDP, Dr. Lorette.  Per these discussions:   This is a 78 year old female with history of paroxysmal atrial fibrillation/atrial flutter, following with cardiology as an outpatient, who is being admitted with atrial flutter with RVR after presenting with palpitations associated with some dizziness, found to be in atrial flutter with heart rates in the 150s, relative to her baseline heart rate in the 60s.   There is some concern over some potential memory problems, raising the question of potential unintentional suboptimal compliance with her outpatient medications include flecainide, long-acting diltiazem  as well as metoprolol  in addition to her chronic anticoagulation on Eliquis .  She lives by herself.   In the ED, she received a dose of her home flecainide as well as diltiazem  5 mg IV x 1 dose followed by initiation of diltiazem  drip, with interval improvement in her heart rate into the 1 teens.  Most recent systolic blood pressures in the 140s mmHg. additionally, her home Eliquis  was resumed.  Magnesium  level is pending.  In the setting of her dizziness, CT head as well as CTA head and neck were obtained, and showed no evidence of acute stroke, intracranial hemorrhage, or large vessel occlusion.  However, imaging did not show evidence of an enlarging spiculated mass in the left upper lobe of the lung.  She reportedly has a known history of spiculated mass in the left upper lobe of the lung, although it appears that the size of this mass is doubled over the course of the last year.   EDP briefly discussed this finding with on-call pulmonology, Dr. Kara, who will see pt and anticipates making recommendations for outpatient follow-up in 1 week in the pulmonology clinic.  I have placed an order for Observation to PCU for further evaluation management of the above.  I have placed some additional preliminary admit orders via the  adult multi-morbid admission order set. I have continued existing order for diltiazem  drip. Fall precautions ordered.     Eva Pore, DO Hospitalist

## 2024-03-26 NOTE — ED Notes (Signed)
 Patient transported to CT

## 2024-03-26 NOTE — Assessment & Plan Note (Signed)
 Continue lipitor  ?

## 2024-03-26 NOTE — Assessment & Plan Note (Signed)
 Stable from a respiratory standpoint Continue home inhalers

## 2024-03-26 NOTE — Consult Note (Signed)
 Cardiology Consultation:   Patient ID: Michelle Dean; [id number]; 08/25/1945   Admit date: 03/25/2024 Date of Consult: 03/26/2024  Primary Care Provider: Bonney Lynwood BRAVO, MD Primary Cardiologist: Madonna Large, DO  Primary Electrophysiologist:  None   Patient Profile:   Michelle Dean is a 78 y.o. female with a hx of fibrillation who is being seen today for the evaluation of atrial flutter at the request of Dr. Eldonna.  History of Present Illness:   Michelle Dean has a history of heart failure with preserved ejection fraction.  She has persistent atrial fibrillation with previous cardioversion.  She has had previous left MCA stroke.  She has been managed predominantly with flecainide with the last cardioversion on 02/22/2024.  EP saw her and decided against ablation as she was doing relatively well.  She failed amiodarone  in the past.    Today she was admitted with some dizziness.  She appeared to be in a flutter with ventricular rate in the 150s.  She was treated with IV Cardizem .  Head CT demonstrated no acute findings.  Flecainide level was drawn.  These results are pending.  Pulmonary saw her because of a nodule concerning for cancer in the left upper lobe.  It is clear talking to the patient that there is some issue with receptive aphasia.  She really cannot give me any details about what she was experiencing or symptoms she is having.  She does not really feel her heart racing or skipping.  She does not recall dizziness.  She is not describing presyncope or syncope.  She has not been having any chest pain.  Past Medical History:  Diagnosis Date   (HFpEF) heart failure with preserved ejection fraction (HCC)    Hyperlipidemia    Hypertension    Paroxysmal atrial fibrillation (HCC)    RBBB    Stroke [facility])     Past Surgical History:  Procedure Laterality Date   BUBBLE STUDY  03/01/2021   Procedure: BUBBLE STUDY;  Surgeon: Large Madonna, DO;  Location: [facility] ENDOSCOPY;  Service:  Cardiovascular;;   CARDIOVERSION N/A 03/01/2021   Procedure: CARDIOVERSION;  Surgeon: Large Madonna, DO;  Location: [facility] ENDOSCOPY;  Service: Cardiovascular;  Laterality: N/A;   CARDIOVERSION N/A 03/05/2021   Procedure: CARDIOVERSION;  Surgeon: Ladona Heinz, MD;  Location: [facility] ENDOSCOPY;  Service: Cardiovascular;  Laterality: N/A;   CARDIOVERSION N/A 02/22/2024   Procedure: CARDIOVERSION;  Surgeon: Francyne Headland, MD;  Location: [facility] INVASIVE CV LAB;  Service: Cardiovascular;  Laterality: N/A;   IR CT HEAD LTD  02/25/2021   IR PERCUTANEOUS ART THROMBECTOMY/INFUSION INTRACRANIAL INC DIAG ANGIO  02/25/2021   RADIOLOGY WITH ANESTHESIA N/A 02/25/2021   Procedure: IR WITH ANESTHESIA;  Surgeon: Radiologist, Medication, MD;  Location: [facility] OR;  Service: Radiology;  Laterality: N/A;   TEE WITHOUT CARDIOVERSION N/A 03/01/2021   Procedure: TRANSESOPHAGEAL ECHOCARDIOGRAM (TEE);  Surgeon: Large Madonna, DO;  Location: [facility] ENDOSCOPY;  Service: Cardiovascular;  Laterality: N/A;     Home Medications:  Prior to Admission medications   Medication Sig Start Date End Date Taking? Authorizing Provider  acetaminophen  (TYLENOL ) 500 MG tablet Take 500 mg by mouth every 6 (six) hours as needed for moderate pain (pain score 4-6).   Yes [provider]  albuterol  (PROVENTIL ) (2.5 MG/3ML) 0.083% nebulizer solution Take 2.5 mg by nebulization every 4 (four) hours as needed for wheezing. 02/16/24  Yes [provider]  atorvastatin  (LIPITOR) 10 MG tablet Take 1 tablet (10 mg total) by mouth daily. 03/18/21  Yes Angiulli,  Toribio PARAS, PA-C  carboxymethylcellulose (REFRESH PLUS) 0.5 % SOLN Place 1 drop into both eyes 4 (four) times daily as needed (dry eyes).   Yes [provider]  cholecalciferol (VITAMIN D) 25 MCG (1000 UNIT) tablet Take 1,000 Units by mouth daily.   Yes [provider]  diltiazem  (CARDIZEM  CD) 120 MG 24 hr capsule Take 1 capsule (120 mg total) by mouth every evening. 02/22/24  Yes  Drusilla Sabas RAMAN, MD  ELIQUIS  5 MG TABS tablet TAKE ONE TABLET TWICE A DAY. 10/08/23  Yes Tolia, Sunit, DO  flecainide (TAMBOCOR) 50 MG tablet Take 1 tablet (50 mg total) by mouth every 12 (twelve) hours. 02/22/24  Yes Drusilla Sabas RAMAN, MD  magnesium  oxide (MAG-OX) 400 (240 Mg) MG tablet Take 1 tablet by mouth daily. 03/11/24  Yes [provider]  metoprolol  succinate (TOPROL  XL) 25 MG 24 hr tablet Take 0.5 tablets (12.5 mg total) by mouth at bedtime. 02/22/24 06/21/24 Yes Drusilla Sabas RAMAN, MD  mometasone -formoterol  (DULERA ) 100-5 MCG/ACT AERO Inhale 2 puffs into the lungs 2 (two) times daily. 03/18/21  Yes Angiulli, Toribio PARAS, PA-C  Multiple Vitamins-Minerals (CERTAVITE/ANTIOXIDANTS) TABS Take 1 tablet by mouth daily. 03/08/21  Yes de Clint Kill, Cortney E, NP  omeprazole  (PRILOSEC) 20 MG capsule Take 1 capsule (20 mg total) by mouth daily. 03/18/21  Yes Angiulli, Toribio PARAS, PA-C  senna-docusate (SENOKOT-S) 8.6-50 MG tablet Take 2 tablets by mouth 2 (two) times daily. Patient taking differently: Take 2 tablets by mouth 2 (two) times daily as needed for mild constipation. 03/18/21  Yes Angiulli, Toribio PARAS, PA-C  umeclidinium bromide  (INCRUSE ELLIPTA ) 62.5 MCG/ACT AEPB Inhale 1 puff into the lungs daily. 04/09/21  Yes [provider]    Inpatient Medications: Scheduled Meds:  Continuous Infusions:  diltiazem  (CARDIZEM ) infusion 15 mg/hr (03/26/24 0451)   PRN Meds: acetaminophen  **OR** acetaminophen   Allergies:    Allergies  Allergen Reactions   Aspirin      Other reaction(s): Bleeding   Penicillins Hives and Rash    Social History:   Social History   Socioeconomic History   Marital status: Widowed    Spouse name: Not on file   Number of children: 1   Years of education: Not on file   Highest education level: Not on file  Occupational History   Not on file  Tobacco Use   Smoking status: Former    Current packs/day: 0.00    Average packs/day: 1 pack/day for 27.0 years (27.0 ttl  pk-yrs)    Types: Cigarettes    Start date: 41    Quit date: 2000    Years since quitting: 25.8    Passive exposure: Never   Smokeless tobacco: Never  Vaping Use   Vaping status: Never Used  Substance and Sexual Activity   Alcohol use: Never   Drug use: Never   Sexual activity: Not Currently  Other Topics Concern   Not on file  Social History Narrative   Not on file   Social Drivers of Health   Financial Resource Strain: Low Risk  (11/02/2023)   Received from [facility]   Overall Financial Resource Strain (CARDIA)    Difficulty of Paying Living Expenses: Not hard at all  Food Insecurity: No Food Insecurity (02/16/2024)   Hunger Vital Sign    Worried About Running Out of Food in the Last Year: Never true    Ran Out of Food in the Last Year: Never true  Transportation Needs: No Transportation Needs (02/16/2024)  PRAPARE - Administrator, Civil Service (Medical): No    Lack of Transportation (Non-Medical): No  Physical Activity: Unknown (11/02/2023)   Received from [facility]   Exercise Vital Sign    On average, how many days per week do you engage in moderate to strenuous exercise (like a brisk walk)?: 0 days    Minutes of Exercise per Session: Not on file  Stress: Stress Concern Present (11/02/2023)   Received from [facility] - Occupational Stress Questionnaire    Feeling of Stress : Rather much  Social Connections: Socially Integrated (11/02/2023)   Received from [facility]   Social Network    How would you rate your social network (family, work, friends)?: Good participation with social networks  Intimate Partner Violence: Not At Risk (02/16/2024)   Humiliation, Afraid, Rape, and Kick questionnaire    Fear of Current or Ex-Partner: No    Emotionally Abused: No    Physically Abused: No    Sexually Abused: No    Family History:   Family History  Problem Relation Age of Onset   Heart attack Mother     Heart disease Mother    Cancer Mother 55   Kidney failure Father    Colon cancer Sister      ROS:  Please see the history of present illness.   All other ROS reviewed and negative.   Her review of systems is compromised by decreased memory  Physical Exam/Data:   Vitals:   03/26/24 0615 03/26/24 0645 03/26/24 0700 03/26/24 0701  BP: 114/86 123/78 127/85   Pulse: (!) 113 (!) 115 (!) 114 98  Resp: 18 (!) 21 (!) 21 20  Temp:      TempSrc:      SpO2: 95% 99% 96% 99%  Weight:      Height:       No intake or output data in the 24 hours ending 03/26/24 0836 Filed Weights   03/25/24 1527  Weight: 81.6 kg   Body mass index is 30.88 kg/m.  GENERAL:  Wellappearing HEENT:   Pupils equal round and reactive, fundi not visualized, oral mucosa unremarkable NECK:  No  jugular venous distention, waveform within normal limits, carotid upstroke brisk and symmetric, no bruits, no thyromegaly LYMPHATICS:  No cervical, inguinal adenopathy LUNGS:   Clear to auscultation bilaterally BACK:  No CVA tenderness CHEST:   Unremarkable HEART:  PMI not displaced or sustained,S1 and S2 within normal limits, no S3, no clicks, no rubs,  murmurs, irregular ABD:  Flat, positive bowel sounds normal in frequency in pitch, no bruits, no rebound, no guarding, no midline pulsatile mass, no hepatomegaly, no splenomegaly EXT:  2 plus pulses throughout, no  edema, no cyanosis no clubbing SKIN:  No rashes no nodules NEURO:   Cranial nerves II through XII grossly intact, motor grossly intact throughout PSYCH:    Cognitively intact, oriented to person place and time   EKG:  The EKG was personally reviewed and demonstrates: 2-1 atypical flutter rate 126, right bundle branch block Telemetry:  Telemetry was personally reviewed and demonstrates: Atrial flutter with variable conduction  Relevant CV Studies: Echocardiogram 02/16/2024   1. Left ventricular ejection fraction, by estimation, is 60 to 65%. The  left  ventricle has normal function. The left ventricle has no regional  wall motion abnormalities. Left ventricular diastolic function could not  be evaluated.   2. Right ventricular systolic function is normal. The right ventricular  size is normal. There is normal pulmonary artery systolic pressure.   3. Left atrial size was mildly dilated.   4. The mitral valve is normal in structure. Trivial mitral valve  regurgitation.   5. The aortic valve is tricuspid. Aortic valve regurgitation is not  visualized. No aortic stenosis is present.   6. The inferior vena cava is dilated in size with <50% respiratory  variability, suggesting right atrial pressure of 15 mmHg.   Laboratory Data:  Chemistry Recent Labs  Lab 03/25/24 1533  NA 142  K 4.4  CL 109  CO2 22  GLUCOSE 97  BUN 17  CREATININE 1.23*  CALCIUM  9.1  GFRNONAA 45*  ANIONGAP 11    No results for input(s): PROT, ALBUMIN, AST, ALT, ALKPHOS, BILITOT in the last 168 hours. Hematology Recent Labs  Lab 03/25/24 1533  WBC 10.0  RBC 4.76  HGB 13.8  HCT 43.9  MCV 92.2  MCH 29.0  MCHC 31.4  RDW 14.8  PLT 283   Cardiac EnzymesNo results for input(s): TROPONINI in the last 168 hours. No results for input(s): TROPIPOC in the last 168 hours.  BNPNo results for input(s): BNP, PROBNP in the last 168 hours.  DDimer No results for input(s): DDIMER in the last 168 hours.  Radiology/Studies:  CT ANGIO HEAD NECK W WO CM Result Date: 03/26/2024 CLINICAL DATA:  Initial evaluation for acute neuro deficit, stroke. EXAM: CT ANGIOGRAPHY HEAD AND NECK WITH AND WITHOUT CONTRAST TECHNIQUE: Multidetector CT imaging of the head and neck was performed using the standard protocol during bolus administration of intravenous contrast. Multiplanar CT image reconstructions and MIPs were obtained to evaluate the vascular anatomy. Carotid stenosis measurements (when applicable) are obtained utilizing NASCET criteria, using the distal  internal carotid diameter as the denominator. RADIATION DOSE REDUCTION: This exam was performed according to the departmental dose-optimization program which includes automated exposure control, adjustment of the mA and/or kV according to patient size and/or use of iterative reconstruction technique. CONTRAST:  75mL OMNIPAQUE  IOHEXOL  350 MG/ML SOLN COMPARISON:  Prior study from 02/25/2021 FINDINGS: CT HEAD FINDINGS Brain: Cerebral volume within normal limits. Mild chronic microvascular ischemic disease. Remote lacunar infarct at the left basal ganglia. Encephalomalacia involving the left temporal occipital region, consistent with a chronic left MCA territory infarct. No acute intracranial hemorrhage. No acute large vessel territory infarct. No mass lesion or midline shift. No hydrocephalus or extra-axial fluid collection. Vascular: No abnormal hyperdense vessel. Calcified atherosclerosis present at the skull base. Skull: Scalp soft tissues within normal limits for age calvarium intact. Sinuses/Orbits: Globes and orbital soft tissues within normal limits. Paranasal sinuses and mastoid air cells are largely clear. Other: None. Review of the MIP images confirms the above findings CTA NECK FINDINGS Aortic arch: Visualized aortic arch within normal limits for caliber with standard 3 vessel morphology. Mild aortic atherosclerosis. No significant stenosis about the origin the great vessels. Right carotid system: Right common and internal carotid arteries are patent without stenosis or dissection. Left carotid system: Left common and internal carotid arteries are patent without stenosis or dissection. Vertebral arteries: Both vertebral arteries arise from subclavian arteries. Vertebral arteries are patent without stenosis or dissection. Skeleton: No worrisome osseous lesions.  Patient is edentulous. Other neck: No other acute finding. Upper chest: Emphysema. Spiculated pulmonary nodule at the medial left upper lobe measures  2.3 x 2.8 x 1.9 cm, concerning for a primary bronchogenic carcinoma (series 14, image 22). Review of the MIP images confirms the above findings CTA HEAD FINDINGS Anterior  circulation: Mild atheromatous change about the carotid siphons without hemodynamically significant stenosis. A1 segments patent bilaterally. Left A1 dominant. Normal anterior communicating artery complex. Anterior cerebral arteries are patent without stenosis. No M1 stenosis or occlusion. No proximal MCA branch occlusion or high-grade stenosis. Distal MCA branches perfused and symmetric. Posterior circulation: Both V4 segments patent without significant stenosis. Left vertebral artery slightly dominant. Both PICA patent at their origins. Basilar patent without stenosis. Superior cerebellar and posterior cerebral arteries patent bilaterally. Venous sinuses: Patent allowing for timing the contrast bolus. Anatomic variants: As above.  No aneurysm. Review of the MIP images confirms the above findings IMPRESSION: CT HEAD: 1. No acute intracranial abnormality. 2. Chronic left MCA territory infarct. 3. Underlying mild chronic microvascular ischemic disease. CTA HEAD AND NECK: 1. Negative CTA for large vessel occlusion or other emergent finding. 2. Mild atheromatous change about the carotid siphons without hemodynamically significant stenosis. 3. 2.3 x 2.8 x 1.9 cm spiculated pulmonary nodule at the medial left upper lobe, concerning for a primary bronchogenic carcinoma. Aortic Atherosclerosis (ICD10-I70.0) and Emphysema (ICD10-J43.9). Electronically Signed   By: Morene Hoard M.D.   On: 03/26/2024 01:09   DG Chest 2 View Result Date: 03/25/2024 CLINICAL DATA:  Dizziness and tachycardia. EXAM: CHEST - 2 VIEW COMPARISON:  February 15, 2024 FINDINGS: The cardiac silhouette is borderline in size and stable in appearance. Moderate severity calcification of the aortic arch is seen. There is very mild left infrahilar linear scarring and/or atelectasis.  No acute infiltrate, pleural effusion or pneumothorax is identified. The visualized skeletal structures are unremarkable. IMPRESSION: No acute cardiopulmonary disease. Electronically Signed   By: Suzen Dials M.D.   On: 03/25/2024 16:28    Assessment and Plan:   Persistent atrial fibrillation: She has had previous persistent atrial fibrillation requiring cardioversions.  She been maintained on flecainide and has been seen but it has been decided by EP not to do ablation as she seems to be maintaining sinus rhythm on flecainide.  She is tolerating anticoagulation.  As she appears to be in flutter.  Is not clear that she has been compliant with meds which makes cardioversion this admission complex.  We will try for rate control and then outpatient reconsideration of possible ablation of both A-fib and flutter.  Her rate was controlled with the IV Cardizem  and this is now stopped.  I am going to write for p.o. meds.  I will resume her previous dose of p.o. Cardizem .   Anticoagulation will be addressed as below.  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    Chronic heart failure with preserved ejection fraction:  She seems to be euvolemic.  No change in therapy.  Dyslipidemia: She can continue home meds.  Left upper lobe nodule: The patient will need to come off anticoagulation would be reasonable to do this if she decides to go forward with biopsy.  This is being discussed with pulmonary.  Pending that decision her Eliquis  is being held and heparin  started.   For questions or updates, please contact [facility] Please consult [email or web address] for contact info under Cardiology/STEMI.   Signed, Lynwood Schilling, MD  03/26/2024 8:36 AM

## 2024-03-26 NOTE — Consult Note (Signed)
 NAME:  Michelle Dean, MRN:  [id number], DOB:  [birth year], LOS: 0 ADMISSION DATE:  03/25/2024, CONSULTATION DATE:  03/26/24 REFERRING MD:  EDP CHIEF COMPLAINT:  Lung Nodule   History of Present Illness:  Michelle Dean is a 78 year old woman with history of HFpEF, paroxysmal atrial fibrillation on eliquis  and stroke who is being admitted for atrial fibrillation with RVR.   PCCM consulted for left upper lobe nodule that has grown in size to 2.3x2.8x1.9cm from 1.4 x 1.7cm last May. Last year she had bronchosopy with biopsy performed after PET CT scan showed suv of 6.2. The biopsy results did not show cancer. Patient refused a repeat biopsy last year.   Reviewed imaging results with patient and family from the head/neck scan. We discussed the potential for navigational bronchoscopy. She said she would speak more with her son. She has an 88 pack year smoking history.  Pertinent  Medical History   Past Medical History:  Diagnosis Date   (HFpEF) heart failure with preserved ejection fraction (HCC)    Hyperlipidemia    Hypertension    Paroxysmal atrial fibrillation (HCC)    RBBB    Stroke (HCC)      Significant Hospital Events: Including procedures, antibiotic start and stop dates in addition to other pertinent events     Interim History / Subjective:  As above  Objective    Blood pressure (!) 144/107, pulse (!) 116, temperature 97.7 F (36.5 C), temperature source Oral, resp. rate 16, height 5' 4 (1.626 m), weight 81.6 kg, SpO2 100%.       No intake or output data in the 24 hours ending 03/26/24 0322 Filed Weights   03/25/24 1527  Weight: 81.6 kg    Examination: General: elderly woman, no distrss HENT: [place]/AT moist mucous membranes Lungs: Clear to auscultation, no wheezing Cardiovascular: irrigularly irregular Abdomen: soft, non-tender, non-distended Extremities: warm, dry Neuro: alert, oriented, moving all extremities GU: n/a  Resolved problem list   Assessment and  Plan   Left Upper Lobe Nodule, 2.3 x 2.8 - reviewed the growth of the nodule which can be concerning for cancer along with its history of being pet avid.  - discussed possibility of performing navigational bronchoscopy based findings   - patient wishes to speak with her son and will let us  know - she will need to hold eliquis  2 days before procedure if she decides to move forward with procedure.  Please call pccm if she would like to be setup fro bronchoscopy.  Labs   CBC: Recent Labs  Lab 03/25/24 1533  WBC 10.0  HGB 13.8  HCT 43.9  MCV 92.2  PLT 283    Basic Metabolic Panel: Recent Labs  Lab 03/25/24 1533  NA 142  K 4.4  CL 109  CO2 22  GLUCOSE 97  BUN 17  CREATININE 1.23*  CALCIUM  9.1   GFR: Estimated Creatinine Clearance: 38.3 mL/min (A) (by C-G formula based on SCr of 1.23 mg/dL (H)). Recent Labs  Lab 03/25/24 1533  WBC 10.0    Liver Function Tests: No results for input(s): AST, ALT, ALKPHOS, BILITOT, PROT, ALBUMIN in the last 168 hours. No results for input(s): LIPASE, AMYLASE in the last 168 hours. No results for input(s): AMMONIA in the last 168 hours.  ABG No results found for: PHART, PCO2ART, PO2ART, HCO3, TCO2, ACIDBASEDEF, O2SAT   Coagulation Profile: No results for input(s): INR, PROTIME in the last 168 hours.  Cardiac Enzymes: No results for input(s): CKTOTAL, CKMB, CKMBINDEX, TROPONINI  in the last 168 hours.  HbA1C: Hgb A1c MFr Bld  Date/Time Value Ref Range Status  02/26/2021 05:25 AM 5.5 4.8 - 5.6 % Final    Comment:    (NOTE) Pre diabetes:          5.7%-6.4%  Diabetes:              >6.4%  Glycemic control for   <7.0% adults with diabetes     CBG: No results for input(s): GLUCAP in the last 168 hours.  Review of Systems:   Review of Systems  Constitutional:  Negative for chills, fever, malaise/fatigue and weight loss.  HENT:  Negative for congestion, sinus pain and sore throat.    Eyes: Negative.   Respiratory:  Negative for cough, hemoptysis, sputum production, shortness of breath and wheezing.   Cardiovascular:  Negative for chest pain, palpitations, orthopnea, claudication and leg swelling.  Gastrointestinal:  Negative for abdominal pain, heartburn, nausea and vomiting.  Genitourinary: Negative.   Musculoskeletal:  Negative for joint pain and myalgias.  Skin:  Negative for rash.  Neurological:  Negative for weakness.  Endo/Heme/Allergies: Negative.   Psychiatric/Behavioral: Negative.       Past Medical History:  She,  has a past medical history of (HFpEF) heart failure with preserved ejection fraction (HCC), Hyperlipidemia, Hypertension, Paroxysmal atrial fibrillation (HCC), RBBB, and Stroke (HCC).   Surgical History:   Past Surgical History:  Procedure Laterality Date   BUBBLE STUDY  03/01/2021   Procedure: BUBBLE STUDY;  Surgeon: Michele Richardson, DO;  Location: [facility] ENDOS[facility];  Service: Cardiovascular;;   CARDIOVERSION N/A 03/01/2021   Procedure: CARDIOVERSION;  Surgeon: Michele Richardson, DO;  Location: [facility] ENDOSCOPY;  Service: Cardiovascular;  Laterality: N/A;   CARDIOVERSION N/A 03/05/2021   Procedure: CARDIOVERSION;  Surgeon: Ladona Heinz, MD;  Location: [facility] ENDOSCOPY;  Service: Cardiovascular;  Laterality: N/A;   CARDIOVERSION N/A 02/22/2024   Procedure: CARDIOVERSION;  Surgeon: Francyne Headland, MD;  Location: [facility] INVASIVE CV LAB;  Service: Cardiovascular;  Laterality: N/A;   IR CT HEAD LTD  02/25/2021   IR PERCUTANEOUS ART THROMBECTOMY/INFUSION INTRACRANIAL INC DIAG ANGIO  02/25/2021   RADIOLOGY WITH ANESTHESIA N/A 02/25/2021   Procedure: IR WITH ANESTHESIA;  Surgeon: Radiologist, Medication, MD;  Location: [facility] OR;  Service: Radiology;  Laterality: N/A;   TEE WITHOUT CARDIOVERSION N/A 03/01/2021   Procedure: TRANSESOPHAGEAL ECHOCARDIOGRAM (TEE);  Surgeon: Michele Richardson, DO;  Location: [facility] ENDOSCOPY;  Service: Cardiovascular;  Laterality: N/A;     Social History:    reports that she quit smoking about 25 years ago. Her smoking use included cigarettes. She started smoking about 52 years ago. She has a 27 pack-year smoking history. She has never been exposed to tobacco smoke. She has never used smokeless tobacco. She reports that she does not drink alcohol and does not use drugs.   Family History:  Her family history includes Cancer (age of onset: 42) in her mother; Colon cancer in her sister; Heart attack in her mother; Heart disease in her mother; Kidney failure in her father.   Allergies Allergies  Allergen Reactions   Aspirin      Other reaction(s): Bleeding   Penicillins Hives and Rash     Home Medications  Prior to Admission medications   Medication Sig Start Date End Date Taking? Authorizing Provider  acetaminophen  (TYLENOL ) 500 MG tablet Take 500 mg by mouth every 6 (six) hours as needed for moderate pain (pain score 4-6).    [provider]  albuterol  (PROVENTIL ) (2.5 MG/3ML) 0.083% nebulizer  solution Take 2.5 mg by nebulization every 4 (four) hours as needed for wheezing. 02/16/24   [provider]  atorvastatin  (LIPITOR) 10 MG tablet Take 1 tablet (10 mg total) by mouth daily. 03/18/21   Angiulli, Toribio PARAS, PA-C  carboxymethylcellulose (REFRESH PLUS) 0.5 % SOLN Place 1 drop into both eyes 4 (four) times daily as needed (dry eyes).    [provider]  cholecalciferol (VITAMIN D) 25 MCG (1000 UNIT) tablet Take 2,000 Units by mouth daily.    [provider]  diltiazem  (CARDIZEM  CD) 120 MG 24 hr capsule Take 1 capsule (120 mg total) by mouth every evening. 02/22/24   Drusilla Sabas RAMAN, MD  ELIQUIS  5 MG TABS tablet TAKE ONE TABLET TWICE A DAY. 10/08/23   Tolia, Sunit, DO  empagliflozin  (JARDIANCE ) 10 MG TABS tablet Take 1 tablet (10 mg total) by mouth daily. Patient not taking: Reported on 03/22/2024 08/27/23   Tolia, Sunit, DO  flecainide (TAMBOCOR) 50 MG tablet Take 1 tablet (50 mg total) by mouth every 12 (twelve)  hours. 02/22/24   Drusilla Sabas RAMAN, MD  magnesium  oxide (MAG-OX) 400 (240 Mg) MG tablet Take 1 tablet by mouth daily. 03/11/24   [provider]  metoprolol  succinate (TOPROL  XL) 25 MG 24 hr tablet Take 0.5 tablets (12.5 mg total) by mouth at bedtime. 02/22/24 06/21/24  Drusilla Sabas RAMAN, MD  mometasone -formoterol  (DULERA ) 100-5 MCG/ACT AERO Inhale 2 puffs into the lungs 2 (two) times daily. 03/18/21   Angiulli, Toribio PARAS, PA-C  Multiple Vitamins-Minerals (CERTAVITE/ANTIOXIDANTS) TABS Take 1 tablet by mouth daily. 03/08/21   de Clint Kill, Cortney E, NP  omeprazole  (PRILOSEC) 20 MG capsule Take 1 capsule (20 mg total) by mouth daily. 03/18/21   Angiulli, Daniel J, PA-C  Psyllium (DAILY FIBER PO) Take 160 mg by mouth daily.    [provider]  senna-docusate (SENOKOT-S) 8.6-50 MG tablet Take 2 tablets by mouth 2 (two) times daily. 03/18/21   Angiulli, Toribio PARAS, PA-C  umeclidinium bromide  (INCRUSE ELLIPTA ) 62.5 MCG/ACT AEPB Inhale 1 puff into the lungs daily. 04/09/21   [provider]     Critical care time:     Dorn Chill, MD [facility] Office: [phone]   See Amion for personal pager PCCM on call pager [phone] until 7pm. Please call Elink 7p-7a. [phone]

## 2024-03-26 NOTE — ED Notes (Signed)
Pt. Ambulated to restroom w/ steady gait.

## 2024-03-26 NOTE — Assessment & Plan Note (Signed)
 Noted left upper lobe nodule that is effectively doubled from recent imaging Nodule has grown in size to 2.3x2.8x1.9cm from 1.4 x 1.7cm  since May 2025 S/p prior bronchoscopy with biopsy performed after PET CT- biopsy noncancerous  Dr. Kara with pulmonology consulted  Potential bronchoscopy during this admission pending discussion with patient and son  Follow up recommendations

## 2024-03-27 ENCOUNTER — Observation Stay (HOSPITAL_COMMUNITY)

## 2024-03-27 DIAGNOSIS — I4892 Unspecified atrial flutter: Secondary | ICD-10-CM | POA: Diagnosis not present

## 2024-03-27 DIAGNOSIS — I483 Typical atrial flutter: Principal | ICD-10-CM

## 2024-03-27 DIAGNOSIS — I509 Heart failure, unspecified: Secondary | ICD-10-CM | POA: Diagnosis not present

## 2024-03-27 DIAGNOSIS — I4819 Other persistent atrial fibrillation: Secondary | ICD-10-CM | POA: Diagnosis not present

## 2024-03-27 LAB — APTT
aPTT: 52 s — ABNORMAL HIGH (ref 24–36)
aPTT: 57 s — ABNORMAL HIGH (ref 24–36)
aPTT: 72 s — ABNORMAL HIGH (ref 24–36)

## 2024-03-27 LAB — HEPARIN LEVEL (UNFRACTIONATED): Heparin Unfractionated: >1.1 [IU]/mL — ABNORMAL HIGH (ref 0.30–0.70)

## 2024-03-27 LAB — CBC
HCT: 43.1 % (ref 36.0–46.0)
Hemoglobin: 13.2 g/dL (ref 12.0–15.0)
MCH: 29.1 pg (ref 26.0–34.0)
MCHC: 30.6 g/dL (ref 30.0–36.0)
MCV: 95.1 fL (ref 80.0–100.0)
Platelets: 227 K/uL (ref 150–400)
RBC: 4.53 MIL/uL (ref 3.87–5.11)
RDW: 15.2 % (ref 11.5–15.5)
WBC: 8.2 K/uL (ref 4.0–10.5)
nRBC: 0 % (ref 0.0–0.2)

## 2024-03-27 LAB — TSH: TSH: 1.179 u[IU]/mL (ref 0.350–4.500)

## 2024-03-27 MED ORDER — ATORVASTATIN CALCIUM 10 MG PO TABS
10.0000 mg | ORAL_TABLET | Freq: Every day | ORAL | Status: DC
  Administered 2024-03-27 – 2024-03-28 (×2): 10 mg via ORAL
  Filled 2024-03-27 (×2): qty 1

## 2024-03-27 MED ORDER — FLECAINIDE ACETATE 50 MG PO TABS
50.0000 mg | ORAL_TABLET | Freq: Two times a day (BID) | ORAL | Status: DC
  Administered 2024-03-27 – 2024-03-28 (×2): 50 mg via ORAL
  Filled 2024-03-27 (×2): qty 1

## 2024-03-27 MED ORDER — METOPROLOL SUCCINATE ER 25 MG PO TB24
12.5000 mg | ORAL_TABLET | Freq: Every day | ORAL | Status: DC
  Administered 2024-03-27 – 2024-03-28 (×2): 12.5 mg via ORAL
  Filled 2024-03-27 (×2): qty 1

## 2024-03-27 MED ORDER — DILTIAZEM HCL ER COATED BEADS 120 MG PO CP24
120.0000 mg | ORAL_CAPSULE | Freq: Every day | ORAL | Status: DC
  Administered 2024-03-27 – 2024-03-28 (×2): 120 mg via ORAL
  Filled 2024-03-27 (×2): qty 1

## 2024-03-27 NOTE — Progress Notes (Signed)
 PHARMACY - ANTICOAGULATION  Pharmacy Consult for heparin   Indication: atrial fibrillation Brief A/P: aPTT subtherapeutic Increase Heparin  rate   Allergies  Allergen Reactions   Aspirin      Other reaction(s): Bleeding   Penicillins Hives and Rash    Patient Measurements: Height: 5' 4 (162.6 cm) Weight: 81.6 kg (179 lb 14.3 oz) IBW/kg (Calculated) : 54.7 HEPARIN  DW (KG): 72.3  Vital Signs: Temp: 98 F (36.7 C) (11/15 2334) Temp Source: Oral (11/15 2334) BP: 101/62 (11/15 2334) Pulse Rate: 46 (11/15 2334)  Labs: Recent Labs    03/25/24 1533 03/25/24 1746 03/27/24 0014  HGB 13.8  --   --   HCT 43.9  --   --   PLT 283  --   --   APTT  --   --  52*  HEPARINUNFRC  --   --  >1.10*  CREATININE 1.23*  --   --   TROPONINIHS 12 13  --     Estimated Creatinine Clearance: 38.3 mL/min (A) (by C-G formula based on SCr of 1.23 mg/dL (H)).   Assessment: 78 y.o. female with h/o Afib, Eliquis  on hold, for heparin   Goal of Therapy:  Heparin  level 0.3-0.7 units/ml aPTT 66-102 seconds Monitor platelets by anticoagulation protocol: Yes   Plan:  Increase Heparin  1150 units/hr Check aPTT in 8 hours  Cathlyn Arrant, PharmD, BCPS  Salia Cangemi, Cordella Misty 03/27/2024,12:39 AM

## 2024-03-27 NOTE — Progress Notes (Signed)
 Progress Note  Patient Name: Michelle Dean Date of Encounter: 03/27/2024  Primary Cardiologist:   Madonna Large, DO   Subjective   She feels great. No pain. No SOB.  No palpitations.   Inpatient Medications    Scheduled Meds:  Continuous Infusions:  heparin  1,150 Units/hr (03/27/24 0544)   PRN Meds: acetaminophen  **OR** acetaminophen    Vital Signs    Vitals:   03/26/24 2334 03/27/24 0452 03/27/24 0807 03/27/24 1150  BP: 101/62 (!) 110/53 113/62 120/75  Pulse: (!) 46 61 67   Resp:  19 16 18   Temp: 98 F (36.7 C) 97.9 F (36.6 C) 98.4 F (36.9 C) 98.4 F (36.9 C)  TempSrc: Oral Oral Oral Oral  SpO2: 97% 97% 97%   Weight:  83.4 kg    Height:        Intake/Output Summary (Last 24 hours) at 03/27/2024 1154 Last data filed at 03/27/2024 0544 Gross per 24 hour  Intake 729.17 ml  Output --  Net 729.17 ml   Filed Weights   03/25/24 1527 03/27/24 0452  Weight: 81.6 kg 83.4 kg    Telemetry    NSR - Personally Reviewed  ECG    NA - Personally Reviewed  Physical Exam   GEN: No acute distress.   Neck: No  JVD Cardiac: RRR, no murmurs, rubs, or gallops.  Respiratory: Clear  to auscultation bilaterally. GI: Soft, nontender, non-distended  MS: No  edema; No deformity. Neuro:  Nonfocal  Psych: Normal affect   Labs    Chemistry Recent Labs  Lab 03/25/24 1533  NA 142  K 4.4  CL 109  CO2 22  GLUCOSE 97  BUN 17  CREATININE 1.23*  CALCIUM  9.1  GFRNONAA 45*  ANIONGAP 11     Hematology Recent Labs  Lab 03/25/24 1533 03/27/24 0807  WBC 10.0 8.2  RBC 4.76 4.53  HGB 13.8 13.2  HCT 43.9 43.1  MCV 92.2 95.1  MCH 29.0 29.1  MCHC 31.4 30.6  RDW 14.8 15.2  PLT 283 227    Cardiac EnzymesNo results for input(s): TROPONINI in the last 168 hours. No results for input(s): TROPIPOC in the last 168 hours.   BNPNo results for input(s): BNP, PROBNP in the last 168 hours.   DDimer No results for input(s): DDIMER in the last 168 hours.    Radiology    CT ANGIO HEAD NECK W WO CM Result Date: 03/26/2024 CLINICAL DATA:  Initial evaluation for acute neuro deficit, stroke. EXAM: CT ANGIOGRAPHY HEAD AND NECK WITH AND WITHOUT CONTRAST TECHNIQUE: Multidetector CT imaging of the head and neck was performed using the standard protocol during bolus administration of intravenous contrast. Multiplanar CT image reconstructions and MIPs were obtained to evaluate the vascular anatomy. Carotid stenosis measurements (when applicable) are obtained utilizing NASCET criteria, using the distal internal carotid diameter as the denominator. RADIATION DOSE REDUCTION: This exam was performed according to the departmental dose-optimization program which includes automated exposure control, adjustment of the mA and/or kV according to patient size and/or use of iterative reconstruction technique. CONTRAST:  75mL OMNIPAQUE  IOHEXOL  350 MG/ML SOLN COMPARISON:  Prior study from 02/25/2021 FINDINGS: CT HEAD FINDINGS Brain: Cerebral volume within normal limits. Mild chronic microvascular ischemic disease. Remote lacunar infarct at the left basal ganglia. Encephalomalacia involving the left temporal occipital region, consistent with a chronic left MCA territory infarct. No acute intracranial hemorrhage. No acute large vessel territory infarct. No mass lesion or midline shift. No hydrocephalus or extra-axial fluid collection. Vascular: No abnormal  hyperdense vessel. Calcified atherosclerosis present at the skull base. Skull: Scalp soft tissues within normal limits for age calvarium intact. Sinuses/Orbits: Globes and orbital soft tissues within normal limits. Paranasal sinuses and mastoid air cells are largely clear. Other: None. Review of the MIP images confirms the above findings CTA NECK FINDINGS Aortic arch: Visualized aortic arch within normal limits for caliber with standard 3 vessel morphology. Mild aortic atherosclerosis. No significant stenosis about the origin the great  vessels. Right carotid system: Right common and internal carotid arteries are patent without stenosis or dissection. Left carotid system: Left common and internal carotid arteries are patent without stenosis or dissection. Vertebral arteries: Both vertebral arteries arise from subclavian arteries. Vertebral arteries are patent without stenosis or dissection. Skeleton: No worrisome osseous lesions.  Patient is edentulous. Other neck: No other acute finding. Upper chest: Emphysema. Spiculated pulmonary nodule at the medial left upper lobe measures 2.3 x 2.8 x 1.9 cm, concerning for a primary bronchogenic carcinoma (series 14, image 22). Review of the MIP images confirms the above findings CTA HEAD FINDINGS Anterior circulation: Mild atheromatous change about the carotid siphons without hemodynamically significant stenosis. A1 segments patent bilaterally. Left A1 dominant. Normal anterior communicating artery complex. Anterior cerebral arteries are patent without stenosis. No M1 stenosis or occlusion. No proximal MCA branch occlusion or high-grade stenosis. Distal MCA branches perfused and symmetric. Posterior circulation: Both V4 segments patent without significant stenosis. Left vertebral artery slightly dominant. Both PICA patent at their origins. Basilar patent without stenosis. Superior cerebellar and posterior cerebral arteries patent bilaterally. Venous sinuses: Patent allowing for timing the contrast bolus. Anatomic variants: As above.  No aneurysm. Review of the MIP images confirms the above findings IMPRESSION: CT HEAD: 1. No acute intracranial abnormality. 2. Chronic left MCA territory infarct. 3. Underlying mild chronic microvascular ischemic disease. CTA HEAD AND NECK: 1. Negative CTA for large vessel occlusion or other emergent finding. 2. Mild atheromatous change about the carotid siphons without hemodynamically significant stenosis. 3. 2.3 x 2.8 x 1.9 cm spiculated pulmonary nodule at the medial left  upper lobe, concerning for a primary bronchogenic carcinoma. Aortic Atherosclerosis (ICD10-I70.0) and Emphysema (ICD10-J43.9). Electronically Signed   By: Morene Hoard M.D.   On: 03/26/2024 01:09   DG Chest 2 View Result Date: 03/25/2024 CLINICAL DATA:  Dizziness and tachycardia. EXAM: CHEST - 2 VIEW COMPARISON:  February 15, 2024 FINDINGS: The cardiac silhouette is borderline in size and stable in appearance. Moderate severity calcification of the aortic arch is seen. There is very mild left infrahilar linear scarring and/or atelectasis. No acute infiltrate, pleural effusion or pneumothorax is identified. The visualized skeletal structures are unremarkable. IMPRESSION: No acute cardiopulmonary disease. Electronically Signed   By: Suzen Dials M.D.   On: 03/25/2024 16:28    Cardiac Studies   NA  Patient Profile     78 y.o. female  with a hx of fibrillation who is being seen today for the evaluation of atrial flutter at the request of Dr. Eldonna.   Assessment & Plan    Persistent atrial fibrillation: She has had previous persistent atrial fibrillation requiring cardioversions.  She been maintained on flecainide and has been seen but it has been decided by EP not to do ablation as she seemed to be maintaining sinus rhythm on flecainide. Flecainide level is in process and we might be able to consider increasing this dose.  She is tolerating anticoagulation.  However, there is some question about whether she is taking this as prescribed.  She  was currently in flutter.    Thankfully she self converted with a brief episode of self termination bradycardia.  She had no symptoms.   No change in therapy.   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   This makes obtaining a history difficult    Chronic heart failure with preserved ejection fraction:  She seems to be euvolemic.  No change in therapy.    Dyslipidemia:  Continue previous meds.     Left upper lobe nodule: The patient will need to come off anticoagulation would be reasonable to do this if she decides to go forward with biopsy.  Will await further discussion between the primary  team and pulmonary.     For questions or updates, please contact [facility] Please consult [email or web address] for contact info under Cardiology/STEMI.   Signed, Lynwood Schilling, MD  03/27/2024, 11:54 AM

## 2024-03-27 NOTE — Progress Notes (Signed)
 CCMD called and Pt's HR went down into 20's and then she had a 2.9 second pause. Cardizem  IV was stopped. Patient appeared to have gone back into SR or SB after the pause. An EKG was done and Dr. Charlton and Dr. Cesario were paged to let them know. Dr. Charlton discontinued the Cardizem  IV order. When last checked patient was in sinus brady. Will continue to monitor.

## 2024-03-27 NOTE — Progress Notes (Addendum)
 PHARMACY - ANTICOAGULATION  Pharmacy Consult for heparin   Indication: atrial fibrillation Brief A/P: aPTT subtherapeutic Increase Heparin  rate   Allergies  Allergen Reactions   Aspirin      Other reaction(s): Bleeding   Penicillins Hives and Rash    Patient Measurements: Height: 5' 4 (162.6 cm) Weight: 83.4 kg (183 lb 13.8 oz) IBW/kg (Calculated) : 54.7 HEPARIN  DW (KG): 72.3  Vital Signs: Temp: 98.4 F (36.9 C) (11/16 0807) Temp Source: Oral (11/16 0807) BP: 113/62 (11/16 0807) Pulse Rate: 67 (11/16 0807)  Labs: Recent Labs    03/25/24 1533 03/25/24 1746 03/27/24 0014 03/27/24 0807  HGB 13.8  --   --  13.2  HCT 43.9  --   --  43.1  PLT 283  --   --  227  APTT  --   --  52* 72*  HEPARINUNFRC  --   --  >1.10*  --   CREATININE 1.23*  --   --   --   TROPONINIHS 12 13  --   --     Estimated Creatinine Clearance: 38.8 mL/min (A) (by C-G formula based on SCr of 1.23 mg/dL (H)).   Assessment: 78 y.o. female with h/o Afib, PTA Eliquis  on hold for potential bronchoscopy this admission.   Overnight heparin  level elevated in setting of recent DOAC administration.  AM aPTT therapeutic at 72 on infusion of 1150 units/h. Hgb and plt WNL. No issues with heparin  infusion of s/sx bleeding reported.   Addendum: confirmatory aPTT just slightly subtherapeutic at 57 on infusion of 1150 units/h.   Goal of Therapy:  Heparin  level 0.3-0.7 units/ml aPTT 66-102 seconds Monitor platelets by anticoagulation protocol: Yes   Plan:  Increase Heparin  slightly to 1200 units/hr Next heparin  level tomorrow with AM labs Daily CBC, heparin  level, aPTT while on heparin   Maurilio Patten, PharmD PGY1 Pharmacy Resident [facility] 03/27/2024 9:03 AM

## 2024-03-27 NOTE — Plan of Care (Signed)
   Problem: Education: Goal: Knowledge of General Education information will improve Description: Including pain rating scale, medication(s)/side effects and non-pharmacologic comfort measures Outcome: Progressing   Problem: Activity: Goal: Risk for activity intolerance will decrease Outcome: Progressing   Problem: Nutrition: Goal: Adequate nutrition will be maintained Outcome: Progressing   Problem: Coping: Goal: Level of anxiety will decrease Outcome: Progressing

## 2024-03-27 NOTE — Progress Notes (Signed)
 An MRI of the brain was ordered at 0113. I went to ask the patient if she was claustrophobic and if she was ok with getting the MRI tonight. Patient said she did not want to do MRI until tomorrow as she was tired. I tried to talk the patient into doing it now so it would be over with but patient said no. I informed the MRI tech that patient would not go tonight. I will pass on the information to day shift that it needs to be done.

## 2024-03-27 NOTE — Progress Notes (Addendum)
 PROGRESS NOTE    [name]  FMW:[id number] DOB: 11-03-1945 DOA: 03/25/2024 PCP: Bonney Lynwood BRAVO, MD  No chief complaint on file.   Brief Narrative:   Michelle Dean is Michelle Dean 78 y.o. female with medical history significant of hypertension, hyperlipidemia, CKD 3A, history of CVA, PAF on Eliquis , and chronic HFpEF presenting with Ewan Grau-fib with RVR.  Limited history in the setting of patient being overall poor historian.  Per report, patient with high heart rate at home.  Patient reports?  Palpitations.  No chest pain or shortness of breath.  No nausea or vomiting.  Patient noted to have been admitted October 6 through October 13 for issues including Yu Peggs-fib with RVR.  Was initially started on IV diltiazem  during that time and transitioned to oral metoprolol  and Cardizem .  Had cardioversion done.  Was discharged on course of Cardizem , metoprolol  as well as flecainide.  At present, patient unclear of what her medication regimen is.  Does live at home alone.  Does report independent ADLs.  No belly pain or diarrhea.  No focal hemiparesis.  Positive mild generalized confusion. Presented to the ER afebrile, heart rate into the 120s, blood pressure 90s to 140s over 70s to 100s.  Satting well on room air.  White count 10, hemoglobin 13.8, platelets 283.  Urinalysis minimally indicative of infection.  Troponin negative x 2.  Creatinine 1.23 with GFR in the 40s.  Started on diltiazem  drip in the ER.  Heart rate now in the 80s.  CT of the head and neck negative for any acute LVO.  Noted spiculated nodule in the left upper lobe with recent evaluation for similar in May of last year. + Positive concern for bronchogenic carcinoma. Review of Systems: As mentioned in the history of present illness. All other systems reviewed and are negative.  Assessment & Plan:   Principal Problem:   Atrial flutter with rapid ventricular response (HCC) Active Problems:   Paroxysmal atrial fibrillation (HCC)   Aphasia   Mixed  hyperlipidemia   CKD stage 3a, GFR 45-59 ml/min (HCC)   Chronic diastolic CHF (congestive heart failure) (HCC)   History of stroke   COPD (chronic obstructive pulmonary disease) (HCC)   Pulmonary nodules  Paroxysmal atrial fibrillation (HCC) Rapid Ventricular Rate Presenting with [name]-fib with RVR with heart rate into the 120s requiring diltiazem  drip on presentation Noted admission roughly 1 month ago for similar presentation requiring cardioversion and triple therapy with metoprolol , Cardizem  and flecainide Some concern for medication compliance at home with suspected undiagnosed dementia Now back on flecainide, diltiazem , metoprolol  - dilt gtt d/c'd Now on heparin  gtt while discussing below Follow-up cardiology recommendations   Pulmonary nodules Noted left upper lobe nodule that is effectively doubled from recent imaging Nodule has grown in size to 2.3x2.8x1.9cm from 1.4 x 1.7cm  since May 2025 S/p prior bronchoscopy with biopsy performed after PET CT- biopsy noncancerous  Dr. Kara with pulmonology consulted - she was planning to speak with son, will review in AM - currently on heparin  gtt - possible bronch   COPD (chronic obstructive pulmonary disease) (HCC) Stable from Addelynn Batte respiratory standpoint Continue home inhalers   History of stroke History of Aphasia Positive remote history of CVA Has had some memory and word finding issues of undetermined duration CT of the head negative for any LVO or acute findings MRI without acute findings Otherwise continue regimen including statin and Eliquis  (heparin  gtt)   Chronic diastolic CHF (congestive heart failure) (HCC) 2D echo October 2025 with EF of  60 to 65% Valvular function otherwise stable apart from aortic stenosis Appears euvolemic Weight today 81.6 kg Monitor volume status   CKD stage 3a, GFR 45-59 ml/min (HCC) Cr 1.2 w/ GFR in the 40s  Looks to be near baseline    Mixed hyperlipidemia Continue lipitor    Cognitive  Deficit Delirium precautions MRI negative for acute findings Baseline transient word-finding difficulty mild confusion on presentation Unclear if this is an acute versus subacute issue MRI negative for acute stroke Follow outpatient  Obesity Body mass index is 31.56 kg/m.    DVT prophylaxis: heparin  gtt Code Status: full Family Communication: none Disposition:   Status is: Observation The patient remains OBS appropriate and will d/c before 2 midnights.   Consultants:  Cards pulm  Procedures:  none  Antimicrobials:  Anti-infectives (From admission, onward)    None       Subjective: No complaints  Objective: Vitals:   03/27/24 0452 03/27/24 0807 03/27/24 1150 03/27/24 1453  BP: (!) 110/53 113/62 120/75 136/78  Pulse: 61 67  81  Resp: 19 16 18    Temp: 97.9 F (36.6 C) 98.4 F (36.9 C) 98.4 F (36.9 C)   TempSrc: Oral Oral Oral   SpO2: 97% 97%  98%  Weight: 83.4 kg     Height:        Intake/Output Summary (Last 24 hours) at 03/27/2024 1905 Last data filed at 03/27/2024 1300 Gross per 24 hour  Intake 1029.17 ml  Output --  Net 1029.17 ml   Filed Weights   03/25/24 1527 03/27/24 0452  Weight: 81.6 kg 83.4 kg    Examination:  General exam: Appears calm and comfortable - eating dinner Respiratory system: unlabored Cardiovascular system: RRR Gastrointestinal system: Abdomen is nondistended, soft and nontender.  Central nervous system: Alert and oriented. No focal neurological deficits. Extremities: no LEE   Data Reviewed: I have personally reviewed following labs and imaging studies  CBC: Recent Labs  Lab 03/25/24 1533 03/27/24 0807  WBC 10.0 8.2  HGB 13.8 13.2  HCT 43.9 43.1  MCV 92.2 95.1  PLT 283 227    Basic Metabolic Panel: Recent Labs  Lab 03/25/24 1533  NA 142  K 4.4  CL 109  CO2 22  GLUCOSE 97  BUN 17  CREATININE 1.23*  CALCIUM  9.1    GFR: Estimated Creatinine Clearance: 38.8 mL/min (Tait Balistreri) (by C-G formula based on  SCr of 1.23 mg/dL (H)).  Liver Function Tests: No results for input(s): AST, ALT, ALKPHOS, BILITOT, PROT, ALBUMIN in the last 168 hours.  CBG: No results for input(s): GLUCAP in the last 168 hours.   No results found for this or any previous visit (from the past 240 hours).       Radiology Studies: MR BRAIN WO CONTRAST Result Date: 03/27/2024 EXAM: MRI BRAIN WITHOUT CONTRAST 03/27/2024 02:01:00 PM TECHNIQUE: Multiplanar multisequence MRI of the head/brain was performed without the administration of intravenous contrast. COMPARISON: MR Head without IV contrast 02/26/2021. CLINICAL HISTORY: Mental status change. FINDINGS: BRAIN AND VENTRICLES: No acute infarct. Remote left posterior MCA territory infarct within encephalomalacia, gliosis and evidence of prior hemorrhage. No intracranial hemorrhage. No mass. No midline shift. No hydrocephalus. The sella is unremarkable. Normal flow voids. ORBITS: No acute abnormality. SINUSES AND MASTOIDS: No acute abnormality. BONES AND SOFT TISSUES: Normal marrow signal. No acute soft tissue abnormality. IMPRESSION: 1. No acute intracranial abnormality. 2. Remote left MCA territory infarct. Electronically signed by: Gilmore Molt MD 03/27/2024 03:16 PM EST RP Workstation: [id number]  CT ANGIO HEAD NECK W WO CM Result Date: 03/26/2024 CLINICAL DATA:  Initial evaluation for acute neuro deficit, stroke. EXAM: CT ANGIOGRAPHY HEAD AND NECK WITH AND WITHOUT CONTRAST TECHNIQUE: Multidetector CT imaging of the head and neck was performed using the standard protocol during bolus administration of intravenous contrast. Multiplanar CT image reconstructions and MIPs were obtained to evaluate the vascular anatomy. Carotid stenosis measurements (when applicable) are obtained utilizing NASCET criteria, using the distal internal carotid diameter as the denominator. RADIATION DOSE REDUCTION: This exam was performed according to the departmental dose-optimization  program which includes automated exposure control, adjustment of the mA and/or kV according to patient size and/or use of iterative reconstruction technique. CONTRAST:  75mL OMNIPAQUE  IOHEXOL  350 MG/ML SOLN COMPARISON:  Prior study from 02/25/2021 FINDINGS: CT HEAD FINDINGS Brain: Cerebral volume within normal limits. Mild chronic microvascular ischemic disease. Remote lacunar infarct at the left basal ganglia. Encephalomalacia involving the left temporal occipital region, consistent with Lolitha Tortora chronic left MCA territory infarct. No acute intracranial hemorrhage. No acute large vessel territory infarct. No mass lesion or midline shift. No hydrocephalus or extra-axial fluid collection. Vascular: No abnormal hyperdense vessel. Calcified atherosclerosis present at the skull base. Skull: Scalp soft tissues within normal limits for age calvarium intact. Sinuses/Orbits: Globes and orbital soft tissues within normal limits. Paranasal sinuses and mastoid air cells are largely clear. Other: None. Review of the MIP images confirms the above findings CTA NECK FINDINGS Aortic arch: Visualized aortic arch within normal limits for caliber with standard 3 vessel morphology. Mild aortic atherosclerosis. No significant stenosis about the origin the great vessels. Right carotid system: Right common and internal carotid arteries are patent without stenosis or dissection. Left carotid system: Left common and internal carotid arteries are patent without stenosis or dissection. Vertebral arteries: Both vertebral arteries arise from subclavian arteries. Vertebral arteries are patent without stenosis or dissection. Skeleton: No worrisome osseous lesions.  Patient is edentulous. Other neck: No other acute finding. Upper chest: Emphysema. Spiculated pulmonary nodule at the medial left upper lobe measures 2.3 x 2.8 x 1.9 cm, concerning for Graceanna Theissen primary bronchogenic carcinoma (series 14, image 22). Review of the MIP images confirms the above findings  CTA HEAD FINDINGS Anterior circulation: Mild atheromatous change about the carotid siphons without hemodynamically significant stenosis. A1 segments patent bilaterally. Left A1 dominant. Normal anterior communicating artery complex. Anterior cerebral arteries are patent without stenosis. No M1 stenosis or occlusion. No proximal MCA branch occlusion or high-grade stenosis. Distal MCA branches perfused and symmetric. Posterior circulation: Both V4 segments patent without significant stenosis. Left vertebral artery slightly dominant. Both PICA patent at their origins. Basilar patent without stenosis. Superior cerebellar and posterior cerebral arteries patent bilaterally. Venous sinuses: Patent allowing for timing the contrast bolus. Anatomic variants: As above.  No aneurysm. Review of the MIP images confirms the above findings IMPRESSION: CT HEAD: 1. No acute intracranial abnormality. 2. Chronic left MCA territory infarct. 3. Underlying mild chronic microvascular ischemic disease. CTA HEAD AND NECK: 1. Negative CTA for large vessel occlusion or other emergent finding. 2. Mild atheromatous change about the carotid siphons without hemodynamically significant stenosis. 3. 2.3 x 2.8 x 1.9 cm spiculated pulmonary nodule at the medial left upper lobe, concerning for [employer] primary bronchogenic carcinoma. Aortic Atherosclerosis (ICD10-I70.0) and Emphysema (ICD10-J43.9). Electronically Signed   By: Morene Hoard M.D.   On: 03/26/2024 01:09        Scheduled Meds:  atorvastatin   10 mg Oral Daily   diltiazem   120 mg Oral Daily  flecainide  50 mg Oral Q12H   metoprolol  succinate  12.5 mg Oral Daily   Continuous Infusions:  heparin  1,200 Units/hr (03/27/24 1543)     LOS: 0 days    Time spent: over 30 min     Meliton Monte, MD [facility]   To contact the attending provider between 7A-7P or the covering provider during after hours 7P-7A, please log into the web site [email or web address] and access  using universal [place] password for that web site. If you do not have the password, please call the hospital operator.  03/27/2024, 7:05 PM

## 2024-03-27 NOTE — Care Management Obs Status (Signed)
 MEDICARE OBSERVATION STATUS NOTIFICATION   Patient Details  Name: Michelle Dean MRN: [id number] Date of Birth: 1945/12/04   Medicare Observation Status Notification Given:  Yes    Nikiya Starn G., RN 03/27/2024, 9:00 AM

## 2024-03-28 DIAGNOSIS — I483 Typical atrial flutter: Secondary | ICD-10-CM | POA: Diagnosis not present

## 2024-03-28 LAB — CBC WITH DIFFERENTIAL/PLATELET
Abs Immature Granulocytes: 0.02 K/uL (ref 0.00–0.07)
Basophils Absolute: 0.1 K/uL (ref 0.0–0.1)
Basophils Relative: 1 %
Eosinophils Absolute: 0.2 K/uL (ref 0.0–0.5)
Eosinophils Relative: 3 %
HCT: 38.6 % (ref 36.0–46.0)
Hemoglobin: 12.2 g/dL (ref 12.0–15.0)
Immature Granulocytes: 0 %
Lymphocytes Relative: 35 %
Lymphs Abs: 2.8 K/uL (ref 0.7–4.0)
MCH: 29.2 pg (ref 26.0–34.0)
MCHC: 31.6 g/dL (ref 30.0–36.0)
MCV: 92.3 fL (ref 80.0–100.0)
Monocytes Absolute: 1.1 K/uL — ABNORMAL HIGH (ref 0.1–1.0)
Monocytes Relative: 14 %
Neutro Abs: 3.7 K/uL (ref 1.7–7.7)
Neutrophils Relative %: 47 %
Platelets: 226 K/uL (ref 150–400)
RBC: 4.18 MIL/uL (ref 3.87–5.11)
RDW: 14.8 % (ref 11.5–15.5)
WBC: 7.8 K/uL (ref 4.0–10.5)
nRBC: 0 % (ref 0.0–0.2)

## 2024-03-28 LAB — COMPREHENSIVE METABOLIC PANEL WITH GFR
ALT: 13 U/L (ref 0–44)
AST: 15 U/L (ref 15–41)
Albumin: 2.8 g/dL — ABNORMAL LOW (ref 3.5–5.0)
Alkaline Phosphatase: 51 U/L (ref 38–126)
Anion gap: 10 (ref 5–15)
BUN: 20 mg/dL (ref 8–23)
CO2: 24 mmol/L (ref 22–32)
Calcium: 8.5 mg/dL — ABNORMAL LOW (ref 8.9–10.3)
Chloride: 106 mmol/L (ref 98–111)
Creatinine, Ser: 1.23 mg/dL — ABNORMAL HIGH (ref 0.44–1.00)
GFR, Estimated: 45 mL/min — ABNORMAL LOW (ref >60)
Glucose, Bld: 83 mg/dL (ref 70–99)
Potassium: 3.7 mmol/L (ref 3.5–5.1)
Sodium: 140 mmol/L (ref 135–145)
Total Bilirubin: 0.8 mg/dL (ref 0.0–1.2)
Total Protein: 5.4 g/dL — ABNORMAL LOW (ref 6.5–8.1)

## 2024-03-28 LAB — MAGNESIUM: Magnesium: 1.8 mg/dL (ref 1.7–2.4)

## 2024-03-28 LAB — HEPARIN LEVEL (UNFRACTIONATED): Heparin Unfractionated: >1.1 [IU]/mL — ABNORMAL HIGH (ref 0.30–0.70)

## 2024-03-28 LAB — APTT: aPTT: 115 s — ABNORMAL HIGH (ref 24–36)

## 2024-03-28 LAB — PHOSPHORUS: Phosphorus: 3.6 mg/dL (ref 2.5–4.6)

## 2024-03-28 MED ORDER — APIXABAN 5 MG PO TABS
5.0000 mg | ORAL_TABLET | Freq: Two times a day (BID) | ORAL | Status: DC
Start: 2024-03-28 — End: 2024-03-28
  Administered 2024-03-28: 5 mg via ORAL
  Filled 2024-03-28: qty 1

## 2024-03-28 NOTE — Discharge Summary (Signed)
 Physician Discharge Summary  Imo Cumbie FMW:[id number] DOB: 05-31-1945 DOA: 03/25/2024  PCP: Bonney Lynwood BRAVO, MD  Admit date: 03/25/2024 Discharge date: 03/28/2024  Time spent: 40 minutes  Recommendations for Outpatient Follow-up:  Follow outpatient CBC/CMP  Follow with cardiology outpatient Follow flecainide level Follow with pulm outpatient for pulmonary nodule   Discharge Diagnoses:  Principal Problem:   Typical atrial flutter (HCC) Active Problems:   Paroxysmal atrial fibrillation (HCC)   Aphasia   Mixed hyperlipidemia   CKD stage 3a, GFR 45-59 ml/min (HCC)   Chronic diastolic CHF (congestive heart failure) (HCC)   History of stroke   COPD (chronic obstructive pulmonary disease) (HCC)   Pulmonary nodules   Discharge Condition: stable  Diet recommendation: heart healthy   Filed Weights   03/25/24 1527 03/27/24 0452 03/28/24 0412  Weight: 81.6 kg 83.4 kg 82.1 kg    History of present illness:   Stephania Macfarlane is Raushanah Osmundson 78 y.o. female with medical history significant of hypertension, hyperlipidemia, CKD 3A, history of CVA, PAF on Eliquis , and chronic HFpEF presenting with Viaan Knippenberg-fib with RVR.    Now back in sinus rhythm.  Cards recommending continue flecainide, diltiazem  (metoprolol  d/c'd).  Spiculated pulmonary nodule noted on CT.  Pulm offered navigational bronchoscopy, but she declined.    Stable for discharge today.    Hospital Course:  Assessment and Plan:  Paroxysmal atrial fibrillation (HCC) Rapid Ventricular Rate Presenting with Jenesis Martin-fib with RVR with heart rate into the 120s requiring diltiazem  drip on presentation Noted admission roughly 1 month ago for similar presentation requiring cardioversion and triple therapy with metoprolol , Cardizem  and flecainide Some concern for medication compliance at home with suspected undiagnosed dementia Now back on flecainide, diltiazem  - metoprolol   Eliquis   Follow-up cardiology recommendations   Pulmonary nodules Noted  left upper lobe nodule that is effectively doubled from recent imaging Nodule has grown in size to 2.3x2.8x1.9cm from 1.4 x 1.7cm  since May 2025 S/p prior bronchoscopy with biopsy performed after PET CT- biopsy noncancerous  Dr. Kara offered navigational bronchoscopy - she's not currently interested in Yazmeen Woolf procedure.  Will place pulm referral so they can follow outpatient.     COPD (chronic obstructive pulmonary disease) (HCC) Stable from Naleah Kofoed respiratory standpoint Continue home inhalers   History of stroke History of Aphasia Positive remote history of CVA Has had some memory and word finding issues of undetermined duration CT of the head negative for any LVO or acute findings MRI without acute findings Otherwise continue regimen including statin and Eliquis     Chronic diastolic CHF (congestive heart failure) (HCC) 2D echo October 2025 with EF of 60 to 65% Valvular function otherwise stable apart from aortic stenosis Appears euvolemic Monitor volume status   CKD stage 3a, GFR 45-59 ml/min (HCC) Cr 1.2 w/ GFR in the 40s  Looks to be near baseline    Mixed hyperlipidemia Continue lipitor    Cognitive Deficit Delirium precautions MRI negative for acute findings Baseline transient word-finding difficulty mild confusion on presentation MRI negative for acute stroke Follow outpatient - per discussion with son, she's at baseline Consider neurology follow up outpatient    Obesity Body mass index is 31.07 kg/m.     Procedures: none  Consultations: Cardiology pulmonology  Discharge Exam: Vitals:   03/28/24 0804 03/28/24 1139  BP: 110/80 115/68  Pulse: 63 63  Resp: 16 18  Temp: 97.6 F (36.4 C) 97.6 F (36.4 C)  SpO2: 98% 98%   No complaints Not interested in procedure Doesn't believe she has  cancer Discussed with son, he knows she's not interested in Willeen Novak repeat procedure Discussed discharge and pulm referral  General: No acute distress. Cardiovascular:  RRR Lungs: unlabored Neurological: Alert. Moves all extremities 4 with equal strength. Cranial nerves II through XII grossly intact. Extremities: No clubbing or cyanosis. No edema.   Discharge Instructions   Discharge Instructions     Call MD for:  difficulty breathing, headache or visual disturbances   Complete by: As directed    Call MD for:  extreme fatigue   Complete by: As directed    Call MD for:  hives   Complete by: As directed    Call MD for:  persistant dizziness or light-headedness   Complete by: As directed    Call MD for:  persistant nausea and vomiting   Complete by: As directed    Call MD for:  redness, tenderness, or signs of infection (pain, swelling, redness, odor or green/yellow discharge around incision site)   Complete by: As directed    Call MD for:  severe uncontrolled pain   Complete by: As directed    Call MD for:  temperature >100.4   Complete by: As directed    Diet - low sodium heart healthy   Complete by: As directed    Discharge instructions   Complete by: As directed    You were seen for atrial fibrillation with Genevie Elman fast heart rate.  Your heart rate has now improved.  You'll discharge home on diltiazem  and flecainide.  Cardiology recommends stopping metoprolol .  Your CT scan showed an enlarged nodule in the left upper lobe which is concerning for cancer.  You declined Anabelen Kaminsky bronchoscopy.  I'll refer you to pulm as an outpatient to consider this procedure in the future.  Your MRI did not show any stroke.  You should follow with neurology outpatient for your cognitive deficit.   Return for new, recurrent, of worsening symptoms.  Please ask your PCP to request records from this hospitalization so they know what was done and what the next steps will be.   Increase activity slowly   Complete by: As directed       Allergies as of 03/28/2024       Reactions   Aspirin     Other reaction(s): Bleeding   Penicillins Hives, Rash        Medication  List     STOP taking these medications    metoprolol  succinate 25 MG 24 hr tablet Commonly known as: Toprol  XL       TAKE these medications    acetaminophen  500 MG tablet Commonly known as: TYLENOL  Take 500 mg by mouth every 6 (six) hours as needed for moderate pain (pain score 4-6).   albuterol  (2.5 MG/3ML) 0.083% nebulizer solution Commonly known as: PROVENTIL  Take 2.5 mg by nebulization every 4 (four) hours as needed for wheezing.   atorvastatin  10 MG tablet Commonly known as: LIPITOR Take 1 tablet (10 mg total) by mouth daily.   carboxymethylcellulose 0.5 % Soln Commonly known as: REFRESH PLUS Place 1 drop into both eyes 4 (four) times daily as needed (dry eyes).   CertaVite/Antioxidants Tabs Take 1 tablet by mouth daily.   cholecalciferol 25 MCG (1000 UNIT) tablet Commonly known as: VITAMIN D3 Take 1,000 Units by mouth daily.   diltiazem  120 MG 24 hr capsule Commonly known as: CARDIZEM  CD Take 1 capsule (120 mg total) by mouth every evening.   Dulera  100-5 MCG/ACT Aero Generic drug: mometasone -formoterol  Inhale 2 puffs into the  lungs 2 (two) times daily.   Eliquis  5 MG Tabs tablet Generic drug: apixaban  TAKE ONE TABLET TWICE Airelle Everding DAY.   flecainide 50 MG tablet Commonly known as: TAMBOCOR Take 1 tablet (50 mg total) by mouth every 12 (twelve) hours.   Incruse Ellipta  62.5 MCG/ACT Aepb Generic drug: umeclidinium bromide  Inhale 1 puff into the lungs daily.   magnesium  oxide 400 (240 Mg) MG tablet Commonly known as: MAG-OX Take 1 tablet by mouth daily.   omeprazole  20 MG capsule Commonly known as: PRILOSEC Take 1 capsule (20 mg total) by mouth daily.   senna-docusate 8.6-50 MG tablet Commonly known as: Senokot-S Take 2 tablets by mouth 2 (two) times daily. What changed:  when to take this reasons to take this       Allergies  Allergen Reactions   Aspirin      Other reaction(s): Bleeding   Penicillins Hives and Rash      The results of  significant diagnostics from this hospitalization (including imaging, microbiology, ancillary and laboratory) are listed below for reference.    Significant Diagnostic Studies: MR BRAIN WO CONTRAST Result Date: 03/27/2024 EXAM: MRI BRAIN WITHOUT CONTRAST 03/27/2024 02:01:00 PM TECHNIQUE: Multiplanar multisequence MRI of the head/brain was performed without the administration of intravenous contrast. COMPARISON: MR Head without IV contrast 02/26/2021. CLINICAL HISTORY: Mental status change. FINDINGS: BRAIN AND VENTRICLES: No acute infarct. Remote left posterior MCA territory infarct within encephalomalacia, gliosis and evidence of prior hemorrhage. No intracranial hemorrhage. No mass. No midline shift. No hydrocephalus. The sella is unremarkable. Normal flow voids. ORBITS: No acute abnormality. SINUSES AND MASTOIDS: No acute abnormality. BONES AND SOFT TISSUES: Normal marrow signal. No acute soft tissue abnormality. IMPRESSION: 1. No acute intracranial abnormality. 2. Remote left MCA territory infarct. Electronically signed by: Gilmore Molt MD 03/27/2024 03:16 PM EST RP Workstation: [id number]   CT ANGIO HEAD NECK W WO CM Result Date: 03/26/2024 CLINICAL DATA:  Initial evaluation for acute neuro deficit, stroke. EXAM: CT ANGIOGRAPHY HEAD AND NECK WITH AND WITHOUT CONTRAST TECHNIQUE: Multidetector CT imaging of the head and neck was performed using the standard protocol during bolus administration of intravenous contrast. Multiplanar CT image reconstructions and MIPs were obtained to evaluate the vascular anatomy. Carotid stenosis measurements (when applicable) are obtained utilizing NASCET criteria, using the distal internal carotid diameter as the denominator. RADIATION DOSE REDUCTION: This exam was performed according to the departmental dose-optimization program which includes automated exposure control, adjustment of the mA and/or kV according to patient size and/or use of iterative reconstruction  technique. CONTRAST:  75mL OMNIPAQUE  IOHEXOL  350 MG/ML SOLN COMPARISON:  Prior study from 02/25/2021 FINDINGS: CT HEAD FINDINGS Brain: Cerebral volume within normal limits. Mild chronic microvascular ischemic disease. Remote lacunar infarct at the left basal ganglia. Encephalomalacia involving the left temporal occipital region, consistent with Modesty Rudy chronic left MCA territory infarct. No acute intracranial hemorrhage. No acute large vessel territory infarct. No mass lesion or midline shift. No hydrocephalus or extra-axial fluid collection. Vascular: No abnormal hyperdense vessel. Calcified atherosclerosis present at the skull base. Skull: Scalp soft tissues within normal limits for age calvarium intact. Sinuses/Orbits: Globes and orbital soft tissues within normal limits. Paranasal sinuses and mastoid air cells are largely clear. Other: None. Review of the MIP images confirms the above findings CTA NECK FINDINGS Aortic arch: Visualized aortic arch within normal limits for caliber with standard 3 vessel morphology. Mild aortic atherosclerosis. No significant stenosis about the origin the great vessels. Right carotid system: Right common and internal carotid arteries are patent  without stenosis or dissection. Left carotid system: Left common and internal carotid arteries are patent without stenosis or dissection. Vertebral arteries: Both vertebral arteries arise from subclavian arteries. Vertebral arteries are patent without stenosis or dissection. Skeleton: No worrisome osseous lesions.  Patient is edentulous. Other neck: No other acute finding. Upper chest: Emphysema. 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 Review of the MIP images confirms the above findings CTA HEAD FINDINGS Anterior circulation: Mild atheromatous change about the carotid siphons without hemodynamically significant stenosis. A1  segments patent bilaterally. Left A1 dominant. Normal anterior communicating artery complex. Anterior cerebral arteries are patent without stenosis. No M1 stenosis or occlusion. No proximal MCA branch occlusion or high-grade stenosis. Distal MCA branches perfused and symmetric. Posterior circulation: Both V4 segments patent without significant stenosis. Left vertebral artery slightly dominant. Both PICA patent at their origins. Basilar patent without stenosis. Superior cerebellar and posterior cerebral arteries patent bilaterally. Venous sinuses: Patent allowing for timing the contrast bolus. Anatomic variants: As above.  No aneurysm. Review of the MIP images confirms the above findings IMPRESSION: CT HEAD: 1. No acute intracranial abnormality. 2. Chronic left MCA territory infarct. 3. Underlying mild chronic microvascular ischemic disease. CTA HEAD AND NECK: 1. Negative CTA for large vessel occlusion or other emergent finding. 2. Mild atheromatous change about the carotid siphons without hemodynamically significant stenosis. 3. 2.3 x 2.8 x 1.9 cm spiculated pulmonary nodule at the medial left upper lobe, concerning for Dallis Darden primary bronchogenic carcinoma. Aortic Atherosclerosis (ICD10-I70.0) and Emphysema (ICD10-J43.9). Electronically Signed   By: Morene Hoard M.D.   On: 03/26/2024 01:09   DG Chest 2 View Result Date: 03/25/2024 CLINICAL DATA:  Dizziness and tachycardia. EXAM: CHEST - 2 VIEW COMPARISON:  February 15, 2024 FINDINGS: The cardiac silhouette is borderline in size and stable in appearance. Moderate severity calcification of the aortic arch is seen. There is very mild left infrahilar linear scarring and/or atelectasis. No acute infiltrate, pleural effusion or pneumothorax is identified. The visualized skeletal structures are unremarkable. IMPRESSION: No acute cardiopulmonary disease. Electronically Signed   By: Suzen Dials M.D.   On: 03/25/2024 16:28    Microbiology: No results found for  this or any previous visit (from the past 240 hours).   Labs: Basic Metabolic Panel: Recent Labs  Lab 03/25/24 1533 03/28/24 0602  NA 142 140  K 4.4 3.7  CL 109 106  CO2 22 24  GLUCOSE 97 83  BUN 17 20  CREATININE 1.23* 1.23*  CALCIUM  9.1 8.5*  MG  --  1.8  PHOS  --  3.6   Liver Function Tests: Recent Labs  Lab 03/28/24 0602  AST 15  ALT 13  ALKPHOS 51  BILITOT 0.8  PROT 5.4*  ALBUMIN 2.8*   No results for input(s): LIPASE, AMYLASE in the last 168 hours. No results for input(s): AMMONIA in the last 168 hours. CBC: Recent Labs  Lab 03/25/24 1533 03/27/24 0807 03/28/24 0602  WBC 10.0 8.2 7.8  NEUTROABS  --   --  3.7  HGB 13.8 13.2 12.2  HCT 43.9 43.1 38.6  MCV 92.2 95.1 92.3  PLT 283 227 226   Cardiac Enzymes: No results for input(s): CKTOTAL, CKMB, CKMBINDEX, TROPONINI in the last 168 hours. BNP: BNP (last 3 results) Recent Labs    02/15/24 2330  BNP 253.9*    ProBNP (last 3 results) No results for input(s): PROBNP in  the last 8760 hours.  CBG: No results for input(s): GLUCAP in the last 168 hours.     Signed:  Meliton Monte MD.  [facility] 03/28/2024, 2:16 PM

## 2024-03-28 NOTE — Evaluation (Signed)
 Occupational Therapy Evaluation Patient Details Name: Michelle Dean MRN: [id number] DOB: [birth year] Today's Date: 03/28/2024   History of Present Illness   The pt is a 78 yo female presenting 11/14 with chest palpitations. Admitted for management of afib with RVR. Imaging showed L upper lobe nodule with increased size since last imaging in May. PMH includes: afib, COPD (intermittently uses oxygen at home), HTN, HLD, L MCA CVA, chronic HFpEF.     Clinical Impressions Pt feeling better, overall doing well, likely close to baseline. Pt lives alone, has supportive son, PLOF independent. Pt with history of CVA, aphasia, currently likely close to baseline, overall supervision for safety with mobility and upright ADLs. Pt able to ambulate with supervision without RW, PT note mentions slight LOB earlier this morning. Pt encouraged to use RW/cane, but was not able to use RW safely during session after repeated attempts, instructed to use cane if needed, Pt frequently walks her dog outside each day. Pt declines need for further therapy, declines need for follow up therapy, no further acute OT needs.      If plan is discharge home, recommend the following:   Assist for transportation     Functional Status Assessment   Patient has had a recent decline in their functional status and demonstrates the ability to make significant improvements in function in a reasonable and predictable amount of time.     Equipment Recommendations   None recommended by OT     Recommendations for Other Services         Precautions/Restrictions   Precautions Precautions: Fall Recall of Precautions/Restrictions: Intact Restrictions Weight Bearing Restrictions Per Provider Order: No     Mobility Bed Mobility Overal bed mobility: Modified Independent                  Transfers Overall transfer level: Needs assistance Equipment used: None Transfers: Sit to/from Stand Sit to Stand:  Supervision           General transfer comment: superivsion for safety, poor safety with RW, repeatedly not able to safely use RW during session even with verbal cueing, able to ambulate without RW as needed, had mild LOB with PT earlier this morning      Balance Overall balance assessment: Needs assistance Sitting-balance support: No upper extremity supported, Feet supported Sitting balance-Leahy Scale: Good     Standing balance support: No upper extremity supported, During functional activity Standing balance-Leahy Scale: Fair Standing balance comment: overall fair static standing                           ADL either performed or assessed with clinical judgement   ADL Overall ADL's : At baseline;Needs assistance/impaired                                       General ADL Comments: supervision for safety with upright activities. Pt with decreased safety awareness, history of CVA and aphasia, likely functioning at baseline.     Vision Ability to See in Adequate Light: 0 Adequate Patient Visual Report: No change from baseline       Perception         Praxis         Pertinent Vitals/Pain Pain Assessment Pain Assessment: No/denies pain     Extremity/Trunk Assessment Upper Extremity Assessment Upper Extremity Assessment: Overall WFL for tasks assessed  Communication Communication Communication: Impaired Factors Affecting Communication: Difficulty expressing self;Other (comment) (hx of aphasia)   Cognition Arousal: Alert Behavior During Therapy: WFL for tasks assessed/performed Cognition: No apparent impairments                               Following commands: Intact       Cueing  General Comments   Cueing Techniques: Verbal cues      Exercises     Shoulder Instructions      Home Living Family/patient expects to be discharged to:: Private residence Living Arrangements: Alone Available Help at  Discharge: Family;Available PRN/intermittently Type of Home: House Home Access: Level entry     Home Layout: One level     Bathroom Shower/Tub: Chief Strategy Officer: Standard     Home Equipment: Agricultural Consultant (2 wheels);Cane - single point;BSC/3in1;Shower seat   Additional Comments: Pt lives alone, has son who can assist at times      Prior Functioning/Environment Prior Level of Function : Independent/Modified Independent             Mobility Comments: no use of DME, no falls, walks dog 4 times/day ADLs Comments: pt reports independent, manages her one meds    OT Problem List: Impaired balance (sitting and/or standing);Decreased cognition;Decreased safety awareness   OT Treatment/Interventions:        OT Goals(Current goals can be found in the care plan section)   Acute Rehab OT Goals Patient Stated Goal: to return home OT Goal Formulation: With patient Time For Goal Achievement: 04/11/24 Potential to Achieve Goals: Good   OT Frequency:       Co-evaluation              AM-PAC OT 6 Clicks Daily Activity     Outcome Measure Help from another person eating meals?: None Help from another person taking care of personal grooming?: None Help from another person toileting, which includes using toliet, bedpan, or urinal?: None Help from another person bathing (including washing, rinsing, drying)?: None Help from another person to put on and taking off regular upper body clothing?: None Help from another person to put on and taking off regular lower body clothing?: None 6 Click Score: 24   End of Session Equipment Utilized During Treatment: Gait belt;Rolling walker (2 wheels) Nurse Communication: Mobility status  Activity Tolerance: Patient tolerated treatment well Patient left: in bed;with call bell/phone within reach  OT Visit Diagnosis: Unsteadiness on feet (R26.81);Other abnormalities of gait and mobility (R26.89);Other symptoms and  signs involving cognitive function                Time: 8565-8550 OT Time Calculation (min): 15 min Charges:  OT General Charges $OT Visit: 1 Visit OT Evaluation $OT Eval Low Complexity: 1 Low  [id number] OTR/L   Elouise JONELLE Bott 03/28/2024, 3:12 PM

## 2024-03-28 NOTE — Progress Notes (Signed)
 PHARMACY - ANTICOAGULATION  Pharmacy Consult for heparin   Indication: atrial fibrillation  Allergies  Allergen Reactions   Aspirin      Other reaction(s): Bleeding   Penicillins Hives and Rash    Patient Measurements: Height: 5' 4 (162.6 cm) Weight: 82.1 kg (181 lb) IBW/kg (Calculated) : 54.7 HEPARIN  DW (KG): 72.3  Vital Signs: Temp: 97.6 F (36.4 C) (11/16 2318) Temp Source: Oral (11/16 2318) BP: 134/81 (11/16 2318) Pulse Rate: 63 (11/16 2032)  Labs: Recent Labs    03/25/24 1533 03/25/24 1533 03/25/24 1746 03/27/24 0014 03/27/24 0807 03/27/24 1419 03/28/24 0602  HGB 13.8  --   --   --  13.2  --  12.2  HCT 43.9  --   --   --  43.1  --  38.6  PLT 283  --   --   --  227  --  226  APTT  --    < >  --  52* 72* 57* 115*  HEPARINUNFRC  --   --   --  >1.10*  --   --  >1.10*  CREATININE 1.23*  --   --   --   --   --  1.23*  TROPONINIHS 12  --  13  --   --   --   --    < > = values in this interval not displayed.    Estimated Creatinine Clearance: 38.5 mL/min (A) (by C-G formula based on SCr of 1.23 mg/dL (H)).   Assessment: 78 y.o. female with h/o Afib, PTA Eliquis  on hold for potential bronchoscopy this admission, pharmacy dosing IV heparin .  Heparin  level remains >1.1 due to DOAC use. aPTT is above goal at 117 seconds. Confirmed lab drawn on opposite extremity.   Goal of Therapy:  Heparin  level 0.3-0.7 units/ml aPTT 66-102 seconds Monitor platelets by anticoagulation protocol: Yes   Plan:  Reduce heparin  to 1100 units/h Recheck aPTT in 8h  Ozell Jamaica, PharmD, [place], [facility] Clinical Pharmacist [phone] Please check AMION for all [facility] Pharmacy numbers 03/28/2024

## 2024-03-28 NOTE — Evaluation (Signed)
 Physical Therapy Evaluation Patient Details Name: Michelle Dean MRN: [id number] DOB: 19-Dec-1945 Today's Date: 03/28/2024  History of Present Illness  The pt is a 78 yo female presenting 11/14 with chest palpitations. Admitted for management of afib with RVR. Imaging showed L upper lobe nodule with increased size since last imaging in May. PMH includes: afib, COPD (intermittently uses oxygen at home), HTN, HLD, L MCA CVA, chronic HFpEF.   Clinical Impression  Pt in bed upon arrival of PT, agreeable to evaluation at this time. Prior to admission the pt reports she was living at home without need for DME or assistance, reports managing her ADLs and IADLs without assist, as well as walking her little dog 4x/day. However, with further questioning, pt with inconsistent answers and reports she has intermittent support available and someone who brings her groceries. The pt presents with mild deficits in strength, stability, and endurance. She required minA x2 to recover lateral LOB, once on initial stand and once with turning during ambulation. Recommend use of RW for improved balance with gait and follow up PT to further progress endurance and dynamic stability. Pt is at elevated risk of falls at home, recommend continued skilled PT acutely as well as increased supervision and follow up therapies and aide after return home. The pt is adamant she can return home with supports in place, but and increased support possible would be beneficial for improved safety.       If plan is discharge home, recommend the following: A little help with walking and/or transfers;A little help with bathing/dressing/bathroom;Assistance with cooking/housework;Direct supervision/assist for medications management;Direct supervision/assist for financial management;Assist for transportation;Help with stairs or ramp for entrance   Can travel by private vehicle        Equipment Recommendations Rolling walker (2 wheels)   Recommendations for Other Services       Functional Status Assessment Patient has had a recent decline in their functional status and demonstrates the ability to make significant improvements in function in a reasonable and predictable amount of time.     Precautions / Restrictions Precautions Precautions: Fall Recall of Precautions/Restrictions: Intact Restrictions Weight Bearing Restrictions Per Provider Order: No      Mobility  Bed Mobility Overal bed mobility: Modified Independent             General bed mobility comments: HOB elevated, slightly increased time    Transfers Overall transfer level: Needs assistance Equipment used: None Transfers: Sit to/from Stand Sit to Stand: Supervision           General transfer comment: no assist, reaching for UE support due to LOB to the L, but was able to self-correct and then stand without UE support    Ambulation/Gait Ambulation/Gait assistance: Contact guard assist Gait Distance (Feet): 200 Feet Assistive device: None Gait Pattern/deviations: Step-through pattern, Decreased stride length Gait velocity: decreased Gait velocity interpretation: <1.31 ft/sec, indicative of household ambulator   General Gait Details: pt with slow gait but generally stable without challenge. VSS on RA. single LOB with turn to the L needing minA to correct. limited ability to follow instructions for balance testing      Balance Overall balance assessment: Needs assistance Sitting-balance support: No upper extremity supported Sitting balance-Leahy Scale: Good     Standing balance support: No upper extremity supported, During functional activity Standing balance-Leahy Scale: Fair Standing balance comment: LOB to L with initial sit-stand and LOB to the R with turn to the L while walking. minA to correct  Pertinent Vitals/Pain Pain Assessment Pain Assessment: No/denies pain    Home Living  Family/patient expects to be discharged to:: Private residence Living Arrangements: Alone Available Help at Discharge: Family;Available PRN/intermittently (son helps but works during the day) Type of Home: House Home Access: Level entry       Home Layout: One level Home Equipment: [employer] (2 wheels);Cane - single point;BSC/3in1;Shower seat (had suction grab bars but they fell)      Prior Function Prior Level of Function : Independent/Modified Independent (reports hasnt driven in 2 years)             Mobility Comments: no use of DME, no falls, walks dog 4 times/day ADLs Comments: pt reports independent, manages her one meds     Extremity/Trunk Assessment   Upper Extremity Assessment Upper Extremity Assessment: Defer to OT evaluation    Lower Extremity Assessment Lower Extremity Assessment: Generalized weakness (grossly 4/5 to MMT without change in sensation)    Cervical / Trunk Assessment Cervical / Trunk Assessment: Normal  Communication   Communication Communication: Impaired Factors Affecting Communication: Difficulty expressing self (hx of aphasia)    Cognition Arousal: Alert Behavior During Therapy: WFL for tasks assessed/performed   PT - Cognitive impairments: Difficult to assess Difficult to assess due to: Impaired communication                     PT - Cognition Comments: hx of aphasia, at times needing question repeated a few times before answering, no family present to confirm. Followed simple commands well in session Following commands: Intact       Cueing Cueing Techniques: Verbal cues     General Comments General comments (skin integrity, edema, etc.): VSS onRA, BP from 142/68 in supine to 124/89 after gait    Exercises     Assessment/Plan    PT Assessment Patient needs continued PT services  PT Problem List Decreased strength;Decreased activity tolerance;Decreased balance;Decreased mobility;Decreased cognition       PT  Treatment Interventions DME instruction;Stair training;Functional mobility training;Gait training;Therapeutic activities;Therapeutic exercise;Balance training;Patient/family education    PT Goals (Current goals can be found in the Care Plan section)  Acute Rehab PT Goals Patient Stated Goal: to return home to her dog PT Goal Formulation: With patient Time For Goal Achievement: 04/11/24 Potential to Achieve Goals: Fair    Frequency Min 2X/week        AM-PAC PT 6 Clicks Mobility  Outcome Measure Help needed turning from your back to your side while in a flat bed without using bedrails?: None Help needed moving from lying on your back to sitting on the side of a flat bed without using bedrails?: A Little Help needed moving to and from a bed to a chair (including a wheelchair)?: A Little Help needed standing up from a chair using your arms (e.g., wheelchair or bedside chair)?: A Little Help needed to walk in hospital room?: A Little Help needed climbing 3-5 steps with a railing? : A Little 6 Click Score: 19    End of Session Equipment Utilized During Treatment: Gait belt Activity Tolerance: Patient tolerated treatment well Patient left: in chair;with call bell/phone within reach Nurse Communication: Mobility status PT Visit Diagnosis: Unsteadiness on feet (R26.81);Other abnormalities of gait and mobility (R26.89)    Time: 9063-8993 PT Time Calculation (min) (ACUTE ONLY): 30 min   Charges:   PT Evaluation $PT Eval Moderate Complexity: 1 Mod PT Treatments $Therapeutic Exercise: 8-22 mins PT General Charges $$ ACUTE PT VISIT: 1  Visit         Izetta Call, PT, DPT   Acute Rehabilitation Department Office [phone] Secure Chat Communication Preferred  Izetta JULIANNA Call 03/28/2024, 11:14 AM

## 2024-03-28 NOTE — Progress Notes (Signed)
  Progress Note  Patient Name: Michelle Dean Date of Encounter: 03/28/2024 [facility] Cardiologist: Madonna Large, DO   Interval Summary   No events overnight. She denies any CP or dyspnea. She reported that she does not want to do the procedure.   Vital Signs Vitals:   03/27/24 1453 03/27/24 2032 03/27/24 2318 03/28/24 0412  BP: 136/78 129/71 134/81   Pulse: 81 63    Resp:  18 18   Temp:  98.8 F (37.1 C) 97.6 F (36.4 C)   TempSrc:  Oral Oral   SpO2: 98% 100% 98%   Weight:    82.1 kg  Height:        Intake/Output Summary (Last 24 hours) at 03/28/2024 0803 Last data filed at 03/28/2024 0655 Gross per 24 hour  Intake 732.44 ml  Output 600 ml  Net 132.44 ml      03/28/2024    4:12 AM 03/27/2024    4:52 AM 03/25/2024    3:27 PM  Last 3 Weights  Weight (lbs) 181 lb 183 lb 13.8 oz 179 lb 14.3 oz  Weight (kg) 82.101 kg 83.4 kg 81.6 kg      Telemetry/ECG  Sinus bradycardia - Personally Reviewed  Physical Exam  GEN: No acute distress.   Neck: No JVD Cardiac: Bradycardic but regular rhythm, no murmurs, rubs, or gallops.  Respiratory: Clear to auscultation bilaterally. 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   #Afib/Aflutter #HFpEF - Asymptomatic and sinus bradycardia on telemetry  - Cont flecainide 50 BID, and Dilt 120 daily - Stop metop XL given sinus bradycardia and older age - Follow up with flecainide level - Cont hep gtt; can transition back to DOAC after bronch (if done) - We will sign off; feel free to call us  back if you have any questions or concerns  For questions or updates, please contact [facility] Please consult [email or web address] for contact info under   Signed, Joelle VEAR Ren Donley, MD

## 2024-03-28 NOTE — TOC CM/SW Note (Signed)
 Transition of Care [facility]) - Inpatient Brief Assessment   Patient Details  Name: Michelle Dean MRN: [id number] Date of Birth: 05/18/1945  Transition of Care [facility]) CM/SW Contact:    Sudie Erminio Deems, RN Phone Number: 03/28/2024, 12:20 PM   Clinical Narrative: Patient presented for A fib with RVR. PTA patient was from home alone. ICM asked the patient if she could call the son and she provided verbal permission to call the son. ICM spoke with Toby and he states patient has DME rolling walker in the home as needed. Son states patient still works in the yard and mows grass. Patient stated she did not feel like she needed [facility] PT and son feels the same way. ICM expressed to son that if the patient needs services in the future to contact her PCP. Son states he or his cousin takes the patient to appointments. ICM did discuss medication compliance with the son and he states he manages all of her medications- states she has a day and night pill box that he fills. ICM will continue to follow for additional needs as she progresses.     Transition of Care Asessment: Insurance and Status: Insurance coverage has been reviewed Patient has primary care physician: Yes Home environment has been reviewed: reviewed- lives alone with support of son. Prior level of function:: independent with support of rolling walker prn Prior/Current Home Services: No current home services Social Drivers of Health Review: SDOH reviewed no interventions necessary Readmission risk has been reviewed: Yes Transition of care needs: no transition of care needs at this time

## 2024-03-31 ENCOUNTER — Other Ambulatory Visit: Payer: Self-pay | Admitting: Cardiology

## 2024-03-31 DIAGNOSIS — I480 Paroxysmal atrial fibrillation: Secondary | ICD-10-CM

## 2024-03-31 NOTE — Telephone Encounter (Signed)
 Prescription refill request for Eliquis  received. Indication:afib Last office visit:11/25 Scr: 1.23  11/25 Age:78 Weight:82.1  kg  Prescription refilled

## 2024-04-01 LAB — FLECAINIDE LEVEL: Flecainide: 0.22 ug/mL (ref 0.20–1.00)

## 2024-04-22 ENCOUNTER — Ambulatory Visit: Attending: Cardiology | Admitting: Cardiology

## 2024-05-02 ENCOUNTER — Ambulatory Visit (HOSPITAL_COMMUNITY)
Admission: RE | Admit: 2024-05-02 | Discharge: 2024-05-02 | Disposition: A | Discharge status: Home / Self Care | Payer: Medicare Other | Source: Ambulatory Visit | Attending: Cardiology | Admitting: Cardiology

## 2024-05-02 DIAGNOSIS — I4819 Other persistent atrial fibrillation: Secondary | ICD-10-CM | POA: Insufficient documentation

## 2024-05-02 LAB — ECHOCARDIOGRAM COMPLETE: S' Lateral: 3.1 cm

## 2024-05-09 ENCOUNTER — Ambulatory Visit: Payer: Self-pay | Admitting: Cardiology

## 2024-05-09 NOTE — Telephone Encounter (Signed)
 The patient's son has been notified of the result and verbalized understanding.  All questions (if any) were answered. Collyn Selk Chauvigne, RN 05/09/2024 4:40 PM

## 2024-06-29 ENCOUNTER — Ambulatory Visit: Admitting: Cardiology

## 2024-09-19 ENCOUNTER — Ambulatory Visit (HOSPITAL_COMMUNITY): Admitting: Internal Medicine
